# Patient Record
Sex: Male | Born: 1950 | Race: Black or African American | Hispanic: No | Smoking: Former smoker
Health system: Southern US, Community
[De-identification: ages and names within clinical notes are randomized; demographics above are authoritative.]

## PROBLEM LIST (undated history)

## (undated) DIAGNOSIS — D649 Anemia, unspecified: Secondary | ICD-10-CM

## (undated) DIAGNOSIS — C3490 Malignant neoplasm of unspecified part of unspecified bronchus or lung: Secondary | ICD-10-CM

## (undated) DIAGNOSIS — Z87891 Personal history of nicotine dependence: Secondary | ICD-10-CM

## (undated) DIAGNOSIS — R252 Cramp and spasm: Secondary | ICD-10-CM

## (undated) DIAGNOSIS — IMO0001 Reserved for inherently not codable concepts without codable children: Secondary | ICD-10-CM

## (undated) DIAGNOSIS — C801 Malignant (primary) neoplasm, unspecified: Secondary | ICD-10-CM

## (undated) DIAGNOSIS — C642 Malignant neoplasm of left kidney, except renal pelvis: Secondary | ICD-10-CM

## (undated) DIAGNOSIS — E1129 Type 2 diabetes mellitus with other diabetic kidney complication: Secondary | ICD-10-CM

## (undated) DIAGNOSIS — N183 Chronic kidney disease, stage 3 unspecified: Secondary | ICD-10-CM

## (undated) DIAGNOSIS — F1011 Alcohol abuse, in remission: Secondary | ICD-10-CM

## (undated) DIAGNOSIS — I10 Essential (primary) hypertension: Secondary | ICD-10-CM

## (undated) DIAGNOSIS — I499 Cardiac arrhythmia, unspecified: Secondary | ICD-10-CM

## (undated) DIAGNOSIS — E213 Hyperparathyroidism, unspecified: Secondary | ICD-10-CM

## (undated) DIAGNOSIS — R918 Other nonspecific abnormal finding of lung field: Secondary | ICD-10-CM

## (undated) DIAGNOSIS — E119 Type 2 diabetes mellitus without complications: Secondary | ICD-10-CM

## (undated) DIAGNOSIS — N529 Male erectile dysfunction, unspecified: Secondary | ICD-10-CM

## (undated) HISTORY — DX: Chronic kidney disease, stage 3 (moderate): N18.3

## (undated) HISTORY — DX: Other nonspecific abnormal finding of lung field: R91.8

## (undated) HISTORY — DX: Chronic kidney disease, stage 3 unspecified: N18.30

## (undated) HISTORY — DX: Type 2 diabetes mellitus with other diabetic kidney complication: E11.29

## (undated) HISTORY — PX: OTHER SURGICAL HISTORY: SHX169

## (undated) HISTORY — DX: Male erectile dysfunction, unspecified: N52.9

## (undated) HISTORY — DX: Type 2 diabetes mellitus without complications: E11.9

## (undated) HISTORY — DX: Alcohol abuse, in remission: F10.11

## (undated) HISTORY — DX: Essential (primary) hypertension: I10

## (undated) HISTORY — PX: ARM WOUND REPAIR / CLOSURE: SUR1141

## (undated) HISTORY — DX: Personal history of nicotine dependence: Z87.891

---

## 2003-01-07 ENCOUNTER — Emergency Department (HOSPITAL_COMMUNITY): Admission: EM | Admit: 2003-01-07 | Discharge: 2003-01-07 | Payer: Self-pay | Admitting: Emergency Medicine

## 2003-01-07 ENCOUNTER — Encounter: Payer: Self-pay | Admitting: Emergency Medicine

## 2003-04-16 ENCOUNTER — Encounter: Admission: RE | Admit: 2003-04-16 | Discharge: 2003-07-15 | Payer: Self-pay | Admitting: *Deleted

## 2003-05-08 ENCOUNTER — Ambulatory Visit (HOSPITAL_COMMUNITY): Admission: RE | Admit: 2003-05-08 | Discharge: 2003-05-08 | Payer: Self-pay | Admitting: *Deleted

## 2005-04-09 ENCOUNTER — Ambulatory Visit (HOSPITAL_COMMUNITY): Admission: RE | Admit: 2005-04-09 | Discharge: 2005-04-09 | Payer: Self-pay | Admitting: Gastroenterology

## 2005-04-09 LAB — HM COLONOSCOPY

## 2007-11-08 ENCOUNTER — Ambulatory Visit: Payer: Self-pay

## 2010-11-20 NOTE — Op Note (Signed)
NAME:  [name] [name], [name] [name]               ACCOUNT NO.:  [id number]   MEDICAL RECORD NO.:  [id number]          PATIENT TYPE:  AMB   LOCATION:  ENDO                         FACILITY:  [facility]   PHYSICIAN:  Nelwyn Salisbury, M.D.  DATE OF BIRTH:  [birth year]   DATE OF PROCEDURE:  04/09/2005  DATE OF DISCHARGE:                                 OPERATIVE REPORT   PROCEDURE PERFORMED:  Screening colonoscopy.   ENDOSCOPIST:  Nelwyn Salisbury, M.D.   INSTRUMENT USED:  Olympus video colonoscope.   INDICATIONS FOR PROCEDURE:  A 60 year old [place] American male with a  history of colon cancer in his father undergoing a screening colonoscopy,  rule out colonic polyps, masses, etc.   PREPROCEDURE PREPARATION:  Informed consent was procured from the patient.  The patient was fasted for eight hours prior to the procedure and prepped  with a bottle of magnesium citrate and a gallon of GoLYTELY the night prior  to the procedure.  Risks and benefits of the procedure including a 10% miss  rate of cancer and polyps was discussed with the patient as well.   PREPROCEDURE PHYSICAL:  VITAL SIGNS:  Stable vital signs.  NECK:  Supple.  CHEST:  Clear to auscultation.  CARDIOVASCULAR:  S1 and S2 regular.  ABDOMEN:  Soft with normal bowel sounds.   DESCRIPTION OF PROCEDURE:  The patient was placed in left lateral decubitus  position, sedated with 70 mg of Demerol and 10 mg of Versed in slow  incremental doses.  Once the patient was adequately sedated and maintained  on low flow oxygen and continuous cardiac monitoring, the Olympus video  colonoscope was advanced from the rectum to the cecum.  The appendiceal  orifice and ileocecal valve were visualized and photographed.  The patient  had a somewhat tortuous colon.  The patient's position was changed from the  left lateral to supine and the right lateral position.  With gentle  application of abdominal pressure, we reached the cecal base.  No other  masses, polyps,  erosions or ulcerations or diverticula  were seen.  Retroflexion in the rectum revealed no abnormalities.   IMPRESSION:  Normal colonoscopy up to the cecum.  No masses, polyps or  diverticula seen.   RECOMMENDATIONS:  1.  Continue high fiber diet with liberal fluid intake.  2.  Repeat colonoscopy in the next five years unless the patient develops      any abnormal symptoms in the interim.  3.  Outpatient follow-up as need arises in the future.      Nelwyn Salisbury, M.D.  Electronically Signed     JNM/MEDQ  D:  04/09/2005  T:  04/09/2005  Job:  [id number]   cc:   Early Chars. Waymon Amato, M.D.  Fax: [phone]

## 2011-04-13 LAB — LIPID PANEL
LDL Cholesterol: 78 mg/dL
Triglycerides: 134 mg/dL (ref 40–160)

## 2011-04-13 LAB — BASIC METABOLIC PANEL: Glucose: 148 mg/dL

## 2011-07-18 ENCOUNTER — Encounter: Payer: Self-pay | Admitting: Physician Assistant

## 2011-07-18 DIAGNOSIS — N529 Male erectile dysfunction, unspecified: Secondary | ICD-10-CM | POA: Insufficient documentation

## 2011-07-18 DIAGNOSIS — E1129 Type 2 diabetes mellitus with other diabetic kidney complication: Secondary | ICD-10-CM | POA: Insufficient documentation

## 2011-07-18 DIAGNOSIS — N183 Chronic kidney disease, stage 3 unspecified: Secondary | ICD-10-CM | POA: Insufficient documentation

## 2011-07-18 DIAGNOSIS — Z87891 Personal history of nicotine dependence: Secondary | ICD-10-CM | POA: Insufficient documentation

## 2011-08-03 ENCOUNTER — Other Ambulatory Visit: Payer: Self-pay | Admitting: Physician Assistant

## 2011-08-03 ENCOUNTER — Ambulatory Visit (INDEPENDENT_AMBULATORY_CARE_PROVIDER_SITE_OTHER): Payer: 59 | Admitting: Physician Assistant

## 2011-08-03 DIAGNOSIS — N183 Chronic kidney disease, stage 3 unspecified: Secondary | ICD-10-CM

## 2011-08-03 DIAGNOSIS — E1129 Type 2 diabetes mellitus with other diabetic kidney complication: Secondary | ICD-10-CM

## 2011-08-03 DIAGNOSIS — IMO0001 Reserved for inherently not codable concepts without codable children: Secondary | ICD-10-CM

## 2011-08-03 DIAGNOSIS — I10 Essential (primary) hypertension: Secondary | ICD-10-CM

## 2011-08-03 DIAGNOSIS — E782 Mixed hyperlipidemia: Secondary | ICD-10-CM

## 2011-08-03 LAB — COMPREHENSIVE METABOLIC PANEL
ALT: 20 U/L (ref 0–53)
AST: 20 U/L (ref 0–37)
Albumin: 4.4 g/dL (ref 3.5–5.2)
BUN: 27 mg/dL — ABNORMAL HIGH (ref 6–23)
CO2: 24 mEq/L (ref 19–32)
Calcium: 9.6 mg/dL (ref 8.4–10.5)
Chloride: 104 mEq/L (ref 96–112)
Potassium: 4.6 mEq/L (ref 3.5–5.3)

## 2011-08-03 LAB — LIPID PANEL
LDL Cholesterol: 91 mg/dL (ref 0–99)
VLDL: 26 mg/dL (ref 0–40)

## 2011-08-03 LAB — GLUCOSE, POCT (MANUAL RESULT ENTRY): POC Glucose: 209

## 2011-08-03 MED ORDER — SITAGLIPTIN PHOSPHATE 50 MG PO TABS: 50.0000 mg | ORAL_TABLET | Freq: Every day | ORAL | Status: DC

## 2011-08-03 MED ORDER — HYDROCHLOROTHIAZIDE 25 MG PO TABS: 25.0000 mg | ORAL_TABLET | Freq: Every day | ORAL | Status: DC

## 2011-08-03 MED ORDER — PRAVASTATIN SODIUM 40 MG PO TABS: 40.0000 mg | ORAL_TABLET | Freq: Every evening | ORAL | Status: DC

## 2011-08-03 MED ORDER — OLMESARTAN MEDOXOMIL 40 MG PO TABS: 40.0000 mg | ORAL_TABLET | Freq: Every day | ORAL | Status: DC

## 2011-08-03 MED ORDER — GLIPIZIDE ER 10 MG PO TB24: 10.0000 mg | ORAL_TABLET | Freq: Every day | ORAL | Status: DC

## 2011-08-03 MED ORDER — AMLODIPINE BESYLATE 10 MG PO TABS: 10.0000 mg | ORAL_TABLET | Freq: Every day | ORAL | Status: DC

## 2011-08-03 NOTE — Assessment & Plan Note (Signed)
A1C is above goal.  Once remaining lab results are available, will adjust regimen.  Given his current kidney function, he is on maximum oral agents.  We'll need to initiate insulin.  Plan: Lantus 10 u Cobb QPM, increasing by 2 units daily to achieve fasting glucose <140.

## 2011-08-03 NOTE — Assessment & Plan Note (Signed)
Needs improved control. Increase Benicar to 40 mg.

## 2011-08-03 NOTE — Assessment & Plan Note (Signed)
Continue efforts for optimal hypertension and diabetes control.

## 2011-08-03 NOTE — Progress Notes (Signed)
  Subjective:    [name], male    DOB: 1951/01/09, 61 y.o.   MRN: [id number]  Diabetes He presents for his follow-up diabetic visit. He has type 2 diabetes mellitus. No MedicAlert identification noted. His disease course has been improving. Hypoglycemia symptoms include sweats. Pertinent negatives for hypoglycemia include no confusion, dizziness, headaches, hunger, mood changes, nervousness/anxiousness, pallor, seizures, sleepiness, speech difficulty or tremors. There are no diabetic associated symptoms. There are no hypoglycemic complications. Symptoms are stable. Diabetic complications include impotence and nephropathy. Pertinent negatives for diabetic complications include no peripheral neuropathy or retinopathy. Risk factors for coronary artery disease include diabetes mellitus, dyslipidemia, hypertension, male sex, tobacco exposure and family history. Current diabetic treatment includes diet and oral agent (dual therapy). He is compliant with treatment most of the time. His weight is stable. When asked about meal planning, he reported none. He has not had a previous visit with a dietician. He never participates in exercise. There is no change in his home blood glucose trend. An ACE inhibitor/angiotensin II receptor blocker is being taken. He does not see a podiatrist.Eye exam is not current ("I'll have that done next time").  Does not check blood sugar at home. Home blood pressures are [id number] systolic at home. Has seen a dentist.  Planning multiple dental extractions.   Review of Systems  Constitutional: Negative.   HENT: Negative.   Eyes: Negative.   Respiratory: Negative.   Cardiovascular: Negative.   Gastrointestinal: Negative.   Genitourinary: Positive for impotence.  Musculoskeletal: Negative.   Skin: Negative for pallor.  Neurological: Negative for dizziness, tremors, seizures, speech difficulty and headaches.  Hematological: Negative.   Psychiatric/Behavioral:  Negative for confusion. The patient is not nervous/anxious.        Objective:   Physical Exam  Constitutional: He is oriented to person, place, and time. He appears well-developed and well-nourished. No distress.  HENT:  Head: Normocephalic and atraumatic.  Right Ear: External ear normal.  Left Ear: External ear normal.  Nose: Nose normal.  Mouth/Throat: Oropharynx is clear and moist. No oropharyngeal exudate.  Eyes: Conjunctivae and EOM are normal. Pupils are equal, round, and reactive to light. Right eye exhibits discharge. Left eye exhibits no discharge. No scleral icterus.  Neck: Normal range of motion. Neck supple. No thyromegaly present.  Cardiovascular: Normal rate, regular rhythm, normal heart sounds and intact distal pulses.  Exam reveals no gallop and no friction rub.   No murmur heard. Pulmonary/Chest: Effort normal and breath sounds normal. No respiratory distress. He has no wheezes. He has no rales. He exhibits no tenderness.  Musculoskeletal: He exhibits no edema.  Lymphadenopathy:    He has no cervical adenopathy.  Neurological: He is alert and oriented to person, place, and time. A sensory deficit (reduced sensation to monofilament RIGHT heel) is present.  Skin: Skin is warm and dry. He is not diaphoretic.  Psychiatric: He has a normal mood and affect. His behavior is normal.          Assessment & Plan:

## 2011-08-03 NOTE — Patient Instructions (Signed)
We need your blood pressure to be 120-140 or less (the top number) even at home, so we're increasing the dose of the Benicar from 20 mg to 40 mg.

## 2011-08-05 ENCOUNTER — Encounter: Payer: Self-pay | Admitting: Physician Assistant

## 2011-09-26 ENCOUNTER — Other Ambulatory Visit: Payer: Self-pay | Admitting: Physician Assistant

## 2011-10-26 ENCOUNTER — Ambulatory Visit (INDEPENDENT_AMBULATORY_CARE_PROVIDER_SITE_OTHER): Payer: 59 | Admitting: Physician Assistant

## 2011-10-26 ENCOUNTER — Encounter: Payer: Self-pay | Admitting: Physician Assistant

## 2011-10-26 VITALS — BP 168/78 | HR 72 | Temp 98.4°F | Resp 16 | Ht 70.0 in | Wt 168.8 lb

## 2011-10-26 DIAGNOSIS — E119 Type 2 diabetes mellitus without complications: Secondary | ICD-10-CM

## 2011-10-26 DIAGNOSIS — E1129 Type 2 diabetes mellitus with other diabetic kidney complication: Secondary | ICD-10-CM

## 2011-10-26 DIAGNOSIS — E785 Hyperlipidemia, unspecified: Secondary | ICD-10-CM

## 2011-10-26 DIAGNOSIS — I10 Essential (primary) hypertension: Secondary | ICD-10-CM

## 2011-10-26 DIAGNOSIS — N183 Chronic kidney disease, stage 3 unspecified: Secondary | ICD-10-CM

## 2011-10-26 LAB — COMPREHENSIVE METABOLIC PANEL WITH GFR
ALT: 18 U/L (ref 0–53)
AST: 24 U/L (ref 0–37)
Albumin: 4.2 g/dL (ref 3.5–5.2)
Alkaline Phosphatase: 55 U/L (ref 39–117)
BUN: 20 mg/dL (ref 6–23)
CO2: 24 meq/L (ref 19–32)
Calcium: 9.3 mg/dL (ref 8.4–10.5)
Chloride: 106 meq/L (ref 96–112)
Creat: 1.79 mg/dL — ABNORMAL HIGH (ref 0.50–1.35)
Glucose, Bld: 115 mg/dL — ABNORMAL HIGH (ref 70–99)
Potassium: 4.5 meq/L (ref 3.5–5.3)
Sodium: 138 meq/L (ref 135–145)
Total Bilirubin: 0.5 mg/dL (ref 0.3–1.2)
Total Protein: 7.2 g/dL (ref 6.0–8.3)

## 2011-10-26 LAB — LIPID PANEL
Cholesterol: 179 mg/dL (ref 0–200)
HDL: 35 mg/dL — ABNORMAL LOW (ref >39)
LDL Cholesterol: 125 mg/dL — ABNORMAL HIGH (ref 0–99)
Total CHOL/HDL Ratio: 5.1 ratio
Triglycerides: 97 mg/dL (ref <150)
VLDL: 19 mg/dL (ref 0–40)

## 2011-10-26 MED ORDER — PRAVASTATIN SODIUM 40 MG PO TABS: 40.0000 mg | ORAL_TABLET | Freq: Every evening | ORAL | Status: DC

## 2011-10-26 MED ORDER — OLMESARTAN MEDOXOMIL 40 MG PO TABS: 40.0000 mg | ORAL_TABLET | Freq: Every day | ORAL | Status: DC

## 2011-10-26 MED ORDER — HYDROCHLOROTHIAZIDE 25 MG PO TABS: 25.0000 mg | ORAL_TABLET | Freq: Every day | ORAL | Status: DC

## 2011-10-26 MED ORDER — GLUCOSE BLOOD VI STRP: ORAL_STRIP | Status: DC

## 2011-10-26 MED ORDER — AMLODIPINE BESYLATE 10 MG PO TABS: 10.0000 mg | ORAL_TABLET | Freq: Every day | ORAL | Status: DC

## 2011-10-26 NOTE — Assessment & Plan Note (Signed)
BP above goal today, likely due to being out of benicar x 3 months.  Stressed the importance of staying on his meds, even if he needs to call for a refill between visits.

## 2011-10-26 NOTE — Assessment & Plan Note (Signed)
Continue current treatment and plan, with stress again on good control of glucose and blood pressure. Continue evaluation with Dr. Moshe Cipro per her instructions.

## 2011-10-26 NOTE — Patient Instructions (Addendum)
Remember: You are what you eat, so don't be fast, cheap, easy or fake. Continue checking your blood sugar.  If it rises (above 140 fasting, or above 170 2-3 hours after your largest meal of the day, we'll restart a medication). You need an eye examination and a dental evaluation.  Please do those before you next visit with me.

## 2011-10-26 NOTE — Assessment & Plan Note (Addendum)
Well controlled.  He's been diligent about checking glucose at home.  Mostly fasting.  All readings less than 140, even during a two week period when he stopped both glipizide and Januvia!  He's been making extra efforts to eat healthier options and has lost weight.  Congratulated on the efforts and encouraged him to continue.  D/C glipizide and Januvia.  Continue to monitor glucose.  If it rises, we'll restart meds.  He does not want to restart Januvia, due to recent reports of increased cancer risk. He is reminded to see an eye specialist and a dentist before his next visit with me.

## 2011-10-26 NOTE — Progress Notes (Signed)
  Subjective:    [name], male    DOB: [birth year], 61 y.o.   MRN: [id number]  HPI  Presents for re-evaluation of DM type 2, HTN, elevated lipids, renal failure.  Has embarked on healthier eating and has controlled his blood sugar with it.  Even tried 2 weeks off the Januvia and Glipizide, with all fastings readings below 140!  He'd like to D/C the Januvia due to news reports of increased cancer risk.  BS QOAM. Less than 140. Last eye exam 4-5 years ago. Last dental exam about one year ago.   Review of Systems No chest pain, SOB, HA, dizziness, vision change, N/V, diarrhea, dysuria, myalgias, arthralgias or rash.     Objective:   Physical Exam        Assessment & Plan:

## 2011-10-27 LAB — MICROALBUMIN, URINE: Microalb, Ur: 11.09 mg/dL — ABNORMAL HIGH (ref 0.00–1.89)

## 2011-10-28 ENCOUNTER — Encounter: Payer: Self-pay | Admitting: Physician Assistant

## 2011-10-29 ENCOUNTER — Telehealth: Payer: Self-pay

## 2011-10-29 DIAGNOSIS — E119 Type 2 diabetes mellitus without complications: Secondary | ICD-10-CM

## 2011-10-29 NOTE — Telephone Encounter (Signed)
Please see the order sent yesterday.  Please resend or call in.

## 2011-10-29 NOTE — Telephone Encounter (Signed)
Pt said that [facility] did not get the RX for the glucose test strips. Please re-send.

## 2011-10-30 MED ORDER — GLUCOSE BLOOD VI STRP: ORAL_STRIP | Status: AC

## 2011-10-30 NOTE — Telephone Encounter (Signed)
Resent

## 2012-01-16 ENCOUNTER — Other Ambulatory Visit: Payer: Self-pay | Admitting: Physician Assistant

## 2012-02-01 ENCOUNTER — Encounter: Payer: Self-pay | Admitting: Physician Assistant

## 2012-02-01 ENCOUNTER — Ambulatory Visit (INDEPENDENT_AMBULATORY_CARE_PROVIDER_SITE_OTHER): Payer: 59 | Admitting: Physician Assistant

## 2012-02-01 VITALS — BP 140/70 | HR 79 | Temp 99.7°F | Resp 16 | Ht 70.0 in | Wt 167.2 lb

## 2012-02-01 DIAGNOSIS — N183 Chronic kidney disease, stage 3 unspecified: Secondary | ICD-10-CM

## 2012-02-01 DIAGNOSIS — E785 Hyperlipidemia, unspecified: Secondary | ICD-10-CM

## 2012-02-01 DIAGNOSIS — I10 Essential (primary) hypertension: Secondary | ICD-10-CM

## 2012-02-01 DIAGNOSIS — E782 Mixed hyperlipidemia: Secondary | ICD-10-CM

## 2012-02-01 DIAGNOSIS — L03811 Cellulitis of head [any part, except face]: Secondary | ICD-10-CM

## 2012-02-01 DIAGNOSIS — L03818 Cellulitis of other sites: Secondary | ICD-10-CM

## 2012-02-01 DIAGNOSIS — E1129 Type 2 diabetes mellitus with other diabetic kidney complication: Secondary | ICD-10-CM

## 2012-02-01 DIAGNOSIS — L02818 Cutaneous abscess of other sites: Secondary | ICD-10-CM

## 2012-02-01 LAB — POCT CBC
Granulocyte percent: 76.3 %G (ref 37–80)
Lymph, poc: 3.1 (ref 0.6–3.4)
MCH, POC: 27.3 pg (ref 27–31.2)
MCHC: 30.9 g/dL — AB (ref 31.8–35.4)
MCV: 88.4 fL (ref 80–97)
MID (cbc): 0.8 (ref 0–0.9)
POC MID %: 5 %M (ref 0–12)
Platelet Count, POC: 306 10*3/uL (ref 142–424)
RBC: 5.12 M/uL (ref 4.69–6.13)
RDW, POC: 14 %
WBC: 16.5 10*3/uL — AB (ref 4.6–10.2)

## 2012-02-01 LAB — COMPREHENSIVE METABOLIC PANEL
AST: 23 U/L (ref 0–37)
BUN: 36 mg/dL — ABNORMAL HIGH (ref 6–23)
CO2: 26 mEq/L (ref 19–32)
Calcium: 9.4 mg/dL (ref 8.4–10.5)
Chloride: 104 mEq/L (ref 96–112)
Creat: 2.03 mg/dL — ABNORMAL HIGH (ref 0.50–1.35)
Total Bilirubin: 0.6 mg/dL (ref 0.3–1.2)

## 2012-02-01 LAB — LIPID PANEL: LDL Cholesterol: 79 mg/dL (ref 0–99)

## 2012-02-01 LAB — POCT GLYCOSYLATED HEMOGLOBIN (HGB A1C): Hemoglobin A1C: 7.9

## 2012-02-01 MED ORDER — AMLODIPINE BESYLATE 10 MG PO TABS: 10.0000 mg | ORAL_TABLET | Freq: Every day | ORAL | Status: DC

## 2012-02-01 MED ORDER — HYDROCHLOROTHIAZIDE 25 MG PO TABS: 25.0000 mg | ORAL_TABLET | Freq: Every day | ORAL | Status: DC

## 2012-02-01 MED ORDER — PRAVASTATIN SODIUM 40 MG PO TABS: 40.0000 mg | ORAL_TABLET | Freq: Every day | ORAL | Status: DC

## 2012-02-01 MED ORDER — DOXYCYCLINE HYCLATE 100 MG PO CAPS: 100.0000 mg | ORAL_CAPSULE | Freq: Two times a day (BID) | ORAL | Status: AC

## 2012-02-01 MED ORDER — OLMESARTAN MEDOXOMIL 40 MG PO TABS: 40.0000 mg | ORAL_TABLET | Freq: Every day | ORAL | Status: DC

## 2012-02-01 NOTE — Progress Notes (Signed)
Subjective:    [name], male    DOB: 02-05-1951, 61 y.o.   MRN: [id number]  HPI  61 y.o. year old male presents for evaluation of diabetes type 2.  Prior to Admission medications   Medication Sig Start Date End Date Taking? Authorizing Provider  amLODipine (NORVASC) 10 MG tablet TAKE 1 TABLET DAILY [id number]  Yes Beatriz Chancellor, PA-C  aspirin 81 MG tablet Take 81 mg by mouth daily.   Yes Historical Provider, MD  BENICAR 40 MG tablet TAKE 1 TABLET DAILY [id number]  Yes Beatriz Chancellor, PA-C  fish oil-omega-3 fatty acids 1000 MG capsule Take 1 g by mouth daily.   Yes Historical Provider, MD  glucose blood test strip Use as instructed 10/30/11 10/29/12 Yes Willem Klingensmith S Griffith Santilli, PA-C  hydrochlorothiazide (HYDRODIURIL) 25 MG tablet Take 1 tablet (25 mg total) by mouth daily. 10/26/11  Yes Tammela Bales S Asaf Elmquist, PA-C  pravastatin (PRAVACHOL) 40 MG tablet TAKE 1 TABLET EVERY EVENING [id number]  Yes Beatriz Chancellor, PA-C    Allergies  Allergen Reactions  . Ace Inhibitors Hives    Checks home glucose QD-BID. Fasting glucose ranges from 150 to170. Post-prandial glucose: Doesn't check does experience symptoms with hypoglycemia. does perform daily foot exam. Last dental visit was last year. Last eye exam was "years ago."  It's on his list to schedule. has not received a pneumococcal vaccine. (repeatedly refuses despite urging) is current with influenza vaccination.   Review of Systems  Denies chest pain, shortness of breath, HA, dizziness, vision change, nausea, vomiting, diarrhea, constipation, melena, hematochezia, dysuria, increased urinary urgency or frequency, increased hunger or thirst, unintentional weight change, unexplained myalgias or arthralgias.  Past Medical History  Diagnosis Date  . Essential hypertension, benign   . Type II or unspecified type diabetes mellitus with renal manifestations, not stated as uncontrolled   . Chronic kidney disease, stage III (moderate)   .  History of tobacco abuse     quit 12/2008  . History of ETOH abuse     quit 1999  . Erectile dysfunction     Past Surgical History  Procedure Date  . Arm wound repair / closure     chainsaw; left forearm/wrist    Prior to Admission medications   Medication Sig Start Date End Date Taking? Authorizing Provider  amLODipine (NORVASC) 10 MG tablet TAKE 1 TABLET DAILY [id number]  Yes Beatriz Chancellor, PA-C  aspirin 81 MG tablet Take 81 mg by mouth daily.   Yes Historical Provider, MD  BENICAR 40 MG tablet TAKE 1 TABLET DAILY [id number]  Yes Beatriz Chancellor, PA-C  fish oil-omega-3 fatty acids 1000 MG capsule Take 1 g by mouth daily.   Yes Historical Provider, MD  glucose blood test strip Use as instructed 10/30/11 10/29/12 Yes [name], PA-C  hydrochlorothiazide (HYDRODIURIL) 25 MG tablet Take 1 tablet (25 mg total) by mouth daily. 10/26/11  Yes [name], PA-C  pravastatin (PRAVACHOL) 40 MG tablet TAKE 1 TABLET EVERY EVENING [id number]  Yes Beatriz Chancellor, PA-C    Allergies  Allergen Reactions  . Ace Inhibitors Hives    History   Social History  . Marital Status: Married    Spouse Name: [name]    Number of Children: 2  . Years of Education: 11   Occupational History  . retired     Location manager   Social History Main Topics  . Smoking status: Former Smoker -- 1.0 packs/day for 45 years  Types: Cigarettes    Quit date: 01/01/2009  . Smokeless tobacco: Not on file  . Alcohol Use: No  . Drug Use: No  . Sexually Active: Yes    Family History  Problem Relation Age of Onset  . Cancer Father   . Cancer Sister   . Stroke Brother   . Stroke Brother        Objective:   Physical Exam Blood pressure 140/70, pulse 79, temperature 99.7 F (37.6 C), temperature source Oral, resp. rate 16, height 5\' 10"  (1.778 m), weight 167 lb 3.2 oz (75.841 kg), SpO2 99.00%. Body mass index is 23.99 kg/(m^2). Well-developed, well nourished BM who is awake, alert and  oriented, in NAD. HEENT: [place]/AT, PERRL, EOMI.  Sclera and conjunctiva are clear.  EAC are patent, TMs are normal in appearance. Nasal mucosa is pink and moist. OP is clear. Neck: supple, non-tender, thyromegaly. Heart: RRR, no murmur Lungs: CTA Abdomen: normo-active bowel sounds, supple, non-tender, no mass or organomegaly. Extremities: no cyanosis, clubbing or edema. Skin: warm and dry.  Soft tissue swelling and mild erythema posterior left scalp.  Central excoriation, scant bloody drainage.  Very tender on palpation.  Left occipital lymph node palpable and tender.  Results for orders placed in visit on 02/01/12  GLUCOSE, POCT (MANUAL RESULT ENTRY)      Component Value Range   POC Glucose 154 (*) 70 - 99 mg/dl  POCT GLYCOSYLATED HEMOGLOBIN (HGB A1C)      Component Value Range   Hemoglobin A1C 7.9    POCT CBC      Component Value Range   WBC 16.5 (*) 4.6 - 10.2 K/uL   Lymph, poc 3.1  0.6 - 3.4   POC LYMPH PERCENT 18.7  10 - 50 %L   MID (cbc) 0.8  0 - 0.9   POC MID % 5.0  0 - 12 %M   POC Granulocyte 12.6 (*) 2 - 6.9   Granulocyte percent 76.3  37 - 80 %G   RBC 5.12  4.69 - 6.13 M/uL   Hemoglobin 14.0 (*) 14.1 - 18.1 g/dL   HCT, POC 45.3  43.5 - 53.7 %   MCV 88.4  80 - 97 fL   MCH, POC 27.3  27 - 31.2 pg   MCHC 30.9 (*) 31.8 - 35.4 g/dL   RDW, POC 14.0     Platelet Count, POC 306  142 - 424 K/uL   MPV 9.3  0 - 99.8 fL         Assessment & Plan:   1. Abscess or cellulitis of scalp  POCT CBC, doxycycline (VIBRAMYCIN) 100 MG capsule, Wound culture  2. DM (diabetes mellitus) type II controlled with renal manifestation  POCT glucose (manual entry), POCT glycosylated hemoglobin (Hb A1C)  3. HTN, goal below 130/80  olmesartan (BENICAR) 40 MG tablet, amLODipine (NORVASC) 10 MG tablet  5. Hyperlipidemia LDL goal < 70  pravastatin (PRAVACHOL) 40 MG tablet  6. Essential hypertension, benign  pravastatin (PRAVACHOL) 40 MG tablet, hydrochlorothiazide (HYDRODIURIL) 25 MG tablet,  Comprehensive metabolic panel  7. Mixed hyperlipidemia  Comprehensive metabolic panel, Lipid panel  8. CKD (chronic kidney disease) stage 3, GFR 30-59 ml/min  Comprehensive metabolic panel   Patient Instructions  Check your blood sugar once or twice daily.  Please collect some readings before you've had anything to eat or drink, and some readings 2-3 hours after your largest meal of the day.  Apply warm compresses to the wound on the back of your head.  If you are not having significant improvement in 48 hours, or if you are not completely well after completing the antibiotic, return for additional evaluation.   Re-evaluate chronic problems in 3 months.  If A1C remains above 7.5% will restart glipizide.

## 2012-02-01 NOTE — Patient Instructions (Addendum)
Check your blood sugar once or twice daily.  Please collect some readings before you've had anything to eat or drink, and some readings 2-3 hours after your largest meal of the day.  Apply warm compresses to the wound on the back of your head.  If you are not having significant improvement in 48 hours, or if you are not completely well after completing the antibiotic, return for additional evaluation.

## 2012-02-04 ENCOUNTER — Encounter: Payer: Self-pay | Admitting: *Deleted

## 2012-02-04 ENCOUNTER — Ambulatory Visit (INDEPENDENT_AMBULATORY_CARE_PROVIDER_SITE_OTHER): Payer: 59 | Admitting: Physician Assistant

## 2012-02-04 VITALS — BP 152/59 | HR 79 | Temp 98.2°F | Resp 16 | Ht 70.0 in | Wt 165.0 lb

## 2012-02-04 DIAGNOSIS — R51 Headache: Secondary | ICD-10-CM

## 2012-02-04 DIAGNOSIS — L723 Sebaceous cyst: Secondary | ICD-10-CM

## 2012-02-04 DIAGNOSIS — L02818 Cutaneous abscess of other sites: Secondary | ICD-10-CM

## 2012-02-04 DIAGNOSIS — L03811 Cellulitis of head [any part, except face]: Secondary | ICD-10-CM

## 2012-02-04 DIAGNOSIS — L03818 Cellulitis of other sites: Secondary | ICD-10-CM

## 2012-02-04 LAB — WOUND CULTURE
Gram Stain: NONE SEEN
Gram Stain: NONE SEEN

## 2012-02-04 MED ORDER — HYDROCODONE-ACETAMINOPHEN 5-325 MG PO TABS: 1.0000 | ORAL_TABLET | Freq: Four times a day (QID) | ORAL | Status: AC | PRN

## 2012-02-04 NOTE — Progress Notes (Signed)
  Subjective:    [name], male    DOB: 04/18/1951, 61 y.o.   MRN: [id number]  HPI  This 61 y.o. Male presents for evaluation of a cellulitis on the back of his scalp.  02/01/2012 at his appointment for routine follow-up he was also evaluated for this problem.  He had a small crusted pustule, but minimal fluid could be expressed.  He was started on Doxycycline and warm compresses.  He returns today because while his head and scalp pain is improved, he was developed another, larger pustule and the area of swelling on the back of his head is larger.  He's tolerating the doxycycline.  No fever, chills.   Review of Systems As above.    Objective:   Physical Exam Vitals noted.  He is A&Ox3.  Posterior scalp with swelling and significant tenderness surrounding a cluster of pustules, one of which was unroofed 4 days ago, and a larger one superior to it.  He has occipital and left cervical lymphadenopathy.  After cleansing with alcohol, the pustules were unroofed and purulence was expressed from the original site as well as the larger lesion above it.  Additionally, sebaceous material was expressed from the lower site.  Both were irrigated with 2% lidocaine plain, which revealed communication between the two lesions. A scant amount of 1/4 inch packing was placed in the lower wound and the entire area covered with mupirocin ointment and covered with a dressing.      Assessment & Plan:   1. Sebaceous cyst  Wound culture  2. Headache  Wound culture, HYDROcodone-acetaminophen (NORCO) 5-325 MG per tablet  3. Abscess or cellulitis of scalp     Patient Instructions  Continue with the warm compresses for 20 minutes 3-4 times each day. Continue the Doxycycline. Use the pain medication as needed.   RTC for wound care tomorrow with Windell Hummingbird, PA-C

## 2012-02-04 NOTE — Patient Instructions (Signed)
Continue with the warm compresses for 20 minutes 3-4 times each day. Continue the Doxycycline. Use the pain medication as needed.

## 2012-02-05 ENCOUNTER — Ambulatory Visit (INDEPENDENT_AMBULATORY_CARE_PROVIDER_SITE_OTHER): Payer: 59 | Admitting: Physician Assistant

## 2012-02-05 VITALS — BP 118/60 | HR 88 | Temp 98.7°F | Resp 16 | Ht 70.0 in | Wt 165.0 lb

## 2012-02-05 DIAGNOSIS — L03811 Cellulitis of head [any part, except face]: Secondary | ICD-10-CM

## 2012-02-05 DIAGNOSIS — IMO0001 Reserved for inherently not codable concepts without codable children: Secondary | ICD-10-CM

## 2012-02-05 DIAGNOSIS — L03818 Cellulitis of other sites: Secondary | ICD-10-CM

## 2012-02-05 DIAGNOSIS — L02818 Cutaneous abscess of other sites: Secondary | ICD-10-CM

## 2012-02-05 DIAGNOSIS — E119 Type 2 diabetes mellitus without complications: Secondary | ICD-10-CM

## 2012-02-05 NOTE — Progress Notes (Signed)
  Subjective:    [name], male    DOB: 01/20/1951, 61 y.o.   MRN: [id number]  HPI Pt presents to clinic for recheck.  His wound is sore and he had trouble sleeping because of the pain.  He is tolerating the abx ok.  He has not changed the drsg.  Pt is not checking his glucose at home.   Review of Systems     Objective:   Physical Exam  Constitutional: He is oriented to person, place, and time. He appears well-developed and well-nourished.  HENT:  Head: Normocephalic and atraumatic.  Right Ear: External ear normal.  Left Ear: External ear normal.  Eyes: Conjunctivae are normal.  Pulmonary/Chest: Effort normal.  Neurological: He is alert and oriented to person, place, and time.  Skin: Skin is warm and dry.     Psychiatric: He has a normal mood and affect. His behavior is normal. Judgment and thought content normal.    Results for orders placed in visit on 02/05/12  GLUCOSE, POCT (MANUAL RESULT ENTRY)      Component Value Range   POC Glucose 267 (*) 70 - 99 mg/dl         Assessment & Plan:   1. Cellulitis of scalp  POCT glucose (manual entry)  2. DM (diabetes mellitus)  POCT glucose (manual entry)   1- pt to continue abx.  He is to change the drsg tomorrow and wife is to look at marked area and if area of erythema or induration is larger he is to RTC tomorrow for recheck, if the area has improved he can recheck in 2 days.  I think we need to keep close watch on this due to the location of the infection.  Pt to use warm compresses.  Pt to monitor his glucose at home because today's reading seems higher than usual for him.  Pt understood and voiced agreement with the following.

## 2012-02-07 ENCOUNTER — Ambulatory Visit (INDEPENDENT_AMBULATORY_CARE_PROVIDER_SITE_OTHER): Payer: 59 | Admitting: Physician Assistant

## 2012-02-07 VITALS — BP 138/60 | HR 79 | Temp 97.7°F | Resp 16 | Ht 71.0 in | Wt 165.0 lb

## 2012-02-07 DIAGNOSIS — L089 Local infection of the skin and subcutaneous tissue, unspecified: Secondary | ICD-10-CM

## 2012-02-07 DIAGNOSIS — L02818 Cutaneous abscess of other sites: Secondary | ICD-10-CM

## 2012-02-07 DIAGNOSIS — L03811 Cellulitis of head [any part, except face]: Secondary | ICD-10-CM

## 2012-02-07 DIAGNOSIS — R51 Headache: Secondary | ICD-10-CM

## 2012-02-07 DIAGNOSIS — L723 Sebaceous cyst: Secondary | ICD-10-CM

## 2012-02-07 DIAGNOSIS — R519 Headache, unspecified: Secondary | ICD-10-CM

## 2012-02-07 NOTE — Patient Instructions (Addendum)
Take the doxycycline WITH FOOD so it doesn't make you so sick. Continue applying a warm compress 3 times daily. Change the dressing as needed, but do not pill out the string of packing that is tucked into the wound. Return to see me on Wednesday 02/09/2012 at 1 pm

## 2012-02-07 NOTE — Progress Notes (Signed)
Subjective:    [name], male    DOB: March 30, 1951, 61 y.o.   MRN: [id number]  HPI This 61 y.o. Male presents for wound care of infected sebaceous cyst on the posterior scalp.  It appears he initially had a sebaceous cyst, as sebaceous material has been expressed from one of the openings, but the multiple other site are draining purulent material.  He's having less pain since his visit on 8/03, but is now experiencing nausea with doxycycline.  He hasn't taking this morning's dose yet. No fever, chills, HA.    Review of Systems As above.  He notes his blood sugars have been elevated, in the 200's.   Past Medical History  Diagnosis Date  . Essential hypertension, benign   . Type II or unspecified type diabetes mellitus with renal manifestations, not stated as uncontrolled   . Chronic kidney disease, stage III (moderate)   . History of tobacco abuse     quit 12/2008  . History of ETOH abuse     quit 1999  . Erectile dysfunction     Past Surgical History  Procedure Date  . Arm wound repair / closure     chainsaw; left forearm/wrist    Prior to Admission medications   Medication Sig Start Date End Date Taking? Authorizing Provider  amLODipine (NORVASC) 10 MG tablet Take 1 tablet (10 mg total) by mouth daily. 02/01/12  Yes Zakry Caso S Lando Alcalde, PA-C  aspirin 81 MG tablet Take 81 mg by mouth daily.   Yes Historical Provider, MD  doxycycline (VIBRAMYCIN) 100 MG capsule Take 1 capsule (100 mg total) by mouth 2 (two) times daily. 02/01/12 02/11/12 Yes Clemma Johnsen S Halima Fogal, PA-C  fish oil-omega-3 fatty acids 1000 MG capsule Take 1 g by mouth daily.   Yes Historical Provider, MD  glucose blood test strip Use as instructed 10/30/11 10/29/12 Yes Eytan Carrigan S Cheick Suhr, PA-C  hydrochlorothiazide (HYDRODIURIL) 25 MG tablet Take 1 tablet (25 mg total) by mouth daily. 02/01/12  Yes Philo Kurtz S Destin Kittler, PA-C  HYDROcodone-acetaminophen (NORCO) 5-325 MG per tablet Take 1 tablet by mouth every 6 (six) hours  as needed for pain. 02/04/12 02/14/12 Yes Kanisha Duba S Keyante Durio, PA-C  olmesartan (BENICAR) 40 MG tablet Take 1 tablet (40 mg total) by mouth daily. 02/01/12  Yes Bobbijo Holst S Shaarav Ripple, PA-C  pravastatin (PRAVACHOL) 40 MG tablet Take 1 tablet (40 mg total) by mouth daily. 02/01/12  Yes Shaul Trautman Janalee Dane, PA-C    Allergies  Allergen Reactions  . Ace Inhibitors Hives    History   Social History  . Marital Status: Married    Spouse Name: [name]    Number of Children: 2  . Years of Education: 11   Occupational History  . retired     Location manager   Social History Main Topics  . Smoking status: Former Smoker -- 1.0 packs/day for 45 years    Types: Cigarettes    Quit date: 01/01/2009  . Smokeless tobacco: Not on file  . Alcohol Use: No  . Drug Use: No  . Sexually Active: Yes    Family History  Problem Relation Age of Onset  . Cancer Father   . Cancer Sister   . Stroke Brother   . Stroke Brother        Objective:   Physical Exam Blood pressure 138/60, pulse 79, temperature 97.7 F (36.5 C), temperature source Oral, resp. rate 16, height 5\' 11"  (1.803 m), weight 165 lb (74.844 kg), SpO2 98.00%. Body  mass index is 23.01 kg/(m^2). Well-developed, well nourished BM who is awake, alert and oriented, in NAD. HEENT: [place]/AT, PERRL, EOMI.  Sclera and conjunctiva are clear.  Posterior scalp is remarkable for a large lump.  Dressing removed to reveal thick yellow drainage.  Once cleansed with saline and gauze, the cluster of draining wounds noted as before.  The original wound has sebaceous material at the opening, and more is expressed with gentle pressure, along with purulence from the other openings.  The area of erythema marked on 8/03 is much smaller, but there is a firm area inferior to the draining sites.   Neck: supple, non-tender, shotty lymphadenopathy, no thyromegaly.  With permission, the area of the draining pustules was anesthetized with 2 cc of 2% lidocaine and prepped.  A  15 blade was used to incise the skin between the two largest draining sites (attempted packing of these 8/02 and know to communicate).  Deep in the skin was a cavity with large purulence and a large sebaceous cyst sac.  The cavity was then irrigated and packed with 1/4 inch plain packing.  A dressing was placed with some difficulty, given the hair growth in the area.      Assessment & Plan:   1. Cellulitis of scalp   2. Scalp pain   3. Infected sebaceous cyst of skin    Patient Instructions  Take the doxycycline WITH FOOD so it doesn't make you so sick. Continue applying a warm compress 3 times daily. Change the dressing as needed, but do not pill out the string of packing that is tucked into the wound. Return to see me on Wednesday 02/09/2012 at 1 pm

## 2012-02-09 ENCOUNTER — Ambulatory Visit (INDEPENDENT_AMBULATORY_CARE_PROVIDER_SITE_OTHER): Payer: 59 | Admitting: Physician Assistant

## 2012-02-09 ENCOUNTER — Encounter: Payer: Self-pay | Admitting: Physician Assistant

## 2012-02-09 VITALS — BP 116/75 | HR 69 | Temp 98.0°F | Resp 16 | Ht 70.0 in | Wt 165.0 lb

## 2012-02-09 DIAGNOSIS — L089 Local infection of the skin and subcutaneous tissue, unspecified: Secondary | ICD-10-CM

## 2012-02-09 DIAGNOSIS — L723 Sebaceous cyst: Secondary | ICD-10-CM

## 2012-02-09 DIAGNOSIS — L03811 Cellulitis of head [any part, except face]: Secondary | ICD-10-CM

## 2012-02-09 DIAGNOSIS — L02818 Cutaneous abscess of other sites: Secondary | ICD-10-CM

## 2012-02-09 NOTE — Patient Instructions (Signed)
Continue the Doxycycline and warm compresses.  I'll see you back on Friday, between 11 am and 6 pm.

## 2012-02-09 NOTE — Progress Notes (Signed)
Subjective:    [name], male    DOB: 31-Aug-1950, 61 y.o.   MRN: [id number]  HPI This 61 y.o. Male presents for wound care of infected sebaceous cyst on the posterior scalp. Initially he had drainage from multiple sites, and I&D was performed 02/07/2012 with removal of purulence, sebaceous material and a very large cyst sac.  He's had a big reduction in his discomfort, is tolerating the doxycycline, and has had no additional fever.  Review of Systems As above.  He notes his blood sugars have been elevated, in the 200's.   Past Medical History  Diagnosis Date  . Essential hypertension, benign   . Type II or unspecified type diabetes mellitus with renal manifestations, not stated as uncontrolled   . Chronic kidney disease, stage III (moderate)   . History of tobacco abuse     quit 12/2008  . History of ETOH abuse     quit 1999  . Erectile dysfunction     Past Surgical History  Procedure Date  . Arm wound repair / closure     chainsaw; left forearm/wrist    Prior to Admission medications   Medication Sig Start Date End Date Taking? Authorizing Provider  amLODipine (NORVASC) 10 MG tablet Take 1 tablet (10 mg total) by mouth daily. 02/01/12  Yes Rorik Vespa S Jaqueline Uber, PA-C  aspirin 81 MG tablet Take 81 mg by mouth daily.   Yes Historical Provider, MD  doxycycline (VIBRAMYCIN) 100 MG capsule Take 1 capsule (100 mg total) by mouth 2 (two) times daily. 02/01/12 02/11/12 Yes Griffith Santilli S Michal Callicott, PA-C  fish oil-omega-3 fatty acids 1000 MG capsule Take 1 g by mouth daily.   Yes Historical Provider, MD  glucose blood test strip Use as instructed 10/30/11 10/29/12 Yes Gaby Harney S Annastacia Duba, PA-C  hydrochlorothiazide (HYDRODIURIL) 25 MG tablet Take 1 tablet (25 mg total) by mouth daily. 02/01/12  Yes Cheney Ewart S Searcy Miyoshi, PA-C  HYDROcodone-acetaminophen (NORCO) 5-325 MG per tablet Take 1 tablet by mouth every 6 (six) hours as needed for pain. 02/04/12 02/14/12 Yes Alante Tolan S Braylee Bosher, PA-C  olmesartan  (BENICAR) 40 MG tablet Take 1 tablet (40 mg total) by mouth daily. 02/01/12  Yes Luby Seamans S Dayani Winbush, PA-C  pravastatin (PRAVACHOL) 40 MG tablet Take 1 tablet (40 mg total) by mouth daily. 02/01/12  Yes Cordarrell Sane Janalee Dane, PA-C    Allergies  Allergen Reactions  . Ace Inhibitors Hives    History   Social History  . Marital Status: Married    Spouse Name: [name]    Number of Children: 2  . Years of Education: 11   Occupational History  . retired     Location manager   Social History Main Topics  . Smoking status: Former Smoker -- 1.0 packs/day for 45 years    Types: Cigarettes    Quit date: 01/01/2009  . Smokeless tobacco: Not on file  . Alcohol Use: No  . Drug Use: No  . Sexually Active: Yes    Family History  Problem Relation Age of Onset  . Cancer Father   . Cancer Sister   . Stroke Brother   . Stroke Brother        Objective:      Blood pressure 116/75, pulse 69, temperature 98 F (36.7 C), temperature source Oral, resp. rate 16, height 5\' 10"  (1.778 m), weight 165 lb (74.844 kg), SpO2 99.00%. Body mass index is 23.68 kg/(m^2). Well-developed, well nourished BM who is awake, alert and oriented, in  NAD.  Dressing and packing removed.  Small pieces of cyst sac material removed with pickups.  Additional purulence visible within the wound cavity, but not expressed with pressure.  Irrigated with 3 cc of 2% lidocaine plain.  Packed with 1/4 inch plain packing.  Dressed.  Assessment & Plan:   1. Abscess or cellulitis of scalp   2. Infected sebaceous cyst    Patient Instructions  Continue the Doxycycline and warm compresses.  I'll see you back on Friday, between 11 am and 6 pm.

## 2012-02-11 ENCOUNTER — Ambulatory Visit (INDEPENDENT_AMBULATORY_CARE_PROVIDER_SITE_OTHER): Payer: 59 | Admitting: Physician Assistant

## 2012-02-11 VITALS — BP 107/57 | HR 68 | Temp 98.9°F | Resp 16

## 2012-02-11 DIAGNOSIS — L03818 Cellulitis of other sites: Secondary | ICD-10-CM

## 2012-02-11 DIAGNOSIS — L723 Sebaceous cyst: Secondary | ICD-10-CM

## 2012-02-11 DIAGNOSIS — L02818 Cutaneous abscess of other sites: Secondary | ICD-10-CM

## 2012-02-11 NOTE — Progress Notes (Signed)
  Subjective:    [name], male    DOB: Aug 02, 1950, 61 y.o.   MRN: [id number]  HPI  Presents for wound care after I&D of an infected sebaceous cyst on the back of the scalp.  He's had significant improvement since the procedure 02/07/2012.  No pain, tolerating Doxy with food.  No fever. Daily dressing change at home.  Blood sugars are returning to normal.  Review of Systems As above.    Objective:   Physical Exam  Blood pressure 107/57, pulse 68, temperature 98.9 F (37.2 C), resp. rate 16. There is no height or weight on file to calculate BMI. Well-developed, well nourished BM who is awake, alert and oriented, in NAD. Lungs: normal effort Skin: Dressing and packing removed.  Inferior induration significantly diminished from previous visit. Scant purulence expressed from below and to the right of the incision. Irrigated with 3 cc of 2% lidocaine plain.  Loosely repacked with 1/4 inch plain packing.  Dressed.     Assessment & Plan:   1. Cellulitis and abscess of other specified site, s/p I&D 02/07/2012  2. Sebaceous cyst    Continue Doxycycline and dressing changes.  RTC 48 hours for wound care, with Ms. Marte, then with Ms. Marte and Ms. Weber during my absence next week.

## 2012-02-13 ENCOUNTER — Ambulatory Visit (INDEPENDENT_AMBULATORY_CARE_PROVIDER_SITE_OTHER): Payer: 59 | Admitting: Physician Assistant

## 2012-02-13 VITALS — BP 124/71 | HR 70 | Temp 98.0°F | Resp 18 | Ht 71.0 in | Wt 166.0 lb

## 2012-02-13 DIAGNOSIS — L02818 Cutaneous abscess of other sites: Secondary | ICD-10-CM

## 2012-02-13 DIAGNOSIS — L723 Sebaceous cyst: Secondary | ICD-10-CM

## 2012-02-13 DIAGNOSIS — L03811 Cellulitis of head [any part, except face]: Secondary | ICD-10-CM

## 2012-02-13 NOTE — Progress Notes (Signed)
[name] MRN: [id number], DOB: 1950-08-29 61 y.o. Date of Encounter: 02/13/2012, 8:29 AM  Chief Complaint: Wound care   See previous note  HPI: 61 y.o. y/o male presents for wound care s/p I&D on 02/01/12. Doing well No issues or complaints Afebrile/ no chills No nausea or vomiting Completed doxycycline.  No pain. Daily dressing change Previous note reviewed  Past Medical History  Diagnosis Date  . Essential hypertension, benign   . Type II or unspecified type diabetes mellitus with renal manifestations, not stated as uncontrolled   . Chronic kidney disease, stage III (moderate)   . History of tobacco abuse     quit 12/2008  . History of ETOH abuse     quit 1999  . Erectile dysfunction      Home Meds: Prior to Admission medications   Medication Sig Start Date End Date Taking? Authorizing Provider  amLODipine (NORVASC) 10 MG tablet Take 1 tablet (10 mg total) by mouth daily. 02/01/12  Yes Chelle S Jeffery, PA-C  aspirin 81 MG tablet Take 81 mg by mouth daily.   Yes Historical Provider, MD  fish oil-omega-3 fatty acids 1000 MG capsule Take 1 g by mouth daily.   Yes Historical Provider, MD  glucose blood test strip Use as instructed 10/30/11 10/29/12 Yes Chelle S Jeffery, PA-C  hydrochlorothiazide (HYDRODIURIL) 25 MG tablet Take 1 tablet (25 mg total) by mouth daily. 02/01/12  Yes Chelle S Jeffery, PA-C  HYDROcodone-acetaminophen (NORCO) 5-325 MG per tablet Take 1 tablet by mouth every 6 (six) hours as needed for pain. 02/04/12 02/14/12 Yes Chelle S Jeffery, PA-C  olmesartan (BENICAR) 40 MG tablet Take 1 tablet (40 mg total) by mouth daily. 02/01/12  Yes Chelle S Jeffery, PA-C  pravastatin (PRAVACHOL) 40 MG tablet Take 1 tablet (40 mg total) by mouth daily. 02/01/12  Yes Chelle Janalee Dane, PA-C    Allergies:  Allergies  Allergen Reactions  . Ace Inhibitors Hives    ROS: Constitutional: Afebrile, no chills Cardiovascular: negative for chest pain or  palpitations Dermatological: Positive for wound. Negative for erythema, pain, or warmth.  GI: No nausea or vomiting   EXAM: Physical Exam: Blood pressure 124/71, pulse 70, temperature 98 F (36.7 C), temperature source Oral, resp. rate 18, height 5\' 11"  (1.803 m), weight 166 lb (75.297 kg), SpO2 99.00%., Body mass index is 23.15 kg/(m^2). General: Well developed, well nourished, in no acute distress. Nontoxic appearing. Head: Normocephalic, atraumatic, sclera non-icteric.  Neck: Supple. Lungs: Breathing is unlabored. Heart: Normal rate. Skin:  Warm and moist. Dressing and packing in place. No induration, erythema, or tenderness to palpation. Neuro: Alert and oriented X 3. Moves all extremities spontaneously. Normal gait.  Psych:  Responds to questions appropriately with a normal affect.       PROCEDURE: Dressing and packing removed. No purulence expressed. Minimal amount of purulence on bandage and packing.  Wound bed healthy Irrigated with 2% plain lidocaine 5 cc. Repacked with 1/4 packing plain. Dressing applied  LAB: Culture:   A/P: 61 y.o. y/o male with scalp cellulitis/abscess as above s/p I&D on Wound care per above Pain well controlled Daily dressing changes Recheck 48 hours  Signed, Georgiann Mccoy, PA-C 02/13/2012 8:29 AM

## 2012-02-15 ENCOUNTER — Ambulatory Visit (INDEPENDENT_AMBULATORY_CARE_PROVIDER_SITE_OTHER): Payer: 59 | Admitting: Physician Assistant

## 2012-02-15 VITALS — BP 120/64 | HR 66 | Temp 98.7°F | Resp 20 | Ht 71.0 in | Wt 164.4 lb

## 2012-02-15 DIAGNOSIS — R51 Headache: Secondary | ICD-10-CM

## 2012-02-15 DIAGNOSIS — L723 Sebaceous cyst: Secondary | ICD-10-CM

## 2012-02-15 DIAGNOSIS — R519 Headache, unspecified: Secondary | ICD-10-CM

## 2012-02-15 DIAGNOSIS — L089 Local infection of the skin and subcutaneous tissue, unspecified: Secondary | ICD-10-CM

## 2012-02-15 NOTE — Progress Notes (Signed)
  Subjective:    [name], male    DOB: [birth year], 61 y.o.   MRN: [id number]  HPI Pt presents to clinic for recheck.  The pain has significantly improved.  He is changing the drsg daily.   Review of Systems     Objective:   Physical Exam  Drsg and packing removed.  No purulence expressed.  No induration or erythema around wound site.  Irrigated with 2% lido and repacked with 1/4 in plain packing.  Pt tolerated well.  Drsg replaced.      Assessment & Plan:  PT to continue daily drsg changes at home.  Recheck here in 3 days.

## 2012-02-18 ENCOUNTER — Ambulatory Visit (INDEPENDENT_AMBULATORY_CARE_PROVIDER_SITE_OTHER): Payer: 59 | Admitting: Physician Assistant

## 2012-02-18 VITALS — BP 119/68 | HR 62 | Temp 98.0°F | Resp 16

## 2012-02-18 DIAGNOSIS — L723 Sebaceous cyst: Secondary | ICD-10-CM

## 2012-02-18 DIAGNOSIS — L089 Local infection of the skin and subcutaneous tissue, unspecified: Secondary | ICD-10-CM

## 2012-02-18 NOTE — Progress Notes (Signed)
  Subjective:    [name], male    DOB: 09/26/1950, 61 y.o.   MRN: [id number]  HPI Pt presents to clinic for recheck of wound.  He is having no problems with it and changing the drsg daily.   Review of Systems     Objective:   Physical Exam  Vitals reviewed. Constitutional: He is oriented to person, place, and time. He appears well-developed and well-nourished.  HENT:  Head: Normocephalic and atraumatic.  Right Ear: External ear normal.  Left Ear: External ear normal.  Pulmonary/Chest: Effort normal.  Neurological: He is alert and oriented to person, place, and time.  Skin: Skin is warm and dry.       Drsg and packing are removed.  No induration or erythema, no purulence expressed.  Repacked with 1/4in packing about 1 cm.  Drsg replaced.  Psychiatric: He has a normal mood and affect. His behavior is normal. Judgment and thought content normal.          Assessment & Plan:   1. Infected sebaceous cyst of skin    Continue daily drsg change.  Recheck in 5 days.  We are at the stage where we are just waiting for the area to heal.

## 2012-02-22 ENCOUNTER — Ambulatory Visit (INDEPENDENT_AMBULATORY_CARE_PROVIDER_SITE_OTHER): Payer: 59 | Admitting: Physician Assistant

## 2012-02-22 VITALS — BP 116/60 | HR 62 | Temp 98.0°F | Resp 16 | Ht 69.5 in | Wt 164.2 lb

## 2012-02-22 DIAGNOSIS — L723 Sebaceous cyst: Secondary | ICD-10-CM

## 2012-02-22 NOTE — Progress Notes (Signed)
  Subjective:    [name], male    DOB: [birth year], 61 y.o.   MRN: [id number]  HPI Presents for wound care.  He developed an infected sebaceous cyst on the back of the scalp that was I&D'd on 02/07/2012.  He has completed a course of doxycycline and tolerated dressing changes well.  He has no pain at the site.   Review of Systems     Objective:   Physical Exam Blood pressure 116/60, pulse 62, temperature 98 F (36.7 C), temperature source Oral, resp. rate 16, height 5' 9.5" (1.765 m), weight 164 lb 3.2 oz (74.481 kg), SpO2 100.00%. Body mass index is 23.90 kg/(m^2). Well-developed, well nourished BM who is awake, alert and oriented, in NAD. Skin: warm and dry.  Dressing and packing removed.  No additional purulence expressed.  Non-tender.  No erythema or edema or induration.  Wound bed is granulating well.  No additional packing required.     Assessment & Plan:   1. Sebaceous cyst    Follow-up regarding chronic medical problems as planned.

## 2012-05-09 ENCOUNTER — Encounter: Payer: Self-pay | Admitting: Physician Assistant

## 2012-05-09 ENCOUNTER — Ambulatory Visit (INDEPENDENT_AMBULATORY_CARE_PROVIDER_SITE_OTHER): Payer: 59 | Admitting: Physician Assistant

## 2012-05-09 VITALS — BP 130/62 | HR 64 | Temp 98.4°F | Resp 16 | Ht 70.0 in | Wt 166.0 lb

## 2012-05-09 DIAGNOSIS — N183 Chronic kidney disease, stage 3 unspecified: Secondary | ICD-10-CM

## 2012-05-09 DIAGNOSIS — E1129 Type 2 diabetes mellitus with other diabetic kidney complication: Secondary | ICD-10-CM

## 2012-05-09 DIAGNOSIS — Z23 Encounter for immunization: Secondary | ICD-10-CM

## 2012-05-09 DIAGNOSIS — I10 Essential (primary) hypertension: Secondary | ICD-10-CM

## 2012-05-09 DIAGNOSIS — IMO0001 Reserved for inherently not codable concepts without codable children: Secondary | ICD-10-CM

## 2012-05-09 LAB — COMPREHENSIVE METABOLIC PANEL
Albumin: 4.4 g/dL (ref 3.5–5.2)
BUN: 28 mg/dL — ABNORMAL HIGH (ref 6–23)
CO2: 28 mEq/L (ref 19–32)
Calcium: 9.6 mg/dL (ref 8.4–10.5)
Chloride: 103 mEq/L (ref 96–112)
Creat: 1.99 mg/dL — ABNORMAL HIGH (ref 0.50–1.35)
Glucose, Bld: 128 mg/dL — ABNORMAL HIGH (ref 70–99)
Potassium: 4.6 mEq/L (ref 3.5–5.3)

## 2012-05-09 LAB — LIPID PANEL
Cholesterol: 148 mg/dL (ref 0–200)
HDL: 31 mg/dL — ABNORMAL LOW (ref >39)
Triglycerides: 130 mg/dL (ref <150)

## 2012-05-09 LAB — POCT GLYCOSYLATED HEMOGLOBIN (HGB A1C): Hemoglobin A1C: 8.3

## 2012-05-09 NOTE — Patient Instructions (Signed)
Please schedule with your eye specialist.

## 2012-05-09 NOTE — Assessment & Plan Note (Signed)
A1C elevated today, not unexepectedly.  Anticipate improvement now that he is back on glipizide.  I've continued to encourage him to have a pneumococcal vaccine, but he consistently declines, stating he'll agree once he turns 63. Continue follow-up with Dr. Moshe Cipro at [facility] per her recommendations.

## 2012-05-09 NOTE — Progress Notes (Signed)
Subjective:    [name], male    DOB: 1950-08-28, 61 y.o.   MRN: [id number]  HPI This 61 y.o. male presents for evaluation of DM, CKD, hyperlipidemia.  He reports feeling great. [place] his visit this morning.  Frequency of home glucose monitoring: 2x/week since starting back on glipizide Sees a dentist Q12 months, doesn't see eye specialist regularly. Checks feet intermittently. Is current with influenza vaccine. Is not current with pneumococcal vaccine.  He agrees to take it upon his 63th birthday.  Review of Systems Denies chest pain, shortness of breath, HA, dizziness, vision change, nausea, vomiting, diarrhea, constipation, melena, hematochezia, dysuria, increased urinary urgency or frequency, increased hunger or thirst, unintentional weight change, unexplained myalgias or arthralgias, rash.  Past Medical History  Diagnosis Date  . Essential hypertension, benign   . Type II or unspecified type diabetes mellitus with renal manifestations, not stated as uncontrolled(250.40)   . Chronic kidney disease, stage III (moderate)   . History of tobacco abuse     quit 12/2008  . History of ETOH abuse     quit 1999  . Erectile dysfunction     Past Surgical History  Procedure Date  . Arm wound repair / closure     chainsaw; left forearm/wrist    Prior to Admission medications   Medication Sig Start Date End Date Taking? Authorizing Provider  amLODipine (NORVASC) 10 MG tablet Take 1 tablet (10 mg total) by mouth daily. 02/01/12  Yes Akif Weldy S Karoline Fleer, PA-C  aspirin 81 MG tablet Take 81 mg by mouth daily.   Yes Historical Provider, MD  fish oil-omega-3 fatty acids 1000 MG capsule Take 1 g by mouth daily.   Yes Historical Provider, MD  glipiZIDE (GLUCOTROL XL) 10 MG 24 hr tablet Take 10 mg by mouth daily.   Yes Historical Provider, MD  glucose blood test strip Use as instructed 10/30/11 10/29/12 Yes Bri Wakeman S Malashia Kamaka,  PA-C  hydrochlorothiazide (HYDRODIURIL) 25 MG tablet Take 1 tablet (25 mg total) by mouth daily. 02/01/12  Yes Cheyane Ayon S Claramae Rigdon, PA-C  olmesartan (BENICAR) 40 MG tablet Take 1 tablet (40 mg total) by mouth daily. 02/01/12  Yes Lenee Franze S Eulalio Reamy, PA-C  pravastatin (PRAVACHOL) 40 MG tablet Take 1 tablet (40 mg total) by mouth daily. 02/01/12  Yes Erby Sanderson S Jamara Vary, PA-C  sitaGLIPtin (JANUVIA) 50 MG tablet Take 50 mg by mouth daily.   Yes Historical Provider, MD    Allergies  Allergen Reactions  . Ace Inhibitors Hives    History   Social History  . Marital Status: Married    Spouse Name: [name]    Number of Children: 2  . Years of Education: 11   Occupational History  . retired     Location manager   Social History Main Topics  . Smoking status: Former Smoker -- 1.0 packs/day for 45 years    Types: Cigarettes    Quit date: 01/01/2009  . Smokeless tobacco: Not on file  . Alcohol Use: No  . Drug Use: No  . Sexually Active: Yes -- Male partner(s)   Other Topics Concern  . Not on file   Social History Narrative   Lives with wife.    Family History  Problem Relation Age of Onset  . Cancer Father   . Cancer Sister   . Stroke Brother   . Stroke Brother        Objective:  Physical Exam  Blood pressure 130/62, pulse 64, temperature 98.4 F (36.9 C), temperature source Oral, resp. rate 16, height 5\' 10"  (1.778 m), weight 166 lb (75.297 kg), SpO2 100.00%. Body mass index is 23.82 kg/(m^2). Well-developed, well nourished BM who is awake, alert and oriented, in NAD. HEENT: [place]/AT, sclera and conjunctiva are clear.   Neck: supple, non-tender, no lymphadenopathy, thyromegaly. Heart: RRR, no murmur Lungs: normal effort, CTA Extremities: no cyanosis, clubbing or edema. See DM foot exam. Skin: warm and dry without rash. Psychologic: good mood and appropriate affect, normal speech and behavior.  Results for orders placed in visit on 05/09/12  GLUCOSE, POCT (MANUAL  RESULT ENTRY)      Component Value Range   POC Glucose 132 (*) 70 - 99 mg/dl  POCT GLYCOSYLATED HEMOGLOBIN (HGB A1C)      Component Value Range   Hemoglobin A1C 8.3        Assessment & Plan:   1. Type II or unspecified type diabetes mellitus with renal manifestations, not stated as uncontrolled  POCT glucose (manual entry), POCT glycosylated hemoglobin (Hb A1C), Comprehensive metabolic panel, Lipid panel  2. Need for prophylactic vaccination and inoculation against influenza  Flu vaccine greater than or equal to 3yo preservative free IM  3. HTN, goal below 130/80  Comprehensive metabolic panel  4. Chronic kidney disease, stage III (moderate)  Comprehensive metabolic panel

## 2012-05-10 ENCOUNTER — Encounter: Payer: Self-pay | Admitting: Physician Assistant

## 2012-06-29 DIAGNOSIS — N183 Chronic kidney disease, stage 3 unspecified: Secondary | ICD-10-CM

## 2012-08-15 ENCOUNTER — Encounter: Payer: Self-pay | Admitting: Physician Assistant

## 2012-08-15 ENCOUNTER — Ambulatory Visit (INDEPENDENT_AMBULATORY_CARE_PROVIDER_SITE_OTHER): Payer: 59 | Admitting: Physician Assistant

## 2012-08-15 VITALS — BP 140/72 | HR 77 | Temp 97.5°F | Resp 16 | Ht 70.0 in | Wt 172.0 lb

## 2012-08-15 DIAGNOSIS — E785 Hyperlipidemia, unspecified: Secondary | ICD-10-CM

## 2012-08-15 DIAGNOSIS — I10 Essential (primary) hypertension: Secondary | ICD-10-CM

## 2012-08-15 DIAGNOSIS — N183 Chronic kidney disease, stage 3 unspecified: Secondary | ICD-10-CM

## 2012-08-15 DIAGNOSIS — E1129 Type 2 diabetes mellitus with other diabetic kidney complication: Secondary | ICD-10-CM

## 2012-08-15 LAB — LIPID PANEL
Cholesterol: 160 mg/dL (ref 0–200)
HDL: 31 mg/dL — ABNORMAL LOW (ref >39)

## 2012-08-15 LAB — COMPREHENSIVE METABOLIC PANEL
AST: 19 U/L (ref 0–37)
Albumin: 4.3 g/dL (ref 3.5–5.2)
BUN: 38 mg/dL — ABNORMAL HIGH (ref 6–23)
CO2: 24 mEq/L (ref 19–32)
Calcium: 9.4 mg/dL (ref 8.4–10.5)
Chloride: 104 mEq/L (ref 96–112)
Glucose, Bld: 171 mg/dL — ABNORMAL HIGH (ref 70–99)
Potassium: 4.5 mEq/L (ref 3.5–5.3)

## 2012-08-15 LAB — GLUCOSE, POCT (MANUAL RESULT ENTRY): POC Glucose: 163 mg/dl — AB (ref 70–99)

## 2012-08-15 MED ORDER — PRAVASTATIN SODIUM 40 MG PO TABS: 40.0000 mg | ORAL_TABLET | Freq: Every day | ORAL | Status: DC

## 2012-08-15 MED ORDER — OLMESARTAN MEDOXOMIL 40 MG PO TABS: 40.0000 mg | ORAL_TABLET | Freq: Every day | ORAL | Status: DC

## 2012-08-15 MED ORDER — AMLODIPINE BESYLATE 10 MG PO TABS: 10.0000 mg | ORAL_TABLET | Freq: Every day | ORAL | Status: DC

## 2012-08-15 MED ORDER — HYDROCHLOROTHIAZIDE 25 MG PO TABS: 25.0000 mg | ORAL_TABLET | Freq: Every day | ORAL | Status: DC

## 2012-08-15 NOTE — Progress Notes (Signed)
Subjective:    [name], male    DOB: 02-Sep-1950, 62 y.o.   MRN: [id number]  HPI This 61 y.o. male presents for evaluation of DM, CKD stage 3, HTN. Last visit 3 months ago. Denies new problems or concerns. Frequency of home glucose monitoring: 2-3 times each week; 90's-120's Sees eye specialist annually (06/2012). Has not seen a dentist in years, but agrees to go.  Still hasn't scheduled. Checks feet daily. Is current with influenza vaccine. Is not current with pneumococcal vaccine. Agrees to get it upon turning 63.   Past Medical History  Diagnosis Date  . Essential hypertension, benign   . Type II or unspecified type diabetes mellitus with renal manifestations, not stated as uncontrolled(250.40)   . Chronic kidney disease, stage III (moderate)   . History of tobacco abuse     quit 12/2008  . History of ETOH abuse     quit 1999  . Erectile dysfunction     Past Surgical History  Procedure Laterality Date  . Arm wound repair / closure      chainsaw; left forearm/wrist    Prior to Admission medications   Medication Sig Start Date End Date Taking? Authorizing Provider  amLODipine (NORVASC) 10 MG tablet Take 1 tablet (10 mg total) by mouth daily. 02/01/12  Yes Beautifull Cisar S Skylee Baird, PA-C  aspirin 81 MG tablet Take 81 mg by mouth daily.   Yes Historical Provider, MD  fish oil-omega-3 fatty acids 1000 MG capsule Take 1 g by mouth daily.   Yes Historical Provider, MD  glipiZIDE (GLUCOTROL XL) 10 MG 24 hr tablet Take 10 mg by mouth daily.   Yes Historical Provider, MD  glucose blood test strip Use as instructed 10/30/11 10/29/12 Yes Liba Hulsey S Klea Nall, PA-C  hydrochlorothiazide (HYDRODIURIL) 25 MG tablet Take 1 tablet (25 mg total) by mouth daily. 02/01/12  Yes Nell Schrack S Donat Humble, PA-C  olmesartan (BENICAR) 40 MG tablet Take 1 tablet (40 mg total) by mouth daily. 02/01/12  Yes Ysabel Cowgill S Lorma Heater, PA-C  pravastatin (PRAVACHOL) 40 MG tablet Take 1 tablet (40 mg total) by mouth  daily. 02/01/12  Yes Laiklynn Raczynski S Jamilee Lafosse, PA-C  sitaGLIPtin (JANUVIA) 50 MG tablet Take 50 mg by mouth daily.   Yes Historical Provider, MD    Allergies  Allergen Reactions  . Ace Inhibitors Hives    History   Social History  . Marital Status: Married    Spouse Name: [name]    Number of Children: 2  . Years of Education: 11   Occupational History  . retired     Location manager   Social History Main Topics  . Smoking status: Former Smoker -- 1.00 packs/day for 45 years    Types: Cigarettes    Quit date: 01/01/2009  . Smokeless tobacco: Never Used  . Alcohol Use: No  . Drug Use: No  . Sexually Active: Yes -- Male partner(s)   Other Topics Concern  . Not on file   Social History Narrative   Lives with wife.    Family History  Problem Relation Age of Onset  . Cancer Father     colon  . Cancer Sister     Breast  . Stroke Brother   . Stroke Brother      Review of Systems Denies chest pain, shortness of breath, HA, dizziness, vision change, nausea, vomiting, diarrhea, constipation, melena, hematochezia, dysuria, increased urinary urgency or frequency, increased hunger or thirst, unintentional weight change, unexplained myalgias or arthralgias, rash.  Objective:   Physical Exam  Blood pressure 140/72, pulse 77, temperature 97.5 F (36.4 C), temperature source Oral, resp. rate 16, height 5\' 10"  (1.778 m), weight 172 lb (78.019 kg). Body mass index is 24.68 kg/(m^2). Well-developed, well nourished BM who is awake, alert and oriented, in NAD. HEENT: [place]/AT,sclera and conjunctiva are clear.   Neck: supple, non-tender, no lymphadenopathy, thyromegaly. Heart: RRR, no murmur Lungs: normal effort, CTA Extremities: no cyanosis, clubbing or edema. Skin: warm and dry without rash. Psychologic: good mood and appropriate affect, normal speech and behavior. See DM foot exam     Assessment & Plan:   1. HTN, goal below 130/80  olmesartan (BENICAR) 40 MG tablet    amLODipine (NORVASC) 10 MG tablet  2. Type II or unspecified type diabetes mellitus with renal manifestations, not stated as uncontrolled(250.40)  POCT glucose (manual entry)   POCT glycosylated hemoglobin (Hb A1C)   Comprehensive metabolic panel  3. Chronic kidney disease, stage III (moderate)    4. Hyperlipidemia LDL goal < 70  Lipid panel   pravastatin (PRAVACHOL) 40 MG tablet  5. Essential hypertension, benign  hydrochlorothiazide (HYDRODIURIL) 25 MG tablet

## 2012-08-15 NOTE — Assessment & Plan Note (Addendum)
Apparent control.  Await A1C. Again encouraged him to schedule with a dentist.  Again he refuses pneumococcal vaccine, though he agrees to getting it upon his 63th birthday.

## 2012-08-15 NOTE — Assessment & Plan Note (Signed)
Continue control of HTN and DM.

## 2012-08-15 NOTE — Assessment & Plan Note (Signed)
Better control than recently, and likely higher at present given some stress this morning related to his vehicle.  Continue current treatment.

## 2012-08-15 NOTE — Patient Instructions (Signed)
Keep up the great work! I'll need a urine specimen at your next visit. Make an appointment with a dentist!

## 2012-08-16 ENCOUNTER — Encounter: Payer: Self-pay | Admitting: Physician Assistant

## 2012-08-26 ENCOUNTER — Other Ambulatory Visit: Payer: Self-pay | Admitting: Physician Assistant

## 2012-11-21 ENCOUNTER — Ambulatory Visit (INDEPENDENT_AMBULATORY_CARE_PROVIDER_SITE_OTHER): Payer: 59 | Admitting: Physician Assistant

## 2012-11-21 ENCOUNTER — Encounter: Payer: Self-pay | Admitting: Physician Assistant

## 2012-11-21 VITALS — BP 134/67 | HR 79 | Temp 98.0°F | Resp 16 | Ht 70.5 in | Wt 171.0 lb

## 2012-11-21 DIAGNOSIS — N183 Chronic kidney disease, stage 3 unspecified: Secondary | ICD-10-CM

## 2012-11-21 DIAGNOSIS — E785 Hyperlipidemia, unspecified: Secondary | ICD-10-CM

## 2012-11-21 DIAGNOSIS — I10 Essential (primary) hypertension: Secondary | ICD-10-CM

## 2012-11-21 DIAGNOSIS — Z1159 Encounter for screening for other viral diseases: Secondary | ICD-10-CM

## 2012-11-21 DIAGNOSIS — E1129 Type 2 diabetes mellitus with other diabetic kidney complication: Secondary | ICD-10-CM

## 2012-11-21 LAB — COMPREHENSIVE METABOLIC PANEL
Alkaline Phosphatase: 57 U/L (ref 39–117)
BUN: 36 mg/dL — ABNORMAL HIGH (ref 6–23)
CO2: 28 mEq/L (ref 19–32)
Creat: 2.27 mg/dL — ABNORMAL HIGH (ref 0.50–1.35)
Glucose, Bld: 138 mg/dL — ABNORMAL HIGH (ref 70–99)
Sodium: 139 mEq/L (ref 135–145)
Total Bilirubin: 0.6 mg/dL (ref 0.3–1.2)
Total Protein: 7.6 g/dL (ref 6.0–8.3)

## 2012-11-21 LAB — LIPID PANEL
Cholesterol: 146 mg/dL (ref 0–200)
HDL: 30 mg/dL — ABNORMAL LOW (ref >39)
Triglycerides: 149 mg/dL (ref <150)
VLDL: 30 mg/dL (ref 0–40)

## 2012-11-21 LAB — GLUCOSE, POCT (MANUAL RESULT ENTRY): POC Glucose: 149 mg/dL — AB (ref 70–99)

## 2012-11-21 MED ORDER — SITAGLIPTIN PHOSPHATE 50 MG PO TABS: 50.0000 mg | ORAL_TABLET | Freq: Every day | ORAL | Status: DC

## 2012-11-21 MED ORDER — GLIPIZIDE ER 10 MG PO TB24: 10.0000 mg | ORAL_TABLET | Freq: Every day | ORAL | Status: DC

## 2012-11-21 MED ORDER — PRAVASTATIN SODIUM 40 MG PO TABS: 40.0000 mg | ORAL_TABLET | Freq: Every day | ORAL | Status: DC

## 2012-11-21 MED ORDER — HYDROCHLOROTHIAZIDE 25 MG PO TABS: 25.0000 mg | ORAL_TABLET | Freq: Every day | ORAL | Status: DC

## 2012-11-21 MED ORDER — AMLODIPINE BESYLATE 10 MG PO TABS: 10.0000 mg | ORAL_TABLET | Freq: Every day | ORAL | Status: DC

## 2012-11-21 MED ORDER — OLMESARTAN MEDOXOMIL 40 MG PO TABS: 40.0000 mg | ORAL_TABLET | Freq: Every day | ORAL | Status: DC

## 2012-11-21 NOTE — Patient Instructions (Addendum)
Please schedule with a dentist!

## 2012-11-21 NOTE — Progress Notes (Signed)
  Subjective:    [name], male    DOB: [birth year], 62 y.o.   MRN: [id number]  HPI This 62 y.o. male presents for evaluation of DM type 2, HTN and elevated lipids. Ran out of meds about a week ago.  Frequency of home glucose monitoring: doesn't check regularly Does not see a dentist or eye specialist regularly. Checks feet daily. Is current with influenza vaccine. Is not current with pneumococcal vaccine.  Past medical history, surgical history, family history, social history and problem list reviewed.  Review of Systems Denies chest pain, shortness of breath, HA, dizziness, vision change, nausea, vomiting, diarrhea, constipation, melena, hematochezia, dysuria, increased urinary urgency or frequency, increased hunger or thirst, unintentional weight change, rash.  Some intermittent pain in the RIGHT heel when ambulates without shoes.  Resolves with putting his shoes back on.     Objective:   Physical Exam Blood pressure 134/67, pulse 79, temperature 98 F (36.7 C), resp. rate 16, height 5' 10.5" (1.791 m), weight 171 lb (77.565 kg). Body mass index is 24.18 kg/(m^2). Well-developed, well nourished BM who is awake, alert and oriented, in NAD. HEENT: [place]/AT, sclera and conjunctiva are clear.   Neck: supple, non-tender, no lymphadenopathy, thyromegaly. Heart: RRR, no murmur Lungs: normal effort, CTA Extremities: no cyanosis, clubbing or edema. Skin: warm and dry without rash. Psychologic: good mood and appropriate affect, normal speech and behavior.  See DM foot exam.    Assessment & Plan:  Type II or unspecified type diabetes mellitus with renal manifestations, not stated as uncontrolled(250.40) - Plan: glipiZIDE (GLUCOTROL XL) 10 MG 24 hr tablet, sitaGLIPtin (JANUVIA) 50 MG tablet, POCT glucose (manual entry), POCT glycosylated hemoglobin (Hb A1C), Microalbumin, urine  Chronic kidney disease, stage III (moderate) - Plan: Comprehensive metabolic panel  HTN, goal  below 130/80 - Plan: amLODipine (NORVASC) 10 MG tablet, hydrochlorothiazide (HYDRODIURIL) 25 MG tablet, olmesartan (BENICAR) 40 MG tablet, pravastatin (PRAVACHOL) 40 MG tablet  Hyperlipidemia LDL goal < 70 - Plan: pravastatin (PRAVACHOL) 40 MG tablet, Lipid panel  Need for hepatitis C screening test - Plan: Hepatitis C antibody  RTC 3 months, sooner if needed.  See DDS. Will get pneumococcal and shingles vaccines upon turning 63.  Fara Chute, PA-C Physician Assistant-Certified [facility]

## 2013-02-27 ENCOUNTER — Encounter: Payer: Self-pay | Admitting: Physician Assistant

## 2013-02-27 ENCOUNTER — Ambulatory Visit (INDEPENDENT_AMBULATORY_CARE_PROVIDER_SITE_OTHER): Payer: 59 | Admitting: Physician Assistant

## 2013-02-27 VITALS — BP 146/70 | HR 64 | Temp 97.4°F | Resp 16 | Ht 70.5 in | Wt 173.0 lb

## 2013-02-27 DIAGNOSIS — I10 Essential (primary) hypertension: Secondary | ICD-10-CM

## 2013-02-27 DIAGNOSIS — N183 Chronic kidney disease, stage 3 unspecified: Secondary | ICD-10-CM

## 2013-02-27 DIAGNOSIS — E785 Hyperlipidemia, unspecified: Secondary | ICD-10-CM

## 2013-02-27 DIAGNOSIS — E1129 Type 2 diabetes mellitus with other diabetic kidney complication: Secondary | ICD-10-CM

## 2013-02-27 LAB — GLUCOSE, POCT (MANUAL RESULT ENTRY): POC Glucose: 131 mg/dl — AB (ref 70–99)

## 2013-02-27 LAB — COMPREHENSIVE METABOLIC PANEL
ALT: 23 U/L (ref 0–53)
AST: 22 U/L (ref 0–37)
Albumin: 4.4 g/dL (ref 3.5–5.2)
BUN: 32 mg/dL — ABNORMAL HIGH (ref 6–23)
CO2: 26 mEq/L (ref 19–32)
Calcium: 9.2 mg/dL (ref 8.4–10.5)
Chloride: 106 mEq/L (ref 96–112)
Potassium: 4.5 mEq/L (ref 3.5–5.3)

## 2013-02-27 LAB — LIPID PANEL: Cholesterol: 110 mg/dL (ref 0–200)

## 2013-02-27 NOTE — Progress Notes (Signed)
  Subjective:    [name], male    DOB: April 07, 1951, 62 y.o.   MRN: [id number]  HPI This 62 y.o. male presents for evaluation of DM type 2 with microalbuminuria, CKD stage III, HTN.  Frequency of home glucose monitoring: doesn't check regularly  Does not see a dentist or eye specialist regularly, despite urging.  Checks feet daily.  Is current (2013)with influenza vaccine.  Is not current with pneumococcal vaccine. Will return in a few weeks for this season's flu vaccine and plans to get the pneumococcal vaccine at age 84 (08/2013). Will get pneumococcal and shingles vaccines upon turning 63 as well.     Review of Systems Denies chest pain, shortness of breath, HA, dizziness, vision change, nausea, vomiting, diarrhea, constipation, melena, hematochezia, dysuria, increased urinary urgency or frequency, increased hunger or thirst, unintentional weight change, unexplained myalgias or arthralgias, rash.     Objective:   Physical Exam Blood pressure 146/70, pulse 64, temperature 97.4 F (36.3 C), resp. rate 16, height 5' 10.5" (1.791 m), weight 173 lb (78.472 kg). Body mass index is 24.46 kg/(m^2). Well-developed, well nourished BM who is awake, alert and oriented, in NAD. HEENT: [place]/AT, sclera and conjunctiva are clear.   Neck: supple, non-tender, no lymphadenopathy, thyromegaly. Heart: RRR, no murmur Lungs: normal effort, CTA Extremities: no cyanosis, clubbing or edema. Skin: warm and dry without rash. Psychologic: good mood and appropriate affect, normal speech and behavior.  See DM foot exam.   Results for orders placed in visit on 02/27/13  GLUCOSE, POCT (MANUAL RESULT ENTRY)      Result Value Range   POC Glucose 131 (*) 70 - 99 mg/dl  POCT GLYCOSYLATED HEMOGLOBIN (HGB A1C)      Result Value Range   Hemoglobin A1C 7.0         Assessment & Plan:  Type II or unspecified type diabetes mellitus with renal manifestations, not stated as uncontrolled(250.40) -  Plan: POCT glucose (manual entry), POCT glycosylated hemoglobin (Hb A1C)  Other and unspecified hyperlipidemia - Plan: Lipid panel  HTN, goal below 130/80 - Plan: Comprehensive metabolic panel  Chronic kidney disease, stage III (moderate)  Congratulated on improved glucose control and encouraged him to keep working on healthy eating and regular exercise.  Await lab results.  RTC 3 months.  Fara Chute, PA-C Physician Assistant-Certified [facility]

## 2013-02-27 NOTE — Patient Instructions (Signed)
Keep up the great work!  I will contact you with your lab results as soon as they are available.   If you have not heard from me in 2 weeks, please contact me.  The fastest way to get your results is to register for My Chart (see the instructions on the last page of this printout).

## 2013-02-28 ENCOUNTER — Encounter: Payer: Self-pay | Admitting: Physician Assistant

## 2013-03-08 ENCOUNTER — Ambulatory Visit (INDEPENDENT_AMBULATORY_CARE_PROVIDER_SITE_OTHER): Payer: 59 | Admitting: *Deleted

## 2013-03-08 DIAGNOSIS — Z23 Encounter for immunization: Secondary | ICD-10-CM

## 2013-05-10 ENCOUNTER — Telehealth: Payer: Self-pay

## 2013-05-10 NOTE — Telephone Encounter (Signed)
Received PA request for pt's Januvia stating that ins prefers Onglyza or Tradjenta. I looked through his chart and it looks like Januvia was added to glipizide therapy on 06/18/10. I don't see that he ever tried either of the preferred. Do you want to change or is there a contraindication of trying one of them that I can use to do a PA?

## 2013-05-10 NOTE — Telephone Encounter (Signed)
Let's switch to onglyza 2.5 mg 1 PO QD. Please send in, and notify patient of the change.

## 2013-05-11 ENCOUNTER — Encounter: Payer: Self-pay | Admitting: Physician Assistant

## 2013-05-11 DIAGNOSIS — N183 Chronic kidney disease, stage 3 unspecified: Secondary | ICD-10-CM

## 2013-05-11 DIAGNOSIS — E785 Hyperlipidemia, unspecified: Secondary | ICD-10-CM | POA: Insufficient documentation

## 2013-05-11 MED ORDER — SAXAGLIPTIN HCL 2.5 MG PO TABS: 2.5000 mg | ORAL_TABLET | Freq: Every day | ORAL | Status: DC

## 2013-05-11 NOTE — Telephone Encounter (Signed)
Sent in new Rx and notified pharm replaces the [place]. LMOM for pt to CB to explain change.

## 2013-05-15 NOTE — Telephone Encounter (Signed)
Called pt back and explained med change and instr's for use. He agreed he will finish the Januvia he has left and then start on Onglyza, while continuing his glipizide. Advised pt NOT to take both the [place] together. Also advised pt to monitor BS carefully after the change and let us know if BS does not remain controlled or if he has any SEs from the [place]. Pt agreed.

## 2013-05-18 ENCOUNTER — Other Ambulatory Visit: Payer: Self-pay | Admitting: Physician Assistant

## 2013-06-05 ENCOUNTER — Ambulatory Visit (INDEPENDENT_AMBULATORY_CARE_PROVIDER_SITE_OTHER): Payer: 59 | Admitting: Physician Assistant

## 2013-06-05 ENCOUNTER — Encounter: Payer: Self-pay | Admitting: Physician Assistant

## 2013-06-05 VITALS — BP 138/64 | HR 71 | Temp 97.8°F | Resp 16 | Ht 70.5 in | Wt 172.0 lb

## 2013-06-05 DIAGNOSIS — E785 Hyperlipidemia, unspecified: Secondary | ICD-10-CM

## 2013-06-05 DIAGNOSIS — N189 Chronic kidney disease, unspecified: Secondary | ICD-10-CM

## 2013-06-05 DIAGNOSIS — N183 Chronic kidney disease, stage 3 unspecified: Secondary | ICD-10-CM

## 2013-06-05 DIAGNOSIS — I10 Essential (primary) hypertension: Secondary | ICD-10-CM

## 2013-06-05 DIAGNOSIS — I129 Hypertensive chronic kidney disease with stage 1 through stage 4 chronic kidney disease, or unspecified chronic kidney disease: Secondary | ICD-10-CM

## 2013-06-05 DIAGNOSIS — E119 Type 2 diabetes mellitus without complications: Secondary | ICD-10-CM

## 2013-06-05 DIAGNOSIS — E1129 Type 2 diabetes mellitus with other diabetic kidney complication: Secondary | ICD-10-CM

## 2013-06-05 LAB — COMPREHENSIVE METABOLIC PANEL
AST: 22 U/L (ref 0–37)
Albumin: 4.2 g/dL (ref 3.5–5.2)
BUN: 41 mg/dL — ABNORMAL HIGH (ref 6–23)
Calcium: 9.6 mg/dL (ref 8.4–10.5)
Chloride: 105 mEq/L (ref 96–112)
Potassium: 4.3 mEq/L (ref 3.5–5.3)
Sodium: 138 mEq/L (ref 135–145)
Total Bilirubin: 0.5 mg/dL (ref 0.3–1.2)

## 2013-06-05 LAB — GLUCOSE, POCT (MANUAL RESULT ENTRY): POC Glucose: 128 mg/dl — AB (ref 70–99)

## 2013-06-05 MED ORDER — PRAVASTATIN SODIUM 40 MG PO TABS: 40.0000 mg | ORAL_TABLET | Freq: Every day | ORAL | Status: DC

## 2013-06-05 MED ORDER — GLIPIZIDE ER 10 MG PO TB24: 10.0000 mg | ORAL_TABLET | Freq: Every day | ORAL | Status: DC

## 2013-06-05 MED ORDER — GLUCOSE BLOOD VI STRP: ORAL_STRIP | Status: DC

## 2013-06-05 NOTE — Patient Instructions (Signed)
Keep up the great work. At your next visit, we'll need a urine specimen, and will give you the pneumococcal vaccine and a prescription for the shingles vaccine. After your birthday, you also need an eye exam and a dental visit.

## 2013-06-05 NOTE — Progress Notes (Signed)
   Subjective:    Patient ID: BURLIE ROSCHER, male    DOB: 1950/12/12, 62 y.o.   MRN: [id number]  HPI This 62 y.o. male presents for evaluation of DM type 2, HTN, hyperlipidemia, CKD.  He's doing well, tolerating medications without difficulty.  Frequency of home glucose monitoring: QAM, <140 since change from Januvia to Onglyza. Does not see a dentist or eye specialist regularly, despite urging. Plans to schedule once he turns 63. Checks feet daily.  Is current with influenza vaccine.  Is not current with pneumococcal vaccine.  Plans to get the pneumococcal vaccine at age 33 (08/2013). Will get shingles vaccine upon turning 63 as well.  Medications, allergies, past medical history, surgical history, family history, social history and problem list reviewed and updated.  Review of Systems Denies chest pain, shortness of breath, HA, dizziness, vision change, nausea, vomiting, diarrhea, constipation, melena, hematochezia, dysuria, increased urinary urgency or frequency, increased hunger or thirst, unintentional weight change, unexplained myalgias or arthralgias, rash.     Objective:   Physical Exam  Blood pressure 138/64, pulse 71, temperature 97.8 F (36.6 C), temperature source Oral, resp. rate 16, height 5' 10.5" (1.791 m), weight 172 lb (78.019 kg), SpO2 100.00%. Body mass index is 24.32 kg/(m^2). Well-developed, well nourished BM who is awake, alert and oriented, in NAD. HEENT: [place]/AT, sclera and conjunctiva are clear.   Neck: supple, non-tender, no lymphadenopathy, thyromegaly. Heart: RRR, no murmur Lungs: normal effort, CTA Extremities: no cyanosis, clubbing or edema. Skin: warm and dry without rash. Psychologic: good mood and appropriate affect, normal speech and behavior.  See DM foot exam-normal.     Assessment & Plan:  Type II or unspecified type diabetes mellitus with renal manifestations, not stated as uncontrolled(250.40)  - Plan: HM Diabetes Foot Exam, glucose blood  test strip, POCT glucose (manual entry), POCT glycosylated hemoglobin (Hb A1C), Comprehensive metabolic panel; glipiZIDE (GLUCOTROL XL) 10 MG 24 hr tablet  Chronic kidney disease, stage III (moderate) -stable.  Await updated labs  Hyperlipidemia - has been well controlled, with LDL <70. Plan: pravastatin (PRAVACHOL) 40 MG tablet  Hypertensive renal disease - stable.  Await updated labs.   Fara Chute, PA-C Physician Assistant-Certified [facility]

## 2013-06-09 ENCOUNTER — Encounter: Payer: Self-pay | Admitting: Physician Assistant

## 2013-07-30 ENCOUNTER — Other Ambulatory Visit: Payer: Self-pay | Admitting: Physician Assistant

## 2013-08-16 ENCOUNTER — Other Ambulatory Visit: Payer: Self-pay | Admitting: Radiology

## 2013-08-16 DIAGNOSIS — E1129 Type 2 diabetes mellitus with other diabetic kidney complication: Secondary | ICD-10-CM

## 2013-08-16 DIAGNOSIS — I10 Essential (primary) hypertension: Secondary | ICD-10-CM

## 2013-08-16 DIAGNOSIS — E785 Hyperlipidemia, unspecified: Secondary | ICD-10-CM

## 2013-08-16 MED ORDER — GLIPIZIDE ER 10 MG PO TB24: 10.0000 mg | ORAL_TABLET | Freq: Every day | ORAL | Status: DC

## 2013-08-16 MED ORDER — SAXAGLIPTIN HCL 2.5 MG PO TABS: 2.5000 mg | ORAL_TABLET | Freq: Every day | ORAL | Status: DC

## 2013-08-16 MED ORDER — AMLODIPINE BESYLATE 10 MG PO TABS: ORAL_TABLET | ORAL | Status: DC

## 2013-08-16 MED ORDER — OLMESARTAN MEDOXOMIL 40 MG PO TABS: ORAL_TABLET | ORAL | Status: DC

## 2013-08-16 MED ORDER — HYDROCHLOROTHIAZIDE 25 MG PO TABS: ORAL_TABLET | ORAL | Status: DC

## 2013-08-16 MED ORDER — PRAVASTATIN SODIUM 40 MG PO TABS: 40.0000 mg | ORAL_TABLET | Freq: Every day | ORAL | Status: DC

## 2013-09-11 ENCOUNTER — Encounter: Payer: Self-pay | Admitting: Physician Assistant

## 2013-09-11 ENCOUNTER — Ambulatory Visit (INDEPENDENT_AMBULATORY_CARE_PROVIDER_SITE_OTHER): Payer: Medicare Other | Admitting: Physician Assistant

## 2013-09-11 VITALS — BP 130/62 | HR 71 | Temp 98.0°F | Resp 16 | Ht 70.0 in | Wt 176.2 lb

## 2013-09-11 DIAGNOSIS — N183 Chronic kidney disease, stage 3 unspecified: Secondary | ICD-10-CM

## 2013-09-11 DIAGNOSIS — E119 Type 2 diabetes mellitus without complications: Secondary | ICD-10-CM

## 2013-09-11 DIAGNOSIS — N189 Chronic kidney disease, unspecified: Secondary | ICD-10-CM

## 2013-09-11 DIAGNOSIS — I129 Hypertensive chronic kidney disease with stage 1 through stage 4 chronic kidney disease, or unspecified chronic kidney disease: Secondary | ICD-10-CM

## 2013-09-11 DIAGNOSIS — D190 Benign neoplasm of mesothelial tissue of pleura: Secondary | ICD-10-CM

## 2013-09-11 DIAGNOSIS — E785 Hyperlipidemia, unspecified: Secondary | ICD-10-CM

## 2013-09-11 DIAGNOSIS — I10 Essential (primary) hypertension: Secondary | ICD-10-CM

## 2013-09-11 DIAGNOSIS — Z23 Encounter for immunization: Secondary | ICD-10-CM

## 2013-09-11 DIAGNOSIS — N529 Male erectile dysfunction, unspecified: Secondary | ICD-10-CM

## 2013-09-11 LAB — COMPLETE METABOLIC PANEL WITH GFR
ALK PHOS: 58 U/L (ref 39–117)
ALT: 25 U/L (ref 0–53)
AST: 21 U/L (ref 0–37)
Albumin: 4 g/dL (ref 3.5–5.2)
BILIRUBIN TOTAL: 0.6 mg/dL (ref 0.2–1.2)
BUN: 27 mg/dL — AB (ref 6–23)
CO2: 26 mEq/L (ref 19–32)
CREATININE: 2.05 mg/dL — AB (ref 0.50–1.35)
Calcium: 8.8 mg/dL (ref 8.4–10.5)
Chloride: 102 mEq/L (ref 96–112)
GFR, EST NON AFRICAN AMERICAN: 33 mL/min — AB
GFR, Est African American: 38 mL/min — ABNORMAL LOW
GLUCOSE: 131 mg/dL — AB (ref 70–99)
Potassium: 4.3 mEq/L (ref 3.5–5.3)
Sodium: 136 mEq/L (ref 135–145)
Total Protein: 7.2 g/dL (ref 6.0–8.3)

## 2013-09-11 LAB — POCT GLYCOSYLATED HEMOGLOBIN (HGB A1C): Hemoglobin A1C: 6.7

## 2013-09-11 LAB — LIPID PANEL
CHOL/HDL RATIO: 3.9 ratio
Cholesterol: 126 mg/dL (ref 0–200)
HDL: 32 mg/dL — ABNORMAL LOW (ref >39)
LDL Cholesterol: 71 mg/dL (ref 0–99)
TRIGLYCERIDES: 117 mg/dL (ref <150)
VLDL: 23 mg/dL (ref 0–40)

## 2013-09-11 LAB — GLUCOSE, POCT (MANUAL RESULT ENTRY): POC Glucose: 152 mg/dl — AB (ref 70–99)

## 2013-09-11 MED ORDER — ZOSTER VACCINE LIVE 19400 UNT/0.65ML ~~LOC~~ SOLR: 0.6500 mL | Freq: Once | SUBCUTANEOUS | Status: DC

## 2013-09-11 MED ORDER — GLUCOSE BLOOD VI STRP: ORAL_STRIP | Status: DC

## 2013-09-11 NOTE — Progress Notes (Signed)
   Subjective:    [name], male    DOB: August 24, 1950, 63 y.o.   MRN: [id number]   PCP: No primary provider on file.  Chief Complaint  Patient presents with  . Diabetes  . Hyperlipidemia  . Medication Refill    Medications, allergies, past medical history, surgical history, family history, social history and problem list reviewed and updated.  HPI  Presents for routine follow-up of his chronic medical problems.  He feels well, is tolerating medications without adverse effects and has no new complaints.  He has turned 56, the age at which he agreed to proceed with several health maintenance items.  Frequency of home glucose monitoring: QAM, <140  Does not see a dentist or eye specialist regularly, despite urging. Plans to schedule now that he is 41.  Checks feet daily.  Is current with influenza vaccine.  Is not current with pneumococcal vaccine.  Plans to get the pneumococcal vaccine today. Will get shingles vaccine now that he is 63 as well.  Review of Systems Denies chest pain, shortness of breath, HA, dizziness, vision change, nausea, vomiting, diarrhea, constipation, melena, hematochezia, dysuria, increased urinary urgency or frequency, increased hunger or thirst, unintentional weight change, unexplained myalgias or arthralgias, rash.     Objective:   Physical Exam  Blood pressure 130/62, pulse 71, temperature 98 F (36.7 C), temperature source Oral, resp. rate 16, height 5\' 10"  (1.778 m), weight 176 lb 3.2 oz (79.924 kg), SpO2 98.00%. Body mass index is 25.28 kg/(m^2). Well-developed, well nourished BM who is awake, alert and oriented, in NAD. HEENT: [place]/AT, sclera and conjunctiva are clear.   Neck: supple, non-tender, no lymphadenopathy, thyromegaly. Heart: RRR, no murmur Lungs: normal effort, CTA Extremities: no cyanosis, clubbing or edema. Skin: warm and dry without rash. See diabetic foot exam. Psychologic: good mood and appropriate affect, normal  speech and behavior.   Results for orders placed in visit on 09/11/13  GLUCOSE, POCT (MANUAL RESULT ENTRY)      Result Value Ref Range   POC Glucose 152 (*) 70 - 99 mg/dl  POCT GLYCOSYLATED HEMOGLOBIN (HGB A1C)      Result Value Ref Range   Hemoglobin A1C 6.7         Assessment & Plan:  1. Type II or unspecified type diabetes mellitus without mention of complication, not stated as uncontrolled Controlled.  Continue current me - POCT glucose (manual entry) - POCT glycosylated hemoglobin (Hb A1C) - HM Diabetes Foot Exam - glucose blood (ACCU-CHEK AVIVA PLUS) test strip; Use as instructed  Dispense: 100 each; Refill: 4 - HM DIABETES EYE EXAM  2. Chronic kidney disease, stage III (moderate) 3. Hypertensive renal disease Stable.  Await labs.  Follow-up with Dr. Moshe Cipro per her recommendations. Continue to maintain control of HTN. - COMPLETE METABOLIC PANEL WITH GFR  4. Hyperlipidemia Await labs. - Lipid panel  5. Erectile dysfunction Stable.  6. Need for pneumococcal vaccination - Pneumococcal polysaccharide vaccine 23-valent greater than or equal to 2yo subcutaneous/IM  7. Need for shingles vaccine - zoster vaccine live, PF, (ZOSTAVAX) 74081 UNT/0.65ML injection; Inject 19,400 Units into the skin once.  Dispense: 0.65 mL; Refill: 0  Return in about 3 months (around 12/12/2013).  Fara Chute, PA-C Physician Assistant-Certified [facility]

## 2013-09-11 NOTE — Patient Instructions (Addendum)
Keep up the GREAT work!  I will contact you with your lab results as soon as they are available.   If you have not heard from me in 2 weeks, please contact me.  The fastest way to get your results is to register for My Chart (see the instructions on the last page of this printout).  Please call to schedule an eye exam.  I see Dr. [place] at [facility].  Please schedule with her or her partner, Dr. Wynetta Emery.  Please also schedule a dental visit.

## 2013-09-12 ENCOUNTER — Other Ambulatory Visit: Payer: Self-pay | Admitting: Physician Assistant

## 2013-09-16 ENCOUNTER — Encounter: Payer: Self-pay | Admitting: Physician Assistant

## 2013-12-18 ENCOUNTER — Ambulatory Visit (INDEPENDENT_AMBULATORY_CARE_PROVIDER_SITE_OTHER): Payer: Medicare Other | Admitting: Physician Assistant

## 2013-12-18 ENCOUNTER — Encounter: Payer: Self-pay | Admitting: Physician Assistant

## 2013-12-18 VITALS — BP 141/64 | HR 71 | Temp 98.0°F | Resp 16 | Ht 70.0 in | Wt 169.8 lb

## 2013-12-18 DIAGNOSIS — E1129 Type 2 diabetes mellitus with other diabetic kidney complication: Secondary | ICD-10-CM

## 2013-12-18 DIAGNOSIS — N183 Chronic kidney disease, stage 3 unspecified: Secondary | ICD-10-CM

## 2013-12-18 DIAGNOSIS — E785 Hyperlipidemia, unspecified: Secondary | ICD-10-CM

## 2013-12-18 LAB — COMPREHENSIVE METABOLIC PANEL
ALT: 28 U/L (ref 0–53)
AST: 25 U/L (ref 0–37)
Albumin: 4.4 g/dL (ref 3.5–5.2)
Alkaline Phosphatase: 52 U/L (ref 39–117)
BILIRUBIN TOTAL: 0.6 mg/dL (ref 0.2–1.2)
BUN: 41 mg/dL — ABNORMAL HIGH (ref 6–23)
CO2: 24 meq/L (ref 19–32)
Calcium: 9.6 mg/dL (ref 8.4–10.5)
Chloride: 103 mEq/L (ref 96–112)
Creat: 2.42 mg/dL — ABNORMAL HIGH (ref 0.50–1.35)
Glucose, Bld: 133 mg/dL — ABNORMAL HIGH (ref 70–99)
POTASSIUM: 4.7 meq/L (ref 3.5–5.3)
SODIUM: 136 meq/L (ref 135–145)
Total Protein: 7.4 g/dL (ref 6.0–8.3)

## 2013-12-18 LAB — LIPID PANEL
Cholesterol: 113 mg/dL (ref 0–200)
HDL: 31 mg/dL — ABNORMAL LOW (ref >39)
LDL Cholesterol: 60 mg/dL (ref 0–99)
Total CHOL/HDL Ratio: 3.6 Ratio
Triglycerides: 108 mg/dL (ref <150)
VLDL: 22 mg/dL (ref 0–40)

## 2013-12-18 LAB — GLUCOSE, POCT (MANUAL RESULT ENTRY): POC Glucose: 143 mg/dl — AB (ref 70–99)

## 2013-12-18 LAB — POCT GLYCOSYLATED HEMOGLOBIN (HGB A1C): HEMOGLOBIN A1C: 7

## 2013-12-18 NOTE — Progress Notes (Signed)
Subjective:    [name], male    DOB: [birth year], 63 y.o.   MRN: [id number]   PCP: JEFFERY,CHELLE, PA-C  Chief Complaint  Patient presents with  . Follow-up   Medications, allergies, past medical history, surgical history, family history, social history and problem list reviewed and updated.  Patient Active Problem List   Diagnosis Date Noted  . Hyperlipidemia 05/11/2013  . Hypertensive renal disease   . Type II or unspecified type diabetes mellitus with renal manifestations, not stated as uncontrolled(250.40)   . Chronic kidney disease, stage III (moderate)   . History of tobacco abuse   . Erectile dysfunction     Prior to Admission medications   Medication Sig Start Date End Date Taking? Authorizing Provider  amLODipine (NORVASC) 10 MG tablet TAKE ONE TABLET BY MOUTH ONCE DAILY 09/12/13  Yes Chelle S Jeffery, PA-C  aspirin 81 MG tablet Take 81 mg by mouth daily.   Yes Historical Provider, MD  BENICAR 40 MG tablet TAKE ONE TABLET BY MOUTH ONCE DAILY 09/12/13  Yes Chelle S Jeffery, PA-C  fish oil-omega-3 fatty acids 1000 MG capsule Take 1 g by mouth daily.   Yes Historical Provider, MD  glipiZIDE (GLUCOTROL XL) 10 MG 24 hr tablet TAKE ONE TABLET BY MOUTH ONCE DAILY 09/12/13  Yes Chelle S Jeffery, PA-C  glucose blood (ACCU-CHEK AVIVA PLUS) test strip Use as instructed 09/11/13  Yes Chelle S Jeffery, PA-C  hydrochlorothiazide (HYDRODIURIL) 25 MG tablet TAKE ONE TABLET BY MOUTH ONCE DAILY 09/12/13  Yes Chelle S Jeffery, PA-C  ONGLYZA 2.5 MG TABS tablet TAKE ONE TABLET BY MOUTH ONCE DAILY 09/12/13  Yes Chelle S Jeffery, PA-C  pravastatin (PRAVACHOL) 40 MG tablet TAKE ONE TABLET BY MOUTH ONCE DAILY 09/12/13  Yes Chelle S Jeffery, PA-C  zoster vaccine live, PF, (ZOSTAVAX) 72536 UNT/0.65ML injection Inject 19,400 Units into the skin once. 09/11/13   Chelle Janalee Dane, PA-C    HPI  Frequency of home glucose monitoring: QAM, <140. Only one hypoglycemic episode, brief. Does  not see a dentist or eye specialist regularly, despite urging. Plans to schedule now that he is 48.  Checks feet daily.  Is current with influenza vaccine.  Is partially current with pneumococcal vaccine, needs the Prevnar 13.  Plans to get the  shingles vaccine in the next month.   Review of Systems As above.    Objective:   Physical Exam  Vitals reviewed. Constitutional: He is oriented to person, place, and time. Vital signs are normal. He appears well-developed and well-nourished. He is active and cooperative. No distress.  BP 141/64  Pulse 71  Temp(Src) 98 F (36.7 C) (Oral)  Resp 16  Ht 5\' 10"  (1.778 m)  Wt 169 lb 12.8 oz (77.021 kg)  BMI 24.36 kg/m2  SpO2 99%  HENT:  Head: Normocephalic and atraumatic.  Right Ear: Hearing normal.  Left Ear: Hearing normal.  Eyes: Conjunctivae are normal. No scleral icterus.  Neck: Normal range of motion. Neck supple. No thyromegaly present.  Cardiovascular: Normal rate, regular rhythm and normal heart sounds.   Pulses:      Radial pulses are 2+ on the right side, and 2+ on the left side.  Pulmonary/Chest: Effort normal and breath sounds normal.  Lymphadenopathy:       Head (right side): No tonsillar, no preauricular, no posterior auricular and no occipital adenopathy present.       Head (left side): No tonsillar, no preauricular, no posterior auricular and no occipital  adenopathy present.    He has no cervical adenopathy.       Right: No supraclavicular adenopathy present.       Left: No supraclavicular adenopathy present.  Neurological: He is alert and oriented to person, place, and time. No sensory deficit.  Skin: Skin is warm, dry and intact. No rash noted. No cyanosis or erythema. Nails show no clubbing.  Psychiatric: He has a normal mood and affect.   See diabetic foot exam. Normal sensation. Nails are thick and long.  Results for orders placed in visit on 12/18/13  GLUCOSE, POCT (MANUAL RESULT ENTRY)      Result Value Ref  Range   POC Glucose 143 (*) 70 - 99 mg/dl  POCT GLYCOSYLATED HEMOGLOBIN (HGB A1C)      Result Value Ref Range   Hemoglobin A1C 7.0           Assessment & Plan:  1. Chronic kidney disease, stage III (moderate) - Comprehensive metabolic panel  2. Type II or unspecified type diabetes mellitus with renal manifestations, not stated as uncontrolled(250.40) - POCT glucose (manual entry) - POCT glycosylated hemoglobin (Hb A1C)  3. Hyperlipidemia - Lipid panel  Reminded again that he needs to get the shingles vaccine.  He'll get Prevnar 13 at his next visit.  He was also urged to schedule with a dentist and eye specialist, and he says he will.   Fara Chute, PA-C Physician Assistant-Certified [facility]

## 2013-12-18 NOTE — Patient Instructions (Signed)
I will contact you with your lab results as soon as they are available.   If you have not heard from me in 2 weeks, please contact me.  The fastest way to get your results is to register for My Chart (see the instructions on the last page of this printout).  Enjoy fishing! Be safe.

## 2013-12-20 ENCOUNTER — Encounter: Payer: Self-pay | Admitting: Physician Assistant

## 2014-02-12 ENCOUNTER — Encounter: Payer: Self-pay | Admitting: Physician Assistant

## 2014-03-18 ENCOUNTER — Ambulatory Visit (INDEPENDENT_AMBULATORY_CARE_PROVIDER_SITE_OTHER): Payer: Medicare Other | Admitting: Physician Assistant

## 2014-03-18 VITALS — BP 130/64 | HR 88 | Temp 97.8°F | Resp 16 | Ht 70.0 in | Wt 168.0 lb

## 2014-03-18 DIAGNOSIS — E1129 Type 2 diabetes mellitus with other diabetic kidney complication: Secondary | ICD-10-CM

## 2014-03-18 DIAGNOSIS — N183 Chronic kidney disease, stage 3 unspecified: Secondary | ICD-10-CM

## 2014-03-18 DIAGNOSIS — E785 Hyperlipidemia, unspecified: Secondary | ICD-10-CM

## 2014-03-18 DIAGNOSIS — Z23 Encounter for immunization: Secondary | ICD-10-CM

## 2014-03-18 LAB — POCT GLYCOSYLATED HEMOGLOBIN (HGB A1C): Hemoglobin A1C: 5.7

## 2014-03-18 LAB — COMPREHENSIVE METABOLIC PANEL
ALK PHOS: 63 U/L (ref 39–117)
ALT: 32 U/L (ref 0–53)
AST: 25 U/L (ref 0–37)
Albumin: 4.8 g/dL (ref 3.5–5.2)
BILIRUBIN TOTAL: 0.4 mg/dL (ref 0.2–1.2)
BUN: 32 mg/dL — ABNORMAL HIGH (ref 6–23)
CO2: 25 mEq/L (ref 19–32)
Calcium: 9.8 mg/dL (ref 8.4–10.5)
Chloride: 102 mEq/L (ref 96–112)
Creat: 2.07 mg/dL — ABNORMAL HIGH (ref 0.50–1.35)
Glucose, Bld: 70 mg/dL (ref 70–99)
Potassium: 4.4 mEq/L (ref 3.5–5.3)
Sodium: 138 mEq/L (ref 135–145)
TOTAL PROTEIN: 8 g/dL (ref 6.0–8.3)

## 2014-03-18 LAB — LIPID PANEL
CHOL/HDL RATIO: 4.1 ratio
Cholesterol: 130 mg/dL (ref 0–200)
HDL: 32 mg/dL — ABNORMAL LOW (ref >39)
LDL CALC: 73 mg/dL (ref 0–99)
Triglycerides: 127 mg/dL (ref <150)
VLDL: 25 mg/dL (ref 0–40)

## 2014-03-18 LAB — GLUCOSE, POCT (MANUAL RESULT ENTRY): POC Glucose: 82 mg/dl (ref 70–99)

## 2014-03-18 MED ORDER — PRAVASTATIN SODIUM 40 MG PO TABS: ORAL_TABLET | ORAL | Status: DC

## 2014-03-18 MED ORDER — GLIPIZIDE ER 10 MG PO TB24: ORAL_TABLET | ORAL | Status: DC

## 2014-03-18 MED ORDER — LOSARTAN POTASSIUM 100 MG PO TABS: 100.0000 mg | ORAL_TABLET | Freq: Every day | ORAL | Status: DC

## 2014-03-18 MED ORDER — HYDROCHLOROTHIAZIDE 25 MG PO TABS: ORAL_TABLET | ORAL | Status: DC

## 2014-03-18 MED ORDER — SITAGLIPTIN PHOSPHATE 50 MG PO TABS: 50.0000 mg | ORAL_TABLET | Freq: Every day | ORAL | Status: DC

## 2014-03-18 MED ORDER — AMLODIPINE BESYLATE 10 MG PO TABS: ORAL_TABLET | ORAL | Status: DC

## 2014-03-18 NOTE — Progress Notes (Signed)
Subjective:    [name], male    DOB: 06-Mar-1951, 63 y.o.   MRN: [id number]   PCP: Ikeya Brockel, PA-C  Chief Complaint  Patient presents with  . Medication Refill   Medications, allergies, past medical history, surgical history, family history, social history and problem list reviewed and updated.  Patient Active Problem List   Diagnosis Date Noted  . Hyperlipidemia 05/11/2013  . Hypertensive renal disease   . Type II or unspecified type diabetes mellitus with renal manifestations, not stated as uncontrolled(250.40)   . Chronic kidney disease, stage III (moderate)   . History of tobacco abuse   . Erectile dysfunction     HPI  This 63 y.o. male presents for evaluation of diabetes, HTN.    2 weeks ago, his wife was diagnosed with stage III breast cancer. They've had multiple provider visits and attended several classes on what to expect with chemo, followed by mastectomy and then radiation.  Her port-a-cath was placed this morning.  Frequency of home glucose monitoring: QAM, <130 until his wife recent diagnosis of stage III breast cancer. Since then only checked a couple of times. Only one hypoglycemic episode, brief, yesterday. Not eating well and intermittently not taking his medications.    Does not see a dentist or eye specialist regularly, despite urging. Plans to schedule now that he is 51.  Checks feet daily.  Is not current with influenza vaccine.  Is partially current with pneumococcal vaccine, needs the Prevnar 13.  Plans to get the shingles vaccine in the next month (he's been telling me this for several visits).  He notes that the Onglyza is now too expensive.  We switched him to it when his pharmacy benefit plan changed their preferred product. He tolerated it well at 50 mg.  He also notes that the Benicar is too expensive and requests a lower cost alternative.   Review of Systems Denies chest pain, shortness of breath, HA, dizziness, vision  change, nausea, vomiting, diarrhea, constipation, melena, hematochezia, dysuria, increased urinary urgency or frequency, increased hunger or thirst, unintentional weight change, unexplained myalgias or arthralgias, rash.     Objective:   Physical Exam  Vitals reviewed. Constitutional: He is oriented to person, place, and time. Vital signs are normal. He appears well-developed and well-nourished. He is active and cooperative. No distress.  BP 130/64  Pulse 88  Temp(Src) 97.8 F (36.6 C) (Oral)  Resp 16  Ht 5\' 10"  (1.778 m)  Wt 168 lb (76.204 kg)  BMI 24.11 kg/m2  SpO2 99%  HENT:  Head: Normocephalic and atraumatic.  Right Ear: Hearing normal.  Left Ear: Hearing normal.  Eyes: Conjunctivae are normal. No scleral icterus.  Neck: Normal range of motion. Neck supple. No thyromegaly present.  Cardiovascular: Normal rate, regular rhythm and normal heart sounds.   Pulses:      Radial pulses are 2+ on the right side, and 2+ on the left side.  Pulmonary/Chest: Effort normal and breath sounds normal.  Lymphadenopathy:       Head (right side): No tonsillar, no preauricular, no posterior auricular and no occipital adenopathy present.       Head (left side): No tonsillar, no preauricular, no posterior auricular and no occipital adenopathy present.    He has no cervical adenopathy.       Right: No supraclavicular adenopathy present.       Left: No supraclavicular adenopathy present.  Neurological: He is alert and oriented to person, place, and time. No  sensory deficit.  Skin: Skin is warm, dry and intact. No rash noted. No cyanosis or erythema. Nails show no clubbing.  Psychiatric: He has a normal mood and affect. His speech is normal and behavior is normal.   See DM foot exam.  Results for orders placed in visit on 03/18/14  GLUCOSE, POCT (MANUAL RESULT ENTRY)      Result Value Ref Range   POC Glucose 82  70 - 99 mg/dl  POCT GLYCOSYLATED HEMOGLOBIN (HGB A1C)      Result Value Ref Range     Hemoglobin A1C 5.7          Assessment & Plan:  1. Type II or unspecified type diabetes mellitus with renal manifestations, not stated as uncontrolled(250.40) Well controlled. Continue current treatment but stop Onglyza and switch back to Januvia.  If it's also too expensive, we could consider trying without or add a different agent (though they are likely at least as expensive) - POCT glucose (manual entry) - POCT glycosylated hemoglobin (Hb A1C) - Comprehensive metabolic panel - HM Diabetes Eye Exam - HM Diabetes Foot Exam - amLODipine (NORVASC) 10 MG tablet; TAKE ONE TABLET BY MOUTH ONCE DAILY  Dispense: 30 tablet; Refill: 5 - glipiZIDE (GLUCOTROL XL) 10 MG 24 hr tablet; TAKE ONE TABLET BY MOUTH ONCE DAILY  Dispense: 30 tablet; Refill: 5 - hydrochlorothiazide (HYDRODIURIL) 25 MG tablet; TAKE ONE TABLET BY MOUTH ONCE DAILY  Dispense: 30 tablet; Refill: 5 - losartan (COZAAR) 100 MG tablet; Take 1 tablet (100 mg total) by mouth daily.  Dispense: 30 tablet; Refill: 5 - sitaGLIPtin (JANUVIA) 50 MG tablet; Take 1 tablet (50 mg total) by mouth daily.  Dispense: 30 tablet; Refill: 5  2. Chronic kidney disease, stage III (moderate) Stable.  He sees Dr. Corliss Parish.  3. Hyperlipidemia Await lab results. - Lipid panel - pravastatin (PRAVACHOL) 40 MG tablet; TAKE ONE TABLET BY MOUTH ONCE DAILY  Dispense: 30 tablet; Refill: 5  He'll let me know if he needs anything to help him sleep. Flu vaccine and Prevnar 13 given today. Again urged to get eye and dental exams. RTC in 3 months.  Fara Chute, PA-C Physician Assistant-Certified [facility]

## 2014-03-18 NOTE — Patient Instructions (Addendum)
Please see your eye doctor and your dentist. Remember, that to take care of your wife, you have to be healthy yourself.  While you are helping her, don't neglect yourself. Give my best to your wife. You have lots of support at the [facility]. Let me know if you need something to help you sleep. Also, tell the pharmacy to call me if the Januvia is too expensive.

## 2014-03-20 ENCOUNTER — Telehealth: Payer: Self-pay | Admitting: Physician Assistant

## 2014-03-20 ENCOUNTER — Encounter: Payer: Self-pay | Admitting: Physician Assistant

## 2014-03-20 NOTE — Telephone Encounter (Signed)
Patient has questions about Onglyza RX. States that it was over $100.00 and he cannot afford that. Are there other alternatives for him?  [phone]

## 2014-03-20 NOTE — Telephone Encounter (Signed)
At his visit yesterday, we stopped the Onglyza and switched back to Januvia, due to cost.  If the Januvia is also too expensive, we can try Invokana (though I think it's expensive, too). Another option is to continue the glipizide as is, and watch to see what happens.  If he can maintain good control with just that along with healthy eating and regular exercise, we may not need to add anything else.

## 2014-03-22 NOTE — Telephone Encounter (Signed)
OK. Continue on glipizide as he is, without Januvia or Onglyza for now. Re-Check in 3 months, as planned, sooner if needed.

## 2014-03-22 NOTE — Telephone Encounter (Signed)
Pt wishes to remain on glipizide.

## 2014-03-23 NOTE — Telephone Encounter (Signed)
Pt.notified

## 2014-04-23 ENCOUNTER — Ambulatory Visit: Payer: Medicare Other | Admitting: Physician Assistant

## 2014-07-05 DIAGNOSIS — C801 Malignant (primary) neoplasm, unspecified: Secondary | ICD-10-CM

## 2014-07-05 HISTORY — DX: Malignant (primary) neoplasm, unspecified: C80.1

## 2014-07-17 LAB — BASIC METABOLIC PANEL
BUN: 26 mg/dL — AB (ref 4–21)
Creatinine: 2.1 mg/dL — AB (ref <=1.3)
Glucose: 140 mg/dL
Potassium: 4.5 mmol/L (ref 3.4–5.3)
SODIUM: 139 mmol/L (ref 137–147)

## 2014-07-17 LAB — HEMOGLOBIN A1C: Hgb A1c MFr Bld: 13.5 % — AB (ref 4.0–6.0)

## 2014-07-29 ENCOUNTER — Encounter: Payer: Self-pay | Admitting: Physician Assistant

## 2014-07-29 DIAGNOSIS — N183 Chronic kidney disease, stage 3 unspecified: Secondary | ICD-10-CM

## 2014-08-13 ENCOUNTER — Encounter: Payer: Self-pay | Admitting: *Deleted

## 2014-09-02 ENCOUNTER — Encounter: Payer: Self-pay | Admitting: *Deleted

## 2014-09-24 ENCOUNTER — Ambulatory Visit (INDEPENDENT_AMBULATORY_CARE_PROVIDER_SITE_OTHER): Payer: Medicare Other | Admitting: Physician Assistant

## 2014-09-24 VITALS — BP 138/66 | HR 81 | Temp 98.2°F | Resp 17 | Ht 71.25 in | Wt 169.0 lb

## 2014-09-24 DIAGNOSIS — N182 Chronic kidney disease, stage 2 (mild): Secondary | ICD-10-CM

## 2014-09-24 DIAGNOSIS — E785 Hyperlipidemia, unspecified: Secondary | ICD-10-CM | POA: Diagnosis not present

## 2014-09-24 DIAGNOSIS — N184 Chronic kidney disease, stage 4 (severe): Secondary | ICD-10-CM | POA: Diagnosis not present

## 2014-09-24 DIAGNOSIS — I129 Hypertensive chronic kidney disease with stage 1 through stage 4 chronic kidney disease, or unspecified chronic kidney disease: Secondary | ICD-10-CM

## 2014-09-24 DIAGNOSIS — N189 Chronic kidney disease, unspecified: Secondary | ICD-10-CM | POA: Diagnosis not present

## 2014-09-24 DIAGNOSIS — N183 Chronic kidney disease, stage 3 unspecified: Secondary | ICD-10-CM

## 2014-09-24 DIAGNOSIS — N181 Chronic kidney disease, stage 1: Secondary | ICD-10-CM | POA: Diagnosis not present

## 2014-09-24 DIAGNOSIS — E1122 Type 2 diabetes mellitus with diabetic chronic kidney disease: Secondary | ICD-10-CM

## 2014-09-24 DIAGNOSIS — N185 Chronic kidney disease, stage 5: Secondary | ICD-10-CM | POA: Diagnosis not present

## 2014-09-24 DIAGNOSIS — N529 Male erectile dysfunction, unspecified: Secondary | ICD-10-CM

## 2014-09-24 LAB — LIPID PANEL
CHOL/HDL RATIO: 5.3 ratio
CHOLESTEROL: 112 mg/dL (ref 0–200)
HDL: 21 mg/dL — ABNORMAL LOW (ref >=40)
LDL Cholesterol: 65 mg/dL (ref 0–99)
Triglycerides: 132 mg/dL (ref <150)
VLDL: 26 mg/dL (ref 0–40)

## 2014-09-24 LAB — COMPREHENSIVE METABOLIC PANEL
ALBUMIN: 4.5 g/dL (ref 3.5–5.2)
ALT: 30 U/L (ref 0–53)
AST: 25 U/L (ref 0–37)
Alkaline Phosphatase: 68 U/L (ref 39–117)
BUN: 33 mg/dL — ABNORMAL HIGH (ref 6–23)
CO2: 29 mEq/L (ref 19–32)
Calcium: 9.4 mg/dL (ref 8.4–10.5)
Chloride: 103 mEq/L (ref 96–112)
Creat: 2.07 mg/dL — ABNORMAL HIGH (ref 0.50–1.35)
Glucose, Bld: 74 mg/dL (ref 70–99)
POTASSIUM: 4.1 meq/L (ref 3.5–5.3)
SODIUM: 139 meq/L (ref 135–145)
TOTAL PROTEIN: 7.7 g/dL (ref 6.0–8.3)
Total Bilirubin: 0.7 mg/dL (ref 0.2–1.2)

## 2014-09-24 LAB — POCT GLYCOSYLATED HEMOGLOBIN (HGB A1C): Hemoglobin A1C: 6.5

## 2014-09-24 LAB — GLUCOSE, POCT (MANUAL RESULT ENTRY): POC Glucose: 84 mg/dl (ref 70–99)

## 2014-09-24 MED ORDER — SITAGLIPTIN PHOSPHATE 50 MG PO TABS: 50.0000 mg | ORAL_TABLET | Freq: Every day | ORAL | Status: DC

## 2014-09-24 MED ORDER — AMLODIPINE BESYLATE 10 MG PO TABS: ORAL_TABLET | ORAL | Status: DC

## 2014-09-24 MED ORDER — LOSARTAN POTASSIUM 100 MG PO TABS: 100.0000 mg | ORAL_TABLET | Freq: Every day | ORAL | Status: DC

## 2014-09-24 MED ORDER — HYDROCHLOROTHIAZIDE 25 MG PO TABS: ORAL_TABLET | ORAL | Status: DC

## 2014-09-24 MED ORDER — GLUCOSE BLOOD VI STRP: ORAL_STRIP | Status: DC

## 2014-09-24 MED ORDER — GLIPIZIDE ER 10 MG PO TB24: ORAL_TABLET | ORAL | Status: DC

## 2014-09-24 MED ORDER — PRAVASTATIN SODIUM 40 MG PO TABS
ORAL_TABLET | ORAL | Status: DC
Start: 2014-09-24 — End: 2015-04-03

## 2014-09-24 NOTE — Progress Notes (Signed)
[name], male    DOB: 1951/01/10, 63 y.o.   MRN: [id number]  PCP: Janey Petron, PA-C  Subjective:   Chief Complaint  Patient presents with  . Follow-up    HPI Presents for follow-up of Diabetes, CKD, HTN and hyperlipidemia. All these have been stable, but he's currently out of all meds except Januvia. Since his last visit, he's really stopped taking care of himself and put all his energy into caring for his wife, who was diagnosed with breast cancer.  She has completed chemotherapy, had a mastectomy and is scheduled to start radiation treatment next week.  He's starting to feel like things are going to be ok.  Review of Systems Review of Systems  Denies chest pain, shortness of breath, HA, dizziness, vision change, nausea, vomiting, diarrhea, constipation, melena, hematochezia, dysuria, increased urinary urgency or frequency, increased hunger or thirst, unintentional weight change, unexplained myalgias or arthralgias, rash.    Patient Active Problem List   Diagnosis Date Noted  . Hyperlipidemia 05/11/2013  . Hypertensive renal disease   . Type II or unspecified type diabetes mellitus with renal manifestations, not stated as uncontrolled(250.40)   . Chronic kidney disease, stage III (moderate)   . History of tobacco abuse   . Erectile dysfunction      Prior to Admission medications   Medication Sig Start Date End Date Taking? Authorizing Provider  sitaGLIPtin (JANUVIA) 50 MG tablet Take 1 tablet (50 mg total) by mouth daily. 03/18/14  Yes Barby Colvard S Stephinie Battisti, PA-C  amLODipine (NORVASC) 10 MG tablet TAKE ONE TABLET BY MOUTH ONCE DAILY Patient not taking: Reported on 09/24/2014 03/18/14   Ashritha Desrosiers S Rocsi Hazelbaker, PA-C  aspirin 81 MG tablet Take 81 mg by mouth daily.    Historical Provider, MD  fish oil-omega-3 fatty acids 1000 MG capsule Take 1 g by mouth daily.    Historical Provider, MD  glipiZIDE (GLUCOTROL XL) 10 MG 24 hr tablet TAKE ONE TABLET BY MOUTH ONCE  DAILY Patient not taking: Reported on 09/24/2014 03/18/14   Kyasia Steuck S Jaryd Drew, PA-C  glucose blood (ACCU-CHEK AVIVA PLUS) test strip Use as instructed Patient not taking: Reported on 09/24/2014 09/11/13   Yancey Pedley S Meghin Thivierge, PA-C  hydrochlorothiazide (HYDRODIURIL) 25 MG tablet TAKE ONE TABLET BY MOUTH ONCE DAILY Patient not taking: Reported on 09/24/2014 03/18/14   Skarlet Lyons S Monai Hindes, PA-C  losartan (COZAAR) 100 MG tablet Take 1 tablet (100 mg total) by mouth daily. Patient not taking: Reported on 09/24/2014 03/18/14   Mariea Mcmartin S Austen Wygant, PA-C  pravastatin (PRAVACHOL) 40 MG tablet TAKE ONE TABLET BY MOUTH ONCE DAILY Patient not taking: Reported on 09/24/2014 03/18/14   Fara Chute, PA-C     Allergies  Allergen Reactions  . Ace Inhibitors Hives       Objective:  Physical Exam  Physical Exam  Constitutional: He is oriented to person, place, and time. Vital signs are normal. He appears well-developed and well-nourished. He is active and cooperative. No distress.  BP 138/66 mmHg  Pulse 81  Temp(Src) 98.2 F (36.8 C) (Oral)  Resp 17  Ht 5' 11.25" (1.81 m)  Wt 169 lb (76.658 kg)  BMI 23.40 kg/m2  SpO2 98%  HENT:  Head: Normocephalic and atraumatic.  Right Ear: Hearing normal.  Left Ear: Hearing normal.  Eyes: Conjunctivae are normal. No scleral icterus.  Neck: Normal range of motion. Neck supple. No thyromegaly present.  Cardiovascular: Normal rate, regular rhythm and normal heart sounds.   Pulses:  Radial pulses are 2+ on the right side, and 2+ on the left side.  Pulmonary/Chest: Effort normal and breath sounds normal.  Lymphadenopathy:       Head (right side): No tonsillar, no preauricular, no posterior auricular and no occipital adenopathy present.       Head (left side): No tonsillar, no preauricular, no posterior auricular and no occipital adenopathy present.    He has no cervical adenopathy.       Right: No supraclavicular adenopathy present.       Left: No supraclavicular  adenopathy present.  Neurological: He is alert and oriented to person, place, and time. No sensory deficit.  Skin: Skin is warm, dry and intact. No rash noted. No cyanosis or erythema. Nails show no clubbing.  Psychiatric: He has a normal mood and affect. His speech is normal and behavior is normal.   Diabetic Foot Exam - Simple   Simple Foot Form  Diabetic Foot exam was performed with the following findings:  Yes 09/24/2014  4:05 PM  Visual Inspection  No deformities, no ulcerations, no other skin breakdown bilaterally:  Yes  Sensation Testing  Intact to touch and monofilament testing bilaterally:  Yes  Pulse Check  Posterior Tibialis and Dorsalis pulse intact bilaterally:  Yes  Comments     Results for orders placed or performed in visit on 09/24/14  POCT glucose (manual entry)  Result Value Ref Range   POC Glucose 84 70 - 99 mg/dl  POCT glycosylated hemoglobin (Hb A1C)  Result Value Ref Range   Hemoglobin A1C 6.5            Assessment & Plan:   1. Type 2 diabetes mellitus with diabetic chronic kidney disease Remarkably stable, given recent inattention to his own health. Continue Januvia. Restart Glipizide. - glucose blood (ACCU-CHEK AVIVA PLUS) test strip; Use as instructed  Dispense: 100 each; Refill: 4 - sitaGLIPtin (JANUVIA) 50 MG tablet; Take 1 tablet (50 mg total) by mouth daily.  Dispense: 30 tablet; Refill: 5- glipiZIDE (GLUCOTROL XL) 10 MG 24 hr tablet; TAKE ONE TABLET BY MOUTH ONCE DAILY  Dispense: 30 tablet; Refill: 5 - POCT glucose (manual entry) - POCT glycosylated hemoglobin (Hb A1C) - Microalbumin, urine  2. Hypertensive renal disease, stage 1-4 or unspecified chronic kidney disease Controlled. Resume regular medications. - losartan (COZAAR) 100 MG tablet; Take 1 tablet (100 mg total) by mouth daily.  Dispense: 30 tablet; Refill: 5 - hydrochlorothiazide (HYDRODIURIL) 25 MG tablet; TAKE ONE TABLET BY MOUTH ONCE DAILY  Dispense: 30 tablet; Refill: 5 -  amLODipine (NORVASC) 10 MG tablet; TAKE ONE TABLET BY MOUTH ONCE DAILY  Dispense: 30 tablet; Refill: 5 - Comprehensive metabolic panel  3. Chronic kidney disease, stage III (moderate) Mainatin control of DM and HTN.  4. Erectile dysfunction, unspecified erectile dysfunction type Stable.  5. Hyperlipidemia Await lab results. Adjust regimen if needed, though he has been off pravastatin for some time recently. - pravastatin (PRAVACHOL) 40 MG tablet; TAKE ONE TABLET BY MOUTH ONCE DAILY  Dispense: 30 tablet; Refill: 5 - Lipid panel   Fara Chute, PA-C Physician Assistant-Certified [facility]

## 2014-09-25 ENCOUNTER — Encounter: Payer: Self-pay | Admitting: Physician Assistant

## 2014-09-25 LAB — MICROALBUMIN, URINE: Microalb, Ur: 6.1 mg/dL — ABNORMAL HIGH (ref <2.0)

## 2014-10-02 NOTE — Progress Notes (Signed)
appt with chelle on June 3rd

## 2014-10-31 ENCOUNTER — Encounter (HOSPITAL_COMMUNITY): Payer: Self-pay

## 2014-10-31 ENCOUNTER — Ambulatory Visit (INDEPENDENT_AMBULATORY_CARE_PROVIDER_SITE_OTHER): Payer: Medicare Other | Admitting: Family Medicine

## 2014-10-31 ENCOUNTER — Ambulatory Visit (INDEPENDENT_AMBULATORY_CARE_PROVIDER_SITE_OTHER): Payer: Medicare Other

## 2014-10-31 ENCOUNTER — Encounter (HOSPITAL_COMMUNITY)
Admission: RE | Admit: 2014-10-31 | Discharge: 2014-10-31 | Disposition: A | Discharge status: Home / Self Care | Payer: Medicare Other | Source: Ambulatory Visit | Attending: Physician Assistant | Admitting: Physician Assistant

## 2014-10-31 ENCOUNTER — Other Ambulatory Visit: Payer: Self-pay | Admitting: Physician Assistant

## 2014-10-31 ENCOUNTER — Ambulatory Visit (HOSPITAL_COMMUNITY): Payer: Medicare Other

## 2014-10-31 ENCOUNTER — Ambulatory Visit (HOSPITAL_COMMUNITY)
Admission: RE | Admit: 2014-10-31 | Discharge: 2014-10-31 | Disposition: A | Discharge status: Home / Self Care | Payer: Medicare Other | Source: Ambulatory Visit | Attending: Physician Assistant | Admitting: Physician Assistant

## 2014-10-31 VITALS — BP 125/73 | HR 105 | Temp 97.5°F | Resp 17 | Ht 71.0 in | Wt 165.2 lb

## 2014-10-31 DIAGNOSIS — R0609 Other forms of dyspnea: Secondary | ICD-10-CM

## 2014-10-31 DIAGNOSIS — R591 Generalized enlarged lymph nodes: Secondary | ICD-10-CM

## 2014-10-31 DIAGNOSIS — I10 Essential (primary) hypertension: Secondary | ICD-10-CM

## 2014-10-31 DIAGNOSIS — R0789 Other chest pain: Secondary | ICD-10-CM

## 2014-10-31 DIAGNOSIS — R599 Enlarged lymph nodes, unspecified: Secondary | ICD-10-CM | POA: Diagnosis not present

## 2014-10-31 DIAGNOSIS — R161 Splenomegaly, not elsewhere classified: Secondary | ICD-10-CM | POA: Diagnosis not present

## 2014-10-31 DIAGNOSIS — C8590 Non-Hodgkin lymphoma, unspecified, unspecified site: Secondary | ICD-10-CM

## 2014-10-31 DIAGNOSIS — N189 Chronic kidney disease, unspecified: Secondary | ICD-10-CM

## 2014-10-31 DIAGNOSIS — E1122 Type 2 diabetes mellitus with diabetic chronic kidney disease: Secondary | ICD-10-CM

## 2014-10-31 LAB — POCT CBC
GRANULOCYTE PERCENT: 57.8 % (ref 37–80)
HEMATOCRIT: 30.7 % — AB (ref 43.5–53.7)
Hemoglobin: 9.8 g/dL — AB (ref 14.1–18.1)
LYMPH, POC: 4.3 — AB (ref 0.6–3.4)
MCH: 26.4 pg — AB (ref 27–31.2)
MCHC: 31.9 g/dL (ref 31.8–35.4)
MCV: 82.8 fL (ref 80–97)
MID (CBC): 0.7 (ref 0–0.9)
MPV: 6.2 fL (ref 0–99.8)
POC Granulocyte: 6.9 (ref 2–6.9)
POC LYMPH PERCENT: 36.1 %L (ref 10–50)
POC MID %: 6.1 %M (ref 0–12)
Platelet Count, POC: 201 10*3/uL (ref 142–424)
RBC: 3.71 M/uL — AB (ref 4.69–6.13)
RDW, POC: 16.3 %
WBC: 11.9 10*3/uL — AB (ref 4.6–10.2)

## 2014-10-31 LAB — BASIC METABOLIC PANEL
BUN: 43 mg/dL — ABNORMAL HIGH (ref 6–23)
CALCIUM: 8.8 mg/dL (ref 8.4–10.5)
CO2: 21 mEq/L (ref 19–32)
Chloride: 105 mEq/L (ref 96–112)
Creat: 2.3 mg/dL — ABNORMAL HIGH (ref 0.50–1.35)
Glucose, Bld: 77 mg/dL (ref 70–99)
Potassium: 4.5 mEq/L (ref 3.5–5.3)
SODIUM: 138 meq/L (ref 135–145)

## 2014-10-31 LAB — BRAIN NATRIURETIC PEPTIDE: Brain Natriuretic Peptide: 8.5 pg/mL (ref 0.0–100.0)

## 2014-10-31 LAB — D-DIMER, QUANTITATIVE: D-Dimer, Quant: 1.12 ug/mL-FEU — ABNORMAL HIGH (ref 0.00–0.48)

## 2014-10-31 LAB — SEDIMENTATION RATE: Sed Rate: 42 mm/hr — ABNORMAL HIGH (ref 0–20)

## 2014-10-31 LAB — TROPONIN I

## 2014-10-31 MED ORDER — IPRATROPIUM BROMIDE 0.02 % IN SOLN
0.5000 mg | Freq: Once | RESPIRATORY_TRACT | Status: AC
  Administered 2014-10-31: 0.5 mg via RESPIRATORY_TRACT

## 2014-10-31 MED ORDER — TECHNETIUM TC 99M DIETHYLENETRIAME-PENTAACETIC ACID: 40.2000 | Freq: Once | INTRAVENOUS | Status: AC | PRN

## 2014-10-31 MED ORDER — TECHNETIUM TO 99M ALBUMIN AGGREGATED
5.2000 | Freq: Once | INTRAVENOUS | Status: AC | PRN
  Administered 2014-10-31: 5 via INTRAVENOUS

## 2014-10-31 MED ORDER — ALBUTEROL SULFATE (2.5 MG/3ML) 0.083% IN NEBU
2.5000 mg | INHALATION_SOLUTION | Freq: Once | RESPIRATORY_TRACT | Status: AC
  Administered 2014-10-31: 2.5 mg via RESPIRATORY_TRACT

## 2014-10-31 NOTE — Progress Notes (Signed)
Subjective:    [name], male    DOB: 21-Nov-1950, 64 y.o.   MRN: [id number]  Chief Complaint  Patient presents with  . neck swelling  . Shortness of Breath  . Cough    productive  . Adenopathy    under both arms   Patient Active Problem List   Diagnosis Date Noted  . Hyperlipidemia 05/11/2013  . Hypertensive renal disease   . DM (diabetes mellitus), type 2 with renal complications   . Chronic kidney disease, stage III (moderate)   . History of tobacco abuse   . Erectile dysfunction    Prior to Admission medications   Medication Sig Start Date End Date Taking? Authorizing Provider  amLODipine (NORVASC) 10 MG tablet TAKE ONE TABLET BY MOUTH ONCE DAILY 09/24/14  Yes Chelle S Jeffery, PA-C  aspirin 81 MG tablet Take 81 mg by mouth daily.   Yes Historical Provider, MD  fish oil-omega-3 fatty acids 1000 MG capsule Take 1 g by mouth daily.   Yes Historical Provider, MD  glipiZIDE (GLUCOTROL XL) 10 MG 24 hr tablet TAKE ONE TABLET BY MOUTH ONCE DAILY 09/24/14  Yes Chelle S Jeffery, PA-C  glucose blood (ACCU-CHEK AVIVA PLUS) test strip Use as instructed 09/24/14  Yes Chelle S Jeffery, PA-C  hydrochlorothiazide (HYDRODIURIL) 25 MG tablet TAKE ONE TABLET BY MOUTH ONCE DAILY 09/24/14  Yes Chelle S Jeffery, PA-C  losartan (COZAAR) 100 MG tablet Take 1 tablet (100 mg total) by mouth daily. 09/24/14  Yes Chelle S Jeffery, PA-C  pravastatin (PRAVACHOL) 40 MG tablet TAKE ONE TABLET BY MOUTH ONCE DAILY 09/24/14  Yes Chelle S Jeffery, PA-C  sitaGLIPtin (JANUVIA) 50 MG tablet Take 1 tablet (50 mg total) by mouth daily. 09/24/14  Yes Chelle Janalee Dane, PA-C   Medications, allergies, past medical history, surgical history, family history, social history and problem list reviewed and updated.  HPI  64 yom with pmh dm2 with renal comps, htn, high cho presents with sob, swollen lympn nodes.  Last visit --> 09/24/14. DM fairly well controlled, A1C 6.5. HTN well controlled. SCr 2.07, BUN 33  which is relatively stable for pt. Continue januvia, glipizide, cozaar, hctz, norvasc as prescribed. No cp, sob at that time.   Sx started 3 wks with uri sx, head congestion, rhinorrhea, mild non prod cough. Sx persisted for 1-2 wks. Noticed swollen LNs bilateral neck for past couple weeks. Denies fevers, chills, trouble swallowing, breathing. Has had night sweats past couple wks. Down 4# past month. Pt feels like not much of an appetite. Cough has resolved.   Approx 2 wks ago started having increasing DOE. Would notice with mowing lawn, walking in backyard more sob than normal. Having assoc chest tightness across chest with the sob. This lasts during exertion and relieves after resting for several minutes. No radiation of tightness. Assoc heart racing. Happening each time he's active past 1-2 weeks. No sob or cp at rest.   Denies orthopnea, pnd, cough. Denies leg swelling. Denies leg pain. No hx blood clots. No recent immobilizations.   Review of Systems See HPI. No abd pain, n/v, diarrhea. No diaphoresis.     Objective:   Physical Exam  Constitutional: He is oriented to person, place, and time. He appears well-developed and well-nourished.  Non-toxic appearance. He does not have a sickly appearance. He does not appear ill. No distress.  BP 138/50 mmHg  Pulse 105  Temp(Src) 97.5 F (36.4 C) (Oral)  Resp 17  Ht '5\' 11"'$  (  1.803 m)  Wt 165 lb 3.2 oz (74.934 kg)  BMI 23.05 kg/m2  SpO2 92%   HENT:  Right Ear: Tympanic membrane normal.  Left Ear: Tympanic membrane normal.  Mouth/Throat: Uvula is midline, oropharynx is clear and moist and mucous membranes are normal.  Neck: No JVD present. Carotid bruit is not present.  Cardiovascular: Normal rate, regular rhythm and normal heart sounds.  PMI is not displaced.  Exam reveals no gallop.   No murmur heard. Pulses:      Dorsalis pedis pulses are 2+ on the right side, and 2+ on the left side.  Pulmonary/Chest: Effort normal. No tachypnea. He has  no decreased breath sounds. He has no wheezes. He has no rhonchi. He has no rales.  Lymphadenopathy:       Head (right side): Submental, submandibular and tonsillar adenopathy present.       Head (left side): Submental, submandibular and tonsillar adenopathy present.    He has cervical adenopathy.       Right cervical: Superficial cervical adenopathy present.       Left cervical: Superficial cervical and posterior cervical adenopathy present.  Diffuse head and cervical lymphadenopathy. LNs firm/hard. Most fairly mobile. Non tender.   Neurological: He is alert and oriented to person, place, and time.  Psychiatric: He has a normal mood and affect. His speech is normal and behavior is normal.   Duoneb performed secondary to cxr findings and 02 sat. 02 sat improved from 92 --> 98% post duoneb.   [facility] reading (PRIMARY) by  Dr. Brigitte Pulse. CXR Findings: Possible infiltrate RLL. Blunting of right costophrenic angle   EKG read by Dr Brigitte Pulse. Findings: No acute findings.   Results for orders placed or performed in visit on 10/31/14  POCT CBC  Result Value Ref Range   WBC 11.9 (A) 4.6 - 10.2 K/uL   Lymph, poc 4.3 (A) 0.6 - 3.4   POC LYMPH PERCENT 36.1 10 - 50 %L   MID (cbc) 0.7 0 - 0.9   POC MID % 6.1 0 - 12 %M   POC Granulocyte 6.9 2 - 6.9   Granulocyte percent 57.8 37 - 80 %G   RBC 3.71 (A) 4.69 - 6.13 M/uL   Hemoglobin 9.8 (A) 14.1 - 18.1 g/dL   HCT, POC 30.7 (A) 43.5 - 53.7 %   MCV 82.8 80 - 97 fL   MCH, POC 26.4 (A) 27 - 31.2 pg   MCHC 31.9 31.8 - 35.4 g/dL   RDW, POC 16.3 %   Platelet Count, POC 201 142 - 424 K/uL   MPV 6.2 0 - 99.8 fL      Assessment & Plan:   64 yom with pmh dm2 with renal comps, htn, high cho presents with sob, swollen lympn nodes.  DOE (dyspnea on exertion) - Plan: EKG 12-Lead, DG Chest 2 View, Basic metabolic panel Chest tightness - Plan: EKG 12-Lead, DG Chest 2 View --cxr today with atelectasis vs infiltrate, referral for non contrast chest ct in light of sx,  abn cxr, and lymphadenopathy below --02 sats improved with duoneb --concern for angina with doe and chest tightness with exertion past 2 weeks --> urgent cardiology referral placed --stat troponin, d dimer, bmp bnp today --> will follow on results and respond accordingly with possible change of ct to cta pending d dimer --no unilateral leg pain, swelling, recent immobilization  Lymphadenopathy of head and neck - Plan: POCT CBC --concerning for malignancy, inflammatory conditions --cbc with mild leukocytosis and lymphocytosis --new  onset anemia on cbc --night sweats, small amnt unintentional wt loss past few wks --scheduling non constrast chest ct for tomo am  Type 2 diabetes mellitus with diabetic chronic kidney disease --last a1c well controlled last month, continue with januvia, glipizide --bmp elevated but stable last month, recheck today in light of possible cta referral  HTN, goal below 130/80 --relatively well controlled today --continue cozaar, hctz, amlodipine --bmp pending  Julieta Gutting, PA-C Physician Assistant-Certified [facility]  10/31/2014 10:08 AM

## 2014-10-31 NOTE — Patient Instructions (Signed)
We are concerned about several things today including your shortness of breath, chest tightness, swollen lymph nodes, and possible findings on the chest xray.  We did a breathing treatment today and your oxygen level improved. We have referred you urgently to see [facility]. We'll let you know when that is scheduled. You have a lot of risk factors for heart disease and we need to make sure your heart is not causing this.  We have drawn several labs today and most will be back in the next several hours. We'll let you know the results and probably get you to have a chest scan depending on what they show.  If you start having worse shortness of breath or chest tightness please go to the ER asap!

## 2014-11-01 ENCOUNTER — Telehealth: Payer: Self-pay | Admitting: Hematology and Oncology

## 2014-11-01 ENCOUNTER — Ambulatory Visit (HOSPITAL_COMMUNITY): Payer: Medicare Other

## 2014-11-01 NOTE — Telephone Encounter (Signed)
new patient appt-s/w patient and gve np appt for 05/02 @ 1:45 w/Dr. Alvy Bimler

## 2014-11-03 NOTE — Progress Notes (Signed)
[name] [name] with severe cervical adenopathy - matted over entire anterior and posterior cervical region.   Stat d-dimer obtained - was sig elevated so need imaging to r/o PE.  Unable to do CTA since [name] [name] has CKD w/ GFR of approx 38 so will order non-contrast chest CT along with VQ scan.  VQ scan w/ low probability of PE. EKG abnml w/ signs of strain so referred to cardiology - Dr. 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 Chest CT shows diffuse severe lymphadenopathy and enlarged spleen consistent w/ lymphoma. 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  Both [name] [name] and wife understood and had no questions - took information very well - neither was upset.  [name] [name]'s wife is currently under treatment for breast cancer and so goes in almost daily for therapy so [name] [name] is hoping that they will be able to coordinate care.    [name] [name] assessed, reviewed documentation and agree w/ assessment and plan. Delman Cheadle, MD MPH   Results for orders placed or performed in visit on 10/31/14  Sedimentation Rate  Result Value Ref Range   Sed Rate 42 (H) 0 - 20 mm/hr  Troponin I  Result Value Ref Range   Troponin I <0.01 <0.06 ng/mL  D-dimer, quantitative  Result Value Ref Range   D-Dimer, Quant 1.12 (H) 0.00 - 0.48 ug/mL-FEU  Brain natriuretic peptide  Result Value Ref Range   Brain Natriuretic Peptide 8.5 0.0 - 100.0 pg/mL  Basic metabolic panel  Result Value Ref Range   Sodium 138 135 - 145 mEq/L   Potassium 4.5 3.5 - 5.3 mEq/L   Chloride 105 96 - 112 mEq/L   CO2 21 19 - 32 mEq/L   Glucose, Bld 77 70 - 99 mg/dL   BUN 43 (H) 6 - 23 mg/dL    Creat 2.30 (H) 0.50 - 1.35 mg/dL   Calcium 8.8 8.4 - 10.5 mg/dL  POCT CBC  Result Value Ref Range   WBC 11.9 (A) 4.6 - 10.2 K/uL   Lymph, poc 4.3 (A) 0.6 - 3.4   POC LYMPH PERCENT 36.1 10 - 50 %L   MID (cbc) 0.7 0 - 0.9   POC MID % 6.1 0 - 12 %M   POC Granulocyte 6.9 2 - 6.9   Granulocyte percent 57.8 37 - 80 %G   RBC 3.71 (A) 4.69 - 6.13 M/uL   Hemoglobin 9.8 (A) 14.1 - 18.1 g/dL   HCT, POC 30.7 (A) 43.5 - 53.7 %   MCV 82.8 80 - 97 fL   MCH, POC 26.4 (A) 27 - 31.2 pg   MCHC 31.9 31.8 - 35.4 g/dL   RDW, POC 16.3 %   Platelet Count, POC 201 142 - 424 K/uL   MPV 6.2 0 - 99.8 fL

## 2014-11-04 ENCOUNTER — Telehealth: Payer: Self-pay | Admitting: Physician Assistant

## 2014-11-04 ENCOUNTER — Encounter: Payer: Self-pay | Admitting: Hematology and Oncology

## 2014-11-04 ENCOUNTER — Telehealth: Payer: Self-pay | Admitting: Hematology and Oncology

## 2014-11-04 ENCOUNTER — Ambulatory Visit (HOSPITAL_BASED_OUTPATIENT_CLINIC_OR_DEPARTMENT_OTHER): Payer: Medicare Other | Admitting: Hematology and Oncology

## 2014-11-04 ENCOUNTER — Ambulatory Visit: Payer: Medicare Other

## 2014-11-04 VITALS — BP 150/50 | HR 109 | Temp 98.2°F | Resp 20 | Ht 70.0 in | Wt 165.7 lb

## 2014-11-04 DIAGNOSIS — D630 Anemia in neoplastic disease: Secondary | ICD-10-CM

## 2014-11-04 DIAGNOSIS — N183 Chronic kidney disease, stage 3 unspecified: Secondary | ICD-10-CM

## 2014-11-04 DIAGNOSIS — C8338 Diffuse large B-cell lymphoma, lymph nodes of multiple sites: Secondary | ICD-10-CM | POA: Insufficient documentation

## 2014-11-04 DIAGNOSIS — C8590 Non-Hodgkin lymphoma, unspecified, unspecified site: Secondary | ICD-10-CM | POA: Diagnosis not present

## 2014-11-04 NOTE — Telephone Encounter (Signed)
Pt confirmed labs/ov per 05/02 POF, gave pt AVS and Calendar..... KJ, called [facility] set pt up with Dr. Fanny Skates on 05/03 at 2pm pt is aware, they will put up images from [place] .... Kj

## 2014-11-04 NOTE — Assessment & Plan Note (Signed)
He has chronic kidney disease and this is unrelated to the lymphoma. I recommend PET CT scan for staging rather than CT scan due to chronic kidney disease.

## 2014-11-04 NOTE — Assessment & Plan Note (Signed)
His overall presentation with diffuse lymphadenopathy, regular night sweats and significant anemia highly suspicious for lymphoma. I will proceed to order a staging PET CT scan and excisional lymph node biopsy by general surgery. I plan to see him in about 2 weeks for further assessment and hopefully would have more information by then.

## 2014-11-04 NOTE — Progress Notes (Signed)
[place] NOTE  Patient Care Team: Fara Chute, PA-C as PCP - General (Physician Assistant) Corliss Parish, MD as Consulting Physician (Nephrology)  CHIEF COMPLAINTS/PURPOSE OF CONSULTATION:  Diffuse lymphadenopathy, night sweats and anemia, suspect lymphoma  HISTORY OF PRESENTING ILLNESS:  [name] [name] 64 y.o. male is here because of possible diagnosis of lymphoma. He is present today with his wife. He is a retired [employer] consultant. He started to complain of new onset of diffuse lymphadenopathy, starting around the right jaw, with new lymphadenopathy also noted in the contralateral side of neck, axilla and inguinal regions. He also complained of regular night sweats, occasionally drenching in nature. He also complained of some mild fatigue. He denies loss of appetite, skin itching or weight loss. He had multiple imaging studies done recently that came back highly suspicious for lymphoma and hence he is referred here.  MEDICAL HISTORY:  Past Medical History  Diagnosis Date  . Essential hypertension, benign   . Type II or unspecified type diabetes mellitus with renal manifestations, not stated as uncontrolled   . Chronic kidney disease, stage III (moderate)   . History of tobacco abuse     quit 12/2008  . History of ETOH abuse     quit 1999  . Erectile dysfunction     SURGICAL HISTORY: Past Surgical History  Procedure Laterality Date  . Arm wound repair / closure      chainsaw; left forearm/wrist    SOCIAL HISTORY: History   Social History  . Marital Status: Married    Spouse Name: [name]  . Number of Children: 2  . Years of Education: 11   Occupational History  . retired     Location manager   Social History Main Topics  . Smoking status: Former Smoker -- 1.00 packs/day for 45 years    Types: Cigarettes    Quit date: 01/01/2009  . Smokeless tobacco: Never Used  . Alcohol Use: No  . Drug Use: No  . Sexual Activity:     Partners: Female   Other Topics Concern  . Not on file   Social History Narrative   Lives with wife. She was diagnosed with stage III breast cancer 03/2014. She completed chemotherapy and had a mastectomy, and starts radiation therapy 09/2014.    FAMILY HISTORY: Family History  Problem Relation Age of Onset  . Cancer Father     colon  . Cancer Sister     Breast  . Stroke Brother   . Stroke Brother     ALLERGIES:  is allergic to ace inhibitors.  MEDICATIONS:  Current Outpatient Prescriptions  Medication Sig Dispense Refill  . amLODipine (NORVASC) 10 MG tablet TAKE ONE TABLET BY MOUTH ONCE DAILY 30 tablet 5  . aspirin 81 MG tablet Take 81 mg by mouth daily.    . fish oil-omega-3 fatty acids 1000 MG capsule Take 1 g by mouth daily.    Marland Kitchen glipiZIDE (GLUCOTROL XL) 10 MG 24 hr tablet TAKE ONE TABLET BY MOUTH ONCE DAILY 30 tablet 5  . glucose blood (ACCU-CHEK AVIVA PLUS) test strip Use as instructed 100 each 4  . hydrochlorothiazide (HYDRODIURIL) 25 MG tablet TAKE ONE TABLET BY MOUTH ONCE DAILY 30 tablet 5  . losartan (COZAAR) 100 MG tablet Take 1 tablet (100 mg total) by mouth daily. 30 tablet 5  . pravastatin (PRAVACHOL) 40 MG tablet TAKE ONE TABLET BY MOUTH ONCE DAILY 30 tablet 5  . sitaGLIPtin (JANUVIA) 50 MG tablet Take 1  tablet (50 mg total) by mouth daily. 30 tablet 5   No current facility-administered medications for this visit.    REVIEW OF SYSTEMS:   Constitutional: Denies fevers, chills Eyes: Denies blurriness of vision, double vision or watery eyes Ears, nose, mouth, throat, and face: Denies mucositis or sore throat Respiratory: Denies cough, dyspnea or wheezes Cardiovascular: Denies palpitation, chest discomfort or lower extremity swelling Gastrointestinal:  Denies nausea, heartburn or change in bowel habits Skin: Denies abnormal skin rashes Neurological:Denies numbness, tingling or new weaknesses Behavioral/Psych: Mood is stable, no new changes  All other systems  were reviewed with the patient and are negative.  PHYSICAL EXAMINATION: ECOG PERFORMANCE STATUS: 1 - Symptomatic but completely ambulatory  Filed Vitals:   11/04/14 1300  BP: 150/50  Pulse: 109  Temp: 98.2 F (36.8 C)  Resp: 20   Filed Weights   11/04/14 1300  Weight: 165 lb 11.2 oz (75.161 kg)    GENERAL:alert, no distress and comfortable SKIN: skin color, texture, turgor are normal, no rashes or significant lesions EYES: normal, conjunctiva are pink and non-injected, sclera clear OROPHARYNX:no exudate, no erythema and lips, buccal mucosa, and tongue normal  NECK: supple, thyroid normal size, non-tender, without nodularity LYMPH: Diffuse lymphadenopathy were palpable in both sides of the neck, axilla and inguinal region.  LUNGS: clear to auscultation and percussion with normal breathing effort HEART: regular rate & rhythm and no murmurs and no lower extremity edema ABDOMEN:abdomen soft, non-tender and normal bowel sounds Musculoskeletal:no cyanosis of digits and no clubbing  PSYCH: alert & oriented x 3 with fluent speech NEURO: no focal motor/sensory deficits  LABORATORY DATA:  I have reviewed the data as listed Lab Results  Component Value Date   WBC 11.9* 10/31/2014   HGB 9.8* 10/31/2014   HCT 30.7* 10/31/2014   MCV 82.8 10/31/2014    Recent Labs  12/18/13 0942 03/18/14 1523 07/17/14 09/24/14 1610 10/31/14 1114  NA 136 138 139 139 138  K 4.7 4.4 4.5 4.1 4.5  CL 103 102  --  103 105  CO2 24 25  --  29 21  GLUCOSE 133* 70  --  74 77  BUN 41* 32* 26* 33* 43*  CREATININE 2.42* 2.07* 2.1* 2.07* 2.30*  CALCIUM 9.6 9.8  --  9.4 8.8  PROT 7.4 8.0  --  7.7  --   ALBUMIN 4.4 4.8  --  4.5  --   AST 25 25  --  25  --   ALT 28 32  --  30  --   ALKPHOS 52 63  --  68  --   BILITOT 0.6 0.4  --  0.7  --     RADIOGRAPHIC STUDIES: I have personally reviewed the radiological images as listed and agreed with the findings in the report. Dg Chest 2 View  10/31/2014    CLINICAL DATA:  Shortness of breath today.  EXAM: CHEST  2 VIEW  COMPARISON:  PA and lateral chest 09/17/2010.  FINDINGS: Patchy airspace disease is seen in the right lung base. Mild streaky opacity is also seen in the left base. There is a small right pleural effusion. No pneumothorax identified. Heart size is normal.  IMPRESSION: Small right pleural effusion streaky airspace opacity could be due to atelectasis or pneumonia. Recommend followup to clearing.   Electronically Signed   By: Inge Rise M.D.   On: 10/31/2014 10:41   Ct Chest Wo Contrast  10/31/2014   CLINICAL DATA:  Palpable adenopathy. Dyspnea on exertion for  2 weeks. Chest tightness  EXAM: CT CHEST WITHOUT CONTRAST  TECHNIQUE: Multidetector CT imaging of the chest was performed following the standard protocol without IV contrast.  COMPARISON:  Chest radiograph October 31, 2014  FINDINGS: On axial slice 30 series 5, there is a 1.0 x 0.6 cm nodular opacity in the superior segment of the right lower lobe. There is patchy atelectasis in the lung bases, slightly more on the right than on the left. There is no lung edema or consolidation.  There is widespread adenopathy. Adenopathy is identified in the lower neck and supraclavicular regions bilaterally. Multiple enlarged lymph nodes are noted in each axillary region. The largest individual lymph node in the right axilla measures 2.6 x 1.7 cm. The largest individual lymph node in the left axilla measures 2.9 x 1.9 cm. There are lymph nodes throughout the anterior mediastinum with the largest individual lymph node in the anterior mediastinum measuring 2.0 x 1.7 cm. Lymph nodes are noted throughout the paratracheal regions with the largest individual lymph node anterior to the carina on the right measuring 2.7 x 2.0 cm. There are multiple lymph nodes adjacent to the aortic arch with the largest individual lymph node in this region measuring 2.6 x 2.1 cm. There are several prominent lymph nodes in the  hilar regions which are rather difficult to discern from the adjacent vessels to measure accurately. There is extensive sub- carinal adenopathy. The largest individual lymph node in the sub- carinal region measures 3.9 x 2.2 cm.  Thyroid appears unremarkable. Pericardium is not thickened. There is atherosclerotic change in the aortic arch. No thoracic aortic aneurysm.  Near the gastroesophageal junction, there are several retrocrural lymph nodes, largest on the left measuring 1.7 x 1.3 cm. In the visualized upper abdomen, there is extensive adenopathy in the periportal region and surrounding the pancreas in the visualized regions. The largest individual lymph node in this area measures 2.9 x 2.3 cm. The spleen is incompletely visualized but appears enlarged, measuring approximately 14 x 9 x 9 cm.  There is degenerative change in the thoracic spine. There are no blastic or lytic bone lesions.  IMPRESSION: Extensive adenopathy throughout the chest and upper abdomen. Spleen also appears prominent. These findings in combination raise concern for lymphoma.  There is the 1.0 x 0.6 cm nodular opacity in the superior segment right lower lobe. No other parenchymal nodular lesion is identified. PET-CT could be helpful to assess for increased metabolic activity in this nodular opacity as well as to determine the extent of adenopathy throughout the body.  No edema or consolidation.  There is mild bibasilar atelectasis.   Electronically Signed   By: Lowella Grip III M.D.   On: 10/31/2014 16:09   Nm Pulmonary Perf And Vent  10/31/2014   CLINICAL DATA:  Elevated D-dimer.  Short of breath.  EXAM: NUCLEAR MEDICINE VENTILATION - PERFUSION LUNG SCAN  TECHNIQUE: Ventilation images were obtained in multiple projections using inhaled aerosol technetium 99 M DTPA. Perfusion images were obtained in multiple projections after intravenous injection of Tc-66mMAA.  RADIOPHARMACEUTICALS:  40.2 mCi Technetium-933mTPA aerosol inhalation  and 5.2 mCi Technetium-9939mA IV  COMPARISON:  Chest x-ray today  FINDINGS: Ventilation: Right lower lobe defect compatible with atelectasis on chest x-ray.  Perfusion: Right lower lobe defect compatible with atelectasis on chest x-ray  IMPRESSION: Matched defect right lower lobe compatible with atelectasis. Low probability for pulmonary embolism.   Electronically Signed   By: ChaFranchot GalloD.   On: 10/31/2014 16:32  ASSESSMENT & PLAN:  Lymphoma His overall presentation with diffuse lymphadenopathy, regular night sweats and significant anemia highly suspicious for lymphoma. I will proceed to order a staging PET CT scan and excisional lymph node biopsy by general surgery. I plan to see him in about 2 weeks for further assessment and hopefully would have more information by then.   Chronic kidney disease, stage III (moderate) He has chronic kidney disease and this is unrelated to the lymphoma. I recommend PET CT scan for staging rather than CT scan due to chronic kidney disease.   Anemia in neoplastic disease I suspect the cause of anemia I will likely combination of anemia of chronic disease and possible bone marrow infiltration by lymphoma. I present time, he is not symptomatic. He can be observed until further workup.      All questions were answered. The patient knows to call the clinic with any problems, questions or concerns. I spent 55 minutes counseling the patient face to face. The total time spent in the appointment was 60 minutes and more than 50% was on counseling.     [facility], Lamarcus Spira, MD 11/04/2014 3:40 PM

## 2014-11-04 NOTE — Assessment & Plan Note (Signed)
I suspect the cause of anemia I will likely combination of anemia of chronic disease and possible bone marrow infiltration by lymphoma. I present time, he is not symptomatic. He can be observed until further workup.

## 2014-11-04 NOTE — Telephone Encounter (Signed)
Spoke with patient. Sees hem/onc this afternoon. He's feeling positive thus far. Encouraged him to let me know if there is anything he needs from me, and advised that I will be following along with Dr. Calton Dach notes.   His wife completes her breast cancer treatment next week.

## 2014-11-05 ENCOUNTER — Other Ambulatory Visit: Payer: Self-pay | Admitting: General Surgery

## 2014-11-13 ENCOUNTER — Other Ambulatory Visit: Payer: Self-pay | Admitting: General Surgery

## 2014-11-13 ENCOUNTER — Ambulatory Visit (HOSPITAL_COMMUNITY)
Admission: RE | Admit: 2014-11-13 | Discharge: 2014-11-13 | Disposition: A | Discharge status: Home / Self Care | Payer: Medicare Other | Source: Ambulatory Visit | Attending: Hematology and Oncology | Admitting: Hematology and Oncology

## 2014-11-13 ENCOUNTER — Telehealth: Payer: Self-pay | Admitting: Hematology and Oncology

## 2014-11-13 DIAGNOSIS — C8590 Non-Hodgkin lymphoma, unspecified, unspecified site: Secondary | ICD-10-CM

## 2014-11-13 LAB — GLUCOSE, CAPILLARY: Glucose-Capillary: 85 mg/dL (ref 70–99)

## 2014-11-13 MED ORDER — FLUDEOXYGLUCOSE F - 18 (FDG) INJECTION: 8.0000 | Freq: Once | INTRAVENOUS | Status: AC | PRN

## 2014-11-13 NOTE — Telephone Encounter (Signed)
I reviewed his PET CT scan. The patient has extensive disease and will likely need systemic chemotherapy. I have sent his surgeon a message to see if the patient can have port placement at the same time. He is aware of the plan of care and I will have his appointment moved since his surgery is not until 5/17.

## 2014-11-13 NOTE — Telephone Encounter (Signed)
s.w pt and advised on May appt.....pt ok and aware °

## 2014-11-14 ENCOUNTER — Encounter (HOSPITAL_COMMUNITY)
Admission: RE | Admit: 2014-11-14 | Discharge: 2014-11-14 | Disposition: A | Discharge status: Home / Self Care | Payer: Medicare Other | Source: Ambulatory Visit | Attending: General Surgery | Admitting: General Surgery

## 2014-11-14 ENCOUNTER — Encounter (HOSPITAL_COMMUNITY): Payer: Self-pay

## 2014-11-14 ENCOUNTER — Ambulatory Visit (HOSPITAL_COMMUNITY)
Admission: RE | Admit: 2014-11-14 | Discharge: 2014-11-14 | Disposition: A | Discharge status: Home / Self Care | Payer: Medicare Other | Source: Ambulatory Visit | Attending: Anesthesiology | Admitting: Anesthesiology

## 2014-11-14 DIAGNOSIS — C8590 Non-Hodgkin lymphoma, unspecified, unspecified site: Secondary | ICD-10-CM | POA: Diagnosis not present

## 2014-11-14 DIAGNOSIS — R9389 Abnormal findings on diagnostic imaging of other specified body structures: Secondary | ICD-10-CM

## 2014-11-14 HISTORY — DX: Cramp and spasm: R25.2

## 2014-11-14 HISTORY — DX: Reserved for inherently not codable concepts without codable children: IMO0001

## 2014-11-14 HISTORY — DX: Hyperparathyroidism, unspecified: E21.3

## 2014-11-14 HISTORY — DX: Anemia, unspecified: D64.9

## 2014-11-14 LAB — CBC
HCT: 28.1 % — ABNORMAL LOW (ref 39.0–52.0)
HEMOGLOBIN: 8.8 g/dL — AB (ref 13.0–17.0)
MCH: 25.4 pg — ABNORMAL LOW (ref 26.0–34.0)
MCHC: 31.3 g/dL (ref 30.0–36.0)
MCV: 81.2 fL (ref 78.0–100.0)
PLATELETS: 123 10*3/uL — AB (ref 150–400)
RBC: 3.46 MIL/uL — AB (ref 4.22–5.81)
RDW: 15.9 % — AB (ref 11.5–15.5)
WBC: 12.2 10*3/uL — ABNORMAL HIGH (ref 4.0–10.5)

## 2014-11-14 LAB — BASIC METABOLIC PANEL
Anion gap: 10 (ref 5–15)
BUN: 53 mg/dL — AB (ref 6–20)
CALCIUM: 8.7 mg/dL — AB (ref 8.9–10.3)
CO2: 22 mmol/L (ref 22–32)
CREATININE: 2.7 mg/dL — AB (ref 0.61–1.24)
Chloride: 102 mmol/L (ref 101–111)
GFR calc Af Amer: 27 mL/min — ABNORMAL LOW (ref >60)
GFR, EST NON AFRICAN AMERICAN: 23 mL/min — AB (ref >60)
GLUCOSE: 159 mg/dL — AB (ref 65–99)
Potassium: 4.8 mmol/L (ref 3.5–5.1)
Sodium: 134 mmol/L — ABNORMAL LOW (ref 135–145)

## 2014-11-14 NOTE — Progress Notes (Signed)
EKG per epic 10/31/2014 Stress Test / epic 05/08/2003 and 11/08/2007 OV note per epic per Dr Addison Bailey 11/04/2014  H&P per [facility] 07/17/2014 on chart

## 2014-11-14 NOTE — Patient Instructions (Signed)
18 [name] [name]  11/14/2014   Your procedure is scheduled on:   Tuesday Nov 19, 2014   Report to [facility] Main Entrance and follow signs to  [place] arrive at 10:30 AM.   Call this number if you have problems the morning of surgery [phone] or Presurgical Testing [phone].   Remember:  Do not eat food After Midnight but may take clear liquids till 7:00 am day of surgery then nothing by mouth. EAT A HEALTHY SNACK NIGHT PRIOR TO SURGERY.      Take these medicines the morning of surgery with A SIP OF WATER: Amlodipine (Norvasc)                               You may not have any metal on your body including hair pins and piercings  Do not wear jewelry, colognes, lotions, powders, or deodorant.  Men may shave face and neck.                          ONLY ONE PERSON WITH YOU IN SHORT STAY MORNING OF SURGERY. IF TIME PERMITS AFTER YOU ARE READY, OTHERS MAY VISIT. NO MORE THAN 2 PERSONS IN ROOM AT A TIME.    Do not bring valuables to the hospital. [place].  Contacts, dentures or bridgework may not be worn into surgery.    Patients discharged the day of surgery will not be allowed to drive home.  Name and phone number of your driver:Linda Shabazz (wife)   ________________________________________________________________________  [facility] - Preparing for Surgery Before surgery, you can play an important role.  Because skin is not sterile, your skin needs to be as free of germs as possible.  You can reduce the number of germs on your skin by washing with CHG (chlorahexidine gluconate) soap before surgery.  CHG is an antiseptic cleaner which kills germs and bonds with the skin to continue killing germs even after washing. Please DO NOT use if you have an allergy to CHG or antibacterial soaps.  If your skin becomes reddened/irritated stop using the CHG and inform your nurse when you arrive at Short Stay. Do not shave (including  legs and underarms) for at least 48 hours prior to the first CHG shower.  You may shave your face/neck. Please follow these instructions carefully:  1.  Shower with CHG Soap the night before surgery and the  morning of Surgery.  2.  If you choose to wash your hair, wash your hair first as usual with your  normal  shampoo.  3.  After you shampoo, rinse your hair and body thoroughly to remove the  shampoo.                           4.  Use CHG as you would any other liquid soap.  You can apply chg directly  to the skin and wash                       Gently with a scrungie or clean washcloth.  5.  Apply the CHG Soap to your body ONLY FROM THE NECK DOWN.   Do not use on face/ open  Wound or open sores. Avoid contact with eyes, ears mouth and genitals (private parts).                       Wash face,  Genitals (private parts) with your normal soap.             6.  Wash thoroughly, paying special attention to the area where your surgery  will be performed.  7.  Thoroughly rinse your body with warm water from the neck down.  8.  DO NOT shower/wash with your normal soap after using and rinsing off  the CHG Soap.                9.  Pat yourself dry with a clean towel.            10.  Wear clean pajamas.            11.  Place clean sheets on your bed the night of your first shower and do not  sleep with pets. Day of Surgery : Do not apply any lotions/deodorants the morning of surgery.  Please wear clean clothes to the hospital/surgery center.  FAILURE TO FOLLOW THESE INSTRUCTIONS MAY RESULT IN THE CANCELLATION OF YOUR SURGERY PATIENT SIGNATURE_________________________________  NURSE SIGNATURE__________________________________  ________________________________________________________________________    CLEAR LIQUID DIET   Foods Allowed                                                                     Foods Excluded  Coffee and tea, regular and decaf                              liquids that you cannot  Plain Jell-O in any flavor                                             see through such as: Fruit ices (not with fruit pulp)                                     milk, soups, orange juice  Iced Popsicles                                    All solid food Carbonated beverages, regular and diet                                    Cranberry, grape and apple juices Sports drinks like Gatorade Lightly seasoned clear broth or consume(fat free) Sugar, honey syrup  Sample Menu Breakfast                                Lunch  Supper Cranberry juice                    Beef broth                            Chicken broth Jell-O                                     Grape juice                           Apple juice Coffee or tea                        Jell-O                                      Popsicle                                                Coffee or tea                        Coffee or tea  _____________________________________________________________________

## 2014-11-14 NOTE — Progress Notes (Addendum)
CBC CMP and CXR results in epic per PAT visit 11/14/2014 sent to Dr Dalbert Batman

## 2014-11-15 NOTE — Progress Notes (Addendum)
Spoke with Dr Thomes Cake / Anesthesia in regards to pts H&P/surgical procedure/lab results / epic from PAT visit 11/14/2014/CXR/epic from PAT visit 11/14/2014 and 10/31/2014 /CT of chest/epic 10/31/2014 and PET results / epic 11/13/2014;also reviewed Dr Calton Dach visit note from 11/04/2014/epic. Anesthesia to see pt day of surgery.

## 2014-11-17 NOTE — H&P (Signed)
[name] [name]. [facility]  Location: [facility] Patient #: [id number] DOB: 28-Sep-1950 Married / Language: English / Race: Black or African American Male        History of Present Illness .  The patient is a 64 year old male who presents with a complaint of diffuse lymphadenopathy. This is a 64 year old African-American man who is here with his wife. He was referred by Dr. Alvy Bimler for excisional lymph node biopsy. Lymphoma is suspected. About 4 weeks ago he thought he had the flu. Cough and sweats. 5 pound weight loss. He noted swelling in his neck, under his arms, and his groins. Initially evaluated at urgent medical care. Sedimentation rate and d-dimer elevated. Troponin normal. Creatinine 2.3. WBC 11.9. Hemoglobin 9.8. Platelet count 201,000. Ventilation perfusion scan was negative. CT scan shows enlarged lymph nodes and chest and upper abdomen and the spleen is prominent. He has seen Dr. Alvy Bimler requested axillary lymph node biopsy. Lymphoma is suspected. Anemia is felt to be due to bone marrow involvement. A PET scan is pending. Comorbidities include former tobacco use, quit 2010. Former alcohol abuse, quit 1999. Hyperlipidemia. CKD stage III. 2 diabetes with renal complication. Hypertension. Anemia of chronic disease. Left forearm surgery for chainsaw accident. Married with 2 children. Used to work for the city now retired. Mother died of unknown cause, sudden death. Father died colon cancer. He is left-handed. We plan a right axillary lymph node biopsy  I discussed the indications, details, techniques, and numerous risk of right axillary lymph node excision. He is aware of the risk of bleeding, infection, skin necrosis, nerve damage with chronic pain or numbness, arm swelling, injury to vascular or neurologic structures, seroma formation, and other unforeseen problems. He understands all of these issues. At this time all of his questions are  answered. He agrees with this plan. It is his request to have this done at [place] [facility].       Other Problems  Diabetes Mellitus High blood pressure  Diagnostic Studies History  Colonoscopy 5-10 years ago  Allergies  ACE Inhibitors  Medication History  Accu-Chek Aviva Plus (In Vitro) Active. AmLODIPine Besylate ('10MG'$  Tablet, Oral daily) Active. GlipiZIDE ER ('10MG'$  Tablet ER 24HR, Oral daily) Active. Hydrochlorothiazide ('25MG'$  Tablet, Oral daily) Active. Pravastatin Sodium ('40MG'$  Tablet, Oral daily) Active. Losartan Potassium ('100MG'$  Tablet, Oral daily) Active. Januvia ('50MG'$  Tablet, Oral daily) Active. Aspirin Low Strength ('81MG'$  Tablet Chewable, Oral daily) Active. Fish Oil ('1000MG'$  Capsule, Oral daily) Active.  Social History  Alcohol use Remotely quit alcohol use. Tobacco use Former smoker.  Family History  Alcohol Abuse Brother. Breast Cancer Sister. Cerebrovascular Accident Brother. Colon Cancer Father. Seizure disorder Daughter.  Review of Systems  General Present- Fever and Night Sweats. Not Present- Appetite Loss, Chills, Fatigue, Weight Gain and Weight Loss. Skin Not Present- Change in Wart/Mole, Dryness, Hives, Jaundice, New Lesions, Non-Healing Wounds, Rash and Ulcer. HEENT Not Present- Earache, Hearing Loss, Hoarseness, Nose Bleed, Oral Ulcers, Ringing in the Ears, Seasonal Allergies, Sinus Pain, Sore Throat, Visual Disturbances, Wears glasses/contact lenses and Yellow Eyes. Respiratory Present- Wheezing. Not Present- Bloody sputum, Chronic Cough, Difficulty Breathing and Snoring. Breast Not Present- Breast Mass, Breast Pain, Nipple Discharge and Skin Changes. Cardiovascular Not Present- Chest Pain, Difficulty Breathing Lying Down, Leg Cramps, Palpitations, Rapid Heart Rate, Shortness of Breath and Swelling of Extremities. Gastrointestinal Not Present- Abdominal Pain, Bloating, Bloody Stool, Change in Bowel Habits, Chronic diarrhea,  Constipation, Difficulty Swallowing, Excessive gas, Gets full quickly at meals, Hemorrhoids, Indigestion, Nausea, Rectal Pain and Vomiting.  Male Genitourinary Not Present- Blood in Urine, Change in Urinary Stream, Frequency, Impotence, Nocturia, Painful Urination, Urgency and Urine Leakage.   Vitals   Weight: 163.38 lb Height: 71in Body Surface Area: 1.93 m Body Mass Index: 22.79 kg/m Temp.: 98.62F(Oral)  Pulse: 88 (Regular)  BP: 138/62 (Sitting, Right Arm, Standard)    Physical Exam  General Mental Status-Alert. General Appearance-Consistent with stated age. Hydration-Well hydrated. Voice-Normal.  Head and Neck Trachea-midline. Thyroid Gland Characteristics - normal size and consistency. Note: Palpable cervical adenopathy, behind the angle of mandible, anterior triangle, posterior triangle. Nontender. Nothing larger than 2 cm.   Eye Eyeball - Bilateral-Extraocular movements intact. Sclera/Conjunctiva - Bilateral-No scleral icterus.  Chest and Lung Exam Chest and lung exam reveals -quiet, even and easy respiratory effort with no use of accessory muscles and on auscultation, normal breath sounds, no adventitious sounds and normal vocal resonance. Inspection Chest Wall - Normal. Back - normal. Note: Significant bilateral axillary adenopathy, lymph nodes 3 cm or greater. Mobile and nontender. No obvious arm swelling.   Cardiovascular Cardiovascular examination reveals -normal heart sounds, regular rate and rhythm with no murmurs and normal pedal pulses bilaterally.  Abdomen Inspection Inspection of the abdomen reveals - No Hernias. Skin - Scar - no surgical scars. Palpation/Percussion Palpation and Percussion of the abdomen reveal - Soft, Non Tender, No Rebound tenderness, No Rigidity (guarding) and No hepatosplenomegaly. Auscultation Auscultation of the abdomen reveals - Bowel sounds normal.  Male Genitourinary Note: Bilateral  inguinal adenopathy.   Neurologic Neurologic evaluation reveals -alert and oriented x 3 with no impairment of recent or remote memory. Mental Status-Normal.  Musculoskeletal Normal Exam - Left-Upper Extremity Strength Normal and Lower Extremity Strength Normal. Normal Exam - Right-Upper Extremity Strength Normal and Lower Extremity Strength Normal.  Lymphatic Head & Neck  General Head & Neck Lymphatics: Bilateral - Description - Generalized lymphadenopathy. Axillary  General Axillary Region: Bilateral - Description - Generalized lymphadenopathy. Tenderness - Non Tender. Femoral & Inguinal  Generalized Femoral & Inguinal Lymphatics: Bilateral - Description - Generalized lymphadenopathy. Tenderness - Non Tender.    Assessment & Plan  LYMPHADENOPATHY, AXILLARY (785.6  R59.0)   Schedule for Surgery You have developed abnormally enlarged lymph node in your neck, under both arms, and in the groins. Your CT scan suggests enlarged lymph nodes in the abdomen and chest as well. Your hematologist is suspicious of lymphoma you will be scheduled for excision of a deep right axillary lymph node under general anesthesia in the near future. We have discussed the techniques and risks of this surgery in detail. Pt Education - Lymph Nodes, Enlarged: enlarged lymph node  CKD (CHRONIC KIDNEY DISEASE), STAGE III (585.3  N18.3)  DM (DIABETES MELLITUS), TYPE 2 WITH RENAL COMPLICATIONS (277.41  O87.86)  ANEMIA, CHRONIC DISEASE (285.29  D63.8)  HYPERTENSION, BENIGN (401.1  I10)  Addendum:  Dr Alvy Bimler called and requested port a cath due to  the presence of diffuse disease seen on PET/CT.  PATIENT IS AWARE OF THIS AND IS COUNSELED ABOUT PORT AND TECHNIQUES  OF INSERTION AND NUMEROUS RISKS. He agrees with plan.

## 2014-11-18 ENCOUNTER — Ambulatory Visit: Payer: Medicare Other | Admitting: Hematology and Oncology

## 2014-11-19 ENCOUNTER — Ambulatory Visit (HOSPITAL_COMMUNITY): Payer: Medicare Other

## 2014-11-19 ENCOUNTER — Ambulatory Visit (HOSPITAL_COMMUNITY): Payer: Medicare Other | Admitting: Anesthesiology

## 2014-11-19 ENCOUNTER — Encounter (HOSPITAL_COMMUNITY)
Admission: RE | Disposition: A | Discharge status: Home / Self Care | Payer: Self-pay | Source: Ambulatory Visit | Attending: General Surgery

## 2014-11-19 ENCOUNTER — Encounter (HOSPITAL_COMMUNITY): Payer: Self-pay | Admitting: *Deleted

## 2014-11-19 ENCOUNTER — Ambulatory Visit (HOSPITAL_COMMUNITY)
Admission: RE | Admit: 2014-11-19 | Discharge: 2014-11-19 | Disposition: A | Discharge status: Home / Self Care | Payer: Medicare Other | Source: Ambulatory Visit | Attending: General Surgery | Admitting: General Surgery

## 2014-11-19 DIAGNOSIS — Z7982 Long term (current) use of aspirin: Secondary | ICD-10-CM | POA: Insufficient documentation

## 2014-11-19 DIAGNOSIS — E1122 Type 2 diabetes mellitus with diabetic chronic kidney disease: Secondary | ICD-10-CM | POA: Insufficient documentation

## 2014-11-19 DIAGNOSIS — E785 Hyperlipidemia, unspecified: Secondary | ICD-10-CM | POA: Insufficient documentation

## 2014-11-19 DIAGNOSIS — Z87891 Personal history of nicotine dependence: Secondary | ICD-10-CM | POA: Insufficient documentation

## 2014-11-19 DIAGNOSIS — Z79899 Other long term (current) drug therapy: Secondary | ICD-10-CM | POA: Insufficient documentation

## 2014-11-19 DIAGNOSIS — N183 Chronic kidney disease, stage 3 (moderate): Secondary | ICD-10-CM | POA: Insufficient documentation

## 2014-11-19 DIAGNOSIS — Z888 Allergy status to other drugs, medicaments and biological substances status: Secondary | ICD-10-CM | POA: Insufficient documentation

## 2014-11-19 DIAGNOSIS — Z95828 Presence of other vascular implants and grafts: Secondary | ICD-10-CM

## 2014-11-19 DIAGNOSIS — I129 Hypertensive chronic kidney disease with stage 1 through stage 4 chronic kidney disease, or unspecified chronic kidney disease: Secondary | ICD-10-CM | POA: Insufficient documentation

## 2014-11-19 DIAGNOSIS — J189 Pneumonia, unspecified organism: Secondary | ICD-10-CM | POA: Insufficient documentation

## 2014-11-19 DIAGNOSIS — D638 Anemia in other chronic diseases classified elsewhere: Secondary | ICD-10-CM | POA: Diagnosis not present

## 2014-11-19 DIAGNOSIS — F1010 Alcohol abuse, uncomplicated: Secondary | ICD-10-CM | POA: Insufficient documentation

## 2014-11-19 DIAGNOSIS — C8334 Diffuse large B-cell lymphoma, lymph nodes of axilla and upper limb: Secondary | ICD-10-CM | POA: Insufficient documentation

## 2014-11-19 DIAGNOSIS — C8590 Non-Hodgkin lymphoma, unspecified, unspecified site: Secondary | ICD-10-CM

## 2014-11-19 DIAGNOSIS — C8338 Diffuse large B-cell lymphoma, lymph nodes of multiple sites: Secondary | ICD-10-CM | POA: Diagnosis present

## 2014-11-19 DIAGNOSIS — R590 Localized enlarged lymph nodes: Secondary | ICD-10-CM | POA: Diagnosis present

## 2014-11-19 HISTORY — PX: AXILLARY LYMPH NODE BIOPSY: SHX5737

## 2014-11-19 HISTORY — PX: PORTACATH PLACEMENT: SHX2246

## 2014-11-19 LAB — GLUCOSE, CAPILLARY: Glucose-Capillary: 151 mg/dL — ABNORMAL HIGH (ref 65–99)

## 2014-11-19 SURGERY — AXILLARY LYMPH NODE BIOPSY
Anesthesia: General | Site: Chest | Laterality: Right

## 2014-11-19 MED ORDER — MIDAZOLAM HCL 2 MG/2ML IJ SOLN
INTRAMUSCULAR | Status: AC
  Filled 2014-11-19: qty 2

## 2014-11-19 MED ORDER — HEPARIN SOD (PORK) LOCK FLUSH 100 UNIT/ML IV SOLN
INTRAVENOUS | Status: DC | PRN
  Administered 2014-11-19: 500 [IU] via INTRAVENOUS

## 2014-11-19 MED ORDER — PROPOFOL 10 MG/ML IV BOLUS
INTRAVENOUS | Status: AC
  Filled 2014-11-19: qty 20

## 2014-11-19 MED ORDER — FENTANYL CITRATE (PF) 100 MCG/2ML IJ SOLN
INTRAMUSCULAR | Status: DC | PRN
  Administered 2014-11-19: 50 ug via INTRAVENOUS
  Administered 2014-11-19: 25 ug via INTRAVENOUS
  Administered 2014-11-19: 100 ug via INTRAVENOUS
  Administered 2014-11-19: 25 ug via INTRAVENOUS

## 2014-11-19 MED ORDER — BUPIVACAINE-EPINEPHRINE 0.5% -1:200000 IJ SOLN
INTRAMUSCULAR | Status: DC | PRN
  Administered 2014-11-19: 8 mL

## 2014-11-19 MED ORDER — HYDROMORPHONE HCL 1 MG/ML IJ SOLN: 0.2500 mg | INTRAMUSCULAR | Status: DC | PRN

## 2014-11-19 MED ORDER — ONDANSETRON HCL 4 MG/2ML IJ SOLN
INTRAMUSCULAR | Status: DC | PRN
  Administered 2014-11-19: 4 mg via INTRAVENOUS

## 2014-11-19 MED ORDER — BUPIVACAINE-EPINEPHRINE (PF) 0.5% -1:200000 IJ SOLN
INTRAMUSCULAR | Status: AC
  Filled 2014-11-19: qty 30

## 2014-11-19 MED ORDER — PROPOFOL 10 MG/ML IV BOLUS
INTRAVENOUS | Status: DC | PRN
  Administered 2014-11-19: 150 mg via INTRAVENOUS

## 2014-11-19 MED ORDER — CHLORHEXIDINE GLUCONATE 4 % EX LIQD: 1.0000 "application " | Freq: Once | CUTANEOUS | Status: DC

## 2014-11-19 MED ORDER — CEFAZOLIN SODIUM-DEXTROSE 2-3 GM-% IV SOLR
2.0000 g | INTRAVENOUS | Status: AC
  Administered 2014-11-19: 2 g via INTRAVENOUS

## 2014-11-19 MED ORDER — ROCURONIUM BROMIDE 100 MG/10ML IV SOLN
INTRAVENOUS | Status: AC
  Filled 2014-11-19: qty 1

## 2014-11-19 MED ORDER — CEFAZOLIN SODIUM-DEXTROSE 2-3 GM-% IV SOLR: 2.0000 g | INTRAVENOUS | Status: DC

## 2014-11-19 MED ORDER — FENTANYL CITRATE (PF) 100 MCG/2ML IJ SOLN: 25.0000 ug | INTRAMUSCULAR | Status: DC | PRN

## 2014-11-19 MED ORDER — HYDROCODONE-ACETAMINOPHEN 5-325 MG PO TABS: 1.0000 | ORAL_TABLET | Freq: Four times a day (QID) | ORAL | Status: DC | PRN

## 2014-11-19 MED ORDER — LIDOCAINE HCL (CARDIAC) 20 MG/ML IV SOLN
INTRAVENOUS | Status: DC | PRN
  Administered 2014-11-19: 100 mg via INTRAVENOUS

## 2014-11-19 MED ORDER — BUPIVACAINE-EPINEPHRINE (PF) 0.5% -1:200000 IJ SOLN
INTRAMUSCULAR | Status: DC | PRN
  Administered 2014-11-19: 10 mL

## 2014-11-19 MED ORDER — LACTATED RINGERS IV SOLN
INTRAVENOUS | Status: DC
  Administered 2014-11-19: 1000 mL via INTRAVENOUS

## 2014-11-19 MED ORDER — ROCURONIUM BROMIDE 100 MG/10ML IV SOLN
INTRAVENOUS | Status: DC | PRN
Start: 2014-11-19 — End: 2014-11-19
  Administered 2014-11-19: 40 mg via INTRAVENOUS

## 2014-11-19 MED ORDER — LIDOCAINE HCL (CARDIAC) 20 MG/ML IV SOLN
INTRAVENOUS | Status: AC
  Filled 2014-11-19: qty 5

## 2014-11-19 MED ORDER — NEOSTIGMINE METHYLSULFATE 10 MG/10ML IV SOLN
INTRAVENOUS | Status: DC | PRN
  Administered 2014-11-19: 3 mg via INTRAVENOUS

## 2014-11-19 MED ORDER — CEFAZOLIN SODIUM-DEXTROSE 2-3 GM-% IV SOLR
INTRAVENOUS | Status: AC
  Filled 2014-11-19: qty 50

## 2014-11-19 MED ORDER — GLYCOPYRROLATE 0.2 MG/ML IJ SOLN
INTRAMUSCULAR | Status: DC | PRN
  Administered 2014-11-19: .4 mg via INTRAVENOUS

## 2014-11-19 MED ORDER — SODIUM CHLORIDE 0.9 % IR SOLN
Freq: Once | Status: AC
  Administered 2014-11-19: 500 mL
  Filled 2014-11-19: qty 1.2

## 2014-11-19 MED ORDER — MIDAZOLAM HCL 5 MG/5ML IJ SOLN
INTRAMUSCULAR | Status: DC | PRN
  Administered 2014-11-19: 2 mg via INTRAVENOUS

## 2014-11-19 MED ORDER — 0.9 % SODIUM CHLORIDE (POUR BTL) OPTIME
TOPICAL | Status: DC | PRN
  Administered 2014-11-19: 1000 mL

## 2014-11-19 MED ORDER — FENTANYL CITRATE (PF) 250 MCG/5ML IJ SOLN
INTRAMUSCULAR | Status: AC
Start: 2014-11-19 — End: 2014-11-19
  Filled 2014-11-19: qty 5

## 2014-11-19 MED ORDER — BUPIVACAINE-EPINEPHRINE (PF) 0.25% -1:200000 IJ SOLN
INTRAMUSCULAR | Status: AC
  Filled 2014-11-19: qty 30

## 2014-11-19 MED ORDER — HEPARIN SOD (PORK) LOCK FLUSH 100 UNIT/ML IV SOLN
INTRAVENOUS | Status: AC
  Filled 2014-11-19: qty 5

## 2014-11-19 SURGICAL SUPPLY — 48 items
APL SKNCLS STERI-STRIP NONHPOA (GAUZE/BANDAGES/DRESSINGS) ×2
APPLIER CLIP 11 MED OPEN (CLIP)
APPLIER CLIP 9.375 MED OPEN (MISCELLANEOUS) ×3
APR CLP MED 11 20 MLT OPN (CLIP)
APR CLP MED 9.3 20 MLT OPN (MISCELLANEOUS) ×2
BAG DECANTER FOR FLEXI CONT (MISCELLANEOUS) ×3 IMPLANT
BENZOIN TINCTURE PRP APPL 2/3 (GAUZE/BANDAGES/DRESSINGS) ×3 IMPLANT
BLADE HEX COATED 2.75 (ELECTRODE) ×3 IMPLANT
BLADE SURG 15 STRL LF DISP TIS (BLADE) ×2 IMPLANT
BLADE SURG 15 STRL SS (BLADE) ×3
BLADE SURG SZ10 CARB STEEL (BLADE) ×3 IMPLANT
BOOT SUTURE VASCULAR YLW (MISCELLANEOUS) ×3
CLAMP SUTURE YELLOW 5 PAIRS (MISCELLANEOUS) IMPLANT
CLIP APPLIE 11 MED OPEN (CLIP) IMPLANT
CLIP APPLIE 9.375 MED OPEN (MISCELLANEOUS) IMPLANT
DECANTER SPIKE VIAL GLASS SM (MISCELLANEOUS) ×3 IMPLANT
DRAPE C-ARM 42X120 X-RAY (DRAPES) ×3 IMPLANT
DRAPE LAPAROSCOPIC ABDOMINAL (DRAPES) ×3 IMPLANT
ELECT REM PT RETURN 9FT ADLT (ELECTROSURGICAL) ×3
ELECTRODE REM PT RTRN 9FT ADLT (ELECTROSURGICAL) ×2 IMPLANT
GAUZE SPONGE 4X4 12PLY STRL (GAUZE/BANDAGES/DRESSINGS) ×3 IMPLANT
GAUZE SPONGE 4X4 16PLY XRAY LF (GAUZE/BANDAGES/DRESSINGS) ×3 IMPLANT
GLOVE BIOGEL PI IND STRL 7.0 (GLOVE) ×2 IMPLANT
GLOVE BIOGEL PI INDICATOR 7.0 (GLOVE) ×1
GLOVE EUDERMIC 7 POWDERFREE (GLOVE) ×6 IMPLANT
GOWN STRL REUS W/TWL LRG LVL3 (GOWN DISPOSABLE) ×3 IMPLANT
GOWN STRL REUS W/TWL XL LVL3 (GOWN DISPOSABLE) ×6 IMPLANT
KIT BASIN OR (CUSTOM PROCEDURE TRAY) ×3 IMPLANT
KIT PORT POWER 8FR ISP CVUE (Catheter) ×1 IMPLANT
LIQUID BAND (GAUZE/BANDAGES/DRESSINGS) ×2 IMPLANT
MARKER SKIN DUAL TIP RULER LAB (MISCELLANEOUS) ×3 IMPLANT
NDL HYPO 25X1 1.5 SAFETY (NEEDLE) ×2 IMPLANT
NEEDLE HYPO 22GX1.5 SAFETY (NEEDLE) ×3 IMPLANT
NEEDLE HYPO 25X1 1.5 SAFETY (NEEDLE) ×3 IMPLANT
NS IRRIG 1000ML POUR BTL (IV SOLUTION) ×3 IMPLANT
PACK BASIC VI WITH GOWN DISP (CUSTOM PROCEDURE TRAY) ×3 IMPLANT
PENCIL BUTTON HOLSTER BLD 10FT (ELECTRODE) ×3 IMPLANT
SPONGE LAP 18X18 X RAY DECT (DISPOSABLE) ×3 IMPLANT
STRIP CLOSURE SKIN 1/2X4 (GAUZE/BANDAGES/DRESSINGS) ×3 IMPLANT
SUT MNCRL AB 4-0 PS2 18 (SUTURE) ×3 IMPLANT
SUT PROLENE 2 0 SH DA (SUTURE) ×3 IMPLANT
SUT VIC AB 3-0 SH 18 (SUTURE) ×3 IMPLANT
SYR BULB IRRIGATION 50ML (SYRINGE) ×3 IMPLANT
SYR CONTROL 10ML LL (SYRINGE) ×3 IMPLANT
SYRINGE 10CC LL (SYRINGE) ×3 IMPLANT
TAG SUTURE CLAMP YLW 5PR (MISCELLANEOUS) ×2
TOWEL OR 17X26 10 PK STRL BLUE (TOWEL DISPOSABLE) ×3 IMPLANT
YANKAUER SUCT BULB TIP 10FT TU (MISCELLANEOUS) ×3 IMPLANT

## 2014-11-19 NOTE — Discharge Instructions (Signed)
Keep right axillary wound clean and dry for 2 days, then you may take a brief shower.  Call if you develop any wound swelling, severe pain, redness or infection        PORT-A-CATH: POST OP INSTRUCTIONS  Always review your discharge instruction sheet given to you by the facility where your surgery was performed.   1. A prescription for pain medication may be given to you upon discharge. Take your pain medication as prescribed, if needed. If narcotic pain medicine is not needed, then you make take acetaminophen (Tylenol) or ibuprofen (Advil) as needed.  2. Take your usually prescribed medications unless otherwise directed. 3. If you need a refill on your pain medication, please contact our office. All narcotic pain medicine now requires a paper prescription.  Phoned in and fax refills are no longer allowed by law.  Prescriptions will not be filled after 5 pm or on weekends.  4. You should follow a light diet for the remainder of the day after your procedure. 5. Most patients will experience some mild swelling and/or bruising in the area of the incision. It may take several days to resolve. 6. It is common to experience some constipation if taking pain medication after surgery. Increasing fluid intake and taking a stool softener (such as Colace) will usually help or prevent this problem from occurring. A mild laxative (Milk of Magnesia or Miralax) should be taken according to package directions if there are no bowel movements after 48 hours.  7. Unless discharge instructions indicate otherwise, you may remove your bandages 48 hours after surgery, and you may shower at that time. You may have steri-strips (small white skin tapes) in place directly over the incision.  These strips should be left on the skin for 7-10 days.  If your surgeon used Dermabond (skin glue) on the incision, you may shower in 24 hours.  The glue will flake off over the next 2-3 weeks.  8. If your port is left accessed at the  end of surgery (needle left in port), the dressing cannot get wet and should only by changed by a healthcare professional. When the port is no longer accessed (when the needle has been removed), follow step 7.   9. ACTIVITIES:  Limit activity involving your arms for the next 72 hours. Do no strenuous exercise or activity for 1 week. You may drive when you are no longer taking prescription pain medication, you can comfortably wear a seatbelt, and you can maneuver your car. 10.You may need to see your doctor in the office for a follow-up appointment.  Please       check with your doctor.  11.When you receive a new Port-a-Cath, you will get a product guide and        ID card.  Please keep them in case you need them.  WHEN TO CALL YOUR DOCTOR [phone]): 1. Fever over 101.0 2. Chills 3. Continued bleeding from incision 4. Increased redness and tenderness at the site 5. Shortness of breath, difficulty breathing   The clinic staff is available to answer your questions during regular business hours. Please dont hesitate to call and ask to speak to one of the nurses or medical assistants for clinical concerns. If you have a medical emergency, go to the nearest emergency room or call 911.  A surgeon from [facility] is always on call at the hospital.     For further information, please visit [email or web address]

## 2014-11-19 NOTE — Anesthesia Preprocedure Evaluation (Signed)
Anesthesia Evaluation  Patient identified by MRN, date of birth, ID band Patient awake    Reviewed: Allergy & Precautions, NPO status , Patient's Chart, lab work & pertinent test results  Airway Mallampati: II  TM Distance: >3 FB Neck ROM: Full    Dental no notable dental hx.    Pulmonary shortness of breath, pneumonia -, former smoker,  breath sounds clear to auscultation  Pulmonary exam normal       Cardiovascular hypertension, Pt. on medications Normal cardiovascular examRhythm:Regular Rate:Normal     Neuro/Psych negative neurological ROS  negative psych ROS   GI/Hepatic negative GI ROS, Neg liver ROS,   Endo/Other  negative endocrine ROSdiabetes  Renal/GU Renal disease  negative genitourinary   Musculoskeletal negative musculoskeletal ROS (+)   Abdominal   Peds negative pediatric ROS (+)  Hematology  (+) anemia ,   Anesthesia Other Findings   Reproductive/Obstetrics negative OB ROS                             Anesthesia Physical Anesthesia Plan  ASA: III  Anesthesia Plan: General   Post-op Pain Management:    Induction: Intravenous  Airway Management Planned: LMA  Additional Equipment:   Intra-op Plan:   Post-operative Plan: Extubation in OR  Informed Consent: I have reviewed the patients History and Physical, chart, labs and discussed the procedure including the risks, benefits and alternatives for the proposed anesthesia with the patient or authorized representative who has indicated his/her understanding and acceptance.   Dental advisory given  Plan Discussed with: CRNA  Anesthesia Plan Comments:         Anesthesia Quick Evaluation

## 2014-11-19 NOTE — Op Note (Signed)
Patient Name:           [name] [name]   Date of Surgery:        11/19/2014  Pre op Diagnosis:      Lymphoma, diffuse disease  Post op Diagnosis:    Same  Procedure:                 Excision deep right axillary lymph node                                      Insertion of 8 French power port ClearVue tunneled venous vascular access device                                      Use of fluoroscopy for guidance and positioning  Surgeon:                     Edsel Petrin. Dalbert Batman, M.D., FACS  Assistant:                      Or staff  Operative Indications:   The patient is a 64 year old male who presents with a complaint of diffuse lymphadenopathy. This is a 64 year old African-American man who is here with his wife. He was referred by Dr. Alvy Bimler for excisional lymph node biopsy. Lymphoma is suspected. About 4 weeks ago he thought he had the flu. Cough and sweats. 5 pound weight loss. He noted swelling in his neck, under his arms, and his groins. Initially evaluated at urgent medical care. Sedimentation rate and d-dimer elevated. Troponin normal. Creatinine 2.3. WBC 11.9. Hemoglobin 9.8. Platelet count 201,000. Ventilation perfusion scan was negative. CT scan shows enlarged lymph nodes and chest and upper abdomen and the spleen is prominent.  PET CT shows diffuse hypermetabolic uptake in neck, axilla, chest, abdomen, inguinal areas. He has seen Dr. Alvy Bimler requested axillary lymph node biopsy and Port-A-Cath insertion. Comorbidities include former tobacco use, quit 2010. Former alcohol abuse, quit 1999. Hyperlipidemia. CKD stage III. 2 diabetes with renal complication. Hypertension. Anemia of chronic disease. Left forearm surgery for chainsaw accident.  He is left-handed. We plan a right axillary lymph node biopsy   Operative Findings:       The right axillary lymph nodes were pathologically enlarged, 2.5 cm or greater. I took two adjacent lymph nodes out and send them  fresh to the lab for lymphoma workup. I inserted the port through the right subclavian vein and it appeared well-positioned under fluoroscopy in the OR. A chest x-ray is pending.            Procedure in Detail:           Following the induction of general endotracheal anesthesia a surgical timeout was performed. Intravenous antibiotics were given. Right axilla was prepped and draped in a sterile fashion. 0.5% Marcaine with epinephrine was used as a local infiltration anesthetic. A transverse incision was made in the right axilla at the hairline. Dissection was carried down through the clavipectoral fascia and the axillary space was entered. I dissected out two large lymph nodes. Lymphatics and vascular tributaries were controlled with metal clips and  electrocautery. The specimen was sent fresh to the lab with the appropriate history attached. The axillary wound was irrigated with saline. Hemostasis was excellent. The  deeper tissues were closed with interrupted sutures of 3-0 Vicryl and the skin closed with a running subcuticular 4-0 Monocryl and Dermabond.     We then changed the gowns and gloves, and reprepped and redraped the patient The patient was repositioned with a small roll behind his shoulders and his arms tucked at his side.  Another surgical timeout was performed. A right subclavian venipuncture was performed and a guidewire inserted into the Vena cava under fluoroscopic guidance. A small incision was made at the wire insertion site. Transverse incision was made about 2-1/2 cm below the midpoint of the right clavicle and a subcutaneous pocket was created at the level of the pectoralis fascia. Using a tunneling device I passed the catheter from the wire insertion site to the port pocket site. Using fluoroscopy I drew a template on the chest wall to help position the catheter so the tip would be in the superior vena cava near the right atrial junction. I then measured the catheter according to the  template and cut it 35.7 cm in length. The catheter was secured to the port with the locking device and flushed with heparinized saline. The port was placed in the pocket. I passed the dilator and the peel-away sheath assembly over the guidewire. This was a little bit difficult and I had to use of fluoroscopy to guide this into the  Vena cava. The wire and dilator were removed and threaded the catheter into the Vena cava,   ultimately being well-positioned in the vena cava near the right atrium. The port and catheter flushed easily and had excellent blood return. I flushed the port and catheter with concentrated heparin. I sutured the port to the pectoralis fascia with 3 interrupted sutures of 2-0 Prolene. Hemostasis was excellent. The subcutaneous tissue was closed with 3-0 Vicryl sutures and the skin incisions were closed with subcuticular 4-0 Monocryl and Dermabond.     The patient tolerated both procedures well and was taken to PACU in stable condition. EBL 20 mL. Counts correct. Complications none. A portable chest x-ray is planned.          Edsel Petrin. Dalbert Batman, M.D., FACS General and Minimally Invasive Surgery Breast and Colorectal Surgery  11/19/2014 3:15 PM

## 2014-11-19 NOTE — Transfer of Care (Signed)
Immediate Anesthesia Transfer of Care Note  Patient: [name] [name]  Procedure(s) Performed: Procedure(s): EXCISIONAL BIOPSY DEEP RIGHT AXILLARY LYMPH NODE  (Right) INSERTION PORT-A-CATH (N/A)  Patient Location: PACU  Anesthesia Type:General  Level of Consciousness: awake, alert , oriented and patient cooperative  Airway & Oxygen Therapy: Patient Spontanous Breathing and Patient connected to face mask oxygen  Post-op Assessment: Report given to RN, Post -op Vital signs reviewed and stable and Patient moving all extremities  Post vital signs: Reviewed and stable  Last Vitals:  Filed Vitals:   11/19/14 1036  BP:   Pulse: 104  Temp:   Resp:     Complications: No apparent anesthesia complications

## 2014-11-19 NOTE — Anesthesia Procedure Notes (Signed)
Procedure Name: Intubation Performed by: Chyrel Masson Pre-anesthesia Checklist: Patient identified, Emergency Drugs available, Suction available, Patient being monitored and Timeout performed Preoxygenation: Pre-oxygenation with 100% oxygen Intubation Type: IV induction Ventilation: Mask ventilation without difficulty Laryngoscope Size: Mac and 3 Tube type: Oral Tube size: 7.5 mm Number of attempts: 1 Airway Equipment and Method: Stylet Placement Confirmation: ETT inserted through vocal cords under direct vision,  positive ETCO2 and breath sounds checked- equal and bilateral Secured at: 22 cm Tube secured with: Tape Dental Injury: Teeth and Oropharynx as per pre-operative assessment  Difficulty Due To: Difficulty was unanticipated

## 2014-11-19 NOTE — Progress Notes (Signed)
Axilla wound class II Re-prepped and draped port a cath

## 2014-11-19 NOTE — Anesthesia Postprocedure Evaluation (Signed)
  Anesthesia Post-op Note  Patient: [name] [name]  Procedure(s) Performed: Procedure(s) (LRB): EXCISIONAL BIOPSY DEEP RIGHT AXILLARY LYMPH NODE  (Right) INSERTION PORT-A-CATH (N/A)  Patient Location: PACU  Anesthesia Type: General  Level of Consciousness: awake and alert   Airway and Oxygen Therapy: Patient Spontanous Breathing  Post-op Pain: mild  Post-op Assessment: Post-op Vital signs reviewed, Patient's Cardiovascular Status Stable, Respiratory Function Stable, Patent Airway and No signs of Nausea or vomiting  Last Vitals:  Filed Vitals:   11/19/14 1725  BP: 123/50  Pulse: 100  Temp: 36.8 C  Resp: 16    Post-op Vital Signs: stable   Complications: No apparent anesthesia complications

## 2014-11-19 NOTE — Interval H&P Note (Signed)
History and Physical Interval Note:  11/19/2014 1:20 PM  [name] [name]  has presented today for surgery, with the diagnosis of lymphadenopothy  The various methods of treatment have been discussed with the patient and family. After consideration of risks, benefits and other options for treatment, the patient has consented to  Procedure(s): EXCISIONAL BIOPSY DEEP RIGHT AXILLARY LYMPH NODE  (Right) INSERTION PORT-A-CATH (N/A) as a surgical intervention .  The patient's history has been reviewed, patient examined today, no change in status, stable for surgery.  I have reviewed the patient's chart and labs.  Questions were answered to the patient's satisfaction.     [name]

## 2014-11-20 ENCOUNTER — Encounter (HOSPITAL_COMMUNITY): Payer: Self-pay | Admitting: General Surgery

## 2014-11-21 ENCOUNTER — Other Ambulatory Visit: Payer: Self-pay | Admitting: Hematology and Oncology

## 2014-11-21 ENCOUNTER — Telehealth: Payer: Self-pay | Admitting: Hematology and Oncology

## 2014-11-21 ENCOUNTER — Ambulatory Visit (HOSPITAL_BASED_OUTPATIENT_CLINIC_OR_DEPARTMENT_OTHER): Payer: Medicare Other | Admitting: Hematology and Oncology

## 2014-11-21 ENCOUNTER — Ambulatory Visit (HOSPITAL_BASED_OUTPATIENT_CLINIC_OR_DEPARTMENT_OTHER): Payer: Medicare Other

## 2014-11-21 VITALS — BP 118/40 | HR 124 | Temp 98.4°F | Resp 18 | Ht 70.5 in | Wt 159.3 lb

## 2014-11-21 DIAGNOSIS — D630 Anemia in neoplastic disease: Secondary | ICD-10-CM

## 2014-11-21 DIAGNOSIS — N183 Chronic kidney disease, stage 3 unspecified: Secondary | ICD-10-CM

## 2014-11-21 DIAGNOSIS — C8590 Non-Hodgkin lymphoma, unspecified, unspecified site: Secondary | ICD-10-CM | POA: Diagnosis not present

## 2014-11-21 LAB — COMPREHENSIVE METABOLIC PANEL (CC13)
ALT: 36 U/L (ref 0–55)
ANION GAP: 12 meq/L — AB (ref 3–11)
AST: 43 U/L — ABNORMAL HIGH (ref 5–34)
Albumin: 2.5 g/dL — ABNORMAL LOW (ref 3.5–5.0)
Alkaline Phosphatase: 68 U/L (ref 40–150)
BILIRUBIN TOTAL: 0.85 mg/dL (ref 0.20–1.20)
BUN: 68.5 mg/dL — ABNORMAL HIGH (ref 7.0–26.0)
CALCIUM: 7.9 mg/dL — AB (ref 8.4–10.4)
CO2: 19 meq/L — AB (ref 22–29)
Chloride: 100 mEq/L (ref 98–109)
Creatinine: 2.9 mg/dL — ABNORMAL HIGH (ref 0.7–1.3)
EGFR: 25 mL/min/{1.73_m2} — ABNORMAL LOW (ref >90)
GLUCOSE: 207 mg/dL — AB (ref 70–140)
Potassium: 4.9 mEq/L (ref 3.5–5.1)
Sodium: 132 mEq/L — ABNORMAL LOW (ref 136–145)
TOTAL PROTEIN: 5.9 g/dL — AB (ref 6.4–8.3)

## 2014-11-21 LAB — CBC WITH DIFFERENTIAL/PLATELET
BASO%: 0.2 % (ref 0.0–2.0)
Basophils Absolute: 0 10*3/uL (ref 0.0–0.1)
EOS ABS: 0 10*3/uL (ref 0.0–0.5)
EOS%: 0.3 % (ref 0.0–7.0)
HCT: 25.1 % — ABNORMAL LOW (ref 38.4–49.9)
HEMOGLOBIN: 8.2 g/dL — AB (ref 13.0–17.1)
LYMPH%: 24.5 % (ref 14.0–49.0)
MCH: 26.1 pg — ABNORMAL LOW (ref 27.2–33.4)
MCHC: 32.7 g/dL (ref 32.0–36.0)
MCV: 79.9 fL (ref 79.3–98.0)
MONO#: 2.4 10*3/uL — AB (ref 0.1–0.9)
MONO%: 18.3 % — AB (ref 0.0–14.0)
NEUT%: 56.7 % (ref 39.0–75.0)
NEUTROS ABS: 7.5 10*3/uL — AB (ref 1.5–6.5)
Platelets: 138 10*3/uL — ABNORMAL LOW (ref 140–400)
RBC: 3.14 10*6/uL — AB (ref 4.20–5.82)
RDW: 17.1 % — AB (ref 11.0–14.6)
WBC: 13.3 10*3/uL — AB (ref 4.0–10.3)
lymph#: 3.2 10*3/uL (ref 0.9–3.3)
nRBC: 0 % (ref 0–0)

## 2014-11-21 LAB — URIC ACID (CC13): Uric Acid, Serum: 12 mg/dl — ABNORMAL HIGH (ref 2.6–7.4)

## 2014-11-21 LAB — TECHNOLOGIST REVIEW

## 2014-11-21 LAB — HEPATITIS B SURFACE ANTIGEN: Hepatitis B Surface Ag: NEGATIVE

## 2014-11-21 LAB — HEPATITIS B CORE ANTIBODY, IGM: Hep B C IgM: NONREACTIVE

## 2014-11-21 LAB — HEPATITIS B SURFACE ANTIBODY,QUALITATIVE: HEP B S AB: POSITIVE — AB

## 2014-11-21 LAB — LACTATE DEHYDROGENASE (CC13): LDH: 948 U/L — AB (ref 125–245)

## 2014-11-21 MED ORDER — LIDOCAINE-PRILOCAINE 2.5-2.5 % EX CREA: TOPICAL_CREAM | CUTANEOUS | Status: DC

## 2014-11-21 MED ORDER — PREDNISONE 20 MG PO TABS: 60.0000 mg | ORAL_TABLET | Freq: Every day | ORAL | Status: DC

## 2014-11-21 MED ORDER — ALLOPURINOL 100 MG PO TABS
100.0000 mg | ORAL_TABLET | Freq: Every day | ORAL | Status: DC
Start: 2014-11-21 — End: 2015-01-02

## 2014-11-21 MED ORDER — PROCHLORPERAZINE MALEATE 10 MG PO TABS: 10.0000 mg | ORAL_TABLET | Freq: Four times a day (QID) | ORAL | Status: DC | PRN

## 2014-11-21 MED ORDER — ONDANSETRON HCL 8 MG PO TABS: 8.0000 mg | ORAL_TABLET | Freq: Two times a day (BID) | ORAL | Status: DC

## 2014-11-21 NOTE — Progress Notes (Signed)
Repeat blood work is reviewed. He has significant elevated uric acid and LDH. I recommend IV fluid hydration before and after the chemotherapy and to give him 1 dose of Rasburicase on the day before chemotherapy.

## 2014-11-21 NOTE — Telephone Encounter (Signed)
Gave adn pirnted appt sched and avs for pt for May

## 2014-11-21 NOTE — Assessment & Plan Note (Signed)
I suspect the cause of anemia I will likely combination of anemia of chronic disease and possible bone marrow infiltration by lymphoma. I present time, he is not symptomatic. He can be observed until further workup.

## 2014-11-21 NOTE — Assessment & Plan Note (Signed)
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 The patient would benefit from aggressive treatment such as RCHOP chemotherapy. I will order echocardiogram team and education class and plan to start him next week for first dose of treatment. I will enlist the help of pharmacist to calculate dose adjustment based on his blood work. Due to high-grade disease and significant disease burden, I suspect he will have significant risk of infusion reaction and tumor lysis syndrome. I will start him on 60 mg prednisone starting tomorrow and allopurinol. I recommend the patient to hold off his diuretic and blood pressure medication to reduce the risk of kidney injury. I plan to see him the following week after the start of chemotherapy for repeat blood work, toxicity review and Neulasta injection. 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 We discussed the role of chemotherapy. The intent is for cure.  We discussed some of the risks, benefits and side-effects of Rituximab,Cytoxan, Adriamycin,Vincristine and Solumedrol/Prednisone.   Some of the short term side-effects included, though not limited to, risk of fatigue, weight loss, tumor lysis syndrome, risk of allergic reactions, pancytopenia, life-threatening infections, need for transfusions of blood products, nausea, vomiting, change in bowel habits, hair loss, risk of congestive heart failure, admission to hospital for various reasons, and risks of death.   Long term side-effects are also discussed including permanent damage to nerve function, chronic fatigue, and rare secondary malignancy including bone marrow disorders.   The patient is aware that the response rates discussed earlier is not guaranteed.    After a long discussion, patient made an informed decision to proceed with the prescribed plan of care

## 2014-11-21 NOTE — Progress Notes (Signed)
[place] OFFICE PROGRESS NOTE  Patient Care Team: Harrison Mons, PA-C as PCP - General (Physician Assistant) Corliss Parish, MD as Consulting Physician (Nephrology)  SUMMARY OF ONCOLOGIC HISTORY: Oncology History   Lymphoma   Staging form: Lymphoid Neoplasms, AJCC 6th Edition     Clinical stage from 11/21/2014: Stage IV - Signed by Heath Lark, MD on 11/21/2014       Lymphoma   11/04/2014 Initial Diagnosis Lymphoma   11/13/2014 Imaging PEt scan showed widespread disease including possible splenic involvement   11/19/2014 Surgery He has excisional LN biopsy and port placement   11/19/2014 Pathology 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 He returns for further follow-up. He is recovering well from recent surgery.  REVIEW OF SYSTEMS:   Constitutional: Denies fevers, chills or abnormal weight loss Eyes: Denies blurriness of vision Ears, nose, mouth, throat, and face: Denies mucositis or sore throat Respiratory: Denies cough, dyspnea or wheezes Cardiovascular: Denies palpitation, chest discomfort or lower extremity swelling Gastrointestinal:  Denies nausea, heartburn or change in bowel habits Skin: Denies abnormal skin rashes Lymphatics: Denies new lymphadenopathy or easy bruising Neurological:Denies numbness, tingling or new weaknesses Behavioral/Psych: Mood is stable, no new changes  All other systems were reviewed with the patient and are negative.  I have reviewed the past medical history, past surgical history, social history and family history with the patient and they are unchanged from previous note.  ALLERGIES:  is allergic to ace inhibitors.  MEDICATIONS:  Current Outpatient Prescriptions  Medication Sig Dispense Refill  . amLODipine (NORVASC) 10 MG tablet TAKE ONE TABLET BY MOUTH ONCE DAILY 30 tablet 5  . aspirin 81 MG tablet Take 81 mg by mouth daily.    .  fish oil-omega-3 fatty acids 1000 MG capsule Take 1 g by mouth daily.    Marland Kitchen glipiZIDE (GLUCOTROL XL) 10 MG 24 hr tablet TAKE ONE TABLET BY MOUTH ONCE DAILY 30 tablet 5  . glucose blood (ACCU-CHEK AVIVA PLUS) test strip Use as instructed 100 each 4  . hydrochlorothiazide (HYDRODIURIL) 25 MG tablet TAKE ONE TABLET BY MOUTH ONCE DAILY 30 tablet 5  . HYDROcodone-acetaminophen (NORCO) 5-325 MG per tablet Take 1-2 tablets by mouth every 6 (six) hours as needed. 30 tablet 0  . losartan (COZAAR) 100 MG tablet Take 1 tablet (100 mg total) by mouth daily. 30 tablet 5  . pravastatin (PRAVACHOL) 40 MG tablet TAKE ONE TABLET BY MOUTH ONCE DAILY 30 tablet 5  . sitaGLIPtin (JANUVIA) 50 MG tablet Take 1 tablet (50 mg total) by mouth daily. 30 tablet 5  . allopurinol (ZYLOPRIM) 100 MG tablet Take 1 tablet (100 mg total) by mouth daily. 30 tablet 0  . lidocaine-prilocaine (EMLA) cream Apply to affected area once 30 g 3  . ondansetron (ZOFRAN) 8 MG tablet Take 1 tablet (8 mg total) by mouth 2 (two) times daily. Start the day after chemo for 3 days. Then as needed for nausea or vomiting. 30 tablet 1  . predniSONE (DELTASONE) 20 MG tablet Take 3 tablets (60 mg total) by mouth daily. Take on days 1-5 of chemotherapy. 60 tablet 0  . prochlorperazine (COMPAZINE) 10 MG tablet Take 1 tablet (10 mg total) by mouth every 6 (six) hours as needed (Nausea or vomiting). 30 tablet 6   No current facility-administered medications for this visit.    PHYSICAL EXAMINATION: ECOG PERFORMANCE STATUS: 0 - Asymptomatic  Filed Vitals:   11/21/14  1346  BP: 118/40  Pulse: 124  Temp: 98.4 F (36.9 C)  Resp: 18   Filed Weights   11/21/14 1346  Weight: 159 lb 4.8 oz (72.258 kg)    GENERAL:alert, no distress and comfortable SKIN: skin color, texture, turgor are normal, no rashes or significant lesions EYES: normal, Conjunctiva are pink and non-injected, sclera clear Musculoskeletal:no cyanosis of digits and no clubbing  NEURO:  alert & oriented x 3 with fluent speech, no focal motor/sensory deficits  LABORATORY DATA:  I have reviewed the data as listed    Component Value Date/Time   NA 134* 11/14/2014 0910   NA 139 07/17/2014   K 4.8 11/14/2014 0910   CL 102 11/14/2014 0910   CO2 22 11/14/2014 0910   GLUCOSE 159* 11/14/2014 0910   BUN 53* 11/14/2014 0910   BUN 26* 07/17/2014   CREATININE 2.70* 11/14/2014 0910   CREATININE 2.30* 10/31/2014 1114   CREATININE 2.1* 07/17/2014   CALCIUM 8.7* 11/14/2014 0910   PROT 7.7 09/24/2014 1610   ALBUMIN 4.5 09/24/2014 1610   AST 25 09/24/2014 1610   ALT 30 09/24/2014 1610   ALKPHOS 68 09/24/2014 1610   BILITOT 0.7 09/24/2014 1610   GFRNONAA 23* 11/14/2014 0910   GFRNONAA 33* 09/11/2013 0920   GFRAA 27* 11/14/2014 0910   GFRAA 38* 09/11/2013 0920    No results found for: SPEP, UPEP  Lab Results  Component Value Date   WBC 12.2* 11/14/2014   HGB 8.8* 11/14/2014   HCT 28.1* 11/14/2014   MCV 81.2 11/14/2014   PLT 123* 11/14/2014      Chemistry      Component Value Date/Time   NA 134* 11/14/2014 0910   NA 139 07/17/2014   K 4.8 11/14/2014 0910   CL 102 11/14/2014 0910   CO2 22 11/14/2014 0910   BUN 53* 11/14/2014 0910   BUN 26* 07/17/2014   CREATININE 2.70* 11/14/2014 0910   CREATININE 2.30* 10/31/2014 1114   CREATININE 2.1* 07/17/2014   GLU 140 07/17/2014      Component Value Date/Time   CALCIUM 8.7* 11/14/2014 0910   ALKPHOS 68 09/24/2014 1610   AST 25 09/24/2014 1610   ALT 30 09/24/2014 1610   BILITOT 0.7 09/24/2014 1610     I have reviewed the PET CT scan with the patient and his daughter  RADIOGRAPHIC STUDIES: I have personally reviewed the radiological images as listed and agreed with the findings in the report. Dg Chest Port 1 View  11/19/2014   CLINICAL DATA:  Right-sided Port-A-Cath placement  EXAM: PORTABLE CHEST - 1 VIEW  COMPARISON:  11/14/2014  FINDINGS: There is a right-sided Port-A-Cath with the tip projecting over the SVC.  There are low lung volumes with right basilar atelectasis. There is no other focal parenchymal opacity. There is no pleural effusion or pneumothorax. The heart and mediastinal contours are unremarkable.  The osseous structures are unremarkable.  IMPRESSION: Right-sided Port-A-Cath with the tip projecting over the SVC. No pneumothorax.   Electronically Signed   By: Kathreen Devoid   On: 11/19/2014 15:59   Dg C-arm 1-60 Min-no Report  11/19/2014   CLINICAL DATA: surgery   C-ARM 1-60 MINUTES  Fluoroscopy was utilized by the requesting physician.  No radiographic  interpretation.      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 The patient would benefit from aggressive treatment such as RCHOP chemotherapy. I will order echocardiogram team and education class  and plan to start him next week for first dose of treatment. I will enlist the help of pharmacist to calculate dose adjustment based on his blood work. Due to high-grade disease and significant disease burden, I suspect he will have significant risk of infusion reaction and tumor lysis syndrome. I will start him on 60 mg prednisone starting tomorrow and allopurinol. I recommend the patient to hold off his diuretic and blood pressure medication to reduce the risk of kidney injury. I plan to see him the following week after the start of chemotherapy for repeat blood work, toxicity review and Neulasta injection. 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 We discussed the role of chemotherapy. The intent is for cure.  We discussed some of the risks, benefits and side-effects of Rituximab,Cytoxan, Adriamycin,Vincristine and Solumedrol/Prednisone.   Some of the short term side-effects included, though not limited to, risk of fatigue, weight loss, tumor lysis syndrome, risk of allergic  reactions, pancytopenia, life-threatening infections, need for transfusions of blood products, nausea, vomiting, change in bowel habits, hair loss, risk of congestive heart failure, admission to hospital for various reasons, and risks of death.   Long term side-effects are also discussed including permanent damage to nerve function, chronic fatigue, and rare secondary malignancy including bone marrow disorders.   The patient is aware that the response rates discussed earlier is not guaranteed.    After a long discussion, patient made an informed decision to proceed with the prescribed plan of care      Anemia in neoplastic disease I suspect the cause of anemia I will likely combination of anemia of chronic disease and possible bone marrow infiltration by lymphoma. I present time, he is not symptomatic. He can be observed until further workup.     Chronic kidney disease, stage III (moderate) He has chronic kidney disease and this is unrelated to the lymphoma. He will need dose adjustment for his treatment.    Orders Placed This Encounter  Procedures  . Comprehensive metabolic panel    Standing Status: Future     Number of Occurrences: 1     Standing Expiration Date: 12/26/2015  . CBC with Differential/Platelet    Standing Status: Future     Number of Occurrences: 1     Standing Expiration Date: 12/26/2015  . Lactate dehydrogenase    Standing Status: Future     Number of Occurrences: 1     Standing Expiration Date: 12/26/2015  . Uric Acid    Standing Status: Future     Number of Occurrences: 1     Standing Expiration Date: 12/26/2015  . Hepatitis B core antibody, IgM    Standing Status: Future     Number of Occurrences: 1     Standing Expiration Date: 12/26/2015  . Hepatitis B surface antibody    Standing Status: Future     Number of Occurrences: 1     Standing Expiration Date: 12/26/2015  . Hepatitis B surface antigen    Standing Status: Future     Number of Occurrences: 1      Standing Expiration Date: 12/26/2015  . CBC with Differential    Standing Status: Standing     Number of Occurrences: 20     Standing Expiration Date: 11/22/2015  . Comprehensive metabolic panel    Standing Status: Standing     Number of Occurrences: 20     Standing Expiration Date: 11/22/2015  .  Uric Acid    Standing Status: Future     Number of Occurrences:      Standing Expiration Date: 12/26/2015  . Lactate dehydrogenase    Standing Status: Future     Number of Occurrences:      Standing Expiration Date: 12/26/2015  . PHYSICIAN COMMUNICATION ORDER    A baseline Echo/ Muga should be obtained prior to initiation of Anthracycline Chemotherapy  . PHYSICIAN COMMUNICATION ORDER    Hepatitis B Virus screening with HBsAg and anti-HBc recommended prior to treatment with rituximab (Rituxan), ofatumumab (Arzerra) or obinutuzumab Dyann Kief).  . ECHOCARDIOGRAM COMPLETE    Standing Status: Future     Number of Occurrences:      Standing Expiration Date: 02/20/2016    Order Specific Question:  Where should this test be performed    Answer:  [facility]    Order Specific Question:  Complete or Limited study?    Answer:  Limited    Order Specific Question:  With Image Enhancing Agent or without Image Enhancing Agent?    Answer:  Without Image Enhancing Agent    Order Specific Question:  Contraindication for Image Enhancing Agent?    Answer:  Per Appointment Conversion    Order Specific Question:  Reason for exam-Echo    Answer:  Chemo  V67.2 / Z09  . Hold Tube, Blood Bank    Standing Status: Future     Number of Occurrences:      Standing Expiration Date: 12/26/2015   All questions were answered. The patient knows to call the clinic with any problems, questions or concerns. No barriers to learning was detected. I spent 30 minutes counseling the patient face to face. The total time spent in the appointment was 40 minutes and more than 50% was on counseling and review of test results      [facility], [place], MD 11/21/2014 2:47 PM

## 2014-11-21 NOTE — Assessment & Plan Note (Signed)
He has chronic kidney disease and this is unrelated to the lymphoma. He will need dose adjustment for his treatment.

## 2014-11-22 ENCOUNTER — Other Ambulatory Visit: Payer: Self-pay | Admitting: *Deleted

## 2014-11-22 ENCOUNTER — Telehealth: Payer: Self-pay | Admitting: *Deleted

## 2014-11-22 ENCOUNTER — Encounter (HOSPITAL_COMMUNITY): Payer: Self-pay | Admitting: *Deleted

## 2014-11-22 ENCOUNTER — Telehealth: Payer: Self-pay | Admitting: Hematology and Oncology

## 2014-11-22 ENCOUNTER — Emergency Department (HOSPITAL_COMMUNITY)
Admission: EM | Admit: 2014-11-22 | Discharge: 2014-11-23 | Disposition: A | Discharge status: Home / Self Care | Payer: Medicare Other | Attending: Emergency Medicine | Admitting: Emergency Medicine

## 2014-11-22 DIAGNOSIS — Z7982 Long term (current) use of aspirin: Secondary | ICD-10-CM | POA: Insufficient documentation

## 2014-11-22 DIAGNOSIS — T380X5A Adverse effect of glucocorticoids and synthetic analogues, initial encounter: Secondary | ICD-10-CM | POA: Insufficient documentation

## 2014-11-22 DIAGNOSIS — E1165 Type 2 diabetes mellitus with hyperglycemia: Secondary | ICD-10-CM | POA: Diagnosis not present

## 2014-11-22 DIAGNOSIS — Z8701 Personal history of pneumonia (recurrent): Secondary | ICD-10-CM | POA: Diagnosis not present

## 2014-11-22 DIAGNOSIS — Z7952 Long term (current) use of systemic steroids: Secondary | ICD-10-CM | POA: Insufficient documentation

## 2014-11-22 DIAGNOSIS — Z79899 Other long term (current) drug therapy: Secondary | ICD-10-CM | POA: Insufficient documentation

## 2014-11-22 DIAGNOSIS — Z87438 Personal history of other diseases of male genital organs: Secondary | ICD-10-CM | POA: Insufficient documentation

## 2014-11-22 DIAGNOSIS — I129 Hypertensive chronic kidney disease with stage 1 through stage 4 chronic kidney disease, or unspecified chronic kidney disease: Secondary | ICD-10-CM | POA: Diagnosis not present

## 2014-11-22 DIAGNOSIS — N183 Chronic kidney disease, stage 3 (moderate): Secondary | ICD-10-CM | POA: Insufficient documentation

## 2014-11-22 DIAGNOSIS — Z862 Personal history of diseases of the blood and blood-forming organs and certain disorders involving the immune mechanism: Secondary | ICD-10-CM | POA: Diagnosis not present

## 2014-11-22 DIAGNOSIS — R531 Weakness: Secondary | ICD-10-CM | POA: Diagnosis present

## 2014-11-22 DIAGNOSIS — E1129 Type 2 diabetes mellitus with other diabetic kidney complication: Secondary | ICD-10-CM | POA: Insufficient documentation

## 2014-11-22 DIAGNOSIS — T50905A Adverse effect of unspecified drugs, medicaments and biological substances, initial encounter: Secondary | ICD-10-CM

## 2014-11-22 DIAGNOSIS — R0789 Other chest pain: Secondary | ICD-10-CM | POA: Insufficient documentation

## 2014-11-22 DIAGNOSIS — Z87891 Personal history of nicotine dependence: Secondary | ICD-10-CM | POA: Diagnosis not present

## 2014-11-22 DIAGNOSIS — R739 Hyperglycemia, unspecified: Secondary | ICD-10-CM

## 2014-11-22 NOTE — ED Notes (Signed)
Pt in via EMS c/o generalized weakness that started earlier today after first dose of prednisone, pt recently dx with cancer in his lymph nodes and had port placed a week ago. Pt denies any redness or swelling at insertion site. Denies pain. CBG 456 for EMS.

## 2014-11-22 NOTE — Telephone Encounter (Signed)
Spoke with patient and he is aware of his appointments,a calendar will be mailed as well  [name] [name]

## 2014-11-22 NOTE — ED Notes (Signed)
CBG=364

## 2014-11-22 NOTE — Telephone Encounter (Signed)
Informed wife of all new schedule changes including Chemo education class,  Echocardiogram,  IVFs, and Chemo.  She verbalized understanding of updated schedule.

## 2014-11-22 NOTE — ED Notes (Signed)
Bed: WA07 Expected date:  Expected time:  Means of arrival:  Comments: EMS 64 yo male generalized malaise/fatigue, generalized weakness, cancer patient, elevated blood sugar

## 2014-11-23 LAB — COMPREHENSIVE METABOLIC PANEL
ALT: 45 U/L (ref 17–63)
AST: 30 U/L (ref 15–41)
Albumin: 2.8 g/dL — ABNORMAL LOW (ref 3.5–5.0)
Alkaline Phosphatase: 85 U/L (ref 38–126)
Anion gap: 12 (ref 5–15)
BUN: 76 mg/dL — AB (ref 6–20)
CO2: 19 mmol/L — ABNORMAL LOW (ref 22–32)
CREATININE: 2.45 mg/dL — AB (ref 0.61–1.24)
Calcium: 8.4 mg/dL — ABNORMAL LOW (ref 8.9–10.3)
Chloride: 94 mmol/L — ABNORMAL LOW (ref 101–111)
GFR, EST AFRICAN AMERICAN: 30 mL/min — AB (ref >60)
GFR, EST NON AFRICAN AMERICAN: 26 mL/min — AB (ref >60)
GLUCOSE: 437 mg/dL — AB (ref 65–99)
Potassium: 4.6 mmol/L (ref 3.5–5.1)
Sodium: 125 mmol/L — ABNORMAL LOW (ref 135–145)
Total Bilirubin: 0.8 mg/dL (ref 0.3–1.2)
Total Protein: 6.4 g/dL — ABNORMAL LOW (ref 6.5–8.1)

## 2014-11-23 LAB — CBC WITH DIFFERENTIAL/PLATELET
Basophils Absolute: 0 10*3/uL (ref 0.0–0.1)
Basophils Relative: 0 % (ref 0–1)
EOS ABS: 0 10*3/uL (ref 0.0–0.7)
Eosinophils Relative: 0 % (ref 0–5)
HEMATOCRIT: 25.4 % — AB (ref 39.0–52.0)
Hemoglobin: 8.1 g/dL — ABNORMAL LOW (ref 13.0–17.0)
Lymphocytes Relative: 27 % (ref 12–46)
Lymphs Abs: 2.7 10*3/uL (ref 0.7–4.0)
MCH: 25.6 pg — AB (ref 26.0–34.0)
MCHC: 31.9 g/dL (ref 30.0–36.0)
MCV: 80.1 fL (ref 78.0–100.0)
MONOS PCT: 20 % — AB (ref 3–12)
Monocytes Absolute: 2 10*3/uL — ABNORMAL HIGH (ref 0.1–1.0)
NEUTROS PCT: 53 % (ref 43–77)
Neutro Abs: 5.2 10*3/uL (ref 1.7–7.7)
Platelets: 107 10*3/uL — ABNORMAL LOW (ref 150–400)
RBC: 3.17 MIL/uL — ABNORMAL LOW (ref 4.22–5.81)
RDW: 16.6 % — ABNORMAL HIGH (ref 11.5–15.5)
WBC: 9.9 10*3/uL (ref 4.0–10.5)

## 2014-11-23 LAB — I-STAT CHEM 8, ED
BUN: 77 mg/dL — AB (ref 6–20)
Calcium, Ion: 1.17 mmol/L (ref 1.13–1.30)
Chloride: 96 mmol/L — ABNORMAL LOW (ref 101–111)
Creatinine, Ser: 2.6 mg/dL — ABNORMAL HIGH (ref 0.61–1.24)
Glucose, Bld: 445 mg/dL — ABNORMAL HIGH (ref 65–99)
HCT: 26 % — ABNORMAL LOW (ref 39.0–52.0)
HEMOGLOBIN: 8.8 g/dL — AB (ref 13.0–17.0)
Potassium: 4.4 mmol/L (ref 3.5–5.1)
Sodium: 127 mmol/L — ABNORMAL LOW (ref 135–145)
TCO2: 16 mmol/L (ref 0–100)

## 2014-11-23 LAB — CBG MONITORING, ED: Glucose-Capillary: 266 mg/dL — ABNORMAL HIGH (ref 65–99)

## 2014-11-23 MED ORDER — HEPARIN SOD (PORK) LOCK FLUSH 100 UNIT/ML IV SOLN
500.0000 [IU] | Freq: Once | INTRAVENOUS | Status: AC
  Administered 2014-11-23: 500 [IU]
  Filled 2014-11-23: qty 5

## 2014-11-23 MED ORDER — INSULIN ASPART 100 UNIT/ML IV SOLN
8.0000 [IU] | Freq: Once | INTRAVENOUS | Status: DC
  Filled 2014-11-23: qty 0.08

## 2014-11-23 MED ORDER — SODIUM CHLORIDE 0.9 % IV BOLUS (SEPSIS)
1000.0000 mL | Freq: Once | INTRAVENOUS | Status: AC
  Administered 2014-11-23: 1000 mL via INTRAVENOUS

## 2014-11-23 NOTE — ED Provider Notes (Signed)
CSN: [id number]     Arrival date & time 11/22/14  2322 History   First MD Initiated Contact with Patient 11/23/14 0001     Chief Complaint  Patient presents with  . Weakness     (Consider location/radiation/quality/duration/timing/severity/associated sxs/prior Treatment) HPI MALEEK [name] is a 64 y.o. male with past medical history of lymphoma presenting today with hyperglycemia. Patient was diagnosed with lymphoma approximately one week ago. He was started on prednisone allopurinol for pretreatment for his upcoming chemotherapy session this Friday. He was advised by his oncologist that this may raise his blood sugars as he has diabetes. His blood sugar was 460 at home. Normally he stays under 140. Patient is not having any symptoms with this. They were told that he may require admission if his blood sugars go too high. This is why he presents emergency department. Patient has no further complaints.  10 Systems reviewed and are negative for acute change except as noted in the HPI.    Past Medical History  Diagnosis Date  . Essential hypertension, benign   . Type II or unspecified type diabetes mellitus with renal manifestations, not stated as uncontrolled   . Chronic kidney disease, stage III (moderate)   . History of tobacco abuse     quit 12/2008  . History of ETOH abuse     quit 1999  . Erectile dysfunction   . Shortness of breath dyspnea     exertion  . Pneumonia   . Anemia   . Cramps, extremity     hx of in legs bilat; currently having issues in toes bilat   . Hyperparathyroidism     secondary / renal per H&P with [facility] 07/17/2014    Past Surgical History  Procedure Laterality Date  . Arm wound repair / closure      chainsaw; left forearm/wrist  . Colonscopy     . Axillary lymph node biopsy Right 11/19/2014    Procedure: EXCISIONAL BIOPSY DEEP RIGHT AXILLARY LYMPH NODE ;  Surgeon: Fanny Skates, MD;  Location: [facility];  Service: General;  Laterality: Right;  .  Portacath placement N/A 11/19/2014    Procedure: INSERTION PORT-A-CATH;  Surgeon: Fanny Skates, MD;  Location: [facility];  Service: General;  Laterality: N/A;   Family History  Problem Relation Age of Onset  . Cancer Father     colon  . Cancer Sister     Breast  . Stroke Brother   . Stroke Brother    History  Substance Use Topics  . Smoking status: Former Smoker -- 1.00 packs/day for 50 years    Types: Cigarettes    Quit date: 07/05/2004  . Smokeless tobacco: Never Used  . Alcohol Use: Yes     Comment: hx of ETOH use; quit drinking approx 2008     Review of Systems    Allergies  Ace inhibitors  Home Medications   Prior to Admission medications   Medication Sig Start Date End Date Taking? Authorizing Provider  allopurinol (ZYLOPRIM) 100 MG tablet Take 1 tablet (100 mg total) by mouth daily. 11/21/14  Yes Heath Lark, MD  amLODipine (NORVASC) 10 MG tablet TAKE ONE TABLET BY MOUTH ONCE DAILY 09/24/14  Yes Chelle Jeffery, PA-C  aspirin 81 MG tablet Take 81 mg by mouth daily.   Yes Historical Provider, MD  fish oil-omega-3 fatty acids 1000 MG capsule Take 1 g by mouth daily.   Yes Historical Provider, MD  glipiZIDE (GLUCOTROL XL) 10 MG 24 hr tablet TAKE  ONE TABLET BY MOUTH ONCE DAILY 09/24/14  Yes Chelle Jeffery, PA-C  lidocaine-prilocaine (EMLA) cream Apply to affected area once Patient taking differently: Apply 1 application topically as needed. Apply to affected area once 11/21/14  Yes Heath Lark, MD  pravastatin (PRAVACHOL) 40 MG tablet TAKE ONE TABLET BY MOUTH ONCE DAILY 09/24/14  Yes Chelle Jeffery, PA-C  predniSONE (DELTASONE) 20 MG tablet Take 3 tablets (60 mg total) by mouth daily. Take on days 1-5 of chemotherapy. 11/21/14  Yes Heath Lark, MD  prochlorperazine (COMPAZINE) 10 MG tablet Take 1 tablet (10 mg total) by mouth every 6 (six) hours as needed (Nausea or vomiting). 11/21/14  Yes Heath Lark, MD  sitaGLIPtin (JANUVIA) 50 MG tablet Take 1 tablet (50 mg total) by mouth daily.  09/24/14  Yes Chelle Jeffery, PA-C  glucose blood (ACCU-CHEK AVIVA PLUS) test strip Use as instructed 09/24/14   Chelle Jeffery, PA-C  hydrochlorothiazide (HYDRODIURIL) 25 MG tablet TAKE ONE TABLET BY MOUTH ONCE DAILY Patient not taking: Reported on 11/22/2014 09/24/14   Harrison Mons, PA-C  HYDROcodone-acetaminophen (NORCO) 5-325 MG per tablet Take 1-2 tablets by mouth every 6 (six) hours as needed. Patient not taking: Reported on 11/22/2014 11/19/14   Fanny Skates, MD  losartan (COZAAR) 100 MG tablet Take 1 tablet (100 mg total) by mouth daily. Patient not taking: Reported on 11/22/2014 09/24/14   Chelle Jeffery, PA-C  ondansetron (ZOFRAN) 8 MG tablet Take 1 tablet (8 mg total) by mouth 2 (two) times daily. Start the day after chemo for 3 days. Then as needed for nausea or vomiting. 11/21/14   Ni Gorsuch, MD   BP 121/57 mmHg  Pulse 92  Temp(Src) 97.5 F (36.4 C) (Oral)  Resp 20  SpO2 97% Physical Exam  Constitutional: He is oriented to person, place, and time. Vital signs are normal. He appears well-developed and well-nourished.  Non-toxic appearance. He does not appear ill. No distress.  HENT:  Head: Normocephalic and atraumatic.  Nose: Nose normal.  Mouth/Throat: Oropharynx is clear and moist. No oropharyngeal exudate.  Eyes: Conjunctivae and EOM are normal. Pupils are equal, round, and reactive to light. No scleral icterus.  Neck: Normal range of motion. Neck supple. No tracheal deviation, no edema, no erythema and normal range of motion present. No thyroid mass and no thyromegaly present.  Cardiovascular: Normal rate, regular rhythm, S1 normal, S2 normal, normal heart sounds, intact distal pulses and normal pulses.  Exam reveals no gallop and no friction rub.   No murmur heard. Pulses:      Radial pulses are 2+ on the right side, and 2+ on the left side.       Dorsalis pedis pulses are 2+ on the right side, and 2+ on the left side.  Pulmonary/Chest: Effort normal and breath sounds  normal. No respiratory distress. He has no wheezes. He has no rhonchi. He has no rales. He exhibits tenderness.  R chest wall TTP at incision site.  Abdominal: Soft. Normal appearance and bowel sounds are normal. He exhibits no distension, no ascites and no mass. There is no hepatosplenomegaly. There is no tenderness. There is no rebound, no guarding and no CVA tenderness.  Musculoskeletal: Normal range of motion. He exhibits no edema or tenderness.  Lymphadenopathy:    He has no cervical adenopathy.  Neurological: He is alert and oriented to person, place, and time. He has normal strength. No cranial nerve deficit or sensory deficit.  Skin: Skin is warm, dry and intact. No petechiae and no rash noted.  He is not diaphoretic. No erythema. No pallor.  Nursing note and vitals reviewed.   ED Course  Procedures (including critical care time) Labs Review Labs Reviewed  CBC WITH DIFFERENTIAL/PLATELET - Abnormal; Notable for the following:    RBC 3.17 (*)    Hemoglobin 8.1 (*)    HCT 25.4 (*)    MCH 25.6 (*)    RDW 16.6 (*)    Platelets 107 (*)    Monocytes Relative 20 (*)    Monocytes Absolute 2.0 (*)    All other components within normal limits  COMPREHENSIVE METABOLIC PANEL - Abnormal; Notable for the following:    Sodium 125 (*)    Chloride 94 (*)    CO2 19 (*)    Glucose, Bld 437 (*)    BUN 76 (*)    Creatinine, Ser 2.45 (*)    Calcium 8.4 (*)    Total Protein 6.4 (*)    Albumin 2.8 (*)    GFR calc non Af Amer 26 (*)    GFR calc Af Amer 30 (*)    All other components within normal limits  CBG MONITORING, ED - Abnormal; Notable for the following:    Glucose-Capillary 266 (*)    All other components within normal limits  I-STAT CHEM 8, ED - Abnormal; Notable for the following:    Sodium 127 (*)    Chloride 96 (*)    BUN 77 (*)    Creatinine, Ser 2.60 (*)    Glucose, Bld 445 (*)    Hemoglobin 8.8 (*)    HCT 26.0 (*)    All other components within normal limits    Imaging  Review No results found.   EKG Interpretation None      MDM   Final diagnoses:  None   patient presents emergency primary for hyperglycemia in the setting of recent panel and prednisone therapy. Patient is not in DKA. Will give IV fluids to help with blood sugar. I have paged his oncologist to discuss disposition.  I spoke with Dr. Thora Lance who suggested the patient does not need to be admitted simply for hyperglycemia. I will lower his blood sugar in the emergency department and provide him with close follow-up.  He was given 2L IVF and repeat FS was 260.  Education was given and need for close follow up.  He was told to continue pretreatment for chemo and call his PCP for further guidance regarding elevated sugars.  His continues to not complain of any symptoms.  He appears well and in NAD, his VS remain within his normal limits and he is safe for DC.  Everlene Balls, MD 11/23/14 [phone]

## 2014-11-23 NOTE — Discharge Instructions (Signed)
High Blood Sugar Mr. Wing, your blood sugar is down to 260. Continue to take medication prescribed by your oncologist, prednisone and allopurinol. This is important for your chemotherapy. Call your doctor tomorrow for further control of her high blood sugars.  Your blood sugar will go back up while taking these medications. If you begin to develop any symptoms come to the emergency department immediately. Thank you. High blood sugar (hyperglycemia) means that the level of sugar in your blood is higher than it should be. Signs of high blood sugar include:  Feeling thirsty.  Frequent peeing (urinating).  Feeling tired or sleepy.  Dry mouth.  Vision changes.  Feeling weak.  Feeling hungry but losing weight.  Numbness and tingling in your hands or feet.  Headache. When you ignore these signs, your blood sugar may keep going up. These problems may get worse, and other problems may begin. HOME CARE  Check your blood sugars as told by your doctor. Write down the numbers with the date and time.  Take the right amount of insulin or diabetes pills at the right time. Write down the dose with date and time.  Refill your insulin or diabetes pills before running out.  Watch what you eat. Follow your meal plan.  Drink liquids without sugar, such as water. Check with your doctor if you have kidney or heart disease.  Follow your doctor's orders for exercise. Exercise at the same time of day.  Keep your doctor's appointments. GET HELP RIGHT AWAY IF:   You have trouble thinking or are confused.  You have fast breathing with fruity smelling breath.  You pass out (faint).  You have 2 to 3 days of high blood sugars and you do not know why.  You have chest pain.  You are feeling sick to your stomach (nauseous) or throwing up (vomiting).  You have sudden vision changes. MAKE SURE YOU:   Understand these instructions.  Will watch your condition.  Will get help right away if you are  not doing well or get worse. Document Released: 04/18/2009 Document Revised: 09/13/2011 Document Reviewed: 04/18/2009 [facility] Patient Information 2015 [place], [place]. This information is not intended to replace advice given to you by your health care provider. Make sure you discuss any questions you have with your health care provider.

## 2014-11-23 NOTE — ED Notes (Signed)
Pt ambulating independently w/ steady gait on d/c in no acute distress, A&Ox4.D/c instructions reviewed w/ pt and family - pt and family deny any further questions or concerns at present.  

## 2014-11-24 NOTE — Progress Notes (Signed)
Quick Note:  Inform patient of Pathology report,. Let him know that, as expected, his biopsy showed large B-cell lymphoma. I will discuss in detail at next OV but Dr. Alvy Bimler should discuss with him soon to plan treatment  hmi ______

## 2014-11-25 LAB — CBG MONITORING, ED: Glucose-Capillary: 364 mg/dL — ABNORMAL HIGH (ref 65–99)

## 2014-11-26 ENCOUNTER — Other Ambulatory Visit: Payer: Self-pay | Admitting: *Deleted

## 2014-11-27 ENCOUNTER — Ambulatory Visit (HOSPITAL_COMMUNITY)
Admission: RE | Admit: 2014-11-27 | Discharge: 2014-11-27 | Disposition: A | Discharge status: Home / Self Care | Payer: Medicare Other | Source: Ambulatory Visit | Attending: Hematology and Oncology | Admitting: Hematology and Oncology

## 2014-11-27 ENCOUNTER — Other Ambulatory Visit: Payer: Medicare Other

## 2014-11-27 ENCOUNTER — Telehealth: Payer: Self-pay | Admitting: *Deleted

## 2014-11-27 ENCOUNTER — Telehealth: Payer: Self-pay | Admitting: Hematology and Oncology

## 2014-11-27 ENCOUNTER — Other Ambulatory Visit: Payer: Self-pay | Admitting: *Deleted

## 2014-11-27 ENCOUNTER — Other Ambulatory Visit: Payer: Self-pay | Admitting: Hematology and Oncology

## 2014-11-27 DIAGNOSIS — E785 Hyperlipidemia, unspecified: Secondary | ICD-10-CM | POA: Insufficient documentation

## 2014-11-27 DIAGNOSIS — Z01818 Encounter for other preprocedural examination: Secondary | ICD-10-CM | POA: Insufficient documentation

## 2014-11-27 DIAGNOSIS — I129 Hypertensive chronic kidney disease with stage 1 through stage 4 chronic kidney disease, or unspecified chronic kidney disease: Secondary | ICD-10-CM

## 2014-11-27 DIAGNOSIS — Z87891 Personal history of nicotine dependence: Secondary | ICD-10-CM | POA: Diagnosis not present

## 2014-11-27 DIAGNOSIS — C8590 Non-Hodgkin lymphoma, unspecified, unspecified site: Secondary | ICD-10-CM | POA: Diagnosis not present

## 2014-11-27 NOTE — Progress Notes (Signed)
  Echocardiogram 2D Echocardiogram has been performed.  Bobbye Charleston 11/27/2014, 12:43 PM

## 2014-11-27 NOTE — Telephone Encounter (Signed)
Lft msg for pt confirming chemo edu class moved to 05/26 before chemo due to cancellation in error.... KJ

## 2014-11-28 ENCOUNTER — Ambulatory Visit (HOSPITAL_BASED_OUTPATIENT_CLINIC_OR_DEPARTMENT_OTHER): Payer: Medicare Other

## 2014-11-28 ENCOUNTER — Other Ambulatory Visit: Payer: Medicare Other

## 2014-11-28 ENCOUNTER — Telehealth: Payer: Self-pay | Admitting: *Deleted

## 2014-11-28 VITALS — BP 130/58 | HR 111 | Temp 99.2°F | Resp 18

## 2014-11-28 DIAGNOSIS — C8590 Non-Hodgkin lymphoma, unspecified, unspecified site: Secondary | ICD-10-CM

## 2014-11-28 DIAGNOSIS — N183 Chronic kidney disease, stage 3 unspecified: Secondary | ICD-10-CM

## 2014-11-28 DIAGNOSIS — E790 Hyperuricemia without signs of inflammatory arthritis and tophaceous disease: Secondary | ICD-10-CM

## 2014-11-28 DIAGNOSIS — R740 Nonspecific elevation of levels of transaminase and lactic acid dehydrogenase [LDH]: Secondary | ICD-10-CM | POA: Diagnosis not present

## 2014-11-28 MED ORDER — SODIUM CHLORIDE 0.9 % IJ SOLN
10.0000 mL | INTRAMUSCULAR | Status: DC | PRN
  Administered 2014-11-28: 10 mL
  Filled 2014-11-28: qty 10

## 2014-11-28 MED ORDER — HEPARIN SOD (PORK) LOCK FLUSH 100 UNIT/ML IV SOLN
500.0000 [IU] | Freq: Once | INTRAVENOUS | Status: AC | PRN
  Administered 2014-11-28: 500 [IU]
  Filled 2014-11-28: qty 5

## 2014-11-28 MED ORDER — SODIUM CHLORIDE 0.9 % IV SOLN
Freq: Once | INTRAVENOUS | Status: AC
  Administered 2014-11-28: 12:00:00 via INTRAVENOUS

## 2014-11-28 MED ORDER — RASBURICASE 1.5 MG IV SOLR
3.0000 mg | Freq: Once | INTRAVENOUS | Status: AC
  Administered 2014-11-28: 3 mg via INTRAVENOUS
  Filled 2014-11-28: qty 2

## 2014-11-28 NOTE — Patient Instructions (Signed)

## 2014-11-28 NOTE — Progress Notes (Signed)
Per Dr. Alvy Bimler pt is to take Prednisone again starting on Day #2 of chemotherapy x 4 days.   Instructed pt and wife to start Prednisone 60 mg daily Sat, Sun, Mon and Tues.   Chemo starts tomorrow on Friday.  They verbalized understanding.

## 2014-11-28 NOTE — Telephone Encounter (Signed)
Spoke with wife. Clarified no prednisone today or tomorrow. Will begin Saturday for 4 days per Dr Alvy Bimler

## 2014-11-28 NOTE — Telephone Encounter (Signed)
Note opened in error.

## 2014-11-28 NOTE — Telephone Encounter (Signed)
TC from pt's wife with question regarding prednisone-how long does he take it?  He is coming to infusion center today @ 12noon and again tomorrow for 1st time [facility]. Please call wife to let her know.

## 2014-11-29 ENCOUNTER — Ambulatory Visit (HOSPITAL_BASED_OUTPATIENT_CLINIC_OR_DEPARTMENT_OTHER): Payer: Medicare Other

## 2014-11-29 ENCOUNTER — Other Ambulatory Visit: Payer: Medicare Other

## 2014-11-29 ENCOUNTER — Ambulatory Visit (HOSPITAL_COMMUNITY)
Admission: RE | Admit: 2014-11-29 | Discharge: 2014-11-29 | Disposition: A | Discharge status: Home / Self Care | Payer: Medicare Other | Source: Ambulatory Visit | Attending: Hematology and Oncology | Admitting: Hematology and Oncology

## 2014-11-29 ENCOUNTER — Other Ambulatory Visit: Payer: Self-pay

## 2014-11-29 ENCOUNTER — Ambulatory Visit (HOSPITAL_BASED_OUTPATIENT_CLINIC_OR_DEPARTMENT_OTHER): Payer: Self-pay

## 2014-11-29 ENCOUNTER — Other Ambulatory Visit: Payer: Self-pay | Admitting: *Deleted

## 2014-11-29 ENCOUNTER — Ambulatory Visit: Payer: Medicare Other

## 2014-11-29 VITALS — BP 112/47 | HR 120 | Temp 98.9°F | Resp 20

## 2014-11-29 DIAGNOSIS — Z87891 Personal history of nicotine dependence: Secondary | ICD-10-CM | POA: Diagnosis not present

## 2014-11-29 DIAGNOSIS — Z5112 Encounter for antineoplastic immunotherapy: Secondary | ICD-10-CM

## 2014-11-29 DIAGNOSIS — R05 Cough: Secondary | ICD-10-CM | POA: Diagnosis not present

## 2014-11-29 DIAGNOSIS — I129 Hypertensive chronic kidney disease with stage 1 through stage 4 chronic kidney disease, or unspecified chronic kidney disease: Secondary | ICD-10-CM | POA: Diagnosis not present

## 2014-11-29 DIAGNOSIS — N183 Chronic kidney disease, stage 3 (moderate): Secondary | ICD-10-CM | POA: Diagnosis not present

## 2014-11-29 DIAGNOSIS — R509 Fever, unspecified: Secondary | ICD-10-CM | POA: Insufficient documentation

## 2014-11-29 DIAGNOSIS — C8590 Non-Hodgkin lymphoma, unspecified, unspecified site: Secondary | ICD-10-CM

## 2014-11-29 DIAGNOSIS — Z87448 Personal history of other diseases of urinary system: Secondary | ICD-10-CM | POA: Diagnosis not present

## 2014-11-29 DIAGNOSIS — E119 Type 2 diabetes mellitus without complications: Secondary | ICD-10-CM | POA: Insufficient documentation

## 2014-11-29 DIAGNOSIS — D630 Anemia in neoplastic disease: Secondary | ICD-10-CM

## 2014-11-29 DIAGNOSIS — Z7982 Long term (current) use of aspirin: Secondary | ICD-10-CM | POA: Insufficient documentation

## 2014-11-29 DIAGNOSIS — E1129 Type 2 diabetes mellitus with other diabetic kidney complication: Secondary | ICD-10-CM | POA: Insufficient documentation

## 2014-11-29 DIAGNOSIS — Z7952 Long term (current) use of systemic steroids: Secondary | ICD-10-CM | POA: Diagnosis not present

## 2014-11-29 DIAGNOSIS — Z5111 Encounter for antineoplastic chemotherapy: Secondary | ICD-10-CM

## 2014-11-29 DIAGNOSIS — Z8572 Personal history of non-Hodgkin lymphomas: Secondary | ICD-10-CM | POA: Insufficient documentation

## 2014-11-29 DIAGNOSIS — Z862 Personal history of diseases of the blood and blood-forming organs and certain disorders involving the immune mechanism: Secondary | ICD-10-CM | POA: Insufficient documentation

## 2014-11-29 DIAGNOSIS — Z5189 Encounter for other specified aftercare: Secondary | ICD-10-CM

## 2014-11-29 DIAGNOSIS — Z8701 Personal history of pneumonia (recurrent): Secondary | ICD-10-CM | POA: Insufficient documentation

## 2014-11-29 DIAGNOSIS — R590 Localized enlarged lymph nodes: Secondary | ICD-10-CM | POA: Insufficient documentation

## 2014-11-29 DIAGNOSIS — Z79899 Other long term (current) drug therapy: Secondary | ICD-10-CM | POA: Insufficient documentation

## 2014-11-29 DIAGNOSIS — Z760 Encounter for issue of repeat prescription: Secondary | ICD-10-CM | POA: Diagnosis present

## 2014-11-29 LAB — URINALYSIS, MICROSCOPIC - CHCC
Bilirubin (Urine): NEGATIVE
GLUCOSE UR CHCC: NEGATIVE mg/dL
Ketones: NEGATIVE mg/dL
Leukocyte Esterase: NEGATIVE
Nitrite: NEGATIVE
PH: 5 (ref 4.6–8.0)
Protein: <30 mg/dL
Specific Gravity, Urine: 1.015 (ref 1.003–1.035)
Urobilinogen, UR: 0.2 mg/dL (ref 0.2–1)

## 2014-11-29 MED ORDER — DIPHENHYDRAMINE HCL 50 MG/ML IJ SOLN
25.0000 mg | Freq: Once | INTRAMUSCULAR | Status: AC | PRN
  Administered 2014-11-29: 25 mg via INTRAVENOUS

## 2014-11-29 MED ORDER — MEPERIDINE HCL 25 MG/ML IJ SOLN
12.5000 mg | Freq: Once | INTRAMUSCULAR | Status: AC
  Administered 2014-11-29: 12.5 mg via INTRAVENOUS

## 2014-11-29 MED ORDER — FAMOTIDINE IN NACL 20-0.9 MG/50ML-% IV SOLN
20.0000 mg | Freq: Once | INTRAVENOUS | Status: AC | PRN
  Administered 2014-11-29: 20 mg via INTRAVENOUS

## 2014-11-29 MED ORDER — DIPHENHYDRAMINE HCL 25 MG PO CAPS
50.0000 mg | ORAL_CAPSULE | Freq: Once | ORAL | Status: AC
  Administered 2014-11-29: 50 mg via ORAL

## 2014-11-29 MED ORDER — SODIUM CHLORIDE 0.9 % IV SOLN
10.0000 mg | Freq: Once | INTRAVENOUS | Status: AC
  Filled 2014-11-29: qty 1

## 2014-11-29 MED ORDER — CLONIDINE HCL 0.1 MG PO TABS
0.1000 mg | ORAL_TABLET | Freq: Once | ORAL | Status: AC
  Administered 2014-11-29: 0.1 mg via ORAL

## 2014-11-29 MED ORDER — SODIUM CHLORIDE 0.9 % IV SOLN
Freq: Once | INTRAVENOUS | Status: AC
  Administered 2014-11-29: 10:00:00 via INTRAVENOUS

## 2014-11-29 MED ORDER — DIPHENHYDRAMINE HCL 25 MG PO CAPS
ORAL_CAPSULE | ORAL | Status: AC
  Filled 2014-11-29: qty 2

## 2014-11-29 MED ORDER — MEPERIDINE HCL 25 MG/ML IJ SOLN
INTRAMUSCULAR | Status: AC
  Filled 2014-11-29: qty 1

## 2014-11-29 MED ORDER — METHYLPREDNISOLONE SODIUM SUCC 125 MG IJ SOLR
INTRAMUSCULAR | Status: AC
  Filled 2014-11-29: qty 2

## 2014-11-29 MED ORDER — ACETAMINOPHEN 325 MG PO TABS
650.0000 mg | ORAL_TABLET | Freq: Once | ORAL | Status: AC
  Administered 2014-11-29: 650 mg via ORAL

## 2014-11-29 MED ORDER — METHYLPREDNISOLONE SODIUM SUCC 125 MG IJ SOLR
125.0000 mg | Freq: Once | INTRAMUSCULAR | Status: AC
  Administered 2014-11-29: 125 mg via INTRAVENOUS

## 2014-11-29 MED ORDER — DEXAMETHASONE SODIUM PHOSPHATE 10 MG/ML IJ SOLN
10.0000 mg | Freq: Once | INTRAMUSCULAR | Status: AC
  Filled 2014-11-29: qty 1

## 2014-11-29 MED ORDER — SODIUM CHLORIDE 0.9 % IV SOLN
Freq: Once | INTRAVENOUS | Status: AC
  Administered 2014-11-29: 11:00:00 via INTRAVENOUS
  Filled 2014-11-29: qty 8

## 2014-11-29 MED ORDER — CLONIDINE HCL 0.1 MG PO TABS
ORAL_TABLET | ORAL | Status: AC
  Filled 2014-11-29: qty 1

## 2014-11-29 MED ORDER — SODIUM CHLORIDE 0.9 % IJ SOLN
10.0000 mL | INTRAMUSCULAR | Status: DC | PRN
  Administered 2014-11-29: 10 mL
  Filled 2014-11-29: qty 10

## 2014-11-29 MED ORDER — ACETAMINOPHEN 325 MG PO TABS
ORAL_TABLET | ORAL | Status: AC
  Filled 2014-11-29: qty 2

## 2014-11-29 MED ORDER — SODIUM CHLORIDE 0.9 % IV SOLN
750.0000 mg/m2 | Freq: Once | INTRAVENOUS | Status: AC
  Administered 2014-11-29: 1420 mg via INTRAVENOUS
  Filled 2014-11-29: qty 71

## 2014-11-29 MED ORDER — SODIUM CHLORIDE 0.9 % IV SOLN
375.0000 mg/m2 | Freq: Once | INTRAVENOUS | Status: AC
  Administered 2014-11-29: 700 mg via INTRAVENOUS
  Filled 2014-11-29: qty 70

## 2014-11-29 MED ORDER — DOXORUBICIN HCL CHEMO IV INJECTION 2 MG/ML
50.0000 mg/m2 | Freq: Once | INTRAVENOUS | Status: AC
  Administered 2014-11-29: 96 mg via INTRAVENOUS
  Filled 2014-11-29: qty 48

## 2014-11-29 MED ORDER — PEGFILGRASTIM 6 MG/0.6ML ~~LOC~~ PSKT
6.0000 mg | PREFILLED_SYRINGE | Freq: Once | SUBCUTANEOUS | Status: AC
  Administered 2014-11-29: 6 mg via SUBCUTANEOUS
  Filled 2014-11-29: qty 0.6

## 2014-11-29 MED ORDER — SODIUM CHLORIDE 0.9 % IV SOLN
10.0000 mg | Freq: Once | INTRAVENOUS | Status: AC
  Administered 2014-11-29: 10 mg via INTRAVENOUS
  Filled 2014-11-29: qty 1

## 2014-11-29 MED ORDER — VINCRISTINE SULFATE CHEMO INJECTION 1 MG/ML
2.0000 mg | Freq: Once | INTRAVENOUS | Status: AC
  Administered 2014-11-29: 2 mg via INTRAVENOUS
  Filled 2014-11-29: qty 2

## 2014-11-29 MED ORDER — METHYLPREDNISOLONE SODIUM SUCC 125 MG IJ SOLR
125.0000 mg | Freq: Once | INTRAMUSCULAR | Status: AC | PRN
  Administered 2014-11-29: 125 mg via INTRAVENOUS

## 2014-11-29 MED ORDER — HEPARIN SOD (PORK) LOCK FLUSH 100 UNIT/ML IV SOLN
500.0000 [IU] | Freq: Once | INTRAVENOUS | Status: AC | PRN
  Administered 2014-11-29: 500 [IU]
  Filled 2014-11-29: qty 5

## 2014-11-29 NOTE — Progress Notes (Signed)
4650 Notified Dr.Gudena of pt's temp of 100 and elevated hr of 118. New orders received to have a stat chest x-ray and u/a and to proceed with treatment as ordered. 1015 Pt returned from x-ray, notified Dr.Gudena's nurse, Beverlee Nims, that doctor should be receiving results shortly and that we will proceed with treatment as ordered. 1250 Pt c/o sweating, visible sweat noted on forehead and head.Pt states he didn't take his blood sugar today and is on oral hypoglycemics. Contacted Dr.Gudena's nurse and new orders received for stat blood glucose. Order placed, lab called. 1350 This nurse noticed pt diaphoretic and saw pt go into rigors. Rituxan stopped, NS to gravity started, VS taken, Dr.Gudena notified and at bedside @ 1400. See Flowsheet and MAR for VS and medication details 3546 See MAR, Solumedrol given 1355 See VS, see EPIC 1401 See MAR, Pepcid given 1403 See MAR Solumedrol given 1405 See VS, see EPIC 1407 See MAR, Benadryl given 1410 See VS, see EPIC 1415 See MAR, Clonadine given 1421 See MAR,Demerol given 1427 See VS, see EPIC See MAR, Demerol given 1428 See MAR, Decadron given 1430 See MAR, Clonadine given 1434 See VS, see EPIC See MAR, Clonadine 1500 See VS, see EPIC 1550 Notified Dr.Gudena pt stable.  Per MD discharge home without rechallenging.

## 2014-11-29 NOTE — Patient Instructions (Signed)
[name] Discharge Instructions for Patients Receiving Chemotherapy  Today you received the following chemotherapy agents Adriamycin, Vincristine, Cytoxan and Rituxan.  To help prevent nausea and vomiting after your treatment, we encourage you to take your nausea medication as prescribed.   If you develop nausea and vomiting that is not controlled by your nausea medication, call the clinic.   BELOW ARE SYMPTOMS THAT SHOULD BE REPORTED IMMEDIATELY:  *FEVER GREATER THAN 100.5 F  *CHILLS WITH OR WITHOUT FEVER  NAUSEA AND VOMITING THAT IS NOT CONTROLLED WITH YOUR NAUSEA MEDICATION  *UNUSUAL SHORTNESS OF BREATH  *UNUSUAL BRUISING OR BLEEDING  TENDERNESS IN MOUTH AND THROAT WITH OR WITHOUT PRESENCE OF ULCERS  *URINARY PROBLEMS  *BOWEL PROBLEMS  UNUSUAL RASH Items with * indicate a potential emergency and should be followed up as soon as possible.  Feel free to call the clinic you have any questions or concerns. The clinic phone number is [phone].  Please show the [facility] at check-in to the Emergency Department and triage nurse.

## 2014-11-30 ENCOUNTER — Encounter (HOSPITAL_COMMUNITY): Payer: Self-pay | Admitting: Emergency Medicine

## 2014-11-30 ENCOUNTER — Emergency Department (HOSPITAL_COMMUNITY)
Admission: EM | Admit: 2014-11-30 | Discharge: 2014-11-30 | Disposition: A | Discharge status: Home / Self Care | Payer: Medicare Other | Attending: Emergency Medicine | Admitting: Emergency Medicine

## 2014-11-30 ENCOUNTER — Ambulatory Visit (HOSPITAL_BASED_OUTPATIENT_CLINIC_OR_DEPARTMENT_OTHER): Payer: Medicare Other

## 2014-11-30 VITALS — BP 117/54 | HR 90 | Temp 97.0°F | Resp 18

## 2014-11-30 DIAGNOSIS — C8590 Non-Hodgkin lymphoma, unspecified, unspecified site: Secondary | ICD-10-CM

## 2014-11-30 DIAGNOSIS — N183 Chronic kidney disease, stage 3 unspecified: Secondary | ICD-10-CM

## 2014-11-30 DIAGNOSIS — Z5189 Encounter for other specified aftercare: Secondary | ICD-10-CM | POA: Diagnosis not present

## 2014-11-30 DIAGNOSIS — Z79899 Other long term (current) drug therapy: Secondary | ICD-10-CM

## 2014-11-30 HISTORY — DX: Malignant (primary) neoplasm, unspecified: C80.1

## 2014-11-30 MED ORDER — HEPARIN SOD (PORK) LOCK FLUSH 100 UNIT/ML IV SOLN
500.0000 [IU] | Freq: Once | INTRAVENOUS | Status: AC | PRN
  Administered 2014-11-30: 500 [IU]
  Filled 2014-11-30: qty 5

## 2014-11-30 MED ORDER — SODIUM CHLORIDE 0.9 % IJ SOLN
10.0000 mL | INTRAMUSCULAR | Status: DC | PRN
  Administered 2014-11-30: 10 mL
  Filled 2014-11-30: qty 10

## 2014-11-30 MED ORDER — SODIUM CHLORIDE 0.9 % IV SOLN
Freq: Once | INTRAVENOUS | Status: AC
  Administered 2014-11-30: 10:00:00 via INTRAVENOUS

## 2014-11-30 MED ORDER — PEGFILGRASTIM INJECTION 6 MG/0.6ML ~~LOC~~
6.0000 mg | PREFILLED_SYRINGE | Freq: Once | SUBCUTANEOUS | Status: AC
  Administered 2014-11-30: 6 mg via SUBCUTANEOUS

## 2014-11-30 NOTE — ED Provider Notes (Signed)
CSN: [id number]     Arrival date & time 11/29/14  2321 History   First MD Initiated Contact with Patient 11/30/14 0100     Chief Complaint  Patient presents with  . Cancer     (Consider location/radiation/quality/duration/timing/severity/associated sxs/prior Treatment) HPI Comments: States a Neulasta off the patch was placed after his chemotherapy today he noticed when he woke up earlier this evening than it was off.  He came to the emergency department.  On the recommendation of the on-call oncologist.  The history is provided by the patient.    Past Medical History  Diagnosis Date  . Essential hypertension, benign   . Type II or unspecified type diabetes mellitus with renal manifestations, not stated as uncontrolled   . Chronic kidney disease, stage III (moderate)   . History of tobacco abuse     quit 12/2008  . History of ETOH abuse     quit 1999  . Erectile dysfunction   . Shortness of breath dyspnea     exertion  . Pneumonia   . Anemia   . Cramps, extremity     hx of in legs bilat; currently having issues in toes bilat   . Hyperparathyroidism     secondary / renal per H&P with [facility] 07/17/2014   . Cancer 2016    lymphoma   Past Surgical History  Procedure Laterality Date  . Arm wound repair / closure      chainsaw; left forearm/wrist  . Colonscopy     . Axillary lymph node biopsy Right 11/19/2014    Procedure: EXCISIONAL BIOPSY DEEP RIGHT AXILLARY LYMPH NODE ;  Surgeon: Fanny Skates, MD;  Location: [facility];  Service: General;  Laterality: Right;  . Portacath placement N/A 11/19/2014    Procedure: INSERTION PORT-A-CATH;  Surgeon: Fanny Skates, MD;  Location: [facility];  Service: General;  Laterality: N/A;   Family History  Problem Relation Age of Onset  . Cancer Father     colon  . Cancer Sister     Breast  . Stroke Brother   . Stroke Brother    History  Substance Use Topics  . Smoking status: Former Smoker -- 1.00 packs/day for 50 years    Types:  Cigarettes    Quit date: 07/05/2004  . Smokeless tobacco: Never Used  . Alcohol Use: Yes     Comment: hx of ETOH use; quit drinking approx 2008     Review of Systems  Constitutional: Negative for fever.  Respiratory: Negative for shortness of breath.   Cardiovascular: Negative for chest pain and leg swelling.  Skin: Negative for wound.  Neurological: Negative for dizziness.  All other systems reviewed and are negative.     Allergies  Ace inhibitors and Rituximab  Home Medications   Prior to Admission medications   Medication Sig Start Date End Date Taking? Authorizing Provider  allopurinol (ZYLOPRIM) 100 MG tablet Take 1 tablet (100 mg total) by mouth daily. 11/21/14  Yes Heath Lark, MD  amLODipine (NORVASC) 10 MG tablet TAKE ONE TABLET BY MOUTH ONCE DAILY 09/24/14  Yes Chelle Jeffery, PA-C  aspirin 81 MG tablet Take 81 mg by mouth daily.   Yes Historical Provider, MD  fish oil-omega-3 fatty acids 1000 MG capsule Take 1 g by mouth daily.   Yes Historical Provider, MD  glipiZIDE (GLUCOTROL XL) 10 MG 24 hr tablet TAKE ONE TABLET BY MOUTH ONCE DAILY 09/24/14  Yes Chelle Jeffery, PA-C  prochlorperazine (COMPAZINE) 10 MG tablet Take 1 tablet (10  mg total) by mouth every 6 (six) hours as needed (Nausea or vomiting). 11/21/14  Yes Heath Lark, MD  sitaGLIPtin (JANUVIA) 50 MG tablet Take 1 tablet (50 mg total) by mouth daily. 09/24/14  Yes Chelle Jeffery, PA-C  glucose blood (ACCU-CHEK AVIVA PLUS) test strip Use as instructed 09/24/14   Chelle Jeffery, PA-C  hydrochlorothiazide (HYDRODIURIL) 25 MG tablet TAKE ONE TABLET BY MOUTH ONCE DAILY Patient not taking: Reported on 11/22/2014 09/24/14   Harrison Mons, PA-C  HYDROcodone-acetaminophen (NORCO) 5-325 MG per tablet Take 1-2 tablets by mouth every 6 (six) hours as needed. Patient not taking: Reported on 11/22/2014 11/19/14   Fanny Skates, MD  lidocaine-prilocaine (EMLA) cream Apply to affected area once Patient taking differently: Apply 1  application topically as needed. Apply to affected area once 11/21/14   Heath Lark, MD  losartan (COZAAR) 100 MG tablet Take 1 tablet (100 mg total) by mouth daily. Patient not taking: Reported on 11/22/2014 09/24/14   Chelle Jeffery, PA-C  ondansetron (ZOFRAN) 8 MG tablet Take 1 tablet (8 mg total) by mouth 2 (two) times daily. Start the day after chemo for 3 days. Then as needed for nausea or vomiting. 11/21/14   Heath Lark, MD  pravastatin (PRAVACHOL) 40 MG tablet TAKE ONE TABLET BY MOUTH ONCE DAILY 09/24/14   Chelle Jeffery, PA-C  predniSONE (DELTASONE) 20 MG tablet Take 3 tablets (60 mg total) by mouth daily. Take on days 1-5 of chemotherapy. 11/21/14   Ni Gorsuch, MD   BP 124/60 mmHg  Pulse 95  Resp 20  SpO2 98% Physical Exam  Constitutional: He appears well-developed and well-nourished.  HENT:  Head: Normocephalic.  Eyes: Pupils are equal, round, and reactive to light.  Neck: Normal range of motion.  Cardiovascular: Normal rate.   Musculoskeletal: Normal range of motion.  Neurological: He is alert.  Skin: Skin is warm.  Nursing note and vitals reviewed.   ED Course  Procedures (including critical care time) Labs Review Labs Reviewed - No data to display  Imaging Review Dg Chest 2 View  11/29/2014   CLINICAL DATA:  History of lymphoma, now with coughing and fever ; long-term tobacco use, diabetes.  EXAM: CHEST  2 VIEW  COMPARISON:  Portable chest x-ray of Nov 19, 2014  FINDINGS: The lungs are borderline hypoinflated. The pulmonary interstitial markings have improved bilaterally. There is persistent subsegmental atelectasis at both lung bases. The hilar structures remain prominent. The heart is normal in size. The Port-A-Cath appliance tip projects over the midportion of the SVC. There is no pulmonary vascular congestion. The trachea is midline. There is no pleural effusion. The bony thorax is unremarkable.  IMPRESSION: Mild interval improvement in the appearance of the pulmonary  interstitium and of the basilar atelectasis or pneumonia. There is persistent bilateral hilar and mediastinal lymphadenopathy.   Electronically Signed   By: David  Martinique M.D.   On: 11/29/2014 10:14     EKG Interpretation None     I spoke with the oncologist said, on call, if available, a new patch should be placed in the emergency department if we cannot get one.  He is to go to the [facility] first thing in the morning for replacement patch I spoke to the ED pharmacist.  Unfortunately, the Neulasta opti patches are only available in the cancer center at this time MDM         Junius Creamer, NP 11/30/14 0149  Everlene Balls, MD 12/02/14 [id number]

## 2014-11-30 NOTE — Progress Notes (Signed)
Pt here for IVF and states he was seen in the ED last night due to Neulasta On-Pro coming off his abdomen last night while sleeping. States he woke up and there was a lot of bleeding coming from injection site and on-pro was off.  Patient brought in on-pro that had fallen off and has not received medication yet. Patient states he would rather receive neulasta injection today after IVF rather than another on-pro. Chemo end time at 1215 on 5/27; pt agreeable to stay until after 1215 for neulasta injection today.

## 2014-11-30 NOTE — Patient Instructions (Signed)
Dehydration, Adult Dehydration is when you lose more fluids from the body than you take in. Vital organs like the kidneys, brain, and heart cannot function without a proper amount of fluids and salt. Any loss of fluids from the body can cause dehydration.  CAUSES   Vomiting.  Diarrhea.  Excessive sweating.  Excessive urine output.  Fever. SYMPTOMS  Mild dehydration  Thirst.  Dry lips.  Slightly dry mouth. Moderate dehydration  Very dry mouth.  Sunken eyes.  Skin does not bounce back quickly when lightly pinched and released.  Dark urine and decreased urine production.  Decreased tear production.  Headache. Severe dehydration  Very dry mouth.  Extreme thirst.  Rapid, weak pulse (more than 100 beats per minute at rest).  Cold hands and feet.  Not able to sweat in spite of heat and temperature.  Rapid breathing.  Blue lips.  Confusion and lethargy.  Difficulty being awakened.  Minimal urine production.  No tears. DIAGNOSIS  Your caregiver will diagnose dehydration based on your symptoms and your exam. Blood and urine tests will help confirm the diagnosis. The diagnostic evaluation should also identify the cause of dehydration. TREATMENT  Treatment of mild or moderate dehydration can often be done at home by increasing the amount of fluids that you drink. It is best to drink small amounts of fluid more often. Drinking too much at one time can make vomiting worse. Refer to the home care instructions below. Severe dehydration needs to be treated at the hospital where you will probably be given intravenous (IV) fluids that contain water and electrolytes. HOME CARE INSTRUCTIONS   Ask your caregiver about specific rehydration instructions.  Drink enough fluids to keep your urine clear or pale yellow.  Drink small amounts frequently if you have nausea and vomiting.  Eat as you normally do.  Avoid:  Foods or drinks high in sugar.  Carbonated  drinks.  Juice.  Extremely hot or cold fluids.  Drinks with caffeine.  Fatty, greasy foods.  Alcohol.  Tobacco.  Overeating.  Gelatin desserts.  Wash your hands well to avoid spreading bacteria and viruses.  Only take over-the-counter or prescription medicines for pain, discomfort, or fever as directed by your caregiver.  Ask your caregiver if you should continue all prescribed and over-the-counter medicines.  Keep all follow-up appointments with your caregiver. SEEK MEDICAL CARE IF:  You have abdominal pain and it increases or stays in one area (localizes).  You have a rash, stiff neck, or severe headache.  You are irritable, sleepy, or difficult to awaken.  You are weak, dizzy, or extremely thirsty. SEEK IMMEDIATE MEDICAL CARE IF:   You are unable to keep fluids down or you get worse despite treatment.  You have frequent episodes of vomiting or diarrhea.  You have blood or green matter (bile) in your vomit.  You have blood in your stool or your stool looks black and tarry.  You have not urinated in 6 to 8 hours, or you have only urinated a small amount of very dark urine.  You have a fever.  You faint. MAKE SURE YOU:   Understand these instructions.  Will watch your condition.  Will get help right away if you are not doing well or get worse. Document Released: 06/21/2005 Document Revised: 09/13/2011 Document Reviewed: 02/08/2011 ExitCare Patient Information 2015 [employer]. This information is not intended to replace advice given to you by your health care provider. Make sure you discuss any questions you have with your health care   provider.  

## 2014-11-30 NOTE — ED Notes (Signed)
Pt arrived to the ED with a complaint of a medical device failure.  Pt was seen today for his first chemo treatment.  Pt was fitted with a Neulaesta pump.  At 1100 the patient woke up with the pump detached.  Pt has bleed some but no bleeding is seen at the present time

## 2014-11-30 NOTE — Discharge Instructions (Signed)
Unfortunately, we do not have the Neulasta opti patch in the emergency department.  You will have to go to the cancer center in the morning to have a replacement placed.

## 2014-12-01 LAB — URINE CULTURE

## 2014-12-03 ENCOUNTER — Other Ambulatory Visit: Payer: Self-pay | Admitting: *Deleted

## 2014-12-03 ENCOUNTER — Telehealth: Payer: Self-pay | Admitting: *Deleted

## 2014-12-03 ENCOUNTER — Ambulatory Visit: Payer: Medicare Other

## 2014-12-03 ENCOUNTER — Ambulatory Visit (HOSPITAL_BASED_OUTPATIENT_CLINIC_OR_DEPARTMENT_OTHER): Payer: Medicare Other | Admitting: Hematology and Oncology

## 2014-12-03 ENCOUNTER — Telehealth: Payer: Self-pay | Admitting: Hematology and Oncology

## 2014-12-03 ENCOUNTER — Encounter: Payer: Self-pay | Admitting: Hematology and Oncology

## 2014-12-03 ENCOUNTER — Other Ambulatory Visit (HOSPITAL_BASED_OUTPATIENT_CLINIC_OR_DEPARTMENT_OTHER): Payer: Medicare Other

## 2014-12-03 VITALS — BP 171/67 | HR 99 | Temp 98.1°F | Resp 18 | Ht 70.5 in | Wt 155.8 lb

## 2014-12-03 DIAGNOSIS — N183 Chronic kidney disease, stage 3 unspecified: Secondary | ICD-10-CM

## 2014-12-03 DIAGNOSIS — C8330 Diffuse large B-cell lymphoma, unspecified site: Secondary | ICD-10-CM

## 2014-12-03 DIAGNOSIS — E1121 Type 2 diabetes mellitus with diabetic nephropathy: Secondary | ICD-10-CM

## 2014-12-03 DIAGNOSIS — D630 Anemia in neoplastic disease: Secondary | ICD-10-CM | POA: Diagnosis not present

## 2014-12-03 DIAGNOSIS — C8590 Non-Hodgkin lymphoma, unspecified, unspecified site: Secondary | ICD-10-CM

## 2014-12-03 DIAGNOSIS — E1129 Type 2 diabetes mellitus with other diabetic kidney complication: Secondary | ICD-10-CM

## 2014-12-03 DIAGNOSIS — I129 Hypertensive chronic kidney disease with stage 1 through stage 4 chronic kidney disease, or unspecified chronic kidney disease: Secondary | ICD-10-CM

## 2014-12-03 LAB — COMPREHENSIVE METABOLIC PANEL (CC13)
ALBUMIN: 2.7 g/dL — AB (ref 3.5–5.0)
ALT: 42 U/L (ref 0–55)
ANION GAP: 7 meq/L (ref 3–11)
AST: 29 U/L (ref 5–34)
Alkaline Phosphatase: 108 U/L (ref 40–150)
BILIRUBIN TOTAL: 0.71 mg/dL (ref 0.20–1.20)
BUN: 49.4 mg/dL — AB (ref 7.0–26.0)
CO2: 21 meq/L — AB (ref 22–29)
CREATININE: 1.7 mg/dL — AB (ref 0.7–1.3)
Calcium: 8 mg/dL — ABNORMAL LOW (ref 8.4–10.4)
Chloride: 108 mEq/L (ref 98–109)
EGFR: 47 mL/min/{1.73_m2} — AB (ref >90)
Glucose: 365 mg/dl — ABNORMAL HIGH (ref 70–140)
Potassium: 5.3 mEq/L — ABNORMAL HIGH (ref 3.5–5.1)
SODIUM: 137 meq/L (ref 136–145)
Total Protein: 5.6 g/dL — ABNORMAL LOW (ref 6.4–8.3)

## 2014-12-03 LAB — CBC WITH DIFFERENTIAL/PLATELET
BASO%: 0.2 % (ref 0.0–2.0)
BASOS ABS: 0.1 10*3/uL (ref 0.0–0.1)
EOS%: 0 % (ref 0.0–7.0)
Eosinophils Absolute: 0 10*3/uL (ref 0.0–0.5)
HEMATOCRIT: 25.9 % — AB (ref 38.4–49.9)
HEMOGLOBIN: 8 g/dL — AB (ref 13.0–17.1)
LYMPH#: 0.6 10*3/uL — AB (ref 0.9–3.3)
LYMPH%: 1.4 % — AB (ref 14.0–49.0)
MCH: 26.3 pg — AB (ref 27.2–33.4)
MCHC: 30.9 g/dL — AB (ref 32.0–36.0)
MCV: 84.9 fL (ref 79.3–98.0)
MONO#: 0.2 10*3/uL (ref 0.1–0.9)
MONO%: 0.4 % (ref 0.0–14.0)
NEUT#: 43.6 10*3/uL — ABNORMAL HIGH (ref 1.5–6.5)
NEUT%: 98 % — AB (ref 39.0–75.0)
Platelets: 145 10*3/uL (ref 140–400)
RBC: 3.04 10*6/uL — ABNORMAL LOW (ref 4.20–5.82)
RDW: 19.5 % — ABNORMAL HIGH (ref 11.0–14.6)
WBC: 44.5 10*3/uL — ABNORMAL HIGH (ref 4.0–10.3)

## 2014-12-03 NOTE — Assessment & Plan Note (Signed)
He has chronic kidney disease and this is unrelated to the lymphoma. He did need not dose adjustment for his treatment.

## 2014-12-03 NOTE — Assessment & Plan Note (Signed)
I suspect the cause of anemia I will likely combination of anemia of chronic disease and possible bone marrow infiltration by lymphoma. I present time, he is not symptomatic. He can be observed and does not require blood transfusion.

## 2014-12-03 NOTE — Assessment & Plan Note (Signed)
He tolerated cycle one poorly, not unexpected due to large disease burden. Since then, he has significant regression of his lymphoma. I told the patient that the result indicated diffuse large B-cell lymphoma. Due to high risk of CNS disease, I recommend intrathecal chemotherapy given prophylactically. He agreed to proceed. I plan to do the procedure on 12/19/2014.

## 2014-12-03 NOTE — Assessment & Plan Note (Signed)
He has significant hyperglycemia recently due to prednisone therapy. His last dose of prednisone is today. He will continue insulin.

## 2014-12-03 NOTE — Assessment & Plan Note (Signed)
His blood pressure is high because I held his blood pressure medications lately. I recommend he restarts HCTZ but to continue to hold losartan due to hyperkalemia.

## 2014-12-03 NOTE — Telephone Encounter (Signed)
-----   Message from Rosalie Gums, RN sent at 12/03/2014  8:12 AM EDT ----- Regarding: new pt followup-Gorsuch First time patient R-[facility] on 11-29-14, reacted to Rituxan.

## 2014-12-03 NOTE — Telephone Encounter (Signed)
Per staff message I have scheduled appts. Advised scheduler of appts. JMW

## 2014-12-03 NOTE — Telephone Encounter (Signed)
Per staff message and POF I have scheduled appts. Advised scheduler of appts. Advised schedule that MD appt on 6/16 is longer than 3hrs, waiting on MD   JMW

## 2014-12-03 NOTE — Telephone Encounter (Signed)
Pt confirmed labs/ov per 05/31 POF, gave pt AVS and Calendar..... KJ, sent msg to add chemo and blood

## 2014-12-03 NOTE — Telephone Encounter (Signed)
Pt seen by Dr. Alvy Bimler today in office.  Pt denied any n/v/d or any fevers.  He denies any new symptoms or concerns except his only concern was the reaction he had to the Rituxan.  He will discuss w/ Dr. Alvy Bimler on visit.

## 2014-12-03 NOTE — Progress Notes (Signed)
[place] OFFICE PROGRESS NOTE  Patient Care Team: Harrison Mons, PA-C as PCP - General (Physician Assistant) Corliss Parish, MD as Consulting Physician (Nephrology)  SUMMARY OF ONCOLOGIC HISTORY: Oncology History   Lymphoma   Staging form: Lymphoid Neoplasms, AJCC 6th Edition     Clinical stage from 11/21/2014: Stage IV - Signed by Heath Lark, MD on 11/21/2014       Diffuse large B cell lymphoma   11/13/2014 Imaging PEt scan showed widespread disease including possible splenic involvement   11/19/2014 Surgery He has excisional LN biopsy and port placement   11/19/2014 Pathology Results Preliminary biopsy result showed DLBCL   11/29/2014 -  Chemotherapy He received cycle one of RCHOP chemotherapy complicated by severe infusion reaction.    INTERVAL HISTORY: Please see below for problem oriented charting. He returns for further follow-up. He had significant infusion reaction with cycle 1 of treatment. Most of his lymph nodes have disappeared. Overall, he tolerated treatment well. He complained of bilateral leg edema.  REVIEW OF SYSTEMS:   Constitutional: Denies fevers, chills or abnormal weight loss Eyes: Denies blurriness of vision Ears, nose, mouth, throat, and face: Denies mucositis or sore throat Respiratory: Denies cough, dyspnea or wheezes Cardiovascular: Denies palpitation, chest discomfort  Gastrointestinal:  Denies nausea, heartburn or change in bowel habits Skin: Denies abnormal skin rashes Lymphatics: Denies new lymphadenopathy or easy bruising Neurological:Denies numbness, tingling or new weaknesses Behavioral/Psych: Mood is stable, no new changes  All other systems were reviewed with the patient and are negative.  I have reviewed the past medical history, past surgical history, social history and family history with the patient and they are unchanged from previous note.  ALLERGIES:  is allergic to ace inhibitors and rituximab.  MEDICATIONS:   Current Outpatient Prescriptions  Medication Sig Dispense Refill  . acetaminophen (TYLENOL) 500 MG tablet Take 500 mg by mouth daily. X 5 days after chemo    . allopurinol (ZYLOPRIM) 100 MG tablet Take 1 tablet (100 mg total) by mouth daily. 30 tablet 0  . amLODipine (NORVASC) 10 MG tablet TAKE ONE TABLET BY MOUTH ONCE DAILY 30 tablet 5  . aspirin 81 MG tablet Take 81 mg by mouth daily.    . fish oil-omega-3 fatty acids 1000 MG capsule Take 1 g by mouth daily.    Marland Kitchen glipiZIDE (GLUCOTROL XL) 10 MG 24 hr tablet TAKE ONE TABLET BY MOUTH ONCE DAILY 30 tablet 5  . glucose blood (ACCU-CHEK AVIVA PLUS) test strip Use as instructed 100 each 4  . lidocaine-prilocaine (EMLA) cream Apply to affected area once (Patient taking differently: Apply 1 application topically as needed. Apply to affected area once) 30 g 3  . loratadine (CLARITIN) 10 MG tablet Take 10 mg by mouth daily. 5 days after chemo    . ondansetron (ZOFRAN) 8 MG tablet Take 1 tablet (8 mg total) by mouth 2 (two) times daily. Start the day after chemo for 3 days. Then as needed for nausea or vomiting. 30 tablet 1  . pravastatin (PRAVACHOL) 40 MG tablet TAKE ONE TABLET BY MOUTH ONCE DAILY 30 tablet 5  . predniSONE (DELTASONE) 20 MG tablet Take 3 tablets (60 mg total) by mouth daily. Take on days 1-5 of chemotherapy. 60 tablet 0  . prochlorperazine (COMPAZINE) 10 MG tablet Take 1 tablet (10 mg total) by mouth every 6 (six) hours as needed (Nausea or vomiting). 30 tablet 6  . sitaGLIPtin (JANUVIA) 50 MG tablet Take 1 tablet (50 mg total) by mouth daily.  30 tablet 5  . hydrochlorothiazide (HYDRODIURIL) 25 MG tablet TAKE ONE TABLET BY MOUTH ONCE DAILY (Patient not taking: Reported on 11/22/2014) 30 tablet 5  . HYDROcodone-acetaminophen (NORCO) 5-325 MG per tablet Take 1-2 tablets by mouth every 6 (six) hours as needed. (Patient not taking: Reported on 12/03/2014) 30 tablet 0  . losartan (COZAAR) 100 MG tablet Take 1 tablet (100 mg total) by mouth  daily. (Patient not taking: Reported on 11/22/2014) 30 tablet 5   No current facility-administered medications for this visit.    PHYSICAL EXAMINATION: ECOG PERFORMANCE STATUS: 0 - Asymptomatic  Filed Vitals:   12/03/14 1103  BP: 171/67  Pulse: 99  Temp: 98.1 F (36.7 C)  Resp: 18   Filed Weights   12/03/14 1103  Weight: 155 lb 12.8 oz (70.67 kg)    GENERAL:alert, no distress and comfortable SKIN: skin color, texture, turgor are normal, no rashes or significant lesions EYES: normal, Conjunctiva are pink and non-injected, sclera clear OROPHARYNX:no exudate, no erythema and lips, buccal mucosa, and tongue normal  NECK: supple, thyroid normal size, non-tender, without nodularity LYMPH:  no palpable lymphadenopathy in the cervical, axillary or inguinal. Most of his lymph nodes have disappeared LUNGS: clear to auscultation and percussion with normal breathing effort HEART: regular rate & rhythm and no murmurs with moderate bilateral lower extremity edema ABDOMEN:abdomen soft, non-tender and normal bowel sounds Musculoskeletal:no cyanosis of digits and no clubbing  NEURO: alert & oriented x 3 with fluent speech, no focal motor/sensory deficits  LABORATORY DATA:  I have reviewed the data as listed    Component Value Date/Time   NA 137 12/03/2014 1051   NA 127* 11/23/2014 0314   NA 139 07/17/2014   K 5.3* 12/03/2014 1051   K 4.4 11/23/2014 0314   CL 96* 11/23/2014 0314   CO2 21* 12/03/2014 1051   CO2 19* 11/22/2014 2357   GLUCOSE 365* 12/03/2014 1051   GLUCOSE 445* 11/23/2014 0314   BUN 49.4* 12/03/2014 1051   BUN 77* 11/23/2014 0314   BUN 26* 07/17/2014   CREATININE 1.7* 12/03/2014 1051   CREATININE 2.60* 11/23/2014 0314   CREATININE 2.30* 10/31/2014 1114   CREATININE 2.1* 07/17/2014   CALCIUM 8.0* 12/03/2014 1051   CALCIUM 8.4* 11/22/2014 2357   PROT 5.6* 12/03/2014 1051   PROT 6.4* 11/22/2014 2357   ALBUMIN 2.7* 12/03/2014 1051   ALBUMIN 2.8* 11/22/2014 2357    AST 29 12/03/2014 1051   AST 30 11/22/2014 2357   ALT 42 12/03/2014 1051   ALT 45 11/22/2014 2357   ALKPHOS 108 12/03/2014 1051   ALKPHOS 85 11/22/2014 2357   BILITOT 0.71 12/03/2014 1051   BILITOT 0.8 11/22/2014 2357   GFRNONAA 26* 11/22/2014 2357   GFRNONAA 33* 09/11/2013 0920   GFRAA 30* 11/22/2014 2357   GFRAA 38* 09/11/2013 0920    No results found for: SPEP, UPEP  Lab Results  Component Value Date   WBC 44.5* 12/03/2014   NEUTROABS 43.6* 12/03/2014   HGB 8.0* 12/03/2014   HCT 25.9* 12/03/2014   MCV 84.9 12/03/2014   PLT 145 12/03/2014      Chemistry      Component Value Date/Time   NA 137 12/03/2014 1051   NA 127* 11/23/2014 0314   NA 139 07/17/2014   K 5.3* 12/03/2014 1051   K 4.4 11/23/2014 0314   CL 96* 11/23/2014 0314   CO2 21* 12/03/2014 1051   CO2 19* 11/22/2014 2357   BUN 49.4* 12/03/2014 1051   BUN 77* 11/23/2014  0314   BUN 26* 07/17/2014   CREATININE 1.7* 12/03/2014 1051   CREATININE 2.60* 11/23/2014 0314   CREATININE 2.30* 10/31/2014 1114   CREATININE 2.1* 07/17/2014   GLU 140 07/17/2014      Component Value Date/Time   CALCIUM 8.0* 12/03/2014 1051   CALCIUM 8.4* 11/22/2014 2357   ALKPHOS 108 12/03/2014 1051   ALKPHOS 85 11/22/2014 2357   AST 29 12/03/2014 1051   AST 30 11/22/2014 2357   ALT 42 12/03/2014 1051   ALT 45 11/22/2014 2357   BILITOT 0.71 12/03/2014 1051   BILITOT 0.8 11/22/2014 2357      ASSESSMENT & PLAN:  Diffuse large B cell lymphoma He tolerated cycle one poorly, not unexpected due to large disease burden. Since then, he has significant regression of his lymphoma. I told the patient that the result indicated diffuse large B-cell lymphoma. Due to high risk of CNS disease, I recommend intrathecal chemotherapy given prophylactically. He agreed to proceed. I plan to do the procedure on 12/19/2014.   Anemia in neoplastic disease I suspect the cause of anemia I will likely combination of anemia of chronic disease and  possible bone marrow infiltration by lymphoma. I present time, he is not symptomatic. He can be observed and does not require blood transfusion.    Chronic kidney disease, stage III (moderate) He has chronic kidney disease and this is unrelated to the lymphoma. He did need not dose adjustment for his treatment.     Hypertensive renal disease His blood pressure is high because I held his blood pressure medications lately. I recommend he restarts HCTZ but to continue to hold losartan due to hyperkalemia.   DM (diabetes mellitus), type 2 with renal complications He has significant hyperglycemia recently due to prednisone therapy. His last dose of prednisone is today. He will continue insulin.    Orders Placed This Encounter  Procedures  . Uric Acid    Standing Status: Future     Number of Occurrences:      Standing Expiration Date: 01/07/2016  . Lactate dehydrogenase    Standing Status: Future     Number of Occurrences:      Standing Expiration Date: 01/07/2016  . Hold Tube, Blood Bank    Standing Status: Future     Number of Occurrences:      Standing Expiration Date: 01/07/2016   All questions were answered. The patient knows to call the clinic with any problems, questions or concerns. No barriers to learning was detected. I spent 25 minutes counseling the patient face to face. The total time spent in the appointment was 30 minutes and more than 50% was on counseling and review of test results     [facility], [place], MD 12/03/2014 5:02 PM

## 2014-12-04 ENCOUNTER — Ambulatory Visit (HOSPITAL_COMMUNITY)
Admission: RE | Admit: 2014-12-04 | Discharge: 2014-12-04 | Disposition: A | Discharge status: Home / Self Care | Payer: Medicare Other | Source: Ambulatory Visit | Attending: Hematology and Oncology | Admitting: Hematology and Oncology

## 2014-12-04 DIAGNOSIS — D630 Anemia in neoplastic disease: Secondary | ICD-10-CM

## 2014-12-04 DIAGNOSIS — C8330 Diffuse large B-cell lymphoma, unspecified site: Secondary | ICD-10-CM

## 2014-12-10 ENCOUNTER — Ambulatory Visit: Payer: Medicare Other

## 2014-12-10 ENCOUNTER — Telehealth: Payer: Self-pay | Admitting: Hematology and Oncology

## 2014-12-10 ENCOUNTER — Ambulatory Visit (HOSPITAL_BASED_OUTPATIENT_CLINIC_OR_DEPARTMENT_OTHER): Payer: Medicare Other | Admitting: Hematology and Oncology

## 2014-12-10 ENCOUNTER — Encounter: Payer: Self-pay | Admitting: Hematology and Oncology

## 2014-12-10 ENCOUNTER — Other Ambulatory Visit (HOSPITAL_BASED_OUTPATIENT_CLINIC_OR_DEPARTMENT_OTHER): Payer: Medicare Other

## 2014-12-10 ENCOUNTER — Ambulatory Visit (HOSPITAL_BASED_OUTPATIENT_CLINIC_OR_DEPARTMENT_OTHER): Payer: Medicare Other

## 2014-12-10 VITALS — BP 120/53 | HR 92 | Temp 98.0°F | Resp 18

## 2014-12-10 VITALS — BP 132/48 | HR 107 | Temp 98.0°F | Resp 18 | Ht 70.0 in | Wt 148.7 lb

## 2014-12-10 DIAGNOSIS — D630 Anemia in neoplastic disease: Secondary | ICD-10-CM

## 2014-12-10 DIAGNOSIS — C8330 Diffuse large B-cell lymphoma, unspecified site: Secondary | ICD-10-CM

## 2014-12-10 DIAGNOSIS — Z452 Encounter for adjustment and management of vascular access device: Secondary | ICD-10-CM | POA: Diagnosis not present

## 2014-12-10 DIAGNOSIS — N183 Chronic kidney disease, stage 3 unspecified: Secondary | ICD-10-CM

## 2014-12-10 DIAGNOSIS — Z95828 Presence of other vascular implants and grafts: Secondary | ICD-10-CM

## 2014-12-10 DIAGNOSIS — C8590 Non-Hodgkin lymphoma, unspecified, unspecified site: Secondary | ICD-10-CM

## 2014-12-10 DIAGNOSIS — T50905A Adverse effect of unspecified drugs, medicaments and biological substances, initial encounter: Secondary | ICD-10-CM

## 2014-12-10 DIAGNOSIS — D6959 Other secondary thrombocytopenia: Secondary | ICD-10-CM

## 2014-12-10 LAB — CBC WITH DIFFERENTIAL/PLATELET
BASO%: 0.3 % (ref 0.0–2.0)
Basophils Absolute: 0 10*3/uL (ref 0.0–0.1)
EOS%: 0.3 % (ref 0.0–7.0)
Eosinophils Absolute: 0 10*3/uL (ref 0.0–0.5)
HEMATOCRIT: 19.3 % — AB (ref 38.4–49.9)
HGB: 6.3 g/dL — CL (ref 13.0–17.1)
LYMPH%: 8.9 % — AB (ref 14.0–49.0)
MCH: 26.3 pg — AB (ref 27.2–33.4)
MCHC: 32.6 g/dL (ref 32.0–36.0)
MCV: 80.4 fL (ref 79.3–98.0)
MONO#: 0.6 10*3/uL (ref 0.1–0.9)
MONO%: 7.9 % (ref 0.0–14.0)
NEUT#: 6 10*3/uL (ref 1.5–6.5)
NEUT%: 82.6 % — ABNORMAL HIGH (ref 39.0–75.0)
NRBC: 0 % (ref 0–0)
Platelets: 97 10*3/uL — ABNORMAL LOW (ref 140–400)
RBC: 2.4 10*6/uL — ABNORMAL LOW (ref 4.20–5.82)
RDW: 17.3 % — ABNORMAL HIGH (ref 11.0–14.6)
WBC: 7.2 10*3/uL (ref 4.0–10.3)
lymph#: 0.6 10*3/uL — ABNORMAL LOW (ref 0.9–3.3)

## 2014-12-10 LAB — COMPREHENSIVE METABOLIC PANEL (CC13)
ALBUMIN: 2.2 g/dL — AB (ref 3.5–5.0)
ALT: 42 U/L (ref 0–55)
ANION GAP: 8 meq/L (ref 3–11)
AST: 17 U/L (ref 5–34)
Alkaline Phosphatase: 83 U/L (ref 40–150)
BILIRUBIN TOTAL: 0.42 mg/dL (ref 0.20–1.20)
BUN: 14 mg/dL (ref 7.0–26.0)
CO2: 25 meq/L (ref 22–29)
Calcium: 8.4 mg/dL (ref 8.4–10.4)
Chloride: 100 mEq/L (ref 98–109)
Creatinine: 1.4 mg/dL — ABNORMAL HIGH (ref 0.7–1.3)
EGFR: 62 mL/min/{1.73_m2} — ABNORMAL LOW (ref >90)
GLUCOSE: 247 mg/dL — AB (ref 70–140)
POTASSIUM: 3.8 meq/L (ref 3.5–5.1)
Sodium: 133 mEq/L — ABNORMAL LOW (ref 136–145)
Total Protein: 5.1 g/dL — ABNORMAL LOW (ref 6.4–8.3)

## 2014-12-10 LAB — ABO/RH: ABO/RH(D): O POS

## 2014-12-10 LAB — URIC ACID (CC13): Uric Acid, Serum: 4.2 mg/dl (ref 2.6–7.4)

## 2014-12-10 LAB — LACTATE DEHYDROGENASE (CC13): LDH: 275 U/L — AB (ref 125–245)

## 2014-12-10 LAB — HOLD TUBE, BLOOD BANK

## 2014-12-10 MED ORDER — SODIUM CHLORIDE 0.9 % IJ SOLN
10.0000 mL | INTRAMUSCULAR | Status: DC | PRN
  Administered 2014-12-10: 10 mL via INTRAVENOUS
  Filled 2014-12-10: qty 10

## 2014-12-10 MED ORDER — HEPARIN SOD (PORK) LOCK FLUSH 100 UNIT/ML IV SOLN
500.0000 [IU] | Freq: Once | INTRAVENOUS | Status: DC
  Filled 2014-12-10: qty 5

## 2014-12-10 MED ORDER — SODIUM CHLORIDE 0.9 % IV SOLN
250.0000 mL | Freq: Once | INTRAVENOUS | Status: AC
  Administered 2014-12-10: 250 mL via INTRAVENOUS

## 2014-12-10 MED ORDER — ACETAMINOPHEN 325 MG PO TABS
650.0000 mg | ORAL_TABLET | Freq: Once | ORAL | Status: AC
Start: 2014-12-10 — End: 2014-12-10
  Administered 2014-12-10: 650 mg via ORAL

## 2014-12-10 MED ORDER — SODIUM CHLORIDE 0.9 % IJ SOLN
10.0000 mL | INTRAMUSCULAR | Status: AC | PRN
  Administered 2014-12-10: 10 mL
  Filled 2014-12-10: qty 10

## 2014-12-10 MED ORDER — HEPARIN SOD (PORK) LOCK FLUSH 100 UNIT/ML IV SOLN
500.0000 [IU] | Freq: Every day | INTRAVENOUS | Status: AC | PRN
  Administered 2014-12-10: 500 [IU]
  Filled 2014-12-10: qty 5

## 2014-12-10 MED ORDER — ACETAMINOPHEN 325 MG PO TABS
ORAL_TABLET | ORAL | Status: AC
  Filled 2014-12-10: qty 2

## 2014-12-10 NOTE — Patient Instructions (Signed)

## 2014-12-10 NOTE — Patient Instructions (Signed)

## 2014-12-10 NOTE — Telephone Encounter (Signed)
Gave and printed appt sched and avs for pt for June °

## 2014-12-11 ENCOUNTER — Encounter: Payer: Self-pay | Admitting: Hematology and Oncology

## 2014-12-11 DIAGNOSIS — T50905A Adverse effect of unspecified drugs, medicaments and biological substances, initial encounter: Secondary | ICD-10-CM

## 2014-12-11 DIAGNOSIS — D6959 Other secondary thrombocytopenia: Secondary | ICD-10-CM | POA: Insufficient documentation

## 2014-12-11 LAB — TYPE AND SCREEN
ABO/RH(D): O POS
ANTIBODY SCREEN: NEGATIVE
UNIT DIVISION: 0

## 2014-12-11 NOTE — Assessment & Plan Note (Signed)
He has chronic kidney disease and this is unrelated to the lymphoma. He did need not dose adjustment for his treatment.

## 2014-12-11 NOTE — Assessment & Plan Note (Signed)
This is likely due to recent treatment. The patient denies recent history of bleeding such as epistaxis, hematuria or hematochezia. He is asymptomatic from the low platelet count. I will observe for now.  

## 2014-12-11 NOTE — Progress Notes (Signed)
[place] OFFICE PROGRESS NOTE  Patient Care Team: Harrison Mons, PA-C as PCP - General (Physician Assistant) Corliss Parish, MD as Consulting Physician (Nephrology) Fanny Skates, MD as Consulting Physician (General Surgery) Heath Lark, MD as Consulting Physician (Hematology and Oncology)  SUMMARY OF ONCOLOGIC HISTORY: Oncology History   Lymphoma   Staging form: Lymphoid Neoplasms, AJCC 6th Edition     Clinical stage from 11/21/2014: Stage IV - Signed by Heath Lark, MD on 11/21/2014       Diffuse large B cell lymphoma   11/13/2014 Imaging PEt scan showed widespread disease including possible splenic involvement   11/19/2014 Surgery He has excisional LN biopsy and port placement   11/19/2014 Pathology Results Preliminary biopsy result showed DLBCL   11/29/2014 -  Chemotherapy He received cycle one of RCHOP chemotherapy complicated by severe infusion reaction.    INTERVAL HISTORY: Please see below for problem oriented charting. He feels tired.  The patient denies any recent signs or symptoms of bleeding such as spontaneous epistaxis, hematuria or hematochezia. Denies new lymphadenopathy REVIEW OF SYSTEMS:   Constitutional: Denies fevers, chills or abnormal weight loss Eyes: Denies blurriness of vision Ears, nose, mouth, throat, and face: Denies mucositis or sore throat Respiratory: Denies cough, dyspnea or wheezes Cardiovascular: Denies palpitation, chest discomfort or lower extremity swelling Gastrointestinal:  Denies nausea, heartburn or change in bowel habits Skin: Denies abnormal skin rashes Lymphatics: Denies new lymphadenopathy or easy bruising Neurological:Denies numbness, tingling or new weaknesses Behavioral/Psych: Mood is stable, no new changes  All other systems were reviewed with the patient and are negative.  I have reviewed the past medical history, past surgical history, social history and family history with the patient and they are unchanged  from previous note.  ALLERGIES:  is allergic to ace inhibitors and rituximab.  MEDICATIONS:  Current Outpatient Prescriptions  Medication Sig Dispense Refill  . acetaminophen (TYLENOL) 500 MG tablet Take 500 mg by mouth daily. X 5 days after chemo    . allopurinol (ZYLOPRIM) 100 MG tablet Take 1 tablet (100 mg total) by mouth daily. 30 tablet 0  . amLODipine (NORVASC) 10 MG tablet TAKE ONE TABLET BY MOUTH ONCE DAILY 30 tablet 5  . aspirin 81 MG tablet Take 81 mg by mouth daily.    . fish oil-omega-3 fatty acids 1000 MG capsule Take 1 g by mouth daily.    Marland Kitchen glipiZIDE (GLUCOTROL XL) 10 MG 24 hr tablet TAKE ONE TABLET BY MOUTH ONCE DAILY 30 tablet 5  . glucose blood (ACCU-CHEK AVIVA PLUS) test strip Use as instructed 100 each 4  . hydrochlorothiazide (HYDRODIURIL) 25 MG tablet TAKE ONE TABLET BY MOUTH ONCE DAILY 30 tablet 5  . HYDROcodone-acetaminophen (NORCO) 5-325 MG per tablet Take 1-2 tablets by mouth every 6 (six) hours as needed. 30 tablet 0  . lidocaine-prilocaine (EMLA) cream Apply to affected area once (Patient taking differently: Apply 1 application topically as needed. Apply to affected area once) 30 g 3  . loratadine (CLARITIN) 10 MG tablet Take 10 mg by mouth daily. 5 days after chemo    . losartan (COZAAR) 100 MG tablet Take 1 tablet (100 mg total) by mouth daily. 30 tablet 5  . ondansetron (ZOFRAN) 8 MG tablet Take 1 tablet (8 mg total) by mouth 2 (two) times daily. Start the day after chemo for 3 days. Then as needed for nausea or vomiting. 30 tablet 1  . pravastatin (PRAVACHOL) 40 MG tablet TAKE ONE TABLET BY MOUTH ONCE DAILY 30 tablet 5  .  predniSONE (DELTASONE) 20 MG tablet Take 3 tablets (60 mg total) by mouth daily. Take on days 1-5 of chemotherapy. 60 tablet 0  . prochlorperazine (COMPAZINE) 10 MG tablet Take 1 tablet (10 mg total) by mouth every 6 (six) hours as needed (Nausea or vomiting). 30 tablet 6  . sitaGLIPtin (JANUVIA) 50 MG tablet Take 1 tablet (50 mg total) by  mouth daily. 30 tablet 5   No current facility-administered medications for this visit.    PHYSICAL EXAMINATION: ECOG PERFORMANCE STATUS: 1 - Symptomatic but completely ambulatory  Filed Vitals:   12/10/14 0858  BP: 132/48  Pulse: 107  Temp: 98 F (36.7 C)  Resp: 18   Filed Weights   12/10/14 0858  Weight: 148 lb 11.2 oz (67.45 kg)    GENERAL:alert, no distress and comfortable SKIN: skin color, texture, turgor are normal, no rashes or significant lesions EYES: normal, Conjunctiva are pink and non-injected, sclera clear OROPHARYNX:no exudate, no erythema and lips, buccal mucosa, and tongue normal  NECK: supple, thyroid normal size, non-tender, without nodularity LYMPH:  no palpable lymphadenopathy in the cervical, axillary or inguinal LUNGS: clear to auscultation and percussion with normal breathing effort HEART: regular rate & rhythm and no murmurs and no lower extremity edema ABDOMEN:abdomen soft, non-tender and normal bowel sounds Musculoskeletal:no cyanosis of digits and no clubbing  NEURO: alert & oriented x 3 with fluent speech, no focal motor/sensory deficits  LABORATORY DATA:  I have reviewed the data as listed    Component Value Date/Time   NA 133* 12/10/2014 0822   NA 127* 11/23/2014 0314   NA 139 07/17/2014   K 3.8 12/10/2014 0822   K 4.4 11/23/2014 0314   CL 96* 11/23/2014 0314   CO2 25 12/10/2014 0822   CO2 19* 11/22/2014 2357   GLUCOSE 247* 12/10/2014 0822   GLUCOSE 445* 11/23/2014 0314   BUN 14.0 12/10/2014 0822   BUN 77* 11/23/2014 0314   BUN 26* 07/17/2014   CREATININE 1.4* 12/10/2014 0822   CREATININE 2.60* 11/23/2014 0314   CREATININE 2.30* 10/31/2014 1114   CREATININE 2.1* 07/17/2014   CALCIUM 8.4 12/10/2014 0822   CALCIUM 8.4* 11/22/2014 2357   PROT 5.1* 12/10/2014 0822   PROT 6.4* 11/22/2014 2357   ALBUMIN 2.2* 12/10/2014 0822   ALBUMIN 2.8* 11/22/2014 2357   AST 17 12/10/2014 0822   AST 30 11/22/2014 2357   ALT 42 12/10/2014 0822    ALT 45 11/22/2014 2357   ALKPHOS 83 12/10/2014 0822   ALKPHOS 85 11/22/2014 2357   BILITOT 0.42 12/10/2014 0822   BILITOT 0.8 11/22/2014 2357   GFRNONAA 26* 11/22/2014 2357   GFRNONAA 33* 09/11/2013 0920   GFRAA 30* 11/22/2014 2357   GFRAA 38* 09/11/2013 0920    No results found for: SPEP, UPEP  Lab Results  Component Value Date   WBC 7.2 12/10/2014   NEUTROABS 6.0 12/10/2014   HGB 6.3* 12/10/2014   HCT 19.3* 12/10/2014   MCV 80.4 12/10/2014   PLT 97* 12/10/2014      Chemistry      Component Value Date/Time   NA 133* 12/10/2014 0822   NA 127* 11/23/2014 0314   NA 139 07/17/2014   K 3.8 12/10/2014 0822   K 4.4 11/23/2014 0314   CL 96* 11/23/2014 0314   CO2 25 12/10/2014 0822   CO2 19* 11/22/2014 2357   BUN 14.0 12/10/2014 0822   BUN 77* 11/23/2014 0314   BUN 26* 07/17/2014   CREATININE 1.4* 12/10/2014 0822   CREATININE  2.60* 11/23/2014 0314   CREATININE 2.30* 10/31/2014 1114   CREATININE 2.1* 07/17/2014   GLU 140 07/17/2014      Component Value Date/Time   CALCIUM 8.4 12/10/2014 0822   CALCIUM 8.4* 11/22/2014 2357   ALKPHOS 83 12/10/2014 0822   ALKPHOS 85 11/22/2014 2357   AST 17 12/10/2014 0822   AST 30 11/22/2014 2357   ALT 42 12/10/2014 0822   ALT 45 11/22/2014 2357   BILITOT 0.42 12/10/2014 0822   BILITOT 0.8 11/22/2014 2357     ASSESSMENT & PLAN:  Diffuse large B cell lymphoma He tolerated cycle one poorly, not unexpected due to large disease burden. Since then, he has significant regression of his lymphoma. Due to high risk of CNS disease, I recommend intrathecal chemotherapy given prophylactically. He agreed to proceed. I plan to do the procedure on 12/19/2014.   Anemia in neoplastic disease We discussed some of the risks, benefits, and alternatives of blood transfusions. The patient is symptomatic from anemia and the hemoglobin level is critically low.  Some of the side-effects to be expected including risks of transfusion reactions, chills,  infection, syndrome of volume overload and risk of hospitalization from various reasons and the patient is willing to proceed and went ahead to sign consent today.   Chronic kidney disease, stage III (moderate) He has chronic kidney disease and this is unrelated to the lymphoma. He did need not dose adjustment for his treatment.   Thrombocytopenia due to drugs This is likely due to recent treatment. The patient denies recent history of bleeding such as epistaxis, hematuria or hematochezia. He is asymptomatic from the low platelet count. I will observe for now.    Orders Placed This Encounter  Procedures  . Lactate dehydrogenase    Standing Status: Future     Number of Occurrences:      Standing Expiration Date: 01/14/2016  . Uric Acid    Standing Status: Future     Number of Occurrences:      Standing Expiration Date: 01/14/2016  . Hold Tube, Blood Bank    Standing Status: Future     Number of Occurrences:      Standing Expiration Date: 01/14/2016   All questions were answered. The patient knows to call the clinic with any problems, questions or concerns. No barriers to learning was detected. I spent 25 minutes counseling the patient face to face. The total time spent in the appointment was 30 minutes and more than 50% was on counseling and review of test results     [facility], [place], MD 12/11/2014 9:26 PM

## 2014-12-11 NOTE — Assessment & Plan Note (Signed)
He tolerated cycle one poorly, not unexpected due to large disease burden. Since then, he has significant regression of his lymphoma. Due to high risk of CNS disease, I recommend intrathecal chemotherapy given prophylactically. He agreed to proceed. I plan to do the procedure on 12/19/2014.

## 2014-12-11 NOTE — Assessment & Plan Note (Signed)
We discussed some of the risks, benefits, and alternatives of blood transfusions. The patient is symptomatic from anemia and the hemoglobin level is critically low.  Some of the side-effects to be expected including risks of transfusion reactions, chills, infection, syndrome of volume overload and risk of hospitalization from various reasons and the patient is willing to proceed and went ahead to sign consent today.

## 2014-12-19 ENCOUNTER — Encounter: Payer: Self-pay | Admitting: Hematology and Oncology

## 2014-12-19 ENCOUNTER — Ambulatory Visit (HOSPITAL_BASED_OUTPATIENT_CLINIC_OR_DEPARTMENT_OTHER): Payer: Medicare Other | Admitting: Hematology and Oncology

## 2014-12-19 ENCOUNTER — Other Ambulatory Visit: Payer: Self-pay | Admitting: Hematology and Oncology

## 2014-12-19 ENCOUNTER — Ambulatory Visit: Payer: Medicare Other

## 2014-12-19 ENCOUNTER — Other Ambulatory Visit (HOSPITAL_BASED_OUTPATIENT_CLINIC_OR_DEPARTMENT_OTHER): Payer: Medicare Other

## 2014-12-19 ENCOUNTER — Ambulatory Visit (HOSPITAL_BASED_OUTPATIENT_CLINIC_OR_DEPARTMENT_OTHER): Payer: Medicare Other

## 2014-12-19 VITALS — BP 140/61 | HR 90 | Temp 98.4°F | Resp 18

## 2014-12-19 VITALS — BP 131/67 | HR 82 | Temp 98.0°F | Resp 18

## 2014-12-19 DIAGNOSIS — N183 Chronic kidney disease, stage 3 unspecified: Secondary | ICD-10-CM

## 2014-12-19 DIAGNOSIS — Z95828 Presence of other vascular implants and grafts: Secondary | ICD-10-CM

## 2014-12-19 DIAGNOSIS — E1169 Type 2 diabetes mellitus with other specified complication: Secondary | ICD-10-CM

## 2014-12-19 DIAGNOSIS — E1122 Type 2 diabetes mellitus with diabetic chronic kidney disease: Secondary | ICD-10-CM

## 2014-12-19 DIAGNOSIS — D630 Anemia in neoplastic disease: Secondary | ICD-10-CM

## 2014-12-19 DIAGNOSIS — Z5111 Encounter for antineoplastic chemotherapy: Secondary | ICD-10-CM | POA: Diagnosis not present

## 2014-12-19 DIAGNOSIS — C8330 Diffuse large B-cell lymphoma, unspecified site: Secondary | ICD-10-CM

## 2014-12-19 DIAGNOSIS — D72829 Elevated white blood cell count, unspecified: Secondary | ICD-10-CM | POA: Diagnosis not present

## 2014-12-19 DIAGNOSIS — E1129 Type 2 diabetes mellitus with other diabetic kidney complication: Secondary | ICD-10-CM

## 2014-12-19 DIAGNOSIS — C8590 Non-Hodgkin lymphoma, unspecified, unspecified site: Secondary | ICD-10-CM

## 2014-12-19 LAB — CBC WITH DIFFERENTIAL/PLATELET
BASO%: 0.5 % (ref 0.0–2.0)
Basophils Absolute: 0.2 10*3/uL — ABNORMAL HIGH (ref 0.0–0.1)
EOS ABS: 0 10*3/uL (ref 0.0–0.5)
EOS%: 0 % (ref 0.0–7.0)
HCT: 27.6 % — ABNORMAL LOW (ref 38.4–49.9)
HGB: 9.5 g/dL — ABNORMAL LOW (ref 13.0–17.1)
LYMPH%: 2.6 % — ABNORMAL LOW (ref 14.0–49.0)
MCH: 28.2 pg (ref 27.2–33.4)
MCHC: 34.4 g/dL (ref 32.0–36.0)
MCV: 81.9 fL (ref 79.3–98.0)
MONO#: 1.3 10*3/uL — AB (ref 0.1–0.9)
MONO%: 3 % (ref 0.0–14.0)
NEUT%: 93.9 % — ABNORMAL HIGH (ref 39.0–75.0)
NEUTROS ABS: 40.7 10*3/uL — AB (ref 1.5–6.5)
Platelets: 500 10*3/uL — ABNORMAL HIGH (ref 140–400)
RBC: 3.37 10*6/uL — AB (ref 4.20–5.82)
RDW: 17.3 % — AB (ref 11.0–14.6)
WBC: 43.4 10*3/uL — AB (ref 4.0–10.3)
lymph#: 1.1 10*3/uL (ref 0.9–3.3)
nRBC: 1 % — ABNORMAL HIGH (ref 0–0)

## 2014-12-19 LAB — COMPREHENSIVE METABOLIC PANEL (CC13)
ALK PHOS: 171 U/L — AB (ref 40–150)
ALT: 31 U/L (ref 0–55)
AST: 14 U/L (ref 5–34)
Albumin: 2.7 g/dL — ABNORMAL LOW (ref 3.5–5.0)
Anion Gap: 7 mEq/L (ref 3–11)
BILIRUBIN TOTAL: 0.38 mg/dL (ref 0.20–1.20)
BUN: 36.3 mg/dL — ABNORMAL HIGH (ref 7.0–26.0)
CO2: 28 mEq/L (ref 22–29)
Calcium: 9 mg/dL (ref 8.4–10.4)
Chloride: 95 mEq/L — ABNORMAL LOW (ref 98–109)
Creatinine: 1.8 mg/dL — ABNORMAL HIGH (ref 0.7–1.3)
EGFR: 46 mL/min/{1.73_m2} — ABNORMAL LOW (ref >90)
GLUCOSE: 553 mg/dL — AB (ref 70–140)
Potassium: 4.8 mEq/L (ref 3.5–5.1)
SODIUM: 130 meq/L — AB (ref 136–145)
TOTAL PROTEIN: 6.3 g/dL — AB (ref 6.4–8.3)

## 2014-12-19 LAB — URIC ACID (CC13): URIC ACID, SERUM: 5.2 mg/dL (ref 2.6–7.4)

## 2014-12-19 LAB — WHOLE BLOOD GLUCOSE
Glucose: 529 mg/dL — ABNORMAL HIGH (ref 70–100)
HRS PC: 1 h

## 2014-12-19 LAB — HOLD TUBE, BLOOD BANK

## 2014-12-19 LAB — LACTATE DEHYDROGENASE (CC13): LDH: 432 U/L — ABNORMAL HIGH (ref 125–245)

## 2014-12-19 MED ORDER — SODIUM CHLORIDE 0.9 % IJ SOLN
10.0000 mL | INTRAMUSCULAR | Status: DC | PRN
  Administered 2014-12-19: 10 mL via INTRAVENOUS
  Filled 2014-12-19: qty 10

## 2014-12-19 MED ORDER — PROMETHAZINE HCL 25 MG/ML IJ SOLN
25.0000 mg | Freq: Once | INTRAMUSCULAR | Status: DC
  Filled 2014-12-19: qty 1

## 2014-12-19 MED ORDER — INSULIN REGULAR HUMAN 100 UNIT/ML IJ SOLN
10.0000 [IU] | Freq: Once | INTRAMUSCULAR | Status: AC
  Administered 2014-12-19: 10 [IU] via SUBCUTANEOUS
  Filled 2014-12-19: qty 0.1

## 2014-12-19 MED ORDER — HEPARIN SOD (PORK) LOCK FLUSH 100 UNIT/ML IV SOLN
500.0000 [IU] | Freq: Once | INTRAVENOUS | Status: AC | PRN
  Administered 2014-12-19: 500 [IU]
  Filled 2014-12-19: qty 5

## 2014-12-19 MED ORDER — SODIUM CHLORIDE 0.9 % IV SOLN
Freq: Once | INTRAVENOUS | Status: AC
  Administered 2014-12-19: 14:00:00 via INTRAVENOUS

## 2014-12-19 MED ORDER — SODIUM CHLORIDE 0.9 % IJ SOLN
10.0000 mL | INTRAMUSCULAR | Status: DC | PRN
Start: 2014-12-19 — End: 2014-12-19
  Administered 2014-12-19: 10 mL
  Filled 2014-12-19: qty 10

## 2014-12-19 MED ORDER — SODIUM CHLORIDE 0.9 % IJ SOLN
Freq: Once | INTRAMUSCULAR | Status: AC
  Administered 2014-12-19: 15:00:00 via INTRATHECAL
  Filled 2014-12-19: qty 0.48

## 2014-12-19 MED ORDER — INSULIN REGULAR HUMAN 100 UNIT/ML IJ SOLN
15.0000 [IU] | Freq: Once | INTRAMUSCULAR | Status: AC
  Administered 2014-12-19: 15 [IU] via SUBCUTANEOUS
  Filled 2014-12-19: qty 0.15

## 2014-12-19 NOTE — Assessment & Plan Note (Signed)
I suspect the cause of anemia I will likely combination of anemia of chronic disease and possible bone marrow infiltration by lymphoma. I present time, he is not symptomatic. He can be observed and does not require blood transfusion.

## 2014-12-19 NOTE — Assessment & Plan Note (Signed)
Clinically, he had excellent response to treatment. Per prior discussion, I will proceed with prophylactic IT administration to reduce risk of CNS relapse. He is started on  high dose prednisone today as part of his chemotherapy and also with the hope to prevent allergic reaction with R-CHOP chemotherapy tomorrow. I will see him back in 3 weeks with blood work, history, physical examination, chemotherapy and IT administration.

## 2014-12-19 NOTE — Assessment & Plan Note (Signed)
This is reactive in nature related to recent steroid treatment. He has no signs of infection. I will observe only.

## 2014-12-19 NOTE — Progress Notes (Signed)
[facility] OFFICE PROGRESS NOTE  Patient Care Team: Harrison Mons, PA-C as PCP - General (Physician Assistant) Corliss Parish, MD as Consulting Physician (Nephrology) Fanny Skates, MD as Consulting Physician (General Surgery) Heath Lark, MD as Consulting Physician (Hematology and Oncology)  SUMMARY OF ONCOLOGIC HISTORY: Oncology History   Lymphoma   Staging form: Lymphoid Neoplasms, AJCC 6th Edition     Clinical stage from 11/21/2014: Stage IV - Signed by Heath Lark, MD on 11/21/2014       Diffuse large B cell lymphoma   11/13/2014 Imaging PEt scan showed widespread disease including possible splenic involvement   11/19/2014 Surgery He has excisional LN biopsy and port placement   11/19/2014 Pathology Results Biopsy of lymph nodes showed DLBCL   11/29/2014 -  Chemotherapy He received cycle one of RCHOP chemotherapy complicated by severe infusion reaction.   12/19/2014 -  Chemotherapy He received IT chemotherapy.    INTERVAL HISTORY: Please see below for problem oriented charting. He is seen prior to cycle 2 of treatment. He feels well. Denies new lymphadenopathy. No recent fevers or chills.  REVIEW OF SYSTEMS:   Constitutional: Denies fevers, chills or abnormal weight loss Eyes: Denies blurriness of vision Ears, nose, mouth, throat, and face: Denies mucositis or sore throat Respiratory: Denies cough, dyspnea or wheezes Cardiovascular: Denies palpitation, chest discomfort or lower extremity swelling Gastrointestinal:  Denies nausea, heartburn or change in bowel habits Skin: Denies abnormal skin rashes Lymphatics: Denies new lymphadenopathy or easy bruising Neurological:Denies numbness, tingling or new weaknesses Behavioral/Psych: Mood is stable, no new changes  All other systems were reviewed with the patient and are negative.  I have reviewed the past medical history, past surgical history, social history and family history with the patient and they are  unchanged from previous note.  ALLERGIES:  is allergic to ace inhibitors and rituximab.  MEDICATIONS:  Current Outpatient Prescriptions  Medication Sig Dispense Refill  . acetaminophen (TYLENOL) 500 MG tablet Take 500 mg by mouth daily. X 5 days after chemo    . allopurinol (ZYLOPRIM) 100 MG tablet Take 1 tablet (100 mg total) by mouth daily. 30 tablet 0  . amLODipine (NORVASC) 10 MG tablet TAKE ONE TABLET BY MOUTH ONCE DAILY 30 tablet 5  . aspirin 81 MG tablet Take 81 mg by mouth daily.    . fish oil-omega-3 fatty acids 1000 MG capsule Take 1 g by mouth daily.    Marland Kitchen glipiZIDE (GLUCOTROL XL) 10 MG 24 hr tablet TAKE ONE TABLET BY MOUTH ONCE DAILY 30 tablet 5  . glucose blood (ACCU-CHEK AVIVA PLUS) test strip Use as instructed 100 each 4  . hydrochlorothiazide (HYDRODIURIL) 25 MG tablet TAKE ONE TABLET BY MOUTH ONCE DAILY 30 tablet 5  . HYDROcodone-acetaminophen (NORCO) 5-325 MG per tablet Take 1-2 tablets by mouth every 6 (six) hours as needed. 30 tablet 0  . lidocaine-prilocaine (EMLA) cream Apply to affected area once (Patient taking differently: Apply 1 application topically as needed. Apply to affected area once) 30 g 3  . loratadine (CLARITIN) 10 MG tablet Take 10 mg by mouth daily. 5 days after chemo    . losartan (COZAAR) 100 MG tablet Take 1 tablet (100 mg total) by mouth daily. 30 tablet 5  . ondansetron (ZOFRAN) 8 MG tablet Take 1 tablet (8 mg total) by mouth 2 (two) times daily. Start the day after chemo for 3 days. Then as needed for nausea or vomiting. 30 tablet 1  . pravastatin (PRAVACHOL) 40 MG tablet TAKE ONE  TABLET BY MOUTH ONCE DAILY 30 tablet 5  . predniSONE (DELTASONE) 20 MG tablet Take 3 tablets (60 mg total) by mouth daily. Take on days 1-5 of chemotherapy. 60 tablet 0  . prochlorperazine (COMPAZINE) 10 MG tablet Take 1 tablet (10 mg total) by mouth every 6 (six) hours as needed (Nausea or vomiting). 30 tablet 6  . sitaGLIPtin (JANUVIA) 50 MG tablet Take 1 tablet (50 mg  total) by mouth daily. 30 tablet 5   No current facility-administered medications for this visit.   Facility-Administered Medications Ordered in Other Visits  Medication Dose Route Frequency Provider Last Rate Last Dose  . promethazine (PHENERGAN) injection 25 mg  25 mg Intravenous Once Heath Lark, MD   25 mg at 12/19/14 1549  . sodium chloride 0.9 % injection 10 mL  10 mL Intracatheter PRN Heath Lark, MD   10 mL at 12/19/14 1520    PHYSICAL EXAMINATION: ECOG PERFORMANCE STATUS: 0 - Asymptomatic  Filed Vitals:   12/19/14 1422  BP: 140/61  Pulse: 90  Temp: 98.4 F (36.9 C)  Resp: 18   There were no vitals filed for this visit.  GENERAL:alert, no distress and comfortable SKIN: skin color, texture, turgor are normal, no rashes or significant lesions EYES: normal, Conjunctiva are pink and non-injected, sclera clear OROPHARYNX:no exudate, no erythema and lips, buccal mucosa, and tongue normal  NECK: supple, thyroid normal size, non-tender, without nodularity LYMPH:  no palpable lymphadenopathy in the cervical, axillary or inguinal LUNGS: clear to auscultation and percussion with normal breathing effort HEART: regular rate & rhythm and no murmurs and no lower extremity edema ABDOMEN:abdomen soft, non-tender and normal bowel sounds Musculoskeletal:no cyanosis of digits and no clubbing  NEURO: alert & oriented x 3 with fluent speech, no focal motor/sensory deficits  LABORATORY DATA:  I have reviewed the data as listed    Component Value Date/Time   NA 130* 12/19/2014 1221   NA 127* 11/23/2014 0314   NA 139 07/17/2014   K 4.8 12/19/2014 1221   K 4.4 11/23/2014 0314   CL 96* 11/23/2014 0314   CO2 28 12/19/2014 1221   CO2 19* 11/22/2014 2357   GLUCOSE 553* 12/19/2014 1221   GLUCOSE 445* 11/23/2014 0314   BUN 36.3* 12/19/2014 1221   BUN 77* 11/23/2014 0314   BUN 26* 07/17/2014   CREATININE 1.8* 12/19/2014 1221   CREATININE 2.60* 11/23/2014 0314   CREATININE 2.30* 10/31/2014  1114   CREATININE 2.1* 07/17/2014   CALCIUM 9.0 12/19/2014 1221   CALCIUM 8.4* 11/22/2014 2357   PROT 6.3* 12/19/2014 1221   PROT 6.4* 11/22/2014 2357   ALBUMIN 2.7* 12/19/2014 1221   ALBUMIN 2.8* 11/22/2014 2357   AST 14 12/19/2014 1221   AST 30 11/22/2014 2357   ALT 31 12/19/2014 1221   ALT 45 11/22/2014 2357   ALKPHOS 171* 12/19/2014 1221   ALKPHOS 85 11/22/2014 2357   BILITOT 0.38 12/19/2014 1221   BILITOT 0.8 11/22/2014 2357   GFRNONAA 26* 11/22/2014 2357   GFRNONAA 33* 09/11/2013 0920   GFRAA 30* 11/22/2014 2357   GFRAA 38* 09/11/2013 0920    No results found for: SPEP, UPEP  Lab Results  Component Value Date   WBC 43.4* 12/19/2014   NEUTROABS 40.7* 12/19/2014   HGB 9.5* 12/19/2014   HCT 27.6* 12/19/2014   MCV 81.9 12/19/2014   PLT 500* 12/19/2014      Chemistry      Component Value Date/Time   NA 130* 12/19/2014 1221   NA 127*  11/23/2014 0314   NA 139 07/17/2014   K 4.8 12/19/2014 1221   K 4.4 11/23/2014 0314   CL 96* 11/23/2014 0314   CO2 28 12/19/2014 1221   CO2 19* 11/22/2014 2357   BUN 36.3* 12/19/2014 1221   BUN 77* 11/23/2014 0314   BUN 26* 07/17/2014   CREATININE 1.8* 12/19/2014 1221   CREATININE 2.60* 11/23/2014 0314   CREATININE 2.30* 10/31/2014 1114   CREATININE 2.1* 07/17/2014   GLU 529* 12/19/2014 1514      Component Value Date/Time   CALCIUM 9.0 12/19/2014 1221   CALCIUM 8.4* 11/22/2014 2357   ALKPHOS 171* 12/19/2014 1221   ALKPHOS 85 11/22/2014 2357   AST 14 12/19/2014 1221   AST 30 11/22/2014 2357   ALT 31 12/19/2014 1221   ALT 45 11/22/2014 2357   BILITOT 0.38 12/19/2014 1221   BILITOT 0.8 11/22/2014 2357      ASSESSMENT & PLAN:  Diffuse large B cell lymphoma Clinically, he had excellent response to treatment. Per prior discussion, I will proceed with prophylactic IT administration to reduce risk of CNS relapse. He is started on  high dose prednisone today as part of his chemotherapy and also with the hope to prevent  allergic reaction with R-CHOP chemotherapy tomorrow. I will see him back in 3 weeks with blood work, history, physical examination, chemotherapy and IT administration.   Anemia in neoplastic disease I suspect the cause of anemia I will likely combination of anemia of chronic disease and possible bone marrow infiltration by lymphoma. I present time, he is not symptomatic. He can be observed and does not require blood transfusion.   Chronic kidney disease, stage III (moderate) He has chronic kidney disease and this is unrelated to the lymphoma. He did need not dose adjustment for his treatment.     DM (diabetes mellitus), type 2 with renal complications He has poorly controlled diabetes due to need recent steroidal treatment. He will be given insulin today.  Leukocytosis This is reactive in nature related to recent steroid treatment. He has no signs of infection. I will observe only.  Diagnostic Lumbar Puncture and Intrathecal administration of chemotherapy Procedure Note   Informed consent was obtained and potential risks including bleeding, infection and pain were reviewed with the patient.  The patient's name, date of birth, identification, consent and allergies were verified prior to the start of procedure and time out was performed.  The skin was prepped with Betadine solution.   5 cc of 1% lidocaine was used to provide local anaesthesia.   The L3/L4 intrathecal space was chosen as the site of procedure.  1 cc of clear CSF was obtained.  Next, 5 ml/12 mg methotrexate was administered into the intrathecal space.  The procedure was tolerated well and there were no complications.  The patient was stable at the end of the procedure.  Orders Placed This Encounter  Procedures  . Glucose, whole blood (CBG)    Standing Status: Future     Number of Occurrences: 1     Standing Expiration Date: 12/19/2015   All questions were answered. The patient knows to call the clinic with  any problems, questions or concerns. No barriers to learning was detected. I spent 40 minutes counseling the patient face to face. The total time spent in the appointment was 60 minutes and more than 50% was on counseling and review of test results     [facility], [place], MD 12/19/2014 4:35 PM

## 2014-12-19 NOTE — Assessment & Plan Note (Signed)
He has chronic kidney disease and this is unrelated to the lymphoma. He did need not dose adjustment for his treatment.

## 2014-12-19 NOTE — Progress Notes (Signed)
IT methotrexate given per Dr. Alvy Bimler via LP  Pt. Tolerated well. '@1500'$  repeat CBG=529. Dr. Alvy Bimler made aware. @ 1541 Regular Insulin 15 units given SQ per Dr. Alvy Bimler order. Instructed to drink increased of no sugar added fluids tonight.  Discharge instructions reviewed s/p LP and IT  Methotrexate.  Pt and wife verbalized understanding.

## 2014-12-19 NOTE — Patient Instructions (Signed)
[facility] Discharge Instructions for Patients Receiving Chemotherapy  Today you received the following chemotherapy agents: Methotrexate.  To help prevent nausea and vomiting after your treatment, we encourage you to take your nausea medication: Compazine 10 mg every 6 hours as needed. Zofran 8 mg every 12 hours as needed.   If you develop nausea and vomiting that is not controlled by your nausea medication, call the clinic.   BELOW ARE SYMPTOMS THAT SHOULD BE REPORTED IMMEDIATELY:  *FEVER GREATER THAN 100.5 F  *CHILLS WITH OR WITHOUT FEVER  NAUSEA AND VOMITING THAT IS NOT CONTROLLED WITH YOUR NAUSEA MEDICATION  *UNUSUAL SHORTNESS OF BREATH  *UNUSUAL BRUISING OR BLEEDING  TENDERNESS IN MOUTH AND THROAT WITH OR WITHOUT PRESENCE OF ULCERS  *URINARY PROBLEMS  *BOWEL PROBLEMS  UNUSUAL RASH Items with * indicate a potential emergency and should be followed up as soon as possible.  Feel free to call the clinic you have any questions or concerns. The clinic phone number is [phone].  Please show the [facility] at check-in to the Emergency Department and triage nurse.     Methotrexate injection What is this medicine? METHOTREXATE (METH oh TREX ate) is a chemotherapy drug. This medicine affects cells that are rapidly growing, such as cancer cells and cells in your mouth and stomach. It is used to treat many cancers and other medical conditions. It is used for leukemias, lymphomas, breast cancer, lung cancer, head and neck cancers, and other cancers. This medicine also works on the immune system and is commonly used to treat psoriasis and rheumatoid arthritis. This medicine may be used for other purposes; ask your health care provider or pharmacist if you have questions. What should I tell my health care provider before I take this medicine? They need to know if you have any of these conditions: -if you frequently drink alcohol containing  drinks -infection (especially a virus infection such as chickenpox, cold sores, or herpes) -immune system problems -kidney disease -liver disease -low blood counts, like platelets, red bloods, or white blood cells -lung disease -recent or ongoing radiation therapy -an unusual or allergic reaction to methotrexate, benzyl alcohol, other medicines, foods, dyes, or preservatives -pregnant or trying to get pregnant -breast-feeding How should I use this medicine? This drug is given as an injection into a muscle or into a vein. It may also be given into the spinal fluid. It is administered in a hospital or clinic by a specially trained health care professional. Talk to your pediatrician regarding the use of this medicine in children. While this drug may be prescribed for selected conditions, precautions do apply. Overdosage: If you think you have taken too much of this medicine contact a poison control center or emergency room at once. NOTE: This medicine is only for you. Do not share this medicine with others. What if I miss a dose? It is important not to miss your dose. Call your doctor or health care professional if you are unable to keep an appointment. What may interact with this medicine? -antibiotics and other medicines for infections -aspirin and aspirin-like medicines including bismuth subsalicylate (Pepto-Bismol) -cisplatin -dapsone -folic acid in supplements or vitamins -mercaptopurine -NSAIDs, medicines for pain and inflammation, like ibuprofen or naproxen -pemetrexed -phenylbutazone -phenytoin -probenecid -pyrimethamine -theophylline -trimetrexate -vaccines This list may not describe all possible interactions. Give your health care provider a list of all the medicines, herbs, non-prescription drugs, or dietary supplements you use. Also tell them if you smoke, drink alcohol, or  use illegal drugs. Some items may interact with your medicine. What should I watch for while using  this medicine? Visit your doctor for checks on your progress. You will need to have regular blood checks during your treatment to monitor your blood, liver function, and kidney function. This drug may make you feel generally unwell. This is not uncommon, as chemotherapy can affect healthy cells as well as cancer cells. Report any side effects. Continue your course of treatment even though you feel ill unless your doctor tells you to stop. In some cases, you may be given additional medicines to help with side effects. Follow all directions for their use. Call your doctor or health care professional for advice if you get a fever, chills or sore throat, or other symptoms of a cold or flu. Do not treat yourself. This drug decreases your body's ability to fight infections. Try to avoid being around people who are sick. This medicine may increase your risk to bruise or bleed. Call your doctor or health care professional if you notice any unusual bleeding. Be careful brushing and flossing your teeth or using a toothpick because you may get an infection or bleed more easily. If you have any dental work done, tell your dentist you are receiving this medicine. Avoid taking products that contain aspirin, acetaminophen, ibuprofen, naproxen, or ketoprofen unless instructed by your doctor. These medicines may hide a fever. This medicine can make you more sensitive to the sun. Keep out of the sun. If you cannot avoid being in the sun, wear protective clothing and use sunscreen. Do not use sun lamps or tanning beds/booths. Do not treat diarrhea with over the counter products. Contact your doctor if you have diarrhea. To protect your kidneys, drink water or other fluids as directed while you are taking this medicine. Do not drink alcohol-containing drinks while taking this medicine. Both alcohol and the medicine may cause damage to your liver. Men and women must use effective birth control while they are taking this  medicine. Do not become pregnant while taking this medicine. Women must continue using effective birth control for 1 full menstrual cycle after stopping this medicine. Tell your doctor right away if you think that you or your partner might be pregnant. There is a potential for serious side effects to an unborn child. Talk to your health care professional or pharmacist for more information. Do not breast-feed an infant while taking this medicine. Men must continue effective birth control for 3 months after stopping this medicine. What side effects may I notice from receiving this medicine? Side effects that you should report to your doctor or health care professional as soon as possible: -allergic reactions like skin rash, itching or hives, swelling of the face, lips, or tongue -low blood counts - this medicine may decrease the number of white blood cells, red blood cells and platelets. You may be at increased risk for infections and bleeding. -signs of infection - fever or chills, cough, sore throat, pain or difficulty passing urine -signs of decreased platelets or bleeding - bruising, pinpoint red spots on the skin, black, tarry stools, blood in the urine -signs of decreased red blood cells - unusually weak or tired, fainting spells, lightheadedness -breathing problems, like a dry cough -changes in vision -confusion, not alert -diarrhea -mouth or throat sores or ulcers -problems with balance, talking, walking -redness, blistering, peeling or loosening of the skin, including inside the mouth -seizures -trouble passing urine or change in the amount of urine -vomiting -  yellowing of the eyes or skin Side effects that usually do not require medical attention (report to your doctor or health care professional if they continue or are bothersome): -change in skin color -eye irritation -hair loss -headache -loss of appetite -nausea -stomach upset This list may not describe all possible side  effects. Call your doctor for medical advice about side effects. You may report side effects to FDA at 1-800-FDA-1088. Where should I keep my medicine? This drug is given in a hospital or clinic and will not be stored at home. NOTE: This sheet is a summary. It may not cover all possible information. If you have questions about this medicine, talk to your doctor, pharmacist, or health care provider.  2015, [employer] Standard. (2007-12-28 11:13:24)

## 2014-12-19 NOTE — Assessment & Plan Note (Signed)
He has poorly controlled diabetes due to need recent steroidal treatment. He will be given insulin today.

## 2014-12-19 NOTE — Patient Instructions (Signed)

## 2014-12-20 ENCOUNTER — Other Ambulatory Visit: Payer: Self-pay | Admitting: Hematology and Oncology

## 2014-12-20 ENCOUNTER — Ambulatory Visit (HOSPITAL_BASED_OUTPATIENT_CLINIC_OR_DEPARTMENT_OTHER): Payer: Medicare Other

## 2014-12-20 VITALS — BP 120/60 | HR 87 | Temp 97.9°F | Resp 18

## 2014-12-20 DIAGNOSIS — Z5111 Encounter for antineoplastic chemotherapy: Secondary | ICD-10-CM

## 2014-12-20 DIAGNOSIS — E1122 Type 2 diabetes mellitus with diabetic chronic kidney disease: Secondary | ICD-10-CM

## 2014-12-20 DIAGNOSIS — C8330 Diffuse large B-cell lymphoma, unspecified site: Secondary | ICD-10-CM

## 2014-12-20 DIAGNOSIS — E1129 Type 2 diabetes mellitus with other diabetic kidney complication: Secondary | ICD-10-CM

## 2014-12-20 DIAGNOSIS — C8590 Non-Hodgkin lymphoma, unspecified, unspecified site: Secondary | ICD-10-CM

## 2014-12-20 DIAGNOSIS — Z5112 Encounter for antineoplastic immunotherapy: Secondary | ICD-10-CM | POA: Diagnosis not present

## 2014-12-20 LAB — WHOLE BLOOD GLUCOSE
GLUCOSE: 383 mg/dL — AB (ref 70–100)
GLUCOSE: 385 mg/dL — AB (ref 70–100)
Glucose: 394 mg/dL — ABNORMAL HIGH (ref 70–100)
Glucose: 465 mg/dL — ABNORMAL HIGH (ref 70–100)
Glucose: 400 mg/dL — ABNORMAL HIGH (ref 70–100)
HRS PC: 3 h
HRS PC: 1 Hours
HRS PC: 0.25 h
HRS PC: 2 Hours

## 2014-12-20 MED ORDER — VINCRISTINE SULFATE CHEMO INJECTION 1 MG/ML
2.0000 mg | Freq: Once | INTRAVENOUS | Status: AC
  Administered 2014-12-20: 2 mg via INTRAVENOUS
  Filled 2014-12-20: qty 2

## 2014-12-20 MED ORDER — DOXORUBICIN HCL CHEMO IV INJECTION 2 MG/ML
50.0000 mg/m2 | Freq: Once | INTRAVENOUS | Status: AC
  Administered 2014-12-20: 96 mg via INTRAVENOUS
  Filled 2014-12-20: qty 48

## 2014-12-20 MED ORDER — RITUXIMAB CHEMO INJECTION 500 MG/50ML
375.0000 mg/m2 | Freq: Once | INTRAVENOUS | Status: AC
  Administered 2014-12-20: 700 mg via INTRAVENOUS
  Filled 2014-12-20: qty 70

## 2014-12-20 MED ORDER — DIPHENHYDRAMINE HCL 25 MG PO CAPS
ORAL_CAPSULE | ORAL | Status: AC
  Filled 2014-12-20: qty 2

## 2014-12-20 MED ORDER — INSULIN REGULAR HUMAN 100 UNIT/ML IJ SOLN
18.0000 [IU] | Freq: Once | INTRAMUSCULAR | Status: AC
  Administered 2014-12-20: 18 [IU] via SUBCUTANEOUS
  Filled 2014-12-20: qty 0.18

## 2014-12-20 MED ORDER — INSULIN REGULAR HUMAN 100 UNIT/ML IJ SOLN
15.0000 [IU] | Freq: Once | INTRAMUSCULAR | Status: AC
  Administered 2014-12-20: 15 [IU] via SUBCUTANEOUS
  Filled 2014-12-20: qty 0.15

## 2014-12-20 MED ORDER — SODIUM CHLORIDE 0.9 % IV SOLN
Freq: Once | INTRAVENOUS | Status: AC
  Administered 2014-12-20: 10:00:00 via INTRAVENOUS
  Filled 2014-12-20: qty 8

## 2014-12-20 MED ORDER — INSULIN REGULAR HUMAN 100 UNIT/ML IJ SOLN
20.0000 [IU] | Freq: Once | INTRAMUSCULAR | Status: AC
  Administered 2014-12-20: 20 [IU] via SUBCUTANEOUS
  Filled 2014-12-20: qty 0.2

## 2014-12-20 MED ORDER — ACETAMINOPHEN 325 MG PO TABS
ORAL_TABLET | ORAL | Status: AC
  Filled 2014-12-20: qty 2

## 2014-12-20 MED ORDER — SODIUM CHLORIDE 0.9 % IJ SOLN
10.0000 mL | INTRAMUSCULAR | Status: DC | PRN
  Administered 2014-12-20: 10 mL
  Filled 2014-12-20: qty 10

## 2014-12-20 MED ORDER — SODIUM CHLORIDE 0.9 % IV SOLN
750.0000 mg/m2 | Freq: Once | INTRAVENOUS | Status: AC
  Administered 2014-12-20: 1420 mg via INTRAVENOUS
  Filled 2014-12-20: qty 71

## 2014-12-20 MED ORDER — DIPHENHYDRAMINE HCL 25 MG PO CAPS
50.0000 mg | ORAL_CAPSULE | Freq: Once | ORAL | Status: AC
  Administered 2014-12-20: 50 mg via ORAL

## 2014-12-20 MED ORDER — INSULIN REGULAR HUMAN 100 UNIT/ML IJ SOLN
12.0000 [IU] | Freq: Once | INTRAMUSCULAR | Status: AC
  Administered 2014-12-20: 12 [IU] via SUBCUTANEOUS
  Filled 2014-12-20: qty 0.12

## 2014-12-20 MED ORDER — HEPARIN SOD (PORK) LOCK FLUSH 100 UNIT/ML IV SOLN
500.0000 [IU] | Freq: Once | INTRAVENOUS | Status: AC | PRN
  Administered 2014-12-20: 500 [IU]
  Filled 2014-12-20: qty 5

## 2014-12-20 MED ORDER — INSULIN REGULAR HUMAN 100 UNIT/ML IJ SOLN
10.0000 [IU] | Freq: Once | INTRAMUSCULAR | Status: AC
  Administered 2014-12-20: 10 [IU] via SUBCUTANEOUS
  Filled 2014-12-20: qty 0.1

## 2014-12-20 MED ORDER — ACETAMINOPHEN 325 MG PO TABS
650.0000 mg | ORAL_TABLET | Freq: Once | ORAL | Status: AC
  Administered 2014-12-20: 650 mg via ORAL

## 2014-12-20 MED ORDER — SODIUM CHLORIDE 0.9 % IV SOLN
Freq: Once | INTRAVENOUS | Status: AC
  Administered 2014-12-20: 10:00:00 via INTRAVENOUS

## 2014-12-20 NOTE — Patient Instructions (Addendum)
[facility] Discharge Instructions for Patients Receiving Chemotherapy    ###STOP TAKING PREDNISONE UNTIL NEXT VISIT W/ DR. Alvy Bimler ON 7/7###   Today you received the following chemotherapy agents Adriamycin/Vincristine/Cytoxan/Rituxan.  To help prevent nausea and vomiting after your treatment, we encourage you to take your nausea medication as prescribed.   If you develop nausea and vomiting that is not controlled by your nausea medication, call the clinic.   BELOW ARE SYMPTOMS THAT SHOULD BE REPORTED IMMEDIATELY:  *FEVER GREATER THAN 100.5 F  *CHILLS WITH OR WITHOUT FEVER  NAUSEA AND VOMITING THAT IS NOT CONTROLLED WITH YOUR NAUSEA MEDICATION  *UNUSUAL SHORTNESS OF BREATH  *UNUSUAL BRUISING OR BLEEDING  TENDERNESS IN MOUTH AND THROAT WITH OR WITHOUT PRESENCE OF ULCERS  *URINARY PROBLEMS  *BOWEL PROBLEMS  UNUSUAL RASH Items with * indicate a potential emergency and should be followed up as soon as possible.  Feel free to call the clinic you have any questions or concerns. The clinic phone number is [phone].  Please show the [place] at check-in to the Emergency Department and triage nurse.  Blood sugars today.  [id number]   10 units Regular [id number] 385    12 units Regular 1215 394    15 units Regular 1315 465    18 units Regular 1415 400    20 units Regular  [phone]

## 2014-12-20 NOTE — Progress Notes (Signed)
1572 Blood glucose 383, covered with 10 units Reg insulin 1059 blood glucose 385, covered with 12 units Reg insulin. 1215 Blood glucose 394, covered with 15 units Reg insulin. 1315 Blood glucose 465, covered with 18 units Reg insulin. 1415 Blood glucose 400, covered with 20 units Reg insulin.

## 2014-12-23 ENCOUNTER — Telehealth: Payer: Self-pay | Admitting: Hematology and Oncology

## 2014-12-23 ENCOUNTER — Ambulatory Visit (HOSPITAL_BASED_OUTPATIENT_CLINIC_OR_DEPARTMENT_OTHER): Payer: Medicare Other

## 2014-12-23 VITALS — BP 127/63 | HR 99 | Temp 97.7°F

## 2014-12-23 DIAGNOSIS — C8330 Diffuse large B-cell lymphoma, unspecified site: Secondary | ICD-10-CM

## 2014-12-23 DIAGNOSIS — Z5189 Encounter for other specified aftercare: Secondary | ICD-10-CM | POA: Diagnosis not present

## 2014-12-23 MED ORDER — PEGFILGRASTIM INJECTION 6 MG/0.6ML ~~LOC~~
6.0000 mg | PREFILLED_SYRINGE | Freq: Once | SUBCUTANEOUS | Status: AC
  Administered 2014-12-23: 6 mg via SUBCUTANEOUS
  Filled 2014-12-23: qty 0.6

## 2014-12-23 NOTE — Telephone Encounter (Signed)
Pt got new sched on 6.17

## 2014-12-31 ENCOUNTER — Ambulatory Visit (HOSPITAL_BASED_OUTPATIENT_CLINIC_OR_DEPARTMENT_OTHER): Payer: Medicare Other

## 2014-12-31 ENCOUNTER — Ambulatory Visit: Payer: Medicare Other

## 2014-12-31 ENCOUNTER — Other Ambulatory Visit: Payer: Self-pay | Admitting: *Deleted

## 2014-12-31 ENCOUNTER — Other Ambulatory Visit (HOSPITAL_BASED_OUTPATIENT_CLINIC_OR_DEPARTMENT_OTHER): Payer: Medicare Other

## 2014-12-31 VITALS — BP 107/53 | HR 91 | Temp 98.2°F | Resp 18

## 2014-12-31 DIAGNOSIS — N183 Chronic kidney disease, stage 3 (moderate): Secondary | ICD-10-CM | POA: Diagnosis not present

## 2014-12-31 DIAGNOSIS — C8330 Diffuse large B-cell lymphoma, unspecified site: Secondary | ICD-10-CM

## 2014-12-31 DIAGNOSIS — Z95828 Presence of other vascular implants and grafts: Secondary | ICD-10-CM

## 2014-12-31 DIAGNOSIS — D630 Anemia in neoplastic disease: Secondary | ICD-10-CM | POA: Diagnosis not present

## 2014-12-31 LAB — HOLD TUBE, BLOOD BANK

## 2014-12-31 LAB — CBC WITH DIFFERENTIAL/PLATELET
BASO%: 0.1 % (ref 0.0–2.0)
Basophils Absolute: 0.1 10*3/uL (ref 0.0–0.1)
EOS%: 0.8 % (ref 0.0–7.0)
Eosinophils Absolute: 0.5 10*3/uL (ref 0.0–0.5)
HEMATOCRIT: 22.7 % — AB (ref 38.4–49.9)
HGB: 7.4 g/dL — ABNORMAL LOW (ref 13.0–17.1)
LYMPH%: 1.4 % — ABNORMAL LOW (ref 14.0–49.0)
MCH: 27.2 pg (ref 27.2–33.4)
MCHC: 32.8 g/dL (ref 32.0–36.0)
MCV: 82.7 fL (ref 79.3–98.0)
MONO#: 2.7 10*3/uL — ABNORMAL HIGH (ref 0.1–0.9)
MONO%: 4.6 % (ref 0.0–14.0)
NEUT#: 54.6 10*3/uL — ABNORMAL HIGH (ref 1.5–6.5)
NEUT%: 93.1 % — ABNORMAL HIGH (ref 39.0–75.0)
Platelets: 140 10*3/uL (ref 140–400)
RBC: 2.74 10*6/uL — AB (ref 4.20–5.82)
RDW: 18.5 % — AB (ref 11.0–14.6)
WBC: 58.6 10*3/uL (ref 4.0–10.3)
lymph#: 0.8 10*3/uL — ABNORMAL LOW (ref 0.9–3.3)

## 2014-12-31 LAB — PREPARE RBC (CROSSMATCH)

## 2014-12-31 MED ORDER — HEPARIN SOD (PORK) LOCK FLUSH 100 UNIT/ML IV SOLN
500.0000 [IU] | Freq: Every day | INTRAVENOUS | Status: AC | PRN
  Administered 2014-12-31: 500 [IU]
  Filled 2014-12-31: qty 5

## 2014-12-31 MED ORDER — DIPHENHYDRAMINE HCL 25 MG PO CAPS
25.0000 mg | ORAL_CAPSULE | Freq: Once | ORAL | Status: AC
  Administered 2014-12-31: 25 mg via ORAL

## 2014-12-31 MED ORDER — ACETAMINOPHEN 325 MG PO TABS
ORAL_TABLET | ORAL | Status: AC
  Filled 2014-12-31: qty 2

## 2014-12-31 MED ORDER — SODIUM CHLORIDE 0.9 % IJ SOLN
10.0000 mL | INTRAMUSCULAR | Status: DC | PRN
  Administered 2014-12-31: 10 mL via INTRAVENOUS
  Filled 2014-12-31: qty 10

## 2014-12-31 MED ORDER — SODIUM CHLORIDE 0.9 % IV SOLN
250.0000 mL | Freq: Once | INTRAVENOUS | Status: AC
  Administered 2014-12-31: 250 mL via INTRAVENOUS

## 2014-12-31 MED ORDER — SODIUM CHLORIDE 0.9 % IJ SOLN
10.0000 mL | INTRAMUSCULAR | Status: AC | PRN
  Administered 2014-12-31: 10 mL
  Filled 2014-12-31: qty 10

## 2014-12-31 MED ORDER — DIPHENHYDRAMINE HCL 25 MG PO CAPS
ORAL_CAPSULE | ORAL | Status: AC
  Filled 2014-12-31: qty 1

## 2014-12-31 MED ORDER — ACETAMINOPHEN 325 MG PO TABS
650.0000 mg | ORAL_TABLET | Freq: Once | ORAL | Status: AC
  Administered 2014-12-31: 650 mg via ORAL

## 2014-12-31 NOTE — Patient Instructions (Signed)

## 2014-12-31 NOTE — Patient Instructions (Signed)

## 2015-01-01 LAB — TYPE AND SCREEN
ABO/RH(D): O POS
ANTIBODY SCREEN: NEGATIVE
UNIT DIVISION: 0
UNIT DIVISION: 0

## 2015-01-02 ENCOUNTER — Encounter: Payer: Self-pay | Admitting: Physician Assistant

## 2015-01-02 ENCOUNTER — Ambulatory Visit (INDEPENDENT_AMBULATORY_CARE_PROVIDER_SITE_OTHER): Payer: Medicare Other | Admitting: Emergency Medicine

## 2015-01-02 VITALS — BP 126/60 | HR 107 | Temp 97.6°F | Resp 16 | Wt 144.2 lb

## 2015-01-02 DIAGNOSIS — I129 Hypertensive chronic kidney disease with stage 1 through stage 4 chronic kidney disease, or unspecified chronic kidney disease: Secondary | ICD-10-CM

## 2015-01-02 DIAGNOSIS — N181 Chronic kidney disease, stage 1: Secondary | ICD-10-CM

## 2015-01-02 DIAGNOSIS — N182 Chronic kidney disease, stage 2 (mild): Secondary | ICD-10-CM

## 2015-01-02 DIAGNOSIS — N185 Chronic kidney disease, stage 5: Secondary | ICD-10-CM | POA: Diagnosis not present

## 2015-01-02 DIAGNOSIS — E785 Hyperlipidemia, unspecified: Secondary | ICD-10-CM

## 2015-01-02 DIAGNOSIS — N184 Chronic kidney disease, stage 4 (severe): Secondary | ICD-10-CM | POA: Diagnosis not present

## 2015-01-02 DIAGNOSIS — N183 Chronic kidney disease, stage 3 unspecified: Secondary | ICD-10-CM

## 2015-01-02 DIAGNOSIS — E1122 Type 2 diabetes mellitus with diabetic chronic kidney disease: Secondary | ICD-10-CM | POA: Diagnosis not present

## 2015-01-02 DIAGNOSIS — N189 Chronic kidney disease, unspecified: Secondary | ICD-10-CM

## 2015-01-02 LAB — POCT GLYCOSYLATED HEMOGLOBIN (HGB A1C): HEMOGLOBIN A1C: 8

## 2015-01-02 LAB — GLUCOSE, POCT (MANUAL RESULT ENTRY): POC Glucose: 202 mg/dl — AB (ref 70–99)

## 2015-01-02 NOTE — Patient Instructions (Signed)
I will send my note to your oncologist. We will need to coordinate the insulin to control your glucose when you receive the steroids with your chemo treatments. Please check your glucose twice a day for the week following your treatments. If your glucose remains elevated beyond the day of your treatments, we'll need to add insulin for those days, too.

## 2015-01-02 NOTE — Progress Notes (Signed)
[name], male    DOB: Apr 19, 1951, 64 y.o.   MRN: [id number]  PCP: Kevis Qu, PA-C  Subjective:   Chief Complaint  Patient presents with  . Diabetes    HPI Presents for evaluation of diabetes.   Since I saw him last on 09/24/2014, he has been diagnoses with Diffuse Large B Cell Lymphoma. His wife has recently completed treatment for breast cancer.  During last chemo treatment his glucose went up (300's-553) and he required several doses of insulin. Has had to have whole blood transfusions as well.  He's adjusting to his treatments and being "sick." He hasn't had nausea, and other than his glucose being elevated, he's tolerated treatments well so far.    Review of Systems  Constitutional: Negative for activity change, appetite change, fatigue and unexpected weight change.  HENT: Negative for congestion, dental problem, ear pain, hearing loss, mouth sores, postnasal drip, rhinorrhea, sneezing, sore throat, tinnitus and trouble swallowing.   Eyes: Negative for photophobia, pain, redness and visual disturbance.  Respiratory: Negative for cough, chest tightness and shortness of breath.   Cardiovascular: Negative for chest pain, palpitations and leg swelling.  Gastrointestinal: Negative for nausea, vomiting, abdominal pain, diarrhea, constipation and blood in stool.  Genitourinary: Negative for dysuria, urgency, frequency and hematuria.  Musculoskeletal: Negative for myalgias, arthralgias, gait problem and neck stiffness.  Skin: Negative for rash.  Neurological: Negative for dizziness, speech difficulty, weakness, light-headedness, numbness and headaches.  Hematological: Negative for adenopathy.  Psychiatric/Behavioral: Negative for confusion and sleep disturbance. The patient is not nervous/anxious.        Patient Active Problem List   Diagnosis Date Noted  . Leukocytosis 12/19/2014  . Thrombocytopenia due to drugs 12/11/2014  . Diffuse large B cell  lymphoma 11/04/2014  . Anemia in neoplastic disease 11/04/2014  . Hyperlipidemia 05/11/2013  . Hypertensive renal disease   . DM (diabetes mellitus), type 2 with renal complications   . Chronic kidney disease, stage III (moderate)   . History of tobacco abuse   . Erectile dysfunction      Prior to Admission medications   Medication Sig Start Date End Date Taking? Authorizing Provider  acetaminophen (TYLENOL) 500 MG tablet Take 500 mg by mouth daily. X 5 days after chemo   Yes Historical Provider, MD  amLODipine (NORVASC) 10 MG tablet TAKE ONE TABLET BY MOUTH ONCE DAILY 09/24/14  Yes Stefhanie Kachmar, PA-C  aspirin 81 MG tablet Take 81 mg by mouth daily.   Yes Historical Provider, MD  fish oil-omega-3 fatty acids 1000 MG capsule Take 1 g by mouth daily.   Yes Historical Provider, MD  glipiZIDE (GLUCOTROL XL) 10 MG 24 hr tablet TAKE ONE TABLET BY MOUTH ONCE DAILY 09/24/14  Yes Luisfelipe Engelstad, PA-C  glucose blood (ACCU-CHEK AVIVA PLUS) test strip Use as instructed 09/24/14  Yes Carden Teel, PA-C  hydrochlorothiazide (HYDRODIURIL) 25 MG tablet TAKE ONE TABLET BY MOUTH ONCE DAILY 09/24/14  Yes Gerrod Maule, PA-C  loratadine (CLARITIN) 10 MG tablet Take 10 mg by mouth daily. 5 days after chemo   Yes Historical Provider, MD  losartan (COZAAR) 100 MG tablet Take 1 tablet (100 mg total) by mouth daily. 09/24/14  Yes Benigna Delisi, PA-C  pravastatin (PRAVACHOL) 40 MG tablet TAKE ONE TABLET BY MOUTH ONCE DAILY 09/24/14  Yes Evin Loiseau, PA-C  sitaGLIPtin (JANUVIA) 50 MG tablet Take 1 tablet (50 mg total) by mouth daily. 09/24/14  Yes Zuriyah Shatz, PA-C  HYDROcodone-acetaminophen (NORCO) 5-325 MG per tablet Take  1-2 tablets by mouth every 6 (six) hours as needed. Patient not taking: Reported on 01/02/2015 11/19/14   Fanny Skates, MD  lidocaine-prilocaine (EMLA) cream Apply to affected area once Patient not taking: Reported on 01/02/2015 11/21/14   Heath Lark, MD  ondansetron (ZOFRAN) 8 MG tablet Take  1 tablet (8 mg total) by mouth 2 (two) times daily. Start the day after chemo for 3 days. Then as needed for nausea or vomiting. Patient not taking: Reported on 01/02/2015 11/21/14   Heath Lark, MD  prochlorperazine (COMPAZINE) 10 MG tablet Take 1 tablet (10 mg total) by mouth every 6 (six) hours as needed (Nausea or vomiting). Patient not taking: Reported on 01/02/2015 11/21/14   Heath Lark, MD     Allergies  Allergen Reactions  . Ace Inhibitors Hives  . Rituximab Other (See Comments)    This medication caused him to sweat & shake       Objective:  Physical Exam  Constitutional: He is oriented to person, place, and time. Vital signs are normal. He appears well-developed and well-nourished. He is active and cooperative. No distress.  BP 142/60 mmHg  Pulse 107  Temp(Src) 97.6 F (36.4 C) (Oral)  Resp 16  Wt 144 lb 3.2 oz (65.409 kg)  SpO2 94%  HENT:  Head: Normocephalic and atraumatic.  Right Ear: Hearing normal.  Left Ear: Hearing normal.  Eyes: Conjunctivae are normal. No scleral icterus.  Neck: Normal range of motion. Neck supple. No thyromegaly present.  Cardiovascular: Normal rate, regular rhythm and normal heart sounds.   Pulses:      Radial pulses are 2+ on the right side, and 2+ on the left side.  Pulmonary/Chest: Effort normal and breath sounds normal.  Lymphadenopathy:       Head (right side): No tonsillar, no preauricular, no posterior auricular and no occipital adenopathy present.       Head (left side): No tonsillar, no preauricular, no posterior auricular and no occipital adenopathy present.    He has no cervical adenopathy.       Right: No supraclavicular adenopathy present.       Left: No supraclavicular adenopathy present.  Neurological: He is alert and oriented to person, place, and time. No sensory deficit.  Skin: Skin is warm, dry and intact. No rash noted. No cyanosis or erythema. Nails show no clubbing.  Psychiatric: He has a normal mood and affect.    Results for orders placed or performed in visit on 01/02/15  POCT glucose (manual entry)  Result Value Ref Range   POC Glucose 202 (A) 70 - 99 mg/dl  POCT glycosylated hemoglobin (Hb A1C)  Result Value Ref Range   Hemoglobin A1C 8.0            Assessment & Plan:   1. Type 2 diabetes mellitus with diabetic chronic kidney disease Hyperglycemia due to steroids given as part of his chemotherapy. As long as he's in the 200's-low 300's, I would hold off adding insulin to his daily regimen. He should continue to receive insulin by sliding scale with his treatments, and he'll check his glucose at home BID for a week following each treatment. If he's running above 300, we'll add insulin, perhaps even only for the first few days after treatments, if that when he's elevated.. - POCT glucose (manual entry) - POCT glycosylated hemoglobin (Hb A1C)  2. Hypertensive renal disease, stage 1-4 or unspecified chronic kidney disease We'll certainly work hard to manage his BP and glucose during his treatments. Otherwise  continue current treatment for now.  3. Hyperlipidemia Has been stable.    Fara Chute, PA-C Physician Assistant-Certified [facility] .

## 2015-01-09 ENCOUNTER — Ambulatory Visit (HOSPITAL_BASED_OUTPATIENT_CLINIC_OR_DEPARTMENT_OTHER): Payer: Medicare Other | Admitting: Hematology and Oncology

## 2015-01-09 ENCOUNTER — Other Ambulatory Visit: Payer: Self-pay | Admitting: Hematology and Oncology

## 2015-01-09 ENCOUNTER — Other Ambulatory Visit (HOSPITAL_BASED_OUTPATIENT_CLINIC_OR_DEPARTMENT_OTHER): Payer: Medicare Other

## 2015-01-09 ENCOUNTER — Telehealth: Payer: Self-pay | Admitting: Hematology and Oncology

## 2015-01-09 ENCOUNTER — Ambulatory Visit (HOSPITAL_BASED_OUTPATIENT_CLINIC_OR_DEPARTMENT_OTHER): Payer: Medicare Other

## 2015-01-09 ENCOUNTER — Ambulatory Visit: Payer: Medicare Other

## 2015-01-09 ENCOUNTER — Encounter: Payer: Self-pay | Admitting: Hematology and Oncology

## 2015-01-09 VITALS — BP 127/66 | HR 91 | Temp 97.0°F | Resp 16

## 2015-01-09 DIAGNOSIS — Z95828 Presence of other vascular implants and grafts: Secondary | ICD-10-CM

## 2015-01-09 DIAGNOSIS — N183 Chronic kidney disease, stage 3 unspecified: Secondary | ICD-10-CM

## 2015-01-09 DIAGNOSIS — D630 Anemia in neoplastic disease: Secondary | ICD-10-CM

## 2015-01-09 DIAGNOSIS — C8330 Diffuse large B-cell lymphoma, unspecified site: Secondary | ICD-10-CM | POA: Diagnosis not present

## 2015-01-09 DIAGNOSIS — Z5111 Encounter for antineoplastic chemotherapy: Secondary | ICD-10-CM | POA: Diagnosis not present

## 2015-01-09 DIAGNOSIS — D75838 Other thrombocytosis: Secondary | ICD-10-CM

## 2015-01-09 DIAGNOSIS — R748 Abnormal levels of other serum enzymes: Secondary | ICD-10-CM | POA: Insufficient documentation

## 2015-01-09 DIAGNOSIS — C8590 Non-Hodgkin lymphoma, unspecified, unspecified site: Secondary | ICD-10-CM

## 2015-01-09 DIAGNOSIS — D72829 Elevated white blood cell count, unspecified: Secondary | ICD-10-CM

## 2015-01-09 DIAGNOSIS — R7989 Other specified abnormal findings of blood chemistry: Secondary | ICD-10-CM

## 2015-01-09 LAB — COMPREHENSIVE METABOLIC PANEL (CC13)
ALBUMIN: 2.5 g/dL — AB (ref 3.5–5.0)
ALT: 66 U/L — ABNORMAL HIGH (ref 0–55)
AST: 27 U/L (ref 5–34)
Alkaline Phosphatase: 115 U/L (ref 40–150)
Anion Gap: 11 mEq/L (ref 3–11)
BUN: 31.8 mg/dL — AB (ref 7.0–26.0)
CALCIUM: 9.1 mg/dL (ref 8.4–10.4)
CHLORIDE: 101 meq/L (ref 98–109)
CO2: 22 mEq/L (ref 22–29)
Creatinine: 1.5 mg/dL — ABNORMAL HIGH (ref 0.7–1.3)
EGFR: 54 mL/min/{1.73_m2} — AB (ref >90)
GLUCOSE: 263 mg/dL — AB (ref 70–140)
POTASSIUM: 4.2 meq/L (ref 3.5–5.1)
Sodium: 135 mEq/L — ABNORMAL LOW (ref 136–145)
TOTAL PROTEIN: 6 g/dL — AB (ref 6.4–8.3)
Total Bilirubin: 0.28 mg/dL (ref 0.20–1.20)

## 2015-01-09 LAB — CBC WITH DIFFERENTIAL/PLATELET
BASO%: 0.6 % (ref 0.0–2.0)
Basophils Absolute: 0.2 10*3/uL — ABNORMAL HIGH (ref 0.0–0.1)
EOS ABS: 0 10*3/uL (ref 0.0–0.5)
EOS%: 0.2 % (ref 0.0–7.0)
HCT: 32.5 % — ABNORMAL LOW (ref 38.4–49.9)
HGB: 10.9 g/dL — ABNORMAL LOW (ref 13.0–17.1)
LYMPH%: 1.6 % — ABNORMAL LOW (ref 14.0–49.0)
MCH: 27.8 pg (ref 27.2–33.4)
MCHC: 33.5 g/dL (ref 32.0–36.0)
MCV: 82.9 fL (ref 79.3–98.0)
MONO#: 0.7 10*3/uL (ref 0.1–0.9)
MONO%: 2.3 % (ref 0.0–14.0)
NEUT%: 95.3 % — ABNORMAL HIGH (ref 39.0–75.0)
NEUTROS ABS: 29.3 10*3/uL — AB (ref 1.5–6.5)
PLATELETS: 630 10*3/uL — AB (ref 140–400)
RBC: 3.92 10*6/uL — AB (ref 4.20–5.82)
RDW: 16.9 % — AB (ref 11.0–14.6)
WBC: 30.7 10*3/uL — ABNORMAL HIGH (ref 4.0–10.3)
lymph#: 0.5 10*3/uL — ABNORMAL LOW (ref 0.9–3.3)

## 2015-01-09 MED ORDER — HEPARIN SOD (PORK) LOCK FLUSH 100 UNIT/ML IV SOLN
500.0000 [IU] | Freq: Once | INTRAVENOUS | Status: AC | PRN
  Administered 2015-01-09: 500 [IU]
  Filled 2015-01-09: qty 5

## 2015-01-09 MED ORDER — SODIUM CHLORIDE 0.9 % IJ SOLN
10.0000 mL | INTRAMUSCULAR | Status: DC | PRN
  Administered 2015-01-09: 10 mL
  Filled 2015-01-09: qty 10

## 2015-01-09 MED ORDER — PREDNISONE 20 MG PO TABS: 60.0000 mg | ORAL_TABLET | Freq: Every day | ORAL | Status: DC

## 2015-01-09 MED ORDER — SODIUM CHLORIDE 0.9 % IV SOLN
Freq: Once | INTRAVENOUS | Status: AC
  Administered 2015-01-09: 13:00:00 via INTRAVENOUS

## 2015-01-09 MED ORDER — SODIUM CHLORIDE 0.9 % IJ SOLN
10.0000 mL | INTRAMUSCULAR | Status: DC | PRN
  Administered 2015-01-09: 10 mL via INTRAVENOUS
  Filled 2015-01-09: qty 10

## 2015-01-09 MED ORDER — SODIUM CHLORIDE 0.9 % IJ SOLN
Freq: Once | INTRAMUSCULAR | Status: AC
  Administered 2015-01-09: 15:00:00 via INTRATHECAL
  Filled 2015-01-09: qty 0.48

## 2015-01-09 NOTE — Telephone Encounter (Signed)
labs/ov per 07/07 POF, .... KJ, sent msg to add chemo and to give pt updated calendar before he leaves chemo today per MD..Marland Kitchen

## 2015-01-09 NOTE — Assessment & Plan Note (Signed)
This is related to recent treatment. He is not symptomatic. Will observe.

## 2015-01-09 NOTE — Patient Instructions (Signed)
Methotrexate injection What is this medicine? METHOTREXATE (METH oh TREX ate) is a chemotherapy drug. This medicine affects cells that are rapidly growing, such as cancer cells and cells in your mouth and stomach. It is used to treat many cancers and other medical conditions. It is used for leukemias, lymphomas, breast cancer, lung cancer, head and neck cancers, and other cancers. This medicine also works on the immune system and is commonly used to treat psoriasis and rheumatoid arthritis. This medicine may be used for other purposes; ask your health care provider or pharmacist if you have questions. What should I tell my health care provider before I take this medicine? They need to know if you have any of these conditions: -if you frequently drink alcohol containing drinks -infection (especially a virus infection such as chickenpox, cold sores, or herpes) -immune system problems -kidney disease -liver disease -low blood counts, like platelets, red bloods, or white blood cells -lung disease -recent or ongoing radiation therapy -an unusual or allergic reaction to methotrexate, benzyl alcohol, other medicines, foods, dyes, or preservatives -pregnant or trying to get pregnant -breast-feeding How should I use this medicine? This drug is given as an injection into a muscle or into a vein. It may also be given into the spinal fluid. It is administered in a hospital or clinic by a specially trained health care professional. Talk to your pediatrician regarding the use of this medicine in children. While this drug may be prescribed for selected conditions, precautions do apply. Overdosage: If you think you have taken too much of this medicine contact a poison control center or emergency room at once. NOTE: This medicine is only for you. Do not share this medicine with others. What if I miss a dose? It is important not to miss your dose. Call your doctor or health care professional if you are unable  to keep an appointment. What may interact with this medicine? -antibiotics and other medicines for infections -aspirin and aspirin-like medicines including bismuth subsalicylate (Pepto-Bismol) -cisplatin -dapsone -folic acid in supplements or vitamins -mercaptopurine -NSAIDs, medicines for pain and inflammation, like ibuprofen or naproxen -pemetrexed -phenylbutazone -phenytoin -probenecid -pyrimethamine -theophylline -trimetrexate -vaccines This list may not describe all possible interactions. Give your health care provider a list of all the medicines, herbs, non-prescription drugs, or dietary supplements you use. Also tell them if you smoke, drink alcohol, or use illegal drugs. Some items may interact with your medicine. What should I watch for while using this medicine? Visit your doctor for checks on your progress. You will need to have regular blood checks during your treatment to monitor your blood, liver function, and kidney function. This drug may make you feel generally unwell. This is not uncommon, as chemotherapy can affect healthy cells as well as cancer cells. Report any side effects. Continue your course of treatment even though you feel ill unless your doctor tells you to stop. In some cases, you may be given additional medicines to help with side effects. Follow all directions for their use. Call your doctor or health care professional for advice if you get a fever, chills or sore throat, or other symptoms of a cold or flu. Do not treat yourself. This drug decreases your body's ability to fight infections. Try to avoid being around people who are sick. This medicine may increase your risk to bruise or bleed. Call your doctor or health care professional if you notice any unusual bleeding. Be careful brushing and flossing your teeth or using a toothpick because  you may get an infection or bleed more easily. If you have any dental work done, tell your dentist you are receiving  this medicine. Avoid taking products that contain aspirin, acetaminophen, ibuprofen, naproxen, or ketoprofen unless instructed by your doctor. These medicines may hide a fever. This medicine can make you more sensitive to the sun. Keep out of the sun. If you cannot avoid being in the sun, wear protective clothing and use sunscreen. Do not use sun lamps or tanning beds/booths. Do not treat diarrhea with over the counter products. Contact your doctor if you have diarrhea. To protect your kidneys, drink water or other fluids as directed while you are taking this medicine. Do not drink alcohol-containing drinks while taking this medicine. Both alcohol and the medicine may cause damage to your liver. Men and women must use effective birth control while they are taking this medicine. Do not become pregnant while taking this medicine. Women must continue using effective birth control for 1 full menstrual cycle after stopping this medicine. Tell your doctor right away if you think that you or your partner might be pregnant. There is a potential for serious side effects to an unborn child. Talk to your health care professional or pharmacist for more information. Do not breast-feed an infant while taking this medicine. Men must continue effective birth control for 3 months after stopping this medicine. What side effects may I notice from receiving this medicine? Side effects that you should report to your doctor or health care professional as soon as possible: -allergic reactions like skin rash, itching or hives, swelling of the face, lips, or tongue -low blood counts - this medicine may decrease the number of white blood cells, red blood cells and platelets. You may be at increased risk for infections and bleeding. -signs of infection - fever or chills, cough, sore throat, pain or difficulty passing urine -signs of decreased platelets or bleeding - bruising, pinpoint red spots on the skin, black, tarry stools,  blood in the urine -signs of decreased red blood cells - unusually weak or tired, fainting spells, lightheadedness -breathing problems, like a dry cough -changes in vision -confusion, not alert -diarrhea -mouth or throat sores or ulcers -problems with balance, talking, walking -redness, blistering, peeling or loosening of the skin, including inside the mouth -seizures -trouble passing urine or change in the amount of urine -vomiting -yellowing of the eyes or skin Side effects that usually do not require medical attention (report to your doctor or health care professional if they continue or are bothersome): -change in skin color -eye irritation -hair loss -headache -loss of appetite -nausea -stomach upset This list may not describe all possible side effects. Call your doctor for medical advice about side effects. You may report side effects to FDA at 1-800-FDA-1088. Where should I keep my medicine? This drug is given in a hospital or clinic and will not be stored at home. NOTE: This sheet is a summary. It may not cover all possible information. If you have questions about this medicine, talk to your doctor, pharmacist, or health care provider.  2015, [employer]. (2007-12-28 11:13:24)

## 2015-01-09 NOTE — Assessment & Plan Note (Signed)
He has chronic kidney disease and this is unrelated to the lymphoma. He did need not dose adjustment for his treatment.

## 2015-01-09 NOTE — Assessment & Plan Note (Signed)
This is likely related to recent treatment. He is not symptomatic. Will proceed with treatment without dose adjustment.

## 2015-01-09 NOTE — Progress Notes (Signed)
1520 observed puncture site on back, bandaid clean dry intact. Small possible hematoma to approx 10 o'clock position. Pt states back is not painful.

## 2015-01-09 NOTE — Assessment & Plan Note (Signed)
This is reactive in nature related to recent steroid treatment. He has no signs of infection. I will observe only.

## 2015-01-09 NOTE — Progress Notes (Signed)
[place] OFFICE PROGRESS NOTE  Patient Care Team: Harrison Mons, PA-C as PCP - General (Physician Assistant) Corliss Parish, MD as Consulting Physician (Nephrology) Fanny Skates, MD as Consulting Physician (General Surgery) Heath Lark, MD as Consulting Physician (Hematology and Oncology)  SUMMARY OF ONCOLOGIC HISTORY: Oncology History   Lymphoma   Staging form: Lymphoid Neoplasms, AJCC 6th Edition     Clinical stage from 11/21/2014: Stage IV - Signed by Heath Lark, MD on 11/21/2014       Diffuse large B cell lymphoma   11/13/2014 Imaging PEt scan showed widespread disease including possible splenic involvement   11/19/2014 Surgery He has excisional LN biopsy and port placement   11/19/2014 Pathology Results Biopsy of lymph nodes showed DLBCL   11/29/2014 -  Chemotherapy He received cycle one of RCHOP chemotherapy complicated by severe infusion reaction.   12/19/2014 -  Chemotherapy He received IT chemotherapy.    INTERVAL HISTORY: Please see below for problem oriented charting. He returns for further follow-up. He denies recent side effects of treatment. No new lymphadenopathy. Denies recent infection.  REVIEW OF SYSTEMS:   Constitutional: Denies fevers, chills or abnormal weight loss Eyes: Denies blurriness of vision Ears, nose, mouth, throat, and face: Denies mucositis or sore throat Respiratory: Denies cough, dyspnea or wheezes Cardiovascular: Denies palpitation, chest discomfort or lower extremity swelling Gastrointestinal:  Denies nausea, heartburn or change in bowel habits Skin: Denies abnormal skin rashes Lymphatics: Denies new lymphadenopathy or easy bruising Neurological:Denies numbness, tingling or new weaknesses Behavioral/Psych: Mood is stable, no new changes  All other systems were reviewed with the patient and are negative.  I have reviewed the past medical history, past surgical history, social history and family history with the patient  and they are unchanged from previous note.  ALLERGIES:  is allergic to ace inhibitors and rituximab.  MEDICATIONS:  Current Outpatient Prescriptions  Medication Sig Dispense Refill  . acetaminophen (TYLENOL) 500 MG tablet Take 500 mg by mouth daily. X 5 days after chemo    . amLODipine (NORVASC) 10 MG tablet TAKE ONE TABLET BY MOUTH ONCE DAILY 30 tablet 5  . aspirin 81 MG tablet Take 81 mg by mouth daily.    . fish oil-omega-3 fatty acids 1000 MG capsule Take 1 g by mouth daily.    Marland Kitchen glipiZIDE (GLUCOTROL XL) 10 MG 24 hr tablet TAKE ONE TABLET BY MOUTH ONCE DAILY 30 tablet 5  . glucose blood (ACCU-CHEK AVIVA PLUS) test strip Use as instructed 100 each 4  . hydrochlorothiazide (HYDRODIURIL) 25 MG tablet TAKE ONE TABLET BY MOUTH ONCE DAILY 30 tablet 5  . HYDROcodone-acetaminophen (NORCO) 5-325 MG per tablet Take 1-2 tablets by mouth every 6 (six) hours as needed. 30 tablet 0  . lidocaine-prilocaine (EMLA) cream Apply to affected area once 30 g 3  . loratadine (CLARITIN) 10 MG tablet Take 10 mg by mouth daily. 5 days after chemo    . losartan (COZAAR) 100 MG tablet Take 1 tablet (100 mg total) by mouth daily. 30 tablet 5  . ondansetron (ZOFRAN) 8 MG tablet Take 1 tablet (8 mg total) by mouth 2 (two) times daily. Start the day after chemo for 3 days. Then as needed for nausea or vomiting. 30 tablet 1  . pravastatin (PRAVACHOL) 40 MG tablet TAKE ONE TABLET BY MOUTH ONCE DAILY 30 tablet 5  . predniSONE (DELTASONE) 20 MG tablet Take 3 tablets (60 mg total) by mouth daily with breakfast. Take 3 tabs for 4 days start the day  after chemotherapy, every 21 days 12 tablet 6  . prochlorperazine (COMPAZINE) 10 MG tablet Take 1 tablet (10 mg total) by mouth every 6 (six) hours as needed (Nausea or vomiting). 30 tablet 6  . sitaGLIPtin (JANUVIA) 50 MG tablet Take 1 tablet (50 mg total) by mouth daily. 30 tablet 5   No current facility-administered medications for this visit.   Facility-Administered  Medications Ordered in Other Visits  Medication Dose Route Frequency Provider Last Rate Last Dose  . heparin lock flush 100 unit/mL  500 Units Intracatheter Once PRN Heath Lark, MD      . sodium chloride 0.9 % injection 10 mL  10 mL Intracatheter PRN Heath Lark, MD        PHYSICAL EXAMINATION: ECOG PERFORMANCE STATUS: 0 - Asymptomatic  There were no vitals filed for this visit. There were no vitals filed for this visit.  GENERAL:alert, no distress and comfortable SKIN: skin color, texture, turgor are normal, no rashes or significant lesions EYES: normal, Conjunctiva are pink and non-injected, sclera clear OROPHARYNX:no exudate, no erythema and lips, buccal mucosa, and tongue normal  NECK: supple, thyroid normal size, non-tender, without nodularity LYMPH:  no palpable lymphadenopathy in the cervical, axillary or inguinal LUNGS: clear to auscultation and percussion with normal breathing effort HEART: regular rate & rhythm and no murmurs and no lower extremity edema ABDOMEN:abdomen soft, non-tender and normal bowel sounds Musculoskeletal:no cyanosis of digits and no clubbing  NEURO: alert & oriented x 3 with fluent speech, no focal motor/sensory deficits  LABORATORY DATA:  I have reviewed the data as listed    Component Value Date/Time   NA 135* 01/09/2015 1114   NA 127* 11/23/2014 0314   NA 139 07/17/2014   K 4.2 01/09/2015 1114   K 4.4 11/23/2014 0314   CL 96* 11/23/2014 0314   CO2 22 01/09/2015 1114   CO2 19* 11/22/2014 2357   GLUCOSE 263* 01/09/2015 1114   GLUCOSE 445* 11/23/2014 0314   BUN 31.8* 01/09/2015 1114   BUN 77* 11/23/2014 0314   BUN 26* 07/17/2014   CREATININE 1.5* 01/09/2015 1114   CREATININE 2.60* 11/23/2014 0314   CREATININE 2.30* 10/31/2014 1114   CREATININE 2.1* 07/17/2014   CALCIUM 9.1 01/09/2015 1114   CALCIUM 8.4* 11/22/2014 2357   PROT 6.0* 01/09/2015 1114   PROT 6.4* 11/22/2014 2357   ALBUMIN 2.5* 01/09/2015 1114   ALBUMIN 2.8* 11/22/2014 2357    AST 27 01/09/2015 1114   AST 30 11/22/2014 2357   ALT 66* 01/09/2015 1114   ALT 45 11/22/2014 2357   ALKPHOS 115 01/09/2015 1114   ALKPHOS 85 11/22/2014 2357   BILITOT 0.28 01/09/2015 1114   BILITOT 0.8 11/22/2014 2357   GFRNONAA 26* 11/22/2014 2357   GFRNONAA 33* 09/11/2013 0920   GFRAA 30* 11/22/2014 2357   GFRAA 38* 09/11/2013 0920    No results found for: SPEP, UPEP  Lab Results  Component Value Date   WBC 30.7* 01/09/2015   NEUTROABS 29.3* 01/09/2015   HGB 10.9* 01/09/2015   HCT 32.5* 01/09/2015   MCV 82.9 01/09/2015   PLT 630* 01/09/2015      Chemistry      Component Value Date/Time   NA 135* 01/09/2015 1114   NA 127* 11/23/2014 0314   NA 139 07/17/2014   K 4.2 01/09/2015 1114   K 4.4 11/23/2014 0314   CL 96* 11/23/2014 0314   CO2 22 01/09/2015 1114   CO2 19* 11/22/2014 2357   BUN 31.8* 01/09/2015 1114  BUN 77* 11/23/2014 0314   BUN 26* 07/17/2014   CREATININE 1.5* 01/09/2015 1114   CREATININE 2.60* 11/23/2014 0314   CREATININE 2.30* 10/31/2014 1114   CREATININE 2.1* 07/17/2014   GLU 400* 12/20/2014 1438      Component Value Date/Time   CALCIUM 9.1 01/09/2015 1114   CALCIUM 8.4* 11/22/2014 2357   ALKPHOS 115 01/09/2015 1114   ALKPHOS 85 11/22/2014 2357   AST 27 01/09/2015 1114   AST 30 11/22/2014 2357   ALT 66* 01/09/2015 1114   ALT 45 11/22/2014 2357   BILITOT 0.28 01/09/2015 1114   BILITOT 0.8 11/22/2014 2357     ASSESSMENT & PLAN:  Diffuse large B cell lymphoma Clinically, he had excellent response to treatment. Per prior discussion, I will proceed with prophylactic IT administration to reduce risk of CNS relapse. He is started on  high dose prednisone today as part of his chemotherapy and also with the hope to prevent allergic reaction with R-CHOP chemotherapy tomorrow. I will see him back in 3 weeks with blood work, history, physical examination, chemotherapy and review of PET/CT scan for objective response to treatment.   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 I present time, he is not symptomatic. He can be observed and does not require blood transfusion.   Chronic kidney disease, stage III (moderate) He has chronic kidney disease and this is unrelated to the lymphoma. He did need not dose adjustment for his treatment.     Leukocytosis This is reactive in nature related to recent steroid treatment. He has no signs of infection. I will observe only.    Reactive thrombocytosis This is related to recent treatment. He is not symptomatic. Will observe.  Elevated liver enzymes This is likely related to recent treatment. He is not symptomatic. Will proceed with treatment without dose adjustment.  Intrathecal administration of chemotherapy Procedure Note   Informed consent was obtained and potential risks including bleeding, infection and pain were reviewed with the patient.  The patient's name, date of birth, identification, consent and allergies were verified prior to the start of procedure and time out was performed.  The skin was prepped with Betadine solution.   5 cc of 1% lidocaine was used to provide local anaesthesia.   The L3/L4 intrathecal space was chosen as the site of procedure.  1 cc of clear CSF was obtained.  Next, 5 ml/12 mg methotrexate was administered into the intrathecal space.  The procedure was tolerated well and there were no complications.  The patient was stable at the end of the procedure.   Orders Placed This Encounter  Procedures  . NM PET Image Restag (PS) Skull Base To Thigh    Standing Status: Future     Number of Occurrences:      Standing Expiration Date: 03/10/2016    Order Specific Question:  Reason for Exam (SYMPTOM  OR DIAGNOSIS REQUIRED)    Answer:  staging lymphoma assess response to Rx    Order Specific Question:  Preferred imaging  location?    Answer:  [facility]   All questions were answered. The patient knows to call the clinic with any problems, questions or concerns. No barriers to learning was detected. I spent 30 minutes counseling the patient face to face. The total time spent in the appointment was 40 minutes and more than 50% was on counseling and review of  test results     [facility], Joyce Heitman, MD 01/09/2015 3:01 PM

## 2015-01-09 NOTE — Assessment & Plan Note (Signed)
Clinically, he had excellent response to treatment. Per prior discussion, I will proceed with prophylactic IT administration to reduce risk of CNS relapse. He is started on  high dose prednisone today as part of his chemotherapy and also with the hope to prevent allergic reaction with R-CHOP chemotherapy tomorrow. I will see him back in 3 weeks with blood work, history, physical examination, chemotherapy and review of PET/CT scan for objective response to treatment.

## 2015-01-09 NOTE — Assessment & Plan Note (Signed)
I suspect the cause of anemia I will likely combination of anemia of chronic disease and possible bone marrow infiltration by lymphoma. I present time, he is not symptomatic. He can be observed and does not require blood transfusion.

## 2015-01-09 NOTE — Patient Instructions (Signed)

## 2015-01-10 ENCOUNTER — Ambulatory Visit (HOSPITAL_BASED_OUTPATIENT_CLINIC_OR_DEPARTMENT_OTHER): Payer: Medicare Other

## 2015-01-10 ENCOUNTER — Telehealth: Payer: Self-pay | Admitting: *Deleted

## 2015-01-10 VITALS — BP 116/58 | HR 84 | Temp 97.6°F | Resp 18

## 2015-01-10 DIAGNOSIS — C8330 Diffuse large B-cell lymphoma, unspecified site: Secondary | ICD-10-CM | POA: Diagnosis not present

## 2015-01-10 DIAGNOSIS — Z5111 Encounter for antineoplastic chemotherapy: Secondary | ICD-10-CM

## 2015-01-10 DIAGNOSIS — Z5112 Encounter for antineoplastic immunotherapy: Secondary | ICD-10-CM

## 2015-01-10 MED ORDER — DIPHENHYDRAMINE HCL 25 MG PO CAPS
50.0000 mg | ORAL_CAPSULE | Freq: Once | ORAL | Status: AC
  Administered 2015-01-10: 50 mg via ORAL

## 2015-01-10 MED ORDER — SODIUM CHLORIDE 0.9 % IV SOLN
750.0000 mg/m2 | Freq: Once | INTRAVENOUS | Status: AC
  Administered 2015-01-10: 1420 mg via INTRAVENOUS
  Filled 2015-01-10: qty 71

## 2015-01-10 MED ORDER — ACETAMINOPHEN 325 MG PO TABS
650.0000 mg | ORAL_TABLET | Freq: Once | ORAL | Status: AC
Start: 2015-01-10 — End: 2015-01-10
  Administered 2015-01-10: 650 mg via ORAL

## 2015-01-10 MED ORDER — SODIUM CHLORIDE 0.9 % IV SOLN
Freq: Once | INTRAVENOUS | Status: AC
  Administered 2015-01-10: 13:00:00 via INTRAVENOUS
  Filled 2015-01-10: qty 8

## 2015-01-10 MED ORDER — VINCRISTINE SULFATE CHEMO INJECTION 1 MG/ML
2.0000 mg | Freq: Once | INTRAVENOUS | Status: AC
  Administered 2015-01-10: 2 mg via INTRAVENOUS
  Filled 2015-01-10: qty 2

## 2015-01-10 MED ORDER — ACETAMINOPHEN 325 MG PO TABS
ORAL_TABLET | ORAL | Status: AC
  Filled 2015-01-10: qty 2

## 2015-01-10 MED ORDER — HEPARIN SOD (PORK) LOCK FLUSH 100 UNIT/ML IV SOLN
500.0000 [IU] | Freq: Once | INTRAVENOUS | Status: AC | PRN
  Administered 2015-01-10: 500 [IU]
  Filled 2015-01-10: qty 5

## 2015-01-10 MED ORDER — DIPHENHYDRAMINE HCL 25 MG PO CAPS
ORAL_CAPSULE | ORAL | Status: AC
  Filled 2015-01-10: qty 2

## 2015-01-10 MED ORDER — SODIUM CHLORIDE 0.9 % IJ SOLN
10.0000 mL | INTRAMUSCULAR | Status: DC | PRN
  Administered 2015-01-10: 10 mL
  Filled 2015-01-10: qty 10

## 2015-01-10 MED ORDER — DOXORUBICIN HCL CHEMO IV INJECTION 2 MG/ML
50.0000 mg/m2 | Freq: Once | INTRAVENOUS | Status: AC
  Administered 2015-01-10: 96 mg via INTRAVENOUS
  Filled 2015-01-10: qty 48

## 2015-01-10 MED ORDER — SODIUM CHLORIDE 0.9 % IV SOLN
375.0000 mg/m2 | Freq: Once | INTRAVENOUS | Status: AC
  Administered 2015-01-10: 700 mg via INTRAVENOUS
  Filled 2015-01-10: qty 70

## 2015-01-10 MED ORDER — SODIUM CHLORIDE 0.9 % IV SOLN
Freq: Once | INTRAVENOUS | Status: AC
  Administered 2015-01-10: 12:00:00 via INTRAVENOUS

## 2015-01-10 NOTE — Progress Notes (Signed)
Blood return noted before, during and after Adriamycin.

## 2015-01-10 NOTE — Patient Instructions (Addendum)
[place] Discharge Instructions for Patients Receiving Chemotherapy  Today you received the following chemotherapy agents Adriamycin, Vincristine, Cytoxan and Rituxan  To help prevent nausea and vomiting after your treatment, we encourage you to take your nausea medication: Zofran. Take one every 8 hours as needed. Take Compazine every 6 hours if needed. (This will make you drowsy.)   If you develop nausea and vomiting that is not controlled by your nausea medication, call the clinic.   BELOW ARE SYMPTOMS THAT SHOULD BE REPORTED IMMEDIATELY:  *FEVER GREATER THAN 100.5 F  *CHILLS WITH OR WITHOUT FEVER  NAUSEA AND VOMITING THAT IS NOT CONTROLLED WITH YOUR NAUSEA MEDICATION  *UNUSUAL SHORTNESS OF BREATH  *UNUSUAL BRUISING OR BLEEDING  TENDERNESS IN MOUTH AND THROAT WITH OR WITHOUT PRESENCE OF ULCERS  *URINARY PROBLEMS  *BOWEL PROBLEMS  UNUSUAL RASH Items with * indicate a potential emergency and should be followed up as soon as possible.  Feel free to call the clinic should you have any questions or concerns. The clinic phone number is [phone].  Please show the [facility] at check-in to the Emergency Department and triage nurse.

## 2015-01-10 NOTE — Telephone Encounter (Signed)
Per staff message and POF I have scheduled appts. Advised scheduler of appts. JMW  

## 2015-01-13 ENCOUNTER — Ambulatory Visit (HOSPITAL_BASED_OUTPATIENT_CLINIC_OR_DEPARTMENT_OTHER): Payer: Medicare Other

## 2015-01-13 VITALS — BP 137/64 | HR 97 | Temp 97.9°F

## 2015-01-13 DIAGNOSIS — Z5189 Encounter for other specified aftercare: Secondary | ICD-10-CM | POA: Diagnosis not present

## 2015-01-13 DIAGNOSIS — C8330 Diffuse large B-cell lymphoma, unspecified site: Secondary | ICD-10-CM

## 2015-01-13 MED ORDER — PEGFILGRASTIM INJECTION 6 MG/0.6ML ~~LOC~~
6.0000 mg | PREFILLED_SYRINGE | Freq: Once | SUBCUTANEOUS | Status: AC
  Administered 2015-01-13: 6 mg via SUBCUTANEOUS
  Filled 2015-01-13: qty 0.6

## 2015-01-28 ENCOUNTER — Ambulatory Visit (HOSPITAL_COMMUNITY): Payer: Medicare Other

## 2015-01-28 ENCOUNTER — Encounter (HOSPITAL_COMMUNITY)
Admission: RE | Admit: 2015-01-28 | Discharge: 2015-01-28 | Disposition: A | Discharge status: Home / Self Care | Payer: Medicare Other | Source: Ambulatory Visit | Attending: Hematology and Oncology | Admitting: Hematology and Oncology

## 2015-01-28 DIAGNOSIS — C8330 Diffuse large B-cell lymphoma, unspecified site: Secondary | ICD-10-CM | POA: Diagnosis present

## 2015-01-28 LAB — GLUCOSE, CAPILLARY
GLUCOSE-CAPILLARY: 303 mg/dL — AB (ref 65–99)
Glucose-Capillary: 293 mg/dL — ABNORMAL HIGH (ref 65–99)

## 2015-01-31 ENCOUNTER — Other Ambulatory Visit: Payer: Self-pay | Admitting: Medical Oncology

## 2015-01-31 ENCOUNTER — Ambulatory Visit (HOSPITAL_BASED_OUTPATIENT_CLINIC_OR_DEPARTMENT_OTHER): Payer: Medicare Other | Admitting: Hematology and Oncology

## 2015-01-31 ENCOUNTER — Other Ambulatory Visit: Payer: Self-pay | Admitting: Hematology and Oncology

## 2015-01-31 ENCOUNTER — Telehealth: Payer: Self-pay | Admitting: Hematology and Oncology

## 2015-01-31 ENCOUNTER — Ambulatory Visit (HOSPITAL_BASED_OUTPATIENT_CLINIC_OR_DEPARTMENT_OTHER): Payer: Medicare Other

## 2015-01-31 ENCOUNTER — Ambulatory Visit: Payer: Medicare Other

## 2015-01-31 ENCOUNTER — Other Ambulatory Visit (HOSPITAL_BASED_OUTPATIENT_CLINIC_OR_DEPARTMENT_OTHER): Payer: Medicare Other

## 2015-01-31 VITALS — BP 110/56 | HR 86 | Temp 96.7°F | Resp 18

## 2015-01-31 VITALS — BP 129/65 | HR 18 | Temp 97.6°F | Resp 18 | Ht 70.0 in | Wt 139.5 lb

## 2015-01-31 DIAGNOSIS — E1129 Type 2 diabetes mellitus with other diabetic kidney complication: Secondary | ICD-10-CM

## 2015-01-31 DIAGNOSIS — E1165 Type 2 diabetes mellitus with hyperglycemia: Secondary | ICD-10-CM

## 2015-01-31 DIAGNOSIS — E1122 Type 2 diabetes mellitus with diabetic chronic kidney disease: Secondary | ICD-10-CM

## 2015-01-31 DIAGNOSIS — D630 Anemia in neoplastic disease: Secondary | ICD-10-CM

## 2015-01-31 DIAGNOSIS — C8330 Diffuse large B-cell lymphoma, unspecified site: Secondary | ICD-10-CM | POA: Diagnosis not present

## 2015-01-31 DIAGNOSIS — R739 Hyperglycemia, unspecified: Secondary | ICD-10-CM

## 2015-01-31 DIAGNOSIS — Z5112 Encounter for antineoplastic immunotherapy: Secondary | ICD-10-CM

## 2015-01-31 DIAGNOSIS — D72829 Elevated white blood cell count, unspecified: Secondary | ICD-10-CM

## 2015-01-31 DIAGNOSIS — R7989 Other specified abnormal findings of blood chemistry: Secondary | ICD-10-CM | POA: Diagnosis not present

## 2015-01-31 DIAGNOSIS — D75838 Other thrombocytosis: Secondary | ICD-10-CM

## 2015-01-31 DIAGNOSIS — N183 Chronic kidney disease, stage 3 unspecified: Secondary | ICD-10-CM

## 2015-01-31 DIAGNOSIS — Z95828 Presence of other vascular implants and grafts: Secondary | ICD-10-CM

## 2015-01-31 DIAGNOSIS — Z5111 Encounter for antineoplastic chemotherapy: Secondary | ICD-10-CM | POA: Diagnosis not present

## 2015-01-31 DIAGNOSIS — C8590 Non-Hodgkin lymphoma, unspecified, unspecified site: Secondary | ICD-10-CM

## 2015-01-31 LAB — COMPREHENSIVE METABOLIC PANEL (CC13)
ALK PHOS: 140 U/L (ref 40–150)
ALT: 58 U/L — ABNORMAL HIGH (ref 0–55)
ANION GAP: 9 meq/L (ref 3–11)
AST: 16 U/L (ref 5–34)
Albumin: 2.5 g/dL — ABNORMAL LOW (ref 3.5–5.0)
BILIRUBIN TOTAL: 0.32 mg/dL (ref 0.20–1.20)
BUN: 46.9 mg/dL — AB (ref 7.0–26.0)
CALCIUM: 8.8 mg/dL (ref 8.4–10.4)
CO2: 27 mEq/L (ref 22–29)
CREATININE: 1.9 mg/dL — AB (ref 0.7–1.3)
Chloride: 94 mEq/L — ABNORMAL LOW (ref 98–109)
EGFR: 42 mL/min/{1.73_m2} — ABNORMAL LOW (ref >90)
GLUCOSE: 413 mg/dL — AB (ref 70–140)
Potassium: 4.2 mEq/L (ref 3.5–5.1)
Sodium: 130 mEq/L — ABNORMAL LOW (ref 136–145)
Total Protein: 5.7 g/dL — ABNORMAL LOW (ref 6.4–8.3)

## 2015-01-31 LAB — WHOLE BLOOD GLUCOSE
GLUCOSE: 452 mg/dL — AB (ref 70–100)
Glucose: 408 mg/dL — ABNORMAL HIGH (ref 70–100)
Glucose: 382 mg/dL — ABNORMAL HIGH (ref 70–100)
HRS PC: 1 h
HRS PC: 2 Hours
HRS PC: 3 Hours

## 2015-01-31 LAB — CBC WITH DIFFERENTIAL/PLATELET
BASO%: 0.1 % (ref 0.0–2.0)
Basophils Absolute: 0.1 10*3/uL (ref 0.0–0.1)
EOS ABS: 0 10*3/uL (ref 0.0–0.5)
EOS%: 0 % (ref 0.0–7.0)
HCT: 28 % — ABNORMAL LOW (ref 38.4–49.9)
HGB: 9.5 g/dL — ABNORMAL LOW (ref 13.0–17.1)
LYMPH%: 2.9 % — AB (ref 14.0–49.0)
MCH: 27.7 pg (ref 27.2–33.4)
MCHC: 33.9 g/dL (ref 32.0–36.0)
MCV: 81.6 fL (ref 79.3–98.0)
MONO#: 1.7 10*3/uL — AB (ref 0.1–0.9)
MONO%: 3.7 % (ref 0.0–14.0)
NEUT#: 42.4 10*3/uL — ABNORMAL HIGH (ref 1.5–6.5)
NEUT%: 93.3 % — ABNORMAL HIGH (ref 39.0–75.0)
Platelets: 645 10*3/uL — ABNORMAL HIGH (ref 140–400)
RBC: 3.43 10*6/uL — AB (ref 4.20–5.82)
RDW: 16 % — ABNORMAL HIGH (ref 11.0–14.6)
WBC: 45.5 10*3/uL — ABNORMAL HIGH (ref 4.0–10.3)
lymph#: 1.3 10*3/uL (ref 0.9–3.3)

## 2015-01-31 MED ORDER — SODIUM CHLORIDE 0.9 % IV SOLN
Freq: Once | INTRAVENOUS | Status: AC
  Administered 2015-01-31: 12:00:00 via INTRAVENOUS
  Filled 2015-01-31: qty 8

## 2015-01-31 MED ORDER — SODIUM CHLORIDE 0.9 % IV SOLN
375.0000 mg/m2 | Freq: Once | INTRAVENOUS | Status: AC
  Administered 2015-01-31: 700 mg via INTRAVENOUS
  Filled 2015-01-31: qty 70

## 2015-01-31 MED ORDER — INSULIN REGULAR HUMAN 100 UNIT/ML IJ SOLN
12.0000 [IU] | Freq: Once | INTRAMUSCULAR | Status: AC
  Administered 2015-01-31: 12 [IU] via SUBCUTANEOUS
  Filled 2015-01-31: qty 0.12

## 2015-01-31 MED ORDER — SODIUM CHLORIDE 0.9 % IV SOLN
Freq: Once | INTRAVENOUS | Status: AC
  Administered 2015-01-31: 12:00:00 via INTRAVENOUS

## 2015-01-31 MED ORDER — DIPHENHYDRAMINE HCL 25 MG PO CAPS
ORAL_CAPSULE | ORAL | Status: AC
  Filled 2015-01-31: qty 2

## 2015-01-31 MED ORDER — ACETAMINOPHEN 325 MG PO TABS
ORAL_TABLET | ORAL | Status: AC
Start: 2015-01-31 — End: 2015-01-31
  Filled 2015-01-31: qty 2

## 2015-01-31 MED ORDER — HEPARIN SOD (PORK) LOCK FLUSH 100 UNIT/ML IV SOLN
500.0000 [IU] | Freq: Once | INTRAVENOUS | Status: AC | PRN
  Administered 2015-01-31: 500 [IU]
  Filled 2015-01-31: qty 5

## 2015-01-31 MED ORDER — CYCLOPHOSPHAMIDE CHEMO INJECTION 1 GM
675.0000 mg/m2 | Freq: Once | INTRAMUSCULAR | Status: AC
  Administered 2015-01-31: 1280 mg via INTRAVENOUS
  Filled 2015-01-31: qty 64

## 2015-01-31 MED ORDER — INSULIN REGULAR HUMAN 100 UNIT/ML IJ SOLN
18.0000 [IU] | Freq: Once | INTRAMUSCULAR | Status: AC
  Administered 2015-01-31: 18 [IU] via SUBCUTANEOUS
  Filled 2015-01-31: qty 0.18

## 2015-01-31 MED ORDER — SODIUM CHLORIDE 0.9 % IJ SOLN
10.0000 mL | INTRAMUSCULAR | Status: DC | PRN
  Administered 2015-01-31: 10 mL
  Filled 2015-01-31: qty 10

## 2015-01-31 MED ORDER — VINCRISTINE SULFATE CHEMO INJECTION 1 MG/ML
1.0000 mg | Freq: Once | INTRAVENOUS | Status: AC
  Administered 2015-01-31: 1 mg via INTRAVENOUS
  Filled 2015-01-31: qty 1

## 2015-01-31 MED ORDER — DIPHENHYDRAMINE HCL 25 MG PO CAPS
50.0000 mg | ORAL_CAPSULE | Freq: Once | ORAL | Status: AC
  Administered 2015-01-31: 50 mg via ORAL

## 2015-01-31 MED ORDER — ACETAMINOPHEN 325 MG PO TABS
650.0000 mg | ORAL_TABLET | Freq: Once | ORAL | Status: AC
  Administered 2015-01-31: 650 mg via ORAL

## 2015-01-31 MED ORDER — INSULIN REGULAR HUMAN 100 UNIT/ML IJ SOLN
12.0000 [IU] | Freq: Once | INTRAMUSCULAR | Status: DC
  Filled 2015-01-31: qty 0.12

## 2015-01-31 MED ORDER — INSULIN REGULAR HUMAN 100 UNIT/ML IJ SOLN
15.0000 [IU] | Freq: Once | INTRAMUSCULAR | Status: AC
  Administered 2015-01-31: 13:00:00 via SUBCUTANEOUS
  Filled 2015-01-31: qty 0.15

## 2015-01-31 MED ORDER — SODIUM CHLORIDE 0.9 % IJ SOLN
10.0000 mL | INTRAMUSCULAR | Status: DC | PRN
Start: 2015-01-31 — End: 2015-01-31
  Administered 2015-01-31: 10 mL via INTRAVENOUS
  Filled 2015-01-31: qty 10

## 2015-01-31 MED ORDER — DOXORUBICIN HCL CHEMO IV INJECTION 2 MG/ML
45.0000 mg/m2 | Freq: Once | INTRAVENOUS | Status: AC
  Administered 2015-01-31: 86 mg via INTRAVENOUS
  Filled 2015-01-31: qty 43

## 2015-01-31 NOTE — Patient Instructions (Signed)

## 2015-01-31 NOTE — Assessment & Plan Note (Signed)
He has poorly controlled diabetes. I will reduce the dose of his prednisone, dexamethasone and chemotherapy due to recent weight loss. His PET/CT scan was canceled and rescheduled due to hyperglycemia. Clinically, he appears to be responding to treatment. I will see him back in 3 weeks with results of PET scan

## 2015-01-31 NOTE — Assessment & Plan Note (Signed)
His blood sugar is poorly controlled. I will reduce the dose of dexamethasone and prednisone and will cover him with insulin today.

## 2015-01-31 NOTE — Assessment & Plan Note (Signed)
I suspect the cause of anemia I will likely combination of anemia of chronic disease and possible bone marrow infiltration by lymphoma. I present time, he is not symptomatic. He can be observed and does not require blood transfusion.

## 2015-01-31 NOTE — Progress Notes (Signed)
OK to treat today with Creatinine 1.9 per Dr. Alvy Bimler.   Give Regular insulin 12 units now and check CBG in one hour.

## 2015-01-31 NOTE — Telephone Encounter (Signed)
Gave adn printed appt sched and avs for pt for Aug

## 2015-01-31 NOTE — Assessment & Plan Note (Addendum)
This is reactive in nature related to recent steroid & GCSF treatment. He has no signs of infection. I will observe only.

## 2015-01-31 NOTE — Assessment & Plan Note (Signed)
He has chronic kidney disease and this is unrelated to the lymphoma. He did need not dose adjustment for his treatment.

## 2015-01-31 NOTE — Progress Notes (Signed)
[place] OFFICE PROGRESS NOTE  Patient Care Team: Harrison Mons, PA-C as PCP - General (Physician Assistant) Corliss Parish, MD as Consulting Physician (Nephrology) Fanny Skates, MD as Consulting Physician (General Surgery) Heath Lark, MD as Consulting Physician (Hematology and Oncology)  SUMMARY OF ONCOLOGIC HISTORY: Oncology History   Lymphoma   Staging form: Lymphoid Neoplasms, AJCC 6th Edition     Clinical stage from 11/21/2014: Stage IV - Signed by Heath Lark, MD on 11/21/2014       Diffuse large B cell lymphoma   11/13/2014 Imaging PEt scan showed widespread disease including possible splenic involvement   11/19/2014 Surgery He has excisional LN biopsy and port placement   11/19/2014 Pathology Results Biopsy of lymph nodes showed DLBCL   11/29/2014 -  Chemotherapy He received cycle one of RCHOP chemotherapy complicated by severe infusion reaction.   12/19/2014 -  Chemotherapy He received IT chemotherapy.   01/31/2015 Adverse Reaction I reduce the dose of his chemotherapy, prednisone and dexamethasone due to poorly controlled diabetes and hyperglycemia    INTERVAL HISTORY: Please see below for problem oriented charting. He is seen prior to cycle 4 of treatment. His PET/CT scan was counseled and reschedule due to uncontrolled hyperglycemia. He tolerated treatment well. He has very mild peripheral neuropathy. Denies nausea, vomiting or mucositis. He has poorly controlled diabetes and had lots some weight.  REVIEW OF SYSTEMS:   Constitutional: Denies fevers, chills  Eyes: Denies blurriness of vision Ears, nose, mouth, throat, and face: Denies mucositis or sore throat Respiratory: Denies cough, dyspnea or wheezes Cardiovascular: Denies palpitation, chest discomfort or lower extremity swelling Gastrointestinal:  Denies nausea, heartburn or change in bowel habits Skin: Denies abnormal skin rashes Lymphatics: Denies new lymphadenopathy or easy  bruising Neurological:Denies numbness, tingling or new weaknesses Behavioral/Psych: Mood is stable, no new changes  All other systems were reviewed with the patient and are negative.  I have reviewed the past medical history, past surgical history, social history and family history with the patient and they are unchanged from previous note.  ALLERGIES:  is allergic to ace inhibitors and rituximab.  MEDICATIONS:  Current Outpatient Prescriptions  Medication Sig Dispense Refill  . acetaminophen (TYLENOL) 500 MG tablet Take 500 mg by mouth daily. X 5 days after chemo    . amLODipine (NORVASC) 10 MG tablet TAKE ONE TABLET BY MOUTH ONCE DAILY 30 tablet 5  . aspirin 81 MG tablet Take 81 mg by mouth daily.    . fish oil-omega-3 fatty acids 1000 MG capsule Take 1 g by mouth daily.    Marland Kitchen glipiZIDE (GLUCOTROL XL) 10 MG 24 hr tablet TAKE ONE TABLET BY MOUTH ONCE DAILY 30 tablet 5  . glucose blood (ACCU-CHEK AVIVA PLUS) test strip Use as instructed 100 each 4  . hydrochlorothiazide (HYDRODIURIL) 25 MG tablet TAKE ONE TABLET BY MOUTH ONCE DAILY 30 tablet 5  . HYDROcodone-acetaminophen (NORCO) 5-325 MG per tablet Take 1-2 tablets by mouth every 6 (six) hours as needed. 30 tablet 0  . lidocaine-prilocaine (EMLA) cream Apply to affected area once 30 g 3  . loratadine (CLARITIN) 10 MG tablet Take 10 mg by mouth daily. 5 days after chemo    . losartan (COZAAR) 100 MG tablet Take 1 tablet (100 mg total) by mouth daily. 30 tablet 5  . ondansetron (ZOFRAN) 8 MG tablet Take 1 tablet (8 mg total) by mouth 2 (two) times daily. Start the day after chemo for 3 days. Then as needed for nausea or vomiting. [place]  tablet 1  . pravastatin (PRAVACHOL) 40 MG tablet TAKE ONE TABLET BY MOUTH ONCE DAILY 30 tablet 5  . predniSONE (DELTASONE) 20 MG tablet Take 3 tablets (60 mg total) by mouth daily with breakfast. Take 3 tabs for 4 days start the day after chemotherapy, every 21 days 12 tablet 6  . prochlorperazine (COMPAZINE) 10  MG tablet Take 1 tablet (10 mg total) by mouth every 6 (six) hours as needed (Nausea or vomiting). 30 tablet 6  . sitaGLIPtin (JANUVIA) 50 MG tablet Take 1 tablet (50 mg total) by mouth daily. 30 tablet 5   No current facility-administered medications for this visit.   Facility-Administered Medications Ordered in Other Visits  Medication Dose Route Frequency Provider Last Rate Last Dose  . acetaminophen (TYLENOL) tablet 650 mg  650 mg Oral Once Heath Lark, MD      . cyclophosphamide (CYTOXAN) 1,280 mg in sodium chloride 0.9 % 250 mL chemo infusion  675 mg/m2 (Treatment Plan Actual) Intravenous Once Heath Lark, MD      . diphenhydrAMINE (BENADRYL) capsule 50 mg  50 mg Oral Once Heath Lark, MD      . DOXOrubicin (ADRIAMYCIN) chemo injection 86 mg  45 mg/m2 (Treatment Plan Actual) Intravenous Once Heath Lark, MD      . heparin lock flush 100 unit/mL  500 Units Intracatheter Once PRN Heath Lark, MD      . insulin regular (NOVOLIN R,HUMULIN R) 100 units/mL injection 12 Units  12 Units Subcutaneous Once Heath Lark, MD   12 Units at 01/31/15 1158  . ondansetron (ZOFRAN) 16 mg, dexamethasone (DECADRON) 10 mg in sodium chloride 0.9 % 50 mL IVPB   Intravenous Once Heath Lark, MD      . riTUXimab (RITUXAN) 700 mg in sodium chloride 0.9 % 250 mL (2.1875 mg/mL) chemo infusion  375 mg/m2 (Treatment Plan Actual) Intravenous Once Heath Lark, MD      . sodium chloride 0.9 % injection 10 mL  10 mL Intracatheter PRN Heath Lark, MD      . vinCRIStine (ONCOVIN) 1 mg in sodium chloride 0.9 % 50 mL chemo infusion  1 mg Intravenous Once Heath Lark, MD        PHYSICAL EXAMINATION: ECOG PERFORMANCE STATUS: 1 - Symptomatic but completely ambulatory  Filed Vitals:   01/31/15 1056  BP: 129/65  Pulse: 18  Temp: 97.6 F (36.4 C)  Resp: 18   Filed Weights   01/31/15 1056  Weight: 139 lb 8 oz (63.277 kg)    GENERAL:alert, no distress and comfortable SKIN: skin color, texture, turgor are normal, no rashes or  significant lesions EYES: normal, Conjunctiva are pink and non-injected, sclera clear OROPHARYNX:no exudate, no erythema and lips, buccal mucosa, and tongue normal  NECK: supple, thyroid normal size, non-tender, without nodularity LYMPH:  no palpable lymphadenopathy in the cervical, axillary or inguinal LUNGS: clear to auscultation and percussion with normal breathing effort HEART: regular rate & rhythm and no murmurs and no lower extremity edema ABDOMEN:abdomen soft, non-tender and normal bowel sounds Musculoskeletal:no cyanosis of digits and no clubbing  NEURO: alert & oriented x 3 with fluent speech, no focal motor/sensory deficits  LABORATORY DATA:  I have reviewed the data as listed    Component Value Date/Time   NA 130* 01/31/2015 1027   NA 127* 11/23/2014 0314   NA 139 07/17/2014   K 4.2 01/31/2015 1027   K 4.4 11/23/2014 0314   CL 96* 11/23/2014 0314   CO2 27 01/31/2015 1027   CO2  19* 11/22/2014 2357   GLUCOSE 413* 01/31/2015 1027   GLUCOSE 445* 11/23/2014 0314   BUN 46.9* 01/31/2015 1027   BUN 77* 11/23/2014 0314   BUN 26* 07/17/2014   CREATININE 1.9* 01/31/2015 1027   CREATININE 2.60* 11/23/2014 0314   CREATININE 2.30* 10/31/2014 1114   CREATININE 2.1* 07/17/2014   CALCIUM 8.8 01/31/2015 1027   CALCIUM 8.4* 11/22/2014 2357   PROT 5.7* 01/31/2015 1027   PROT 6.4* 11/22/2014 2357   ALBUMIN 2.5* 01/31/2015 1027   ALBUMIN 2.8* 11/22/2014 2357   AST 16 01/31/2015 1027   AST 30 11/22/2014 2357   ALT 58* 01/31/2015 1027   ALT 45 11/22/2014 2357   ALKPHOS 140 01/31/2015 1027   ALKPHOS 85 11/22/2014 2357   BILITOT 0.32 01/31/2015 1027   BILITOT 0.8 11/22/2014 2357   GFRNONAA 26* 11/22/2014 2357   GFRNONAA 33* 09/11/2013 0920   GFRAA 30* 11/22/2014 2357   GFRAA 38* 09/11/2013 0920    No results found for: SPEP, UPEP  Lab Results  Component Value Date   WBC 45.5* 01/31/2015   NEUTROABS 42.4* 01/31/2015   HGB 9.5* 01/31/2015   HCT 28.0* 01/31/2015   MCV 81.6  01/31/2015   PLT 645* 01/31/2015      Chemistry      Component Value Date/Time   NA 130* 01/31/2015 1027   NA 127* 11/23/2014 0314   NA 139 07/17/2014   K 4.2 01/31/2015 1027   K 4.4 11/23/2014 0314   CL 96* 11/23/2014 0314   CO2 27 01/31/2015 1027   CO2 19* 11/22/2014 2357   BUN 46.9* 01/31/2015 1027   BUN 77* 11/23/2014 0314   BUN 26* 07/17/2014   CREATININE 1.9* 01/31/2015 1027   CREATININE 2.60* 11/23/2014 0314   CREATININE 2.30* 10/31/2014 1114   CREATININE 2.1* 07/17/2014   GLU 400* 12/20/2014 1438      Component Value Date/Time   CALCIUM 8.8 01/31/2015 1027   CALCIUM 8.4* 11/22/2014 2357   ALKPHOS 140 01/31/2015 1027   ALKPHOS 85 11/22/2014 2357   AST 16 01/31/2015 1027   AST 30 11/22/2014 2357   ALT 58* 01/31/2015 1027   ALT 45 11/22/2014 2357   BILITOT 0.32 01/31/2015 1027   BILITOT 0.8 11/22/2014 2357     ASSESSMENT & PLAN:  Diffuse large B cell lymphoma He has poorly controlled diabetes. I will reduce the dose of his prednisone, dexamethasone and chemotherapy due to recent weight loss. His PET/CT scan was canceled and rescheduled due to hyperglycemia. Clinically, he appears to be responding to treatment. I will see him back in 3 weeks with results of PET scan  Anemia in neoplastic disease I suspect the cause of anemia I will likely combination of anemia of chronic disease and possible bone marrow infiltration by lymphoma. I present time, he is not symptomatic. He can be observed and does not require blood transfusion.     Leukocytosis This is reactive in nature related to recent steroid & GCSF treatment. He has no signs of infection. I will observe only.   Reactive thrombocytosis This is related to recent treatment. He is not symptomatic. Will observe.    Chronic kidney disease, stage III (moderate) He has chronic kidney disease and this is unrelated to the lymphoma. He did need not dose adjustment for his treatment.     DM (diabetes  mellitus), type 2 with renal complications His blood sugar is poorly controlled. I will reduce the dose of dexamethasone and prednisone and will cover him with  insulin today.   Orders Placed This Encounter  Procedures  . Glucose, whole blood (CBG)    Standing Status: Future     Number of Occurrences:      Standing Expiration Date: 01/31/2016   All questions were answered. The patient knows to call the clinic with any problems, questions or concerns. No barriers to learning was detected. I spent 30 minutes counseling the patient face to face. The total time spent in the appointment was 40 minutes and more than 50% was on counseling and review of test results     [facility], [place], MD 01/31/2015 12:03 PM

## 2015-01-31 NOTE — Patient Instructions (Signed)
[facility] Discharge Instructions for Patients Receiving Chemotherapy  Today you received the following chemotherapy agents Adriamycin/Vincristine/Cytoxan/Rituxan To help prevent nausea and vomiting after your treatment, we encourage you to take your nausea medication as prescribed.   If you develop nausea and vomiting that is not controlled by your nausea medication, call the clinic.   BELOW ARE SYMPTOMS THAT SHOULD BE REPORTED IMMEDIATELY:  *FEVER GREATER THAN 100.5 F  *CHILLS WITH OR WITHOUT FEVER  NAUSEA AND VOMITING THAT IS NOT CONTROLLED WITH YOUR NAUSEA MEDICATION  *UNUSUAL SHORTNESS OF BREATH  *UNUSUAL BRUISING OR BLEEDING  TENDERNESS IN MOUTH AND THROAT WITH OR WITHOUT PRESENCE OF ULCERS  *URINARY PROBLEMS  *BOWEL PROBLEMS  UNUSUAL RASH Items with * indicate a potential emergency and should be followed up as soon as possible.  Feel free to call the clinic you have any questions or concerns. The clinic phone number is [phone].  Please show the CHEMO ALERT CARD at check-in to the Emergency Department and triage nurse.    

## 2015-01-31 NOTE — Assessment & Plan Note (Signed)
This is related to recent treatment. He is not symptomatic. Will observe.

## 2015-02-03 ENCOUNTER — Ambulatory Visit (HOSPITAL_BASED_OUTPATIENT_CLINIC_OR_DEPARTMENT_OTHER): Payer: Medicare Other

## 2015-02-03 VITALS — BP 126/57 | HR 99 | Temp 98.5°F

## 2015-02-03 DIAGNOSIS — Z5189 Encounter for other specified aftercare: Secondary | ICD-10-CM | POA: Diagnosis not present

## 2015-02-03 DIAGNOSIS — C8330 Diffuse large B-cell lymphoma, unspecified site: Secondary | ICD-10-CM

## 2015-02-03 MED ORDER — PEGFILGRASTIM INJECTION 6 MG/0.6ML ~~LOC~~
6.0000 mg | PREFILLED_SYRINGE | Freq: Once | SUBCUTANEOUS | Status: AC
  Administered 2015-02-03: 6 mg via SUBCUTANEOUS
  Filled 2015-02-03: qty 0.6

## 2015-02-12 ENCOUNTER — Ambulatory Visit (HOSPITAL_COMMUNITY)
Admission: RE | Admit: 2015-02-12 | Discharge: 2015-02-12 | Disposition: A | Discharge status: Home / Self Care | Payer: Medicare Other | Source: Ambulatory Visit | Attending: Hematology and Oncology | Admitting: Hematology and Oncology

## 2015-02-12 DIAGNOSIS — I700 Atherosclerosis of aorta: Secondary | ICD-10-CM | POA: Insufficient documentation

## 2015-02-12 DIAGNOSIS — J432 Centrilobular emphysema: Secondary | ICD-10-CM | POA: Insufficient documentation

## 2015-02-12 DIAGNOSIS — I313 Pericardial effusion (noninflammatory): Secondary | ICD-10-CM | POA: Insufficient documentation

## 2015-02-12 DIAGNOSIS — C8330 Diffuse large B-cell lymphoma, unspecified site: Secondary | ICD-10-CM | POA: Insufficient documentation

## 2015-02-12 LAB — GLUCOSE, CAPILLARY: Glucose-Capillary: 57 mg/dL — ABNORMAL LOW (ref 65–99)

## 2015-02-12 MED ORDER — FLUDEOXYGLUCOSE F - 18 (FDG) INJECTION
6.7000 | Freq: Once | INTRAVENOUS | Status: DC | PRN
  Administered 2015-02-12: 6.7 via INTRAVENOUS
  Filled 2015-02-12: qty 6.7

## 2015-02-20 ENCOUNTER — Telehealth: Payer: Self-pay | Admitting: Hematology and Oncology

## 2015-02-20 ENCOUNTER — Ambulatory Visit: Payer: Medicare Other | Admitting: Hematology and Oncology

## 2015-02-20 ENCOUNTER — Other Ambulatory Visit (HOSPITAL_BASED_OUTPATIENT_CLINIC_OR_DEPARTMENT_OTHER): Payer: Medicare Other

## 2015-02-20 ENCOUNTER — Other Ambulatory Visit: Payer: Medicare Other

## 2015-02-20 ENCOUNTER — Ambulatory Visit (HOSPITAL_BASED_OUTPATIENT_CLINIC_OR_DEPARTMENT_OTHER): Payer: Medicare Other | Admitting: Hematology and Oncology

## 2015-02-20 ENCOUNTER — Encounter: Payer: Self-pay | Admitting: Hematology and Oncology

## 2015-02-20 ENCOUNTER — Ambulatory Visit (HOSPITAL_BASED_OUTPATIENT_CLINIC_OR_DEPARTMENT_OTHER): Payer: Medicare Other

## 2015-02-20 VITALS — BP 124/58 | HR 95 | Temp 97.5°F | Resp 18 | Ht 70.0 in | Wt 140.5 lb

## 2015-02-20 DIAGNOSIS — N183 Chronic kidney disease, stage 3 unspecified: Secondary | ICD-10-CM

## 2015-02-20 DIAGNOSIS — C8590 Non-Hodgkin lymphoma, unspecified, unspecified site: Secondary | ICD-10-CM

## 2015-02-20 DIAGNOSIS — C8334 Diffuse large B-cell lymphoma, lymph nodes of axilla and upper limb: Secondary | ICD-10-CM | POA: Diagnosis not present

## 2015-02-20 DIAGNOSIS — Z452 Encounter for adjustment and management of vascular access device: Secondary | ICD-10-CM

## 2015-02-20 DIAGNOSIS — E1122 Type 2 diabetes mellitus with diabetic chronic kidney disease: Secondary | ICD-10-CM

## 2015-02-20 DIAGNOSIS — D630 Anemia in neoplastic disease: Secondary | ICD-10-CM | POA: Diagnosis not present

## 2015-02-20 DIAGNOSIS — D72829 Elevated white blood cell count, unspecified: Secondary | ICD-10-CM

## 2015-02-20 DIAGNOSIS — C8338 Diffuse large B-cell lymphoma, lymph nodes of multiple sites: Secondary | ICD-10-CM | POA: Diagnosis not present

## 2015-02-20 DIAGNOSIS — Z95828 Presence of other vascular implants and grafts: Secondary | ICD-10-CM

## 2015-02-20 DIAGNOSIS — C8330 Diffuse large B-cell lymphoma, unspecified site: Secondary | ICD-10-CM

## 2015-02-20 DIAGNOSIS — R918 Other nonspecific abnormal finding of lung field: Secondary | ICD-10-CM

## 2015-02-20 HISTORY — DX: Other nonspecific abnormal finding of lung field: R91.8

## 2015-02-20 LAB — COMPREHENSIVE METABOLIC PANEL (CC13)
ALBUMIN: 2.7 g/dL — AB (ref 3.5–5.0)
ALK PHOS: 123 U/L (ref 40–150)
ALT: 24 U/L (ref 0–55)
ANION GAP: 10 meq/L (ref 3–11)
AST: 16 U/L (ref 5–34)
BILIRUBIN TOTAL: 0.2 mg/dL (ref 0.20–1.20)
BUN: 27.2 mg/dL — ABNORMAL HIGH (ref 7.0–26.0)
CO2: 24 mEq/L (ref 22–29)
Calcium: 8.8 mg/dL (ref 8.4–10.4)
Chloride: 104 mEq/L (ref 98–109)
Creatinine: 1.4 mg/dL — ABNORMAL HIGH (ref 0.7–1.3)
EGFR: 61 mL/min/{1.73_m2} — ABNORMAL LOW (ref >90)
GLUCOSE: 193 mg/dL — AB (ref 70–140)
POTASSIUM: 3.9 meq/L (ref 3.5–5.1)
SODIUM: 139 meq/L (ref 136–145)
TOTAL PROTEIN: 5.5 g/dL — AB (ref 6.4–8.3)

## 2015-02-20 LAB — CBC WITH DIFFERENTIAL/PLATELET
BASO%: 0.7 % (ref 0.0–2.0)
BASOS ABS: 0.2 10*3/uL — AB (ref 0.0–0.1)
EOS%: 0.3 % (ref 0.0–7.0)
Eosinophils Absolute: 0.1 10*3/uL (ref 0.0–0.5)
HCT: 26.7 % — ABNORMAL LOW (ref 38.4–49.9)
HEMOGLOBIN: 8.6 g/dL — AB (ref 13.0–17.1)
LYMPH%: 4.2 % — AB (ref 14.0–49.0)
MCH: 28.1 pg (ref 27.2–33.4)
MCHC: 32.4 g/dL (ref 32.0–36.0)
MCV: 86.9 fL (ref 79.3–98.0)
MONO#: 1.8 10*3/uL — AB (ref 0.1–0.9)
MONO%: 7.5 % (ref 0.0–14.0)
NEUT%: 87.3 % — ABNORMAL HIGH (ref 39.0–75.0)
NEUTROS ABS: 21.4 10*3/uL — AB (ref 1.5–6.5)
PLATELETS: 461 10*3/uL — AB (ref 140–400)
RBC: 3.07 10*6/uL — AB (ref 4.20–5.82)
RDW: 19.1 % — ABNORMAL HIGH (ref 11.0–14.6)
WBC: 24.5 10*3/uL — ABNORMAL HIGH (ref 4.0–10.3)
lymph#: 1 10*3/uL (ref 0.9–3.3)

## 2015-02-20 MED ORDER — SODIUM CHLORIDE 0.9 % IJ SOLN
10.0000 mL | INTRAMUSCULAR | Status: DC | PRN
  Administered 2015-02-20: 10 mL via INTRAVENOUS
  Filled 2015-02-20: qty 10

## 2015-02-20 MED ORDER — HEPARIN SOD (PORK) LOCK FLUSH 100 UNIT/ML IV SOLN
500.0000 [IU] | Freq: Once | INTRAVENOUS | Status: AC
  Administered 2015-02-20: 500 [IU] via INTRAVENOUS
  Filled 2015-02-20: qty 5

## 2015-02-20 NOTE — Telephone Encounter (Signed)
s.w. pt wife and advised on Aug appt.Marland KitchenMarland KitchenMarland KitchenMarland Kitchenpt ok and aware

## 2015-02-20 NOTE — Assessment & Plan Note (Signed)
This is reactive in nature related to recent steroid & GCSF treatment. He has no signs of infection. I will observe only.

## 2015-02-20 NOTE — Progress Notes (Signed)
[facility] OFFICE PROGRESS NOTE  Patient Care Team: Harrison Mons, PA-C as PCP - General (Physician Assistant) Corliss Parish, MD as Consulting Physician (Nephrology) Fanny Skates, MD as Consulting Physician (General Surgery) Heath Lark, MD as Consulting Physician (Hematology and Oncology)  SUMMARY OF ONCOLOGIC HISTORY: Oncology History   Lymphoma   Staging form: Lymphoid Neoplasms, AJCC 6th Edition     Clinical stage from 11/21/2014: Stage IV - Signed by Heath Lark, MD on 11/21/2014       Diffuse large B cell lymphoma   11/13/2014 Imaging PEt scan showed widespread disease including possible splenic involvement   11/19/2014 Surgery He has excisional LN biopsy and port placement   11/19/2014 Pathology Results Biopsy of lymph nodes showed DLBCL   11/29/2014 - 01/31/2015 Chemotherapy He received 4 cycles of RCHOP chemotherapy complicated by severe infusion reaction.   12/19/2014 - 01/09/2015 Chemotherapy He received 2 cycles of prophylactic IT chemotherapy.   01/31/2015 Adverse Reaction I reduce the dose of his chemotherapy, prednisone and dexamethasone due to poorly controlled diabetes and hyperglycemia   02/12/2015 Imaging  repeat PET CT scan show complete response to chemotherapy but new lung infiltrates    INTERVAL HISTORY: Please see below for problem oriented charting. He returns for further follow-up. He tolerated last treatment well. Denies new lymphadenopathy. His diabetes remained poorly controlled. He denies any cough or shortness of breath.  REVIEW OF SYSTEMS:   Constitutional: Denies fevers, chills or abnormal weight loss Eyes: Denies blurriness of vision Ears, nose, mouth, throat, and face: Denies mucositis or sore throat Respiratory: Denies cough, dyspnea or wheezes Cardiovascular: Denies palpitation, chest discomfort or lower extremity swelling Gastrointestinal:  Denies nausea, heartburn or change in bowel habits Skin: Denies abnormal skin  rashes Lymphatics: Denies new lymphadenopathy or easy bruising Neurological:Denies numbness, tingling or new weaknesses Behavioral/Psych: Mood is stable, no new changes  All other systems were reviewed with the patient and are negative.  I have reviewed the past medical history, past surgical history, social history and family history with the patient and they are unchanged from previous note.  ALLERGIES:  is allergic to ace inhibitors and rituximab.  MEDICATIONS:  Current Outpatient Prescriptions  Medication Sig Dispense Refill  . acetaminophen (TYLENOL) 500 MG tablet Take 500 mg by mouth daily. X 5 days after chemo    . amLODipine (NORVASC) 10 MG tablet TAKE ONE TABLET BY MOUTH ONCE DAILY 30 tablet 5  . aspirin 81 MG tablet Take 81 mg by mouth daily.    . fish oil-omega-3 fatty acids 1000 MG capsule Take 1 g by mouth daily.    Marland Kitchen glipiZIDE (GLUCOTROL XL) 10 MG 24 hr tablet TAKE ONE TABLET BY MOUTH ONCE DAILY 30 tablet 5  . glucose blood (ACCU-CHEK AVIVA PLUS) test strip Use as instructed 100 each 4  . hydrochlorothiazide (HYDRODIURIL) 25 MG tablet TAKE ONE TABLET BY MOUTH ONCE DAILY 30 tablet 5  . HYDROcodone-acetaminophen (NORCO) 5-325 MG per tablet Take 1-2 tablets by mouth every 6 (six) hours as needed. 30 tablet 0  . lidocaine-prilocaine (EMLA) cream Apply to affected area once 30 g 3  . loratadine (CLARITIN) 10 MG tablet Take 10 mg by mouth daily. 5 days after chemo    . losartan (COZAAR) 100 MG tablet Take 1 tablet (100 mg total) by mouth daily. 30 tablet 5  . ondansetron (ZOFRAN) 8 MG tablet Take 1 tablet (8 mg total) by mouth 2 (two) times daily. Start the day after chemo for 3 days. Then as  needed for nausea or vomiting. 30 tablet 1  . pravastatin (PRAVACHOL) 40 MG tablet TAKE ONE TABLET BY MOUTH ONCE DAILY 30 tablet 5  . predniSONE (DELTASONE) 20 MG tablet Take 3 tablets (60 mg total) by mouth daily with breakfast. Take 3 tabs for 4 days start the day after chemotherapy, every 21  days 12 tablet 6  . prochlorperazine (COMPAZINE) 10 MG tablet Take 1 tablet (10 mg total) by mouth every 6 (six) hours as needed (Nausea or vomiting). 30 tablet 6  . sitaGLIPtin (JANUVIA) 50 MG tablet Take 1 tablet (50 mg total) by mouth daily. 30 tablet 5   No current facility-administered medications for this visit.    PHYSICAL EXAMINATION: ECOG PERFORMANCE STATUS: 1 - Symptomatic but completely ambulatory  Filed Vitals:   02/20/15 1512  BP: 124/58  Pulse: 95  Temp: 97.5 F (36.4 C)  Resp: 18   Filed Weights   02/20/15 1512  Weight: 140 lb 8 oz (63.73 kg)    GENERAL:alert, no distress and comfortable SKIN: skin color, texture, turgor are normal, no rashes or significant lesions EYES: normal, Conjunctiva are pink and non-injected, sclera clear OROPHARYNX:no exudate, no erythema and lips, buccal mucosa, and tongue normal  NECK: supple, thyroid normal size, non-tender, without nodularity LYMPH:  no palpable lymphadenopathy in the cervical, axillary or inguinal LUNGS: clear to auscultation and percussion with normal breathing effort HEART: regular rate & rhythm and no murmurs and no lower extremity edema ABDOMEN:abdomen soft, non-tender and normal bowel sounds Musculoskeletal:no cyanosis of digits and no clubbing  NEURO: alert & oriented x 3 with fluent speech, no focal motor/sensory deficits  LABORATORY DATA:  I have reviewed the data as listed    Component Value Date/Time   NA 139 02/20/2015 1450   NA 127* 11/23/2014 0314   NA 139 07/17/2014   K 3.9 02/20/2015 1450   K 4.4 11/23/2014 0314   CL 96* 11/23/2014 0314   CO2 24 02/20/2015 1450   CO2 19* 11/22/2014 2357   GLUCOSE 193* 02/20/2015 1450   GLUCOSE 445* 11/23/2014 0314   BUN 27.2* 02/20/2015 1450   BUN 77* 11/23/2014 0314   BUN 26* 07/17/2014   CREATININE 1.4* 02/20/2015 1450   CREATININE 2.60* 11/23/2014 0314   CREATININE 2.30* 10/31/2014 1114   CREATININE 2.1* 07/17/2014   CALCIUM 8.8 02/20/2015 1450    CALCIUM 8.4* 11/22/2014 2357   PROT 5.5* 02/20/2015 1450   PROT 6.4* 11/22/2014 2357   ALBUMIN 2.7* 02/20/2015 1450   ALBUMIN 2.8* 11/22/2014 2357   AST 16 02/20/2015 1450   AST 30 11/22/2014 2357   ALT 24 02/20/2015 1450   ALT 45 11/22/2014 2357   ALKPHOS 123 02/20/2015 1450   ALKPHOS 85 11/22/2014 2357   BILITOT 0.20 02/20/2015 1450   BILITOT 0.8 11/22/2014 2357   GFRNONAA 26* 11/22/2014 2357   GFRNONAA 33* 09/11/2013 0920   GFRAA 30* 11/22/2014 2357   GFRAA 38* 09/11/2013 0920    No results found for: SPEP, UPEP  Lab Results  Component Value Date   WBC 24.5* 02/20/2015   NEUTROABS 21.4* 02/20/2015   HGB 8.6* 02/20/2015   HCT 26.7* 02/20/2015   MCV 86.9 02/20/2015   PLT 461* 02/20/2015      Chemistry      Component Value Date/Time   NA 139 02/20/2015 1450   NA 127* 11/23/2014 0314   NA 139 07/17/2014   K 3.9 02/20/2015 1450   K 4.4 11/23/2014 0314   CL 96* 11/23/2014 0314  CO2 24 02/20/2015 1450   CO2 19* 11/22/2014 2357   BUN 27.2* 02/20/2015 1450   BUN 77* 11/23/2014 0314   BUN 26* 07/17/2014   CREATININE 1.4* 02/20/2015 1450   CREATININE 2.60* 11/23/2014 0314   CREATININE 2.30* 10/31/2014 1114   CREATININE 2.1* 07/17/2014   GLU 382* 01/31/2015 1551      Component Value Date/Time   CALCIUM 8.8 02/20/2015 1450   CALCIUM 8.4* 11/22/2014 2357   ALKPHOS 123 02/20/2015 1450   ALKPHOS 85 11/22/2014 2357   AST 16 02/20/2015 1450   AST 30 11/22/2014 2357   ALT 24 02/20/2015 1450   ALT 45 11/22/2014 2357   BILITOT 0.20 02/20/2015 1450   BILITOT 0.8 11/22/2014 2357       RADIOGRAPHIC STUDIES: I reviewed the PET CT scan with him and his wife I have personally reviewed the radiological images as listed and agreed with the findings in the report.   ASSESSMENT & PLAN:  Diffuse large B cell lymphoma  I reviewed his case at the hematology tumor board 2 days ago. I reviewed her imaging study with the patient and his wife. The patient had achieved complete  response to treatment after 4 cycles of chemotherapy. Repeat bilateral pulmonary infiltrates, I am concerned about potential drug induced atypical reaction.  of all the drugs listed, I think G-CSF might be the most likely culprit although it is a complication. I recommend stopping chemotherapy and refer him to pulmonologist for evaluation. I plan to see him back within a month to recheck his blood work and assess him clinically.  I would like to maintain port patency with port flushes every 6-8 weeks for now. The patient presented with high burden of disease and I'm concerned about high risk of recurrence in the future.  Anemia in neoplastic disease This is likely due to recent treatment. The patient denies recent history of bleeding such as epistaxis, hematuria or hematochezia. He is asymptomatic from the anemia. I will observe for now.  He does not require transfusion now.  Chronic kidney disease, stage III (moderate) He has chronic kidney disease and this is unrelated to the lymphoma.  Continue close monitoring. he will continue current medical management. I recommend close follow-up with primary care doctor for medication adjustment.      DM (diabetes mellitus), type 2 with renal complications His blood sugar was poorly controlled due to recent treatment  He has completed all his treatment.  I recommend he follows with primary care doctor for medication adjustment  Leukocytosis This is reactive in nature related to recent steroid & GCSF treatment. He has no signs of infection. I will observe only.     Pulmonary infiltrate present on computed tomography  I'm concerned about drug induced atypical reaction causing pulmonary infiltrates. I recommend consultation with pulmonologist. Thankfully, the patient is not symptomatic.   Orders Placed This Encounter  Procedures  . Ambulatory referral to Pulmonology    Referral Priority:  Routine    Referral Type:  Consultation     Referral Reason:  Specialty Services Required    Referred to Provider:  Javier Glazier, MD    Requested Specialty:  Pulmonary Disease    Number of Visits Requested:  1  . Hold Tube, Blood Bank    Standing Status: Future     Number of Occurrences:      Standing Expiration Date: 03/26/2016   All questions were answered. The patient knows to call the clinic with any problems, questions or concerns.  No barriers to learning was detected. I spent 25 minutes counseling the patient face to face. The total time spent in the appointment was 40 minutes and more than 50% was on counseling and review of test results     [facility], Constantin Hillery, MD 02/20/2015 4:01 PM

## 2015-02-20 NOTE — Telephone Encounter (Signed)
Appointments made and avs printed for patient,[facility] will call the patient

## 2015-02-20 NOTE — Assessment & Plan Note (Signed)
This is likely due to recent treatment. The patient denies recent history of bleeding such as epistaxis, hematuria or hematochezia. He is asymptomatic from the anemia. I will observe for now.  He does not require transfusion now. 

## 2015-02-20 NOTE — Assessment & Plan Note (Signed)
I reviewed his case at the hematology tumor board 2 days ago. I reviewed her imaging study with the patient and his wife. The patient had achieved complete response to treatment after 4 cycles of chemotherapy. Repeat bilateral pulmonary infiltrates, I am concerned about potential drug induced atypical reaction.  of all the drugs listed, I think G-CSF might be the most likely culprit although it is a complication. I recommend stopping chemotherapy and refer him to pulmonologist for evaluation. I plan to see him back within a month to recheck his blood work and assess him clinically.  I would like to maintain port patency with port flushes every 6-8 weeks for now. The patient presented with high burden of disease and I'm concerned about high risk of recurrence in the future.

## 2015-02-20 NOTE — Assessment & Plan Note (Signed)
His blood sugar was poorly controlled due to recent treatment  He has completed all his treatment.  I recommend he follows with primary care doctor for medication adjustment

## 2015-02-20 NOTE — Assessment & Plan Note (Signed)
I'm concerned about drug induced atypical reaction causing pulmonary infiltrates. I recommend consultation with pulmonologist. Thankfully, the patient is not symptomatic.

## 2015-02-20 NOTE — Assessment & Plan Note (Signed)
He has chronic kidney disease and this is unrelated to the lymphoma.  Continue close monitoring. he will continue current medical management. I recommend close follow-up with primary care doctor for medication adjustment. 

## 2015-02-21 ENCOUNTER — Ambulatory Visit: Payer: Medicare Other | Admitting: Hematology and Oncology

## 2015-02-21 ENCOUNTER — Encounter: Payer: Medicare Other | Admitting: Nutrition

## 2015-02-21 ENCOUNTER — Ambulatory Visit: Payer: Medicare Other

## 2015-02-21 ENCOUNTER — Other Ambulatory Visit: Payer: Medicare Other

## 2015-02-24 ENCOUNTER — Ambulatory Visit: Payer: Medicare Other

## 2015-03-14 ENCOUNTER — Encounter: Payer: Self-pay | Admitting: Pulmonary Disease

## 2015-03-14 ENCOUNTER — Ambulatory Visit: Payer: Medicare Other

## 2015-03-14 ENCOUNTER — Ambulatory Visit (INDEPENDENT_AMBULATORY_CARE_PROVIDER_SITE_OTHER): Payer: Medicare Other | Admitting: Pulmonary Disease

## 2015-03-14 VITALS — BP 124/70 | HR 93 | Ht 70.0 in | Wt 148.6 lb

## 2015-03-14 DIAGNOSIS — R918 Other nonspecific abnormal finding of lung field: Secondary | ICD-10-CM

## 2015-03-14 DIAGNOSIS — R609 Edema, unspecified: Secondary | ICD-10-CM

## 2015-03-14 DIAGNOSIS — C8330 Diffuse large B-cell lymphoma, unspecified site: Secondary | ICD-10-CM | POA: Diagnosis not present

## 2015-03-14 DIAGNOSIS — J438 Other emphysema: Secondary | ICD-10-CM

## 2015-03-14 NOTE — Patient Instructions (Addendum)
1. We are going to repeat a CT scan of your chest to see if everything has resolved. 2. We are going to do a walking test & breathing test to see how your lung are working. 3. I am checking you for the genetic form of emphysema. 4. I will see you back in 4 weeks but please call if you have any questions.

## 2015-03-14 NOTE — Progress Notes (Signed)
Subjective:    [name], male    DOB: 06-28-1951, 64 y.o.   MRN: [id number]  HPI Patient has known DLBCL. He was treated with RCHOP but had an infusion reaction. He was treated with Prednisone & Decadron in July with reduced chemo. He underwent repeat PET CT imaging in August which showed good response to chemotherapy but new basilar infiltrates on his CT imaging. He reports his breathing has significantly improved since he finished chemotherapy. He denies any coughing or wheezing. No chest tightness or pressure. He reports chronic lower extremity edema. No paroxysmal nocturnal dyspnea. Denies any orthopnea. No fever, chills, or sweats. No rashes or abnormal bruising.   Review of Systems No nausea, emesis, or diarrhea. No dysuria or hematuria. A pertinent 14 point review of systems is negative except as per the history of presenting illness.  Allergies  Allergen Reactions  . Ace Inhibitors Hives  . Rituximab Other (See Comments)    This medication caused him to sweat & shake   Current Outpatient Prescriptions on File Prior to Visit  Medication Sig Dispense Refill  . amLODipine (NORVASC) 10 MG tablet TAKE ONE TABLET BY MOUTH ONCE DAILY 30 tablet 5  . aspirin 81 MG tablet Take 81 mg by mouth daily.    . fish oil-omega-3 fatty acids 1000 MG capsule Take 1 g by mouth daily.    Marland Kitchen glipiZIDE (GLUCOTROL XL) 10 MG 24 hr tablet TAKE ONE TABLET BY MOUTH ONCE DAILY 30 tablet 5  . glucose blood (ACCU-CHEK AVIVA PLUS) test strip Use as instructed 100 each 4  . hydrochlorothiazide (HYDRODIURIL) 25 MG tablet TAKE ONE TABLET BY MOUTH ONCE DAILY 30 tablet 5  . lidocaine-prilocaine (EMLA) cream Apply to affected area once 30 g 3  . losartan (COZAAR) 100 MG tablet Take 1 tablet (100 mg total) by mouth daily. 30 tablet 5  . pravastatin (PRAVACHOL) 40 MG tablet TAKE ONE TABLET BY MOUTH ONCE DAILY 30 tablet 5  . sitaGLIPtin (JANUVIA) 50 MG tablet Take 1 tablet (50 mg total) by mouth daily. 30  tablet 5   No current facility-administered medications on file prior to visit.   Past Medical History  Diagnosis Date  . Essential hypertension, benign   . Type II or unspecified type diabetes mellitus with renal manifestations, not stated as uncontrolled   . Chronic kidney disease, stage III (moderate)   . History of tobacco abuse     quit 12/2008  . History of ETOH abuse     quit 1999  . Erectile dysfunction   . Shortness of breath dyspnea     exertion  . Pneumonia   . Anemia   . Cramps, extremity     hx of in legs bilat; currently having issues in toes bilat   . Hyperparathyroidism     secondary / renal per H&P with [facility] 07/17/2014   . Cancer 2016    lymphoma  . Pulmonary infiltrate present on computed tomography 02/20/2015  . Diabetes    Past Surgical History  Procedure Laterality Date  . Arm wound repair / closure      chainsaw; left forearm/wrist  . Colonscopy     . Axillary lymph node biopsy Right 11/19/2014    Procedure: EXCISIONAL BIOPSY DEEP RIGHT AXILLARY LYMPH NODE ;  Surgeon: Fanny Skates, MD;  Location: [facility];  Service: General;  Laterality: Right;  . Portacath placement N/A 11/19/2014    Procedure: INSERTION PORT-A-CATH;  Surgeon: Fanny Skates, MD;  Location:  [facility];  Service: General;  Laterality: N/A;   Family History  Problem Relation Age of Onset  . Cancer Father     colon  . Cancer Sister     Breast  . Stroke Brother   . Stroke Brother   . Lung disease Neg Hx   . Rheumatologic disease Neg Hx    Social History   Social History  . Marital Status: Married    Spouse Name: [name]  . Number of Children: 2  . Years of Education: 11   Occupational History  . retired     Location manager   Social History Main Topics  . Smoking status: Former Smoker -- 1.00 packs/day for 50 years    Types: Cigarettes    Quit date: 07/05/2005  . Smokeless tobacco: Never Used  . Alcohol Use: No     Comment: hx of ETOH use; quit drinking  approx 2008   . Drug Use: No  . Sexual Activity:    Partners: Female   Other Topics Concern  . None   Social History Narrative   Lives with wife. She was diagnosed with stage III breast cancer 03/2014. She completed chemotherapy and had a mastectomy, and starts radiation therapy 09/2014.      [place] [facility]:   He is originally from [facility]. He has always lived in [place]. 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 No known TB exposure. No known asbestos exposure. He has traveled to [place], [place], [place], & [place]. 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 No pets currently. Previously had cats and a dog. No bird exposure. No mold exposure. No hot tub exposure.      Objective:   Physical Exam Blood pressure 124/70, pulse 93, height '5\' 10"'$  (1.778 m), weight 148 lb 9.6 oz (67.405 kg), SpO2 100 %. General:  Awake. Alert. No acute distress.  Integument:  Warm & dry. No rash on exposed skin. No bruising. Healed surgical scar right wrist. Lymphatics:  No appreciated cervical or supraclavicular lymphadenoapthy. HEENT:  Moist mucus membranes. No oral ulcers. No scleral injection or icterus. PERRL. Cardiovascular:  Regular rate. Trace bilateral lower extremity edema. No appreciable JVD.  Pulmonary:  Good aeration & clear to auscultation bilaterally. Symmetric chest wall expansion. No accessory muscle use on room air. Abdomen: Soft. Normal bowel sounds. Nondistended. Grossly nontender. Musculoskeletal:  Normal bulk and tone. Hand grip strength 5/5 bilaterally. No joint deformity or effusion appreciated. Neurological:  CN 2-12 grossly in tact. No meningismus. Moving all 4 extremities equally. Symmetric patellar deep tendon reflexes. Psychiatric:  Mood and affect congruent. Speech normal rhythm, rate & tone.   IMAGING PET CT 02/12/15 (personally reviewed by me): No hypermetabolic mediastinal or hilar lymph nodes. No suspicious pulmonary  nodules on CT imaging. Small pericardial effusion. Mild diffuse bronchial wall thickening with centrilobular and paraseptal emphysema that is apical predominant with some subpleural bleb formation. Interval increase in diffuse groundglass throughout the lungs bilaterally with some associated septal thickening predominantly in the bases. No pleural effusion or thickening appreciated. Spleen normal in size with persistent hypermetabolism. No hypermetabolic lymph nodes within the abdomen or pelvis. Diffuse hypermetabolism throughout the entire skeleton possibly related to G-CSF therapy.   CARDIAC TTE (11/27/14):6 LV normal in size. EF 55-60%. Normal LV wall motion. Grade 1 diastolic dysfunction. LA & RA normal in size. RV normal in size and function. Pulmonary artery systolic pressure  20 mmHg. Trivial aortic regurgitation without stenosis. Mild mitral regurgitation without stenosis. No pulmonic regurgitation or stenosis. Mild tricuspid regurgitation. Mild circumferential pericardial effusion without tamponade physiology.  PATHOLOGY FLOW CYTOMETRY (11/19/14): Atypical lymphocyte population with limited tissue for evaluation. 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 Since completing chemotherapy the patient reports his dyspnea has been steadily improving. He denies any other infectious symptoms. I am suspicious that he may have had an acute/subacute pneumonitis from the rituximab. I reviewed his prior echocardiogram which showed no evidence of systolic dysfunction but did have grade 1 diastolic dysfunction. I feel that evaluating for possible worsening of his congestive heart failure is reasonable at this time. Complicating the patient's picture is his underlying emphysema which is  likely due to his prior tobacco use. Still, he needs to be assessed for possible underlying COPD and alpha-1 antitrypsin deficiency. I instructed the patient contact my office if he had any new breathing problems before his next appointment.  1. Bilateral lung infiltrates on CT scan: Checking high-resolution CT scan of the chest without contrast. 2. Diffuse large B-cell lymphoma: Follows with Dr. Alvy Bimler. Status post chemotherapy with good response on PET/CT imaging. 3. Edema: Checking serum BNP. If this is elevated I would consider repeating transthoracic echocardiogram. 4. Emphysema: Checking for alpha-1 antitrypsin deficiency. Checking full pulmonary function testing as well as 6 minute walk test. 5. Follow-up: Patient to return to clinic in 4 weeks or sooner if needed.

## 2015-03-17 ENCOUNTER — Ambulatory Visit: Payer: Medicare Other

## 2015-03-18 ENCOUNTER — Encounter: Payer: Self-pay | Admitting: Hematology and Oncology

## 2015-03-18 ENCOUNTER — Other Ambulatory Visit (HOSPITAL_BASED_OUTPATIENT_CLINIC_OR_DEPARTMENT_OTHER): Payer: Medicare Other

## 2015-03-18 ENCOUNTER — Telehealth: Payer: Self-pay | Admitting: Hematology and Oncology

## 2015-03-18 ENCOUNTER — Ambulatory Visit (HOSPITAL_BASED_OUTPATIENT_CLINIC_OR_DEPARTMENT_OTHER): Payer: Medicare Other

## 2015-03-18 ENCOUNTER — Ambulatory Visit (HOSPITAL_BASED_OUTPATIENT_CLINIC_OR_DEPARTMENT_OTHER): Payer: Medicare Other | Admitting: Hematology and Oncology

## 2015-03-18 VITALS — BP 120/60 | HR 84 | Temp 97.8°F | Resp 18 | Ht 70.0 in | Wt 148.1 lb

## 2015-03-18 DIAGNOSIS — Z95828 Presence of other vascular implants and grafts: Secondary | ICD-10-CM

## 2015-03-18 DIAGNOSIS — R609 Edema, unspecified: Secondary | ICD-10-CM

## 2015-03-18 DIAGNOSIS — C8330 Diffuse large B-cell lymphoma, unspecified site: Secondary | ICD-10-CM | POA: Diagnosis not present

## 2015-03-18 DIAGNOSIS — Z452 Encounter for adjustment and management of vascular access device: Secondary | ICD-10-CM

## 2015-03-18 DIAGNOSIS — D630 Anemia in neoplastic disease: Secondary | ICD-10-CM

## 2015-03-18 DIAGNOSIS — R918 Other nonspecific abnormal finding of lung field: Secondary | ICD-10-CM | POA: Diagnosis not present

## 2015-03-18 DIAGNOSIS — C8590 Non-Hodgkin lymphoma, unspecified, unspecified site: Secondary | ICD-10-CM

## 2015-03-18 LAB — CBC WITH DIFFERENTIAL/PLATELET
BASO%: 0.7 % (ref 0.0–2.0)
BASOS ABS: 0.1 10*3/uL (ref 0.0–0.1)
EOS ABS: 0.1 10*3/uL (ref 0.0–0.5)
EOS%: 1.5 % (ref 0.0–7.0)
HCT: 31.8 % — ABNORMAL LOW (ref 38.4–49.9)
HGB: 10.5 g/dL — ABNORMAL LOW (ref 13.0–17.1)
LYMPH%: 8.3 % — AB (ref 14.0–49.0)
MCH: 29.5 pg (ref 27.2–33.4)
MCHC: 33 g/dL (ref 32.0–36.0)
MCV: 89.3 fL (ref 79.3–98.0)
MONO#: 0.7 10*3/uL (ref 0.1–0.9)
MONO%: 8.1 % (ref 0.0–14.0)
NEUT%: 81.4 % — ABNORMAL HIGH (ref 39.0–75.0)
NEUTROS ABS: 6.7 10*3/uL — AB (ref 1.5–6.5)
Platelets: 189 10*3/uL (ref 140–400)
RBC: 3.56 10*6/uL — AB (ref 4.20–5.82)
RDW: 17.3 % — ABNORMAL HIGH (ref 11.0–14.6)
WBC: 8.2 10*3/uL (ref 4.0–10.3)
lymph#: 0.7 10*3/uL — ABNORMAL LOW (ref 0.9–3.3)

## 2015-03-18 LAB — COMPREHENSIVE METABOLIC PANEL (CC13)
ALT: 19 U/L (ref 0–55)
ANION GAP: 7 meq/L (ref 3–11)
AST: 19 U/L (ref 5–34)
Albumin: 3.3 g/dL — ABNORMAL LOW (ref 3.5–5.0)
Alkaline Phosphatase: 73 U/L (ref 40–150)
BUN: 25.1 mg/dL (ref 7.0–26.0)
CHLORIDE: 108 meq/L (ref 98–109)
CO2: 26 meq/L (ref 22–29)
Calcium: 9 mg/dL (ref 8.4–10.4)
Creatinine: 1.7 mg/dL — ABNORMAL HIGH (ref 0.7–1.3)
EGFR: 48 mL/min/{1.73_m2} — AB (ref >90)
Glucose: 176 mg/dl — ABNORMAL HIGH (ref 70–140)
Potassium: 4.2 mEq/L (ref 3.5–5.1)
Sodium: 140 mEq/L (ref 136–145)
Total Bilirubin: 0.43 mg/dL (ref 0.20–1.20)
Total Protein: 5.7 g/dL — ABNORMAL LOW (ref 6.4–8.3)

## 2015-03-18 LAB — HOLD TUBE, BLOOD BANK

## 2015-03-18 MED ORDER — HEPARIN SOD (PORK) LOCK FLUSH 100 UNIT/ML IV SOLN
500.0000 [IU] | Freq: Once | INTRAVENOUS | Status: AC
  Administered 2015-03-18: 500 [IU] via INTRAVENOUS
  Filled 2015-03-18: qty 5

## 2015-03-18 MED ORDER — SODIUM CHLORIDE 0.9 % IJ SOLN
10.0000 mL | INTRAMUSCULAR | Status: DC | PRN
  Administered 2015-03-18: 10 mL via INTRAVENOUS
  Filled 2015-03-18: qty 10

## 2015-03-18 NOTE — Assessment & Plan Note (Signed)
This is likely due to recent treatment. The patient denies recent history of bleeding such as epistaxis, hematuria or hematochezia. He is asymptomatic from the anemia. I will observe for now.  He does not require transfusion now. 

## 2015-03-18 NOTE — Assessment & Plan Note (Signed)
He was seen by pulmonologist recently. High resolution CT scan is pending. I will consult with his pulmonologist after result is available for further treatment recommendation. Clinically, he is not symptomatic.

## 2015-03-18 NOTE — Assessment & Plan Note (Signed)
-  He is doing very well and is recovering well from recent treatment side effects. He would need to be seen with repeat history, physical examination and blood work in about 3 months. I am awaiting final recommendation from pulmonologist regarding the pulmonary infiltrates. I will schedule his appointment in the future pending further recommendation.

## 2015-03-18 NOTE — Telephone Encounter (Signed)
Gave and printed appt sched and avs for pt for Sept thru July 2017

## 2015-03-18 NOTE — Progress Notes (Signed)
[facility] OFFICE PROGRESS NOTE  Patient Care Team: Harrison Mons, PA-C as PCP - General (Physician Assistant) Corliss Parish, MD as Consulting Physician (Nephrology) Fanny Skates, MD as Consulting Physician (General Surgery) Heath Lark, MD as Consulting Physician (Hematology and Oncology)  SUMMARY OF ONCOLOGIC HISTORY: Oncology History   Lymphoma   Staging form: Lymphoid Neoplasms, AJCC 6th Edition     Clinical stage from 11/21/2014: Stage IV - Signed by Heath Lark, MD on 11/21/2014       Diffuse large B cell lymphoma   11/13/2014 Imaging PEt scan showed widespread disease including possible splenic involvement   11/19/2014 Surgery He has excisional LN biopsy and port placement   11/19/2014 Pathology Results Biopsy of lymph nodes showed DLBCL   11/29/2014 - 01/31/2015 Chemotherapy He received 4 cycles of RCHOP chemotherapy complicated by severe infusion reaction.   12/19/2014 - 01/09/2015 Chemotherapy He received 2 cycles of prophylactic IT chemotherapy.   01/31/2015 Adverse Reaction I reduce the dose of his chemotherapy, prednisone and dexamethasone due to poorly controlled diabetes and hyperglycemia   02/12/2015 Imaging  repeat PET CT scan show complete response to chemotherapy but new lung infiltrates    INTERVAL HISTORY: Please see below for problem oriented charting. He returns for further follow-up. His doing well. Denies recent infection. His energy level is excellent. Denies new lymphadenopathy. No recent shortness breath or cough.  REVIEW OF SYSTEMS:   Constitutional: Denies fevers, chills or abnormal weight loss Eyes: Denies blurriness of vision Ears, nose, mouth, throat, and face: Denies mucositis or sore throat Respiratory: Denies cough, dyspnea or wheezes Cardiovascular: Denies palpitation, chest discomfort or lower extremity swelling Gastrointestinal:  Denies nausea, heartburn or change in bowel habits Skin: Denies abnormal skin  rashes Lymphatics: Denies new lymphadenopathy or easy bruising Neurological:Denies numbness, tingling or new weaknesses Behavioral/Psych: Mood is stable, no new changes  All other systems were reviewed with the patient and are negative.  I have reviewed the past medical history, past surgical history, social history and family history with the patient and they are unchanged from previous note.  ALLERGIES:  is allergic to ace inhibitors and rituximab.  MEDICATIONS:  Current Outpatient Prescriptions  Medication Sig Dispense Refill  . amLODipine (NORVASC) 10 MG tablet TAKE ONE TABLET BY MOUTH ONCE DAILY 30 tablet 5  . aspirin 81 MG tablet Take 81 mg by mouth daily.    . fish oil-omega-3 fatty acids 1000 MG capsule Take 1 g by mouth daily.    Marland Kitchen glipiZIDE (GLUCOTROL XL) 10 MG 24 hr tablet TAKE ONE TABLET BY MOUTH ONCE DAILY 30 tablet 5  . glucose blood (ACCU-CHEK AVIVA PLUS) test strip Use as instructed 100 each 4  . hydrochlorothiazide (HYDRODIURIL) 25 MG tablet TAKE ONE TABLET BY MOUTH ONCE DAILY 30 tablet 5  . lidocaine-prilocaine (EMLA) cream Apply to affected area once 30 g 3  . losartan (COZAAR) 100 MG tablet Take 1 tablet (100 mg total) by mouth daily. 30 tablet 5  . pravastatin (PRAVACHOL) 40 MG tablet TAKE ONE TABLET BY MOUTH ONCE DAILY 30 tablet 5  . sitaGLIPtin (JANUVIA) 50 MG tablet Take 1 tablet (50 mg total) by mouth daily. 30 tablet 5   No current facility-administered medications for this visit.    PHYSICAL EXAMINATION: ECOG PERFORMANCE STATUS: 0 - Asymptomatic  Filed Vitals:   03/18/15 0907  BP: 120/60  Pulse: 84  Temp: 97.8 F (36.6 C)  Resp: 18   Filed Weights   03/18/15 0907  Weight: 148 lb 1.6  oz (67.178 kg)    GENERAL:alert, no distress and comfortable SKIN: skin color, texture, turgor are normal, no rashes or significant lesions EYES: normal, Conjunctiva are pink and non-injected, sclera clear OROPHARYNX:no exudate, no erythema and lips, buccal mucosa,  and tongue normal  NECK: supple, thyroid normal size, non-tender, without nodularity LYMPH:  no palpable lymphadenopathy in the cervical, axillary or inguinal LUNGS: clear to auscultation and percussion with normal breathing effort HEART: regular rate & rhythm and no murmurs and no lower extremity edema ABDOMEN:abdomen soft, non-tender and normal bowel sounds Musculoskeletal:no cyanosis of digits and no clubbing  NEURO: alert & oriented x 3 with fluent speech, no focal motor/sensory deficits  LABORATORY DATA:  I have reviewed the data as listed    Component Value Date/Time   NA 139 02/20/2015 1450   NA 127* 11/23/2014 0314   NA 139 07/17/2014   K 3.9 02/20/2015 1450   K 4.4 11/23/2014 0314   CL 96* 11/23/2014 0314   CO2 24 02/20/2015 1450   CO2 19* 11/22/2014 2357   GLUCOSE 193* 02/20/2015 1450   GLUCOSE 445* 11/23/2014 0314   BUN 27.2* 02/20/2015 1450   BUN 77* 11/23/2014 0314   BUN 26* 07/17/2014   CREATININE 1.4* 02/20/2015 1450   CREATININE 2.60* 11/23/2014 0314   CREATININE 2.30* 10/31/2014 1114   CREATININE 2.1* 07/17/2014   CALCIUM 8.8 02/20/2015 1450   CALCIUM 8.4* 11/22/2014 2357   PROT 5.5* 02/20/2015 1450   PROT 6.4* 11/22/2014 2357   ALBUMIN 2.7* 02/20/2015 1450   ALBUMIN 2.8* 11/22/2014 2357   AST 16 02/20/2015 1450   AST 30 11/22/2014 2357   ALT 24 02/20/2015 1450   ALT 45 11/22/2014 2357   ALKPHOS 123 02/20/2015 1450   ALKPHOS 85 11/22/2014 2357   BILITOT 0.20 02/20/2015 1450   BILITOT 0.8 11/22/2014 2357   GFRNONAA 26* 11/22/2014 2357   GFRNONAA 33* 09/11/2013 0920   GFRAA 30* 11/22/2014 2357   GFRAA 38* 09/11/2013 0920    No results found for: SPEP, UPEP  Lab Results  Component Value Date   WBC 8.2 03/18/2015   NEUTROABS 6.7* 03/18/2015   HGB 10.5* 03/18/2015   HCT 31.8* 03/18/2015   MCV 89.3 03/18/2015   PLT 189 03/18/2015      Chemistry      Component Value Date/Time   NA 139 02/20/2015 1450   NA 127* 11/23/2014 0314   NA 139  07/17/2014   K 3.9 02/20/2015 1450   K 4.4 11/23/2014 0314   CL 96* 11/23/2014 0314   CO2 24 02/20/2015 1450   CO2 19* 11/22/2014 2357   BUN 27.2* 02/20/2015 1450   BUN 77* 11/23/2014 0314   BUN 26* 07/17/2014   CREATININE 1.4* 02/20/2015 1450   CREATININE 2.60* 11/23/2014 0314   CREATININE 2.30* 10/31/2014 1114   CREATININE 2.1* 07/17/2014   GLU 382* 01/31/2015 1551      Component Value Date/Time   CALCIUM 8.8 02/20/2015 1450   CALCIUM 8.4* 11/22/2014 2357   ALKPHOS 123 02/20/2015 1450   ALKPHOS 85 11/22/2014 2357   AST 16 02/20/2015 1450   AST 30 11/22/2014 2357   ALT 24 02/20/2015 1450   ALT 45 11/22/2014 2357   BILITOT 0.20 02/20/2015 1450   BILITOT 0.8 11/22/2014 2357     ASSESSMENT & PLAN:  Diffuse large B cell lymphoma -He is doing very well and is recovering well from recent treatment side effects. He would need to be seen with repeat history, physical examination and  blood work in about 3 months. I am awaiting final recommendation from pulmonologist regarding the pulmonary infiltrates. I will schedule his appointment in the future pending further recommendation.  Anemia in neoplastic disease This is likely due to recent treatment. The patient denies recent history of bleeding such as epistaxis, hematuria or hematochezia. He is asymptomatic from the anemia. I will observe for now.  He does not require transfusion now.    Pulmonary infiltrate present on computed tomography He was seen by pulmonologist recently. High resolution CT scan is pending. I will consult with his pulmonologist after result is available for further treatment recommendation. Clinically, he is not symptomatic.   No orders of the defined types were placed in this encounter.   All questions were answered. The patient knows to call the clinic with any problems, questions or concerns. No barriers to learning was detected. I spent 15 minutes counseling the patient face to face. The total time  spent in the appointment was 20 minutes and more than 50% was on counseling and review of test results     [facility], [place], MD 03/18/2015 9:20 AM

## 2015-03-18 NOTE — Patient Instructions (Signed)

## 2015-03-21 ENCOUNTER — Ambulatory Visit (HOSPITAL_COMMUNITY)
Admission: RE | Admit: 2015-03-21 | Discharge: 2015-03-21 | Disposition: A | Discharge status: Home / Self Care | Payer: Medicare Other | Source: Ambulatory Visit | Attending: Pulmonary Disease | Admitting: Pulmonary Disease

## 2015-03-21 ENCOUNTER — Encounter (HOSPITAL_COMMUNITY): Payer: Self-pay

## 2015-03-21 DIAGNOSIS — R911 Solitary pulmonary nodule: Secondary | ICD-10-CM | POA: Insufficient documentation

## 2015-03-21 DIAGNOSIS — Z8572 Personal history of non-Hodgkin lymphomas: Secondary | ICD-10-CM | POA: Insufficient documentation

## 2015-03-21 DIAGNOSIS — I7090 Unspecified atherosclerosis: Secondary | ICD-10-CM | POA: Diagnosis not present

## 2015-03-21 DIAGNOSIS — J432 Centrilobular emphysema: Secondary | ICD-10-CM | POA: Insufficient documentation

## 2015-03-21 DIAGNOSIS — R918 Other nonspecific abnormal finding of lung field: Secondary | ICD-10-CM

## 2015-03-24 ENCOUNTER — Other Ambulatory Visit: Payer: Self-pay | Admitting: Hematology and Oncology

## 2015-03-24 ENCOUNTER — Telehealth: Payer: Self-pay | Admitting: Hematology and Oncology

## 2015-03-24 NOTE — Telephone Encounter (Signed)
s.w pt wife and advised on OCT appt....pt ok and aware

## 2015-03-24 NOTE — Telephone Encounter (Signed)
Reviewed CT report. No ILD seen Follow-up next month

## 2015-03-27 ENCOUNTER — Other Ambulatory Visit: Payer: Self-pay | Admitting: Physician Assistant

## 2015-03-27 ENCOUNTER — Ambulatory Visit (INDEPENDENT_AMBULATORY_CARE_PROVIDER_SITE_OTHER): Payer: Medicare Other

## 2015-03-27 DIAGNOSIS — I129 Hypertensive chronic kidney disease with stage 1 through stage 4 chronic kidney disease, or unspecified chronic kidney disease: Secondary | ICD-10-CM

## 2015-03-27 DIAGNOSIS — Z23 Encounter for immunization: Secondary | ICD-10-CM | POA: Diagnosis not present

## 2015-03-27 DIAGNOSIS — E1122 Type 2 diabetes mellitus with diabetic chronic kidney disease: Secondary | ICD-10-CM

## 2015-03-27 MED ORDER — SITAGLIPTIN PHOSPHATE 50 MG PO TABS: 50.0000 mg | ORAL_TABLET | Freq: Every day | ORAL | Status: DC

## 2015-03-27 MED ORDER — GLIPIZIDE ER 10 MG PO TB24: ORAL_TABLET | ORAL | Status: DC

## 2015-03-27 MED ORDER — AMLODIPINE BESYLATE 10 MG PO TABS: ORAL_TABLET | ORAL | Status: DC

## 2015-03-27 NOTE — Telephone Encounter (Signed)
Chelle did you want him to recheck first? Your note states he is stable but I want to make sure he doesn't need follow up.

## 2015-03-27 NOTE — Telephone Encounter (Signed)
I refilled his medications, but, yes, I'd like to see him.  I saw him 3 months ago, so I was expecting to see him this month. However, he's having a lot of oncology appointments and so as long as his glucose is usually below 200, I think it's ok to skip/delay the visit with me.  If he does that, I'd like to see him in December/January.  Meds ordered this encounter  Medications  . amLODipine (NORVASC) 10 MG tablet    Sig: TAKE ONE TABLET BY MOUTH ONCE DAILY    Dispense:  90 tablet    Refill:  3  . sitaGLIPtin (JANUVIA) 50 MG tablet    Sig: Take 1 tablet (50 mg total) by mouth daily.    Dispense:  90 tablet    Refill:  3  . glipiZIDE (GLUCOTROL XL) 10 MG 24 hr tablet    Sig: TAKE ONE TABLET BY MOUTH ONCE DAILY    Dispense:  90 tablet    Refill:  3

## 2015-03-27 NOTE — Telephone Encounter (Signed)
Patient request a refill of Januvia 50 MG, Amlodipine 10 MG, glipizide 10 MG. [facility] on [address] [phone]

## 2015-04-03 ENCOUNTER — Ambulatory Visit (INDEPENDENT_AMBULATORY_CARE_PROVIDER_SITE_OTHER): Payer: Medicare Other | Admitting: Physician Assistant

## 2015-04-03 ENCOUNTER — Encounter: Payer: Self-pay | Admitting: Physician Assistant

## 2015-04-03 VITALS — BP 120/58 | HR 90 | Temp 97.8°F | Resp 16 | Ht 70.0 in | Wt 153.4 lb

## 2015-04-03 DIAGNOSIS — N189 Chronic kidney disease, unspecified: Secondary | ICD-10-CM

## 2015-04-03 DIAGNOSIS — E785 Hyperlipidemia, unspecified: Secondary | ICD-10-CM | POA: Diagnosis not present

## 2015-04-03 DIAGNOSIS — N182 Chronic kidney disease, stage 2 (mild): Secondary | ICD-10-CM

## 2015-04-03 DIAGNOSIS — N184 Chronic kidney disease, stage 4 (severe): Secondary | ICD-10-CM

## 2015-04-03 DIAGNOSIS — R748 Abnormal levels of other serum enzymes: Secondary | ICD-10-CM

## 2015-04-03 DIAGNOSIS — N183 Chronic kidney disease, stage 3 (moderate): Secondary | ICD-10-CM

## 2015-04-03 DIAGNOSIS — I129 Hypertensive chronic kidney disease with stage 1 through stage 4 chronic kidney disease, or unspecified chronic kidney disease: Secondary | ICD-10-CM

## 2015-04-03 DIAGNOSIS — N185 Chronic kidney disease, stage 5: Secondary | ICD-10-CM | POA: Diagnosis not present

## 2015-04-03 DIAGNOSIS — N181 Chronic kidney disease, stage 1: Secondary | ICD-10-CM

## 2015-04-03 DIAGNOSIS — E1122 Type 2 diabetes mellitus with diabetic chronic kidney disease: Secondary | ICD-10-CM

## 2015-04-03 LAB — COMPREHENSIVE METABOLIC PANEL
ALBUMIN: 4.2 g/dL (ref 3.6–5.1)
ALT: 18 U/L (ref 9–46)
AST: 21 U/L (ref 10–35)
Alkaline Phosphatase: 75 U/L (ref 40–115)
BUN: 25 mg/dL (ref 7–25)
CALCIUM: 9.2 mg/dL (ref 8.6–10.3)
CHLORIDE: 106 mmol/L (ref 98–110)
CO2: 28 mmol/L (ref 20–31)
Creat: 1.74 mg/dL — ABNORMAL HIGH (ref 0.70–1.25)
Glucose, Bld: 127 mg/dL — ABNORMAL HIGH (ref 65–99)
Potassium: 4.4 mmol/L (ref 3.5–5.3)
Sodium: 141 mmol/L (ref 135–146)
Total Bilirubin: 0.5 mg/dL (ref 0.2–1.2)
Total Protein: 6.3 g/dL (ref 6.1–8.1)

## 2015-04-03 LAB — POCT GLYCOSYLATED HEMOGLOBIN (HGB A1C): Hemoglobin A1C: 6

## 2015-04-03 LAB — GLUCOSE, POCT (MANUAL RESULT ENTRY): POC Glucose: 130 mg/dl — AB (ref 70–99)

## 2015-04-03 MED ORDER — PRAVASTATIN SODIUM 40 MG PO TABS: ORAL_TABLET | ORAL | Status: DC

## 2015-04-03 MED ORDER — HYDROCHLOROTHIAZIDE 25 MG PO TABS: ORAL_TABLET | ORAL | Status: DC

## 2015-04-03 MED ORDER — LOSARTAN POTASSIUM 100 MG PO TABS: 100.0000 mg | ORAL_TABLET | Freq: Every day | ORAL | Status: DC

## 2015-04-03 NOTE — Patient Instructions (Signed)
I will contact you with your lab results as soon as they are available.   If you have not heard from me in 2 weeks, please contact me.  The fastest way to get your results is to register for My Chart (see the instructions on the last page of this printout).   

## 2015-04-03 NOTE — Progress Notes (Signed)
[name], male    DOB: Jun 24, 1951, 64 y.o.   MRN: [id number]  PCP: Chrishawna Farina, PA-C  Subjective:   Chief Complaint  Patient presents with  . Follow-up    DIABETES  . Medication Refill    PEND    HPI Presents for evaluation of diabetes and HTN.  His glucose has been elevated and required sliding scale insulin with his treatments. Most recently, CT scan revealed resolution of lymphadenopathy but bilateral pulmonary infiltrates. Pulmonology is evaluating this issue and he will follow-up in about 10 days.  In general, he feels well, though they've been dealt a blow-his wife's breast cancer has recurred with mets "all over." She completed her treatment last summer, just before his lymphoma diagnosis. Her next evaluation will be at [facility].  They will be spending a long weekend in [facility], leaving when he leaves here.   Review of Systems  Constitutional: Negative.   HENT: Negative.   Eyes: Negative for photophobia and visual disturbance.  Respiratory: Negative for cough, chest tightness and wheezing.   Cardiovascular: Negative for chest pain and palpitations.  Gastrointestinal: Negative for nausea, abdominal pain, diarrhea and constipation.  Endocrine: Negative.   Genitourinary: Negative for dysuria, urgency, frequency and hematuria.  Musculoskeletal: Negative for myalgias and arthralgias.  Neurological: Negative for dizziness, weakness and headaches.  Hematological: Negative for adenopathy.       Patient Active Problem List   Diagnosis Date Noted  . Pulmonary infiltrate present on computed tomography 02/20/2015  . Reactive thrombocytosis 01/09/2015  . Elevated liver enzymes 01/09/2015  . Leukocytosis 12/19/2014  . Thrombocytopenia due to drugs 12/11/2014  . Diffuse large B cell lymphoma 11/04/2014  . Anemia in neoplastic disease 11/04/2014  . Hyperlipidemia 05/11/2013  . Hypertensive renal disease   . DM (diabetes mellitus), type 2 with renal  complications   . Chronic kidney disease, stage III (moderate)   . History of tobacco abuse   . Erectile dysfunction      Prior to Admission medications   Medication Sig Start Date End Date Taking? Authorizing Provider  amLODipine (NORVASC) 10 MG tablet TAKE ONE TABLET BY MOUTH ONCE DAILY 03/27/15  Yes Joya Willmott, PA-C  aspirin 81 MG tablet Take 81 mg by mouth daily.   Yes Historical Provider, MD  fish oil-omega-3 fatty acids 1000 MG capsule Take 1 g by mouth daily.   Yes Historical Provider, MD  glipiZIDE (GLUCOTROL XL) 10 MG 24 hr tablet TAKE ONE TABLET BY MOUTH ONCE DAILY 03/27/15  Yes Reizy Dunlow, PA-C  hydrochlorothiazide (HYDRODIURIL) 25 MG tablet TAKE ONE TABLET BY MOUTH ONCE DAILY 04/03/15  Yes Bobette Leyh, PA-C  losartan (COZAAR) 100 MG tablet Take 1 tablet (100 mg total) by mouth daily. 04/03/15  Yes Donatella Walski, PA-C  pravastatin (PRAVACHOL) 40 MG tablet TAKE ONE TABLET BY MOUTH ONCE DAILY 04/03/15  Yes Gearl Baratta, PA-C  sitaGLIPtin (JANUVIA) 50 MG tablet Take 1 tablet (50 mg total) by mouth daily. 03/27/15  Yes Byrd Rushlow, PA-C  glucose blood (ACCU-CHEK AVIVA PLUS) test strip Use as instructed 09/24/14   Harrison Mons, PA-C  lidocaine-prilocaine (EMLA) cream Apply to affected area once Patient not taking: Reported on 04/03/2015 11/21/14   Heath Lark, MD     Allergies  Allergen Reactions  . Ace Inhibitors Hives  . Rituximab Other (See Comments)    This medication caused him to sweat & shake       Objective:  Physical Exam  Constitutional: He is oriented to person,  place, and time. Vital signs are normal. He appears well-developed and well-nourished. He is active and cooperative. No distress.  BP 120/58 mmHg  Pulse 90  Temp(Src) 97.8 F (36.6 C) (Oral)  Resp 16  Ht '5\' 10"'$  (1.778 m)  Wt 153 lb 6.4 oz (69.582 kg)  BMI 22.01 kg/m2  HENT:  Head: Normocephalic and atraumatic.  Right Ear: Hearing normal.  Left Ear: Hearing normal.  Eyes: Conjunctivae are  normal. No scleral icterus.  Neck: Normal range of motion. Neck supple. No thyromegaly present.  Cardiovascular: Normal rate, regular rhythm and normal heart sounds.   Pulses:      Radial pulses are 2+ on the right side, and 2+ on the left side.  Pulmonary/Chest: Effort normal and breath sounds normal.  Lymphadenopathy:       Head (right side): No tonsillar, no preauricular, no posterior auricular and no occipital adenopathy present.       Head (left side): No tonsillar, no preauricular, no posterior auricular and no occipital adenopathy present.    He has no cervical adenopathy.       Right: No supraclavicular adenopathy present.       Left: No supraclavicular adenopathy present.  Neurological: He is alert and oriented to person, place, and time. No sensory deficit.  Skin: Skin is warm, dry and intact. No rash noted. No cyanosis or erythema. Nails show no clubbing.  Psychiatric: He has a normal mood and affect. His speech is normal and behavior is normal.           Assessment & Plan:   1. Type 2 diabetes mellitus with diabetic chronic kidney disease Await these labs. Will adjust treatment if needed.  - POCT glucose (manual entry) - POCT glycosylated hemoglobin (Hb A1C)  2. Hypertensive renal disease, stage 1-4 or unspecified chronic kidney disease Good blood pressure control. Continue treatment. - losartan (COZAAR) 100 MG tablet; Take 1 tablet (100 mg total) by mouth daily.  Dispense: 30 tablet; Refill: 5 - hydrochlorothiazide (HYDRODIURIL) 25 MG tablet; TAKE ONE TABLET BY MOUTH ONCE DAILY  Dispense: 30 tablet; Refill: 5  3. Hyperlipidemia Await lab results. - pravastatin (PRAVACHOL) 40 MG tablet; TAKE ONE TABLET BY MOUTH ONCE DAILY  Dispense: 30 tablet; Refill: 5  4. Elevated liver enzymes Await lab results. - Comprehensive metabolic panel   Return in about 3 months (around 07/03/2015).   Fara Chute, PA-C Physician Assistant-Certified [facility]   ADDENDUM: Results for orders placed or performed in visit on 04/03/15  POCT glucose (manual entry)  Result Value Ref Range   POC Glucose 130 (A) 70 - 99 mg/dl  POCT glycosylated hemoglobin (Hb A1C)  Result Value Ref Range   Hemoglobin A1C 6.0    Excellent diabetes control on current regimen. No changes needed.  Fara Chute, PA-C Certified Physician [facility] 11:38 AM

## 2015-04-23 ENCOUNTER — Ambulatory Visit (INDEPENDENT_AMBULATORY_CARE_PROVIDER_SITE_OTHER): Payer: Medicare Other | Admitting: Pulmonary Disease

## 2015-04-23 ENCOUNTER — Encounter: Payer: Self-pay | Admitting: Pulmonary Disease

## 2015-04-23 VITALS — BP 120/68 | HR 73 | Ht 71.0 in | Wt 155.0 lb

## 2015-04-23 DIAGNOSIS — J438 Other emphysema: Secondary | ICD-10-CM

## 2015-04-23 DIAGNOSIS — J984 Other disorders of lung: Secondary | ICD-10-CM | POA: Insufficient documentation

## 2015-04-23 DIAGNOSIS — J431 Panlobular emphysema: Secondary | ICD-10-CM | POA: Diagnosis not present

## 2015-04-23 DIAGNOSIS — R0600 Dyspnea, unspecified: Secondary | ICD-10-CM

## 2015-04-23 LAB — PULMONARY FUNCTION TEST
DL/VA % PRED: 61 %
DL/VA: 2.87 ml/min/mmHg/L
DLCO unc % pred: 39 %
DLCO unc: 13.15 ml/min/mmHg
FEF 25-75 Post: 1.41 L/sec
FEF 25-75 Pre: 1.34 L/sec
FEF2575-%Change-Post: 5 %
FEF2575-%PRED-POST: 51 %
FEF2575-%Pred-Pre: 49 %
FEV1-%CHANGE-POST: 0 %
FEV1-%PRED-PRE: 67 %
FEV1-%Pred-Post: 68 %
FEV1-PRE: 2.09 L
FEV1-Post: 2.1 L
FEV1FVC-%Change-Post: 0 %
FEV1FVC-%PRED-PRE: 93 %
FEV6-%Change-Post: 0 %
FEV6-%Pred-Post: 74 %
FEV6-%Pred-Pre: 74 %
FEV6-POST: 2.9 L
FEV6-PRE: 2.88 L
FEV6FVC-%Change-Post: 0 %
FEV6FVC-%PRED-POST: 103 %
FEV6FVC-%PRED-PRE: 103 %
FVC-%Change-Post: 1 %
FVC-%PRED-POST: 72 %
FVC-%PRED-PRE: 71 %
FVC-POST: 2.93 L
FVC-PRE: 2.9 L
POST FEV1/FVC RATIO: 72 %
PRE FEV6/FVC RATIO: 99 %
Post FEV6/FVC ratio: 99 %
Pre FEV1/FVC ratio: 72 %
RV % pred: 78 %
RV: 1.92 L
TLC % pred: 67 %
TLC: 4.87 L

## 2015-04-23 NOTE — Progress Notes (Signed)
Noted and recorded in office visit note today.

## 2015-04-23 NOTE — Progress Notes (Signed)
PFT done today. 

## 2015-04-23 NOTE — Patient Instructions (Signed)
1. Remember to go to the lab [facility] tomorrow to get your blood test done for the genetic form of COPD/emphysema. 2. Please call me if you have any new breathing problems before your next appointment. 3. I will see you back in 3 months with breathing tests at that time.

## 2015-04-23 NOTE — Progress Notes (Signed)
Subjective:    [name], male    DOB: 02/07/1951, 64 y.o.   MRN: [id number]  [facility].:  Follow-up for  Bilateral Opacities & Emphysema.  HPI Bilateral Opacities: No evidence for ILD or progression of opacity on high-resolution CT scan. Given the resolution these favor an inflammatory process.  Emphysema: No evidence of COPD on spirometry today. Patient does have mild restriction on lung volumes. Alpha-1 testing was ordered at last appointment but never completed. He denies any dyspnea, coughing, or wheezing.  Review of Systems  Denies any chest pain or pressure. No fever, chills, or sweats.   Allergies  Allergen Reactions  . Ace Inhibitors Hives  . Rituximab Other (See Comments)    This medication caused him to sweat & shake   Current Outpatient Prescriptions on File Prior to Visit  Medication Sig Dispense Refill  . amLODipine (NORVASC) 10 MG tablet TAKE ONE TABLET BY MOUTH ONCE DAILY 90 tablet 3  . aspirin 81 MG tablet Take 81 mg by mouth daily.    . fish oil-omega-3 fatty acids 1000 MG capsule Take 1 g by mouth daily.    Marland Kitchen glipiZIDE (GLUCOTROL XL) 10 MG 24 hr tablet TAKE ONE TABLET BY MOUTH ONCE DAILY 90 tablet 3  . glucose blood (ACCU-CHEK AVIVA PLUS) test strip Use as instructed 100 each 4  . hydrochlorothiazide (HYDRODIURIL) 25 MG tablet TAKE ONE TABLET BY MOUTH ONCE DAILY 30 tablet 5  . lidocaine-prilocaine (EMLA) cream Apply to affected area once 30 g 3  . losartan (COZAAR) 100 MG tablet Take 1 tablet (100 mg total) by mouth daily. 30 tablet 5  . pravastatin (PRAVACHOL) 40 MG tablet TAKE ONE TABLET BY MOUTH ONCE DAILY 30 tablet 5  . sitaGLIPtin (JANUVIA) 50 MG tablet Take 1 tablet (50 mg total) by mouth daily. 90 tablet 3   No current facility-administered medications on file prior to visit.   Past Medical History  Diagnosis Date  . Essential hypertension, benign   . Type II or unspecified type diabetes mellitus with renal manifestations, not stated as  uncontrolled   . Chronic kidney disease, stage III (moderate)   . History of tobacco abuse     quit 12/2008  . History of ETOH abuse     quit 1999  . Erectile dysfunction   . Shortness of breath dyspnea     exertion  . Pneumonia   . Anemia   . Cramps, extremity     hx of in legs bilat; currently having issues in toes bilat   . Hyperparathyroidism     secondary / renal per H&P with [facility] 07/17/2014   . Cancer 2016    lymphoma  . Pulmonary infiltrate present on computed tomography 02/20/2015  . Diabetes    Past Surgical History  Procedure Laterality Date  . Arm wound repair / closure      chainsaw; left forearm/wrist  . Colonscopy     . Axillary lymph node biopsy Right 11/19/2014    Procedure: EXCISIONAL BIOPSY DEEP RIGHT AXILLARY LYMPH NODE ;  Surgeon: Fanny Skates, MD;  Location: [facility];  Service: General;  Laterality: Right;  . Portacath placement N/A 11/19/2014    Procedure: INSERTION PORT-A-CATH;  Surgeon: Fanny Skates, MD;  Location: [facility];  Service: General;  Laterality: N/A;   Family History  Problem Relation Age of Onset  . Cancer Father     colon  . Cancer Sister     Breast  . Stroke Brother   .  Stroke Brother   . Lung disease Neg Hx   . Rheumatologic disease Neg Hx    Social History   Social History  . Marital Status: Married    Spouse Name: [name]  . Number of Children: 2  . Years of Education: 11   Occupational History  . retired     Location manager   Social History Main Topics  . Smoking status: Former Smoker -- 1.00 packs/day for 50 years    Types: Cigarettes    Quit date: 07/05/2005  . Smokeless tobacco: Never Used  . Alcohol Use: No     Comment: hx of ETOH use; quit drinking approx 2008   . Drug Use: No  . Sexual Activity:    Partners: Female   Other Topics Concern  . Not on file   Social History Narrative   Lives with wife. She was diagnosed with stage III breast cancer 03/2014. She completed chemotherapy and had  a mastectomy, and radiation therapy 09/2014. In 03/2015, her cancer recurred "everywhere."      Plain Pulmonary:   He is originally from [facility]. He has always lived in [place]. 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 No known TB exposure. No known asbestos exposure. He has traveled to [place], [place], [place], & [place]. 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 No pets currently. Previously had cats and a dog. No bird exposure. No mold exposure. No hot tub exposure.      Objective:   Physical Exam Blood pressure 120/68, pulse 73, height '5\' 11"'$  (1.803 m), weight 155 lb (70.308 kg), SpO2 100 %. General:  Awake. Alert. No acute distress.  Integument:  Warm & dry. No rash on exposed skin. No bruising. Healed surgical scar right wrist. HEENT:  Moist mucus membranes. No oral ulcers. No scleral injection or icterus. PERRL. Cardiovascular:  Regular rate. Trace bilateral lower extremity edema. No appreciable JVD.  Pulmonary:  Good aeration & clear to auscultation bilaterally. Symmetric chest wall expansion. No accessory muscle use on room air. Abdomen: Soft. Normal bowel sounds. Nondistended. Grossly nontender. Musculoskeletal:  Normal bulk and tone. Hand grip strength 5/5 bilaterally. No joint deformity or effusion appreciated.  PFT Gen./19/16: FVC 2.90 L (71%) FEV1 2.09 L (67%) FEV1/FVC 0.72 FEF 25-75 1.34 L (49%) no bronchodilator response TLC 4.87 L (67%) RV 78% ERV 109% DLCO uncorrected 39%  6MWT 04/23/15:  Walked 456 meters / Baseline Sat 99% on RA / Nadir Sat 97% on RA   IMAGING HRCT CHEST W/O 03/21/15 (personally reviewed by me): Small pericardial effusion. Mild diffuse bronchial wall thickening & centrilobular with paraseptal emphysema. No intralobular septal thickening or honeycombing changes to suggest ILD. No mass appreciated. No mediastinal mass appreciated. No pleural effusion or thickening. Nodule within superior segment of  the right lower lobe has decreased in size. No bronchiectasis.  PET CT 02/12/15 (previously reviewed by me): No hypermetabolic mediastinal or hilar lymph nodes. No suspicious pulmonary nodules on CT imaging. Small pericardial effusion. Mild diffuse bronchial wall thickening with centrilobular and paraseptal emphysema that is apical predominant with some subpleural bleb formation. Interval increase in diffuse groundglass throughout the lungs bilaterally with some associated septal thickening predominantly in the bases. No pleural effusion or thickening appreciated. Spleen normal in size with persistent hypermetabolism. No hypermetabolic lymph nodes within the abdomen or pelvis. Diffuse hypermetabolism throughout the entire skeleton possibly related to G-CSF therapy.  CARDIAC TTE (11/27/14):6 LV normal in size. EF 55-60%. Normal LV wall motion. Grade 1 diastolic dysfunction. LA & RA normal in size. RV normal in size and function. Pulmonary artery systolic pressure 20 mmHg. Trivial aortic regurgitation without stenosis. Mild mitral regurgitation without stenosis. No pulmonic regurgitation or stenosis. Mild tricuspid regurgitation. Mild circumferential pericardial effusion without tamponade physiology.  PATHOLOGY FLOW CYTOMETRY (11/19/14): Atypical lymphocyte population with limited tissue for evaluation. 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 The patient's previously noted opacities on imaging have nearly completely and totally resolved. His pulmonary function testing today does show some mild restriction as well as a substantially decreased carbon dioxide diffusion capacity which is likely multifactorial with some mild central weight gain as well as his underlying emphysema.  Even so he has no oxygen requirement on his 6 minute walk test today. Patient was unable to get his alpha-1 antitrypsin testing at last appointment as our lab had no one to access his port.  I instructed the patient to notify my office if he had any new breathing problems before his next appointment as I would be happy to see him sooner.   1. Bilateral opacities: Resolved. Likely secondary to acute/subacute pneumonitis from rituximab. 2. Panlobular emphysema: Plan to screen for alpha-1 antitrypsin tomorrow at the [facility] lab. 3. Mild restrictive lung disease: Likely multifactorial. No evidence of ILD on high-resolution CT scan. Plan to repeat spirometry with DLCO next appointment. 4. Follow-up: Patient to return to clinic in 3 months or sooner if needed.

## 2015-04-24 ENCOUNTER — Ambulatory Visit (HOSPITAL_BASED_OUTPATIENT_CLINIC_OR_DEPARTMENT_OTHER): Payer: Medicare Other

## 2015-04-24 ENCOUNTER — Telehealth: Payer: Self-pay | Admitting: Hematology and Oncology

## 2015-04-24 ENCOUNTER — Encounter: Payer: Self-pay | Admitting: Hematology and Oncology

## 2015-04-24 ENCOUNTER — Ambulatory Visit (HOSPITAL_BASED_OUTPATIENT_CLINIC_OR_DEPARTMENT_OTHER): Payer: Medicare Other | Admitting: Hematology and Oncology

## 2015-04-24 VITALS — BP 124/58 | HR 80 | Temp 98.4°F | Resp 18 | Ht 71.0 in | Wt 155.1 lb

## 2015-04-24 DIAGNOSIS — J984 Other disorders of lung: Secondary | ICD-10-CM | POA: Diagnosis not present

## 2015-04-24 DIAGNOSIS — C8338 Diffuse large B-cell lymphoma, lymph nodes of multiple sites: Secondary | ICD-10-CM

## 2015-04-24 DIAGNOSIS — N183 Chronic kidney disease, stage 3 unspecified: Secondary | ICD-10-CM

## 2015-04-24 LAB — COMPREHENSIVE METABOLIC PANEL (CC13)
ALBUMIN: 3.9 g/dL (ref 3.5–5.0)
ALK PHOS: 79 U/L (ref 40–150)
ALT: 25 U/L (ref 0–55)
ANION GAP: 7 meq/L (ref 3–11)
AST: 23 U/L (ref 5–34)
BILIRUBIN TOTAL: 0.32 mg/dL (ref 0.20–1.20)
BUN: 32.6 mg/dL — ABNORMAL HIGH (ref 7.0–26.0)
CALCIUM: 9.6 mg/dL (ref 8.4–10.4)
CO2: 26 mEq/L (ref 22–29)
Chloride: 109 mEq/L (ref 98–109)
Creatinine: 1.9 mg/dL — ABNORMAL HIGH (ref 0.7–1.3)
EGFR: 41 mL/min/{1.73_m2} — AB (ref >90)
Glucose: 116 mg/dl (ref 70–140)
Potassium: 4.3 mEq/L (ref 3.5–5.1)
Sodium: 142 mEq/L (ref 136–145)
TOTAL PROTEIN: 6.6 g/dL (ref 6.4–8.3)

## 2015-04-24 LAB — CBC WITH DIFFERENTIAL/PLATELET
BASO%: 0.4 % (ref 0.0–2.0)
BASOS ABS: 0 10*3/uL (ref 0.0–0.1)
EOS ABS: 0.1 10*3/uL (ref 0.0–0.5)
EOS%: 1.5 % (ref 0.0–7.0)
HCT: 37.5 % — ABNORMAL LOW (ref 38.4–49.9)
HGB: 12.4 g/dL — ABNORMAL LOW (ref 13.0–17.1)
LYMPH#: 1.2 10*3/uL (ref 0.9–3.3)
LYMPH%: 13.8 % — AB (ref 14.0–49.0)
MCH: 29.3 pg (ref 27.2–33.4)
MCHC: 33 g/dL (ref 32.0–36.0)
MCV: 88.6 fL (ref 79.3–98.0)
MONO#: 0.7 10*3/uL (ref 0.1–0.9)
MONO%: 8.1 % (ref 0.0–14.0)
NEUT#: 6.7 10*3/uL — ABNORMAL HIGH (ref 1.5–6.5)
NEUT%: 76.2 % — ABNORMAL HIGH (ref 39.0–75.0)
PLATELETS: 223 10*3/uL (ref 140–400)
RBC: 4.24 10*6/uL (ref 4.20–5.82)
RDW: 14.1 % (ref 11.0–14.6)
WBC: 8.8 10*3/uL (ref 4.0–10.3)

## 2015-04-24 LAB — LACTATE DEHYDROGENASE (CC13): LDH: 184 U/L (ref 125–245)

## 2015-04-24 NOTE — Progress Notes (Signed)
[facility] OFFICE PROGRESS NOTE  Patient Care Team: Harrison Mons, PA-C as PCP - General (Physician Assistant) Corliss Parish, MD as Consulting Physician (Nephrology) Fanny Skates, MD as Consulting Physician (General Surgery) Heath Lark, MD as Consulting Physician (Hematology and Oncology)  SUMMARY OF ONCOLOGIC HISTORY: Oncology History   Lymphoma   Staging form: Lymphoid Neoplasms, AJCC 6th Edition     Clinical stage from 11/21/2014: Stage IV - Signed by Heath Lark, MD on 11/21/2014       Diffuse large B cell lymphoma ([place])   11/13/2014 Imaging PEt scan showed widespread disease including possible splenic involvement   11/19/2014 Surgery He has excisional LN biopsy and port placement   11/19/2014 Pathology Results Biopsy of lymph nodes showed DLBCL   11/29/2014 - 01/31/2015 Chemotherapy He received 4 cycles of RCHOP chemotherapy complicated by severe infusion reaction.   12/19/2014 - 01/09/2015 Chemotherapy He received 2 cycles of prophylactic IT chemotherapy.   01/31/2015 Adverse Reaction I reduce the dose of his chemotherapy, prednisone and dexamethasone due to poorly controlled diabetes and hyperglycemia   02/12/2015 Imaging  repeat PET CT scan show complete response to chemotherapy but new lung infiltrates    INTERVAL HISTORY: Please see below for problem oriented charting. He returns for further follow-up. Denies new lymphadenopathy. Denies recent cough, chest pain or shortness of breath.  REVIEW OF SYSTEMS:   Constitutional: Denies fevers, chills or abnormal weight loss Eyes: Denies blurriness of vision Ears, nose, mouth, throat, and face: Denies mucositis or sore throat Respiratory: Denies cough, dyspnea or wheezes Cardiovascular: Denies palpitation, chest discomfort or lower extremity swelling Gastrointestinal:  Denies nausea, heartburn or change in bowel habits Skin: Denies abnormal skin rashes Lymphatics: Denies new lymphadenopathy or easy  bruising Neurological:Denies numbness, tingling or new weaknesses Behavioral/Psych: Mood is stable, no new changes  All other systems were reviewed with the patient and are negative.  I have reviewed the past medical history, past surgical history, social history and family history with the patient and they are unchanged from previous note.  ALLERGIES:  is allergic to ace inhibitors and rituximab.  MEDICATIONS:  Current Outpatient Prescriptions  Medication Sig Dispense Refill  . amLODipine (NORVASC) 10 MG tablet TAKE ONE TABLET BY MOUTH ONCE DAILY 90 tablet 3  . aspirin 81 MG tablet Take 81 mg by mouth daily.    . fish oil-omega-3 fatty acids 1000 MG capsule Take 1 g by mouth daily.    Marland Kitchen glipiZIDE (GLUCOTROL XL) 10 MG 24 hr tablet TAKE ONE TABLET BY MOUTH ONCE DAILY 90 tablet 3  . glucose blood (ACCU-CHEK AVIVA PLUS) test strip Use as instructed 100 each 4  . hydrochlorothiazide (HYDRODIURIL) 25 MG tablet TAKE ONE TABLET BY MOUTH ONCE DAILY 30 tablet 5  . losartan (COZAAR) 100 MG tablet Take 1 tablet (100 mg total) by mouth daily. 30 tablet 5  . pravastatin (PRAVACHOL) 40 MG tablet TAKE ONE TABLET BY MOUTH ONCE DAILY 30 tablet 5  . sitaGLIPtin (JANUVIA) 50 MG tablet Take 1 tablet (50 mg total) by mouth daily. 90 tablet 3   No current facility-administered medications for this visit.    PHYSICAL EXAMINATION: ECOG PERFORMANCE STATUS: 0 - Asymptomatic  Filed Vitals:   04/24/15 0917  BP: 124/58  Pulse: 80  Temp: 98.4 F (36.9 C)  Resp: 18   Filed Weights   04/24/15 0917  Weight: 155 lb 1.6 oz (70.353 kg)    GENERAL:alert, no distress and comfortable SKIN: skin color, texture, turgor are normal, no rashes  or significant lesions EYES: normal, Conjunctiva are pink and non-injected, sclera clear OROPHARYNX:no exudate, no erythema and lips, buccal mucosa, and tongue normal  NECK: supple, thyroid normal size, non-tender, without nodularity LYMPH:  no palpable lymphadenopathy in  the cervical, axillary or inguinal LUNGS: clear to auscultation and percussion with normal breathing effort HEART: regular rate & rhythm and no murmurs and no lower extremity edema ABDOMEN:abdomen soft, non-tender and normal bowel sounds Musculoskeletal:no cyanosis of digits and no clubbing  NEURO: alert & oriented x 3 with fluent speech, no focal motor/sensory deficits  LABORATORY DATA:  I have reviewed the data as listed    Component Value Date/Time   NA 141 04/03/2015 0824   NA 140 03/18/2015 0837   NA 139 07/17/2014   K 4.4 04/03/2015 0824   K 4.2 03/18/2015 0837   CL 106 04/03/2015 0824   CO2 28 04/03/2015 0824   CO2 26 03/18/2015 0837   GLUCOSE 127* 04/03/2015 0824   GLUCOSE 176* 03/18/2015 0837   BUN 25 04/03/2015 0824   BUN 25.1 03/18/2015 0837   BUN 26* 07/17/2014   CREATININE 1.74* 04/03/2015 0824   CREATININE 1.7* 03/18/2015 0837   CREATININE 2.60* 11/23/2014 0314   CREATININE 2.1* 07/17/2014   CALCIUM 9.2 04/03/2015 0824   CALCIUM 9.0 03/18/2015 0837   PROT 6.3 04/03/2015 0824   PROT 5.7* 03/18/2015 0837   ALBUMIN 4.2 04/03/2015 0824   ALBUMIN 3.3* 03/18/2015 0837   AST 21 04/03/2015 0824   AST 19 03/18/2015 0837   ALT 18 04/03/2015 0824   ALT 19 03/18/2015 0837   ALKPHOS 75 04/03/2015 0824   ALKPHOS 73 03/18/2015 0837   BILITOT 0.5 04/03/2015 0824   BILITOT 0.43 03/18/2015 0837   GFRNONAA 26* 11/22/2014 2357   GFRNONAA 33* 09/11/2013 0920   GFRAA 30* 11/22/2014 2357   GFRAA 38* 09/11/2013 0920    No results found for: SPEP, UPEP  Lab Results  Component Value Date   WBC 8.2 03/18/2015   NEUTROABS 6.7* 03/18/2015   HGB 10.5* 03/18/2015   HCT 31.8* 03/18/2015   MCV 89.3 03/18/2015   PLT 189 03/18/2015      Chemistry      Component Value Date/Time   NA 141 04/03/2015 0824   NA 140 03/18/2015 0837   NA 139 07/17/2014   K 4.4 04/03/2015 0824   K 4.2 03/18/2015 0837   CL 106 04/03/2015 0824   CO2 28 04/03/2015 0824   CO2 26 03/18/2015 0837    BUN 25 04/03/2015 0824   BUN 25.1 03/18/2015 0837   BUN 26* 07/17/2014   CREATININE 1.74* 04/03/2015 0824   CREATININE 1.7* 03/18/2015 0837   CREATININE 2.60* 11/23/2014 0314   CREATININE 2.1* 07/17/2014   GLU 382* 01/31/2015 1551      Component Value Date/Time   CALCIUM 9.2 04/03/2015 0824   CALCIUM 9.0 03/18/2015 0837   ALKPHOS 75 04/03/2015 0824   ALKPHOS 73 03/18/2015 0837   AST 21 04/03/2015 0824   AST 19 03/18/2015 0837   ALT 18 04/03/2015 0824   ALT 19 03/18/2015 0837   BILITOT 0.5 04/03/2015 0824   BILITOT 0.43 03/18/2015 0837      ASSESSMENT & PLAN:  Diffuse large B cell lymphoma The patient tolerated treatment poorly with side effects of poorly controlled diabetes and renal failure. Most recently, there was concern about pulmonary infiltrates. I'm not comfortable to give the patient 2 more cycles of R-CHOP chemotherapy. Even though rituximab does not improve overall survival, there  is a role for delay in progression free survival if given as a maintenance treatment. In this patient will could not tolerate further chemotherapy, I felt that rituximab would be a good choice for him. I discussed with him the risks, benefits, side effects of rituximab and he agreed to proceed. We will start for cycle of treatment next week and then I will see him back prior to cycle 2 of treatment.  Chronic kidney disease, stage III (moderate) He has chronic kidney disease and this is unrelated to the lymphoma.  Continue close monitoring. he will continue current medical management. I recommend close follow-up with primary care doctor for medication adjustment.    Restrictive lung disease Per patient request, I will order alpha 1 anti-trypsin level. Because of the infiltrate seen on CT chest which was thought to be related to side effects of chemotherapy, he will not receive further treatment with chemotherapy. He is doing well right now without the need for oxygen therapy and will  follow with pulmonologist closely in the future.   Orders Placed This Encounter  Procedures  . Alpha-1-antitrypsin    Standing Status: Future     Number of Occurrences:      Standing Expiration Date: 04/23/2016  . Comprehensive metabolic panel    Standing Status: Standing     Number of Occurrences: 9     Standing Expiration Date: 04/23/2016  . CBC with Differential/Platelet    Standing Status: Standing     Number of Occurrences: 9     Standing Expiration Date: 04/23/2016  . Lactate dehydrogenase    Standing Status: Standing     Number of Occurrences: 9     Standing Expiration Date: 04/23/2016   All questions were answered. The patient knows to call the clinic with any problems, questions or concerns. No barriers to learning was detected. I spent 20 minutes counseling the patient face to face. The total time spent in the appointment was 25 minutes and more than 50% was on counseling and review of test results     [facility], [place], MD 04/24/2015 9:38 AM

## 2015-04-24 NOTE — Assessment & Plan Note (Signed)
Per patient request, I will order alpha 1 anti-trypsin level. Because of the infiltrate seen on CT chest which was thought to be related to side effects of chemotherapy, he will not receive further treatment with chemotherapy. He is doing well right now without the need for oxygen therapy and will follow with pulmonologist closely in the future.

## 2015-04-24 NOTE — Assessment & Plan Note (Signed)
The patient tolerated treatment poorly with side effects of poorly controlled diabetes and renal failure. Most recently, there was concern about pulmonary infiltrates. I'm not comfortable to give the patient 2 more cycles of R-CHOP chemotherapy. Even though rituximab does not improve overall survival, there is a role for delay in progression free survival if given as a maintenance treatment. In this patient will could not tolerate further chemotherapy, I felt that rituximab would be a good choice for him. I discussed with him the risks, benefits, side effects of rituximab and he agreed to proceed. We will start for cycle of treatment next week and then I will see him back prior to cycle 2 of treatment.

## 2015-04-24 NOTE — Telephone Encounter (Signed)
GAVE ADN PRINTED APPT SCHED AND AVS FOR PT FOR oct AND DEC

## 2015-04-24 NOTE — Assessment & Plan Note (Signed)
He has chronic kidney disease and this is unrelated to the lymphoma.  Continue close monitoring. he will continue current medical management. I recommend close follow-up with primary care doctor for medication adjustment. 

## 2015-05-02 ENCOUNTER — Ambulatory Visit (HOSPITAL_BASED_OUTPATIENT_CLINIC_OR_DEPARTMENT_OTHER): Payer: Medicare Other

## 2015-05-02 VITALS — BP 115/52 | HR 71 | Temp 98.0°F | Resp 16

## 2015-05-02 DIAGNOSIS — C8338 Diffuse large B-cell lymphoma, lymph nodes of multiple sites: Secondary | ICD-10-CM | POA: Diagnosis not present

## 2015-05-02 DIAGNOSIS — Z5112 Encounter for antineoplastic immunotherapy: Secondary | ICD-10-CM | POA: Diagnosis not present

## 2015-05-02 MED ORDER — SODIUM CHLORIDE 0.9 % IV SOLN
375.0000 mg/m2 | Freq: Once | INTRAVENOUS | Status: AC
  Administered 2015-05-02: 700 mg via INTRAVENOUS
  Filled 2015-05-02: qty 70

## 2015-05-02 MED ORDER — ACETAMINOPHEN 325 MG PO TABS
ORAL_TABLET | ORAL | Status: AC
  Filled 2015-05-02: qty 2

## 2015-05-02 MED ORDER — HEPARIN SOD (PORK) LOCK FLUSH 100 UNIT/ML IV SOLN
500.0000 [IU] | Freq: Once | INTRAVENOUS | Status: AC | PRN
  Administered 2015-05-02: 500 [IU]
  Filled 2015-05-02: qty 5

## 2015-05-02 MED ORDER — SODIUM CHLORIDE 0.9 % IV SOLN
Freq: Once | INTRAVENOUS | Status: AC
  Administered 2015-05-02: 10:00:00 via INTRAVENOUS

## 2015-05-02 MED ORDER — SODIUM CHLORIDE 0.9 % IJ SOLN
10.0000 mL | INTRAMUSCULAR | Status: DC | PRN
  Administered 2015-05-02: 10 mL
  Filled 2015-05-02: qty 10

## 2015-05-02 MED ORDER — ACETAMINOPHEN 325 MG PO TABS
650.0000 mg | ORAL_TABLET | Freq: Once | ORAL | Status: AC
  Administered 2015-05-02: 650 mg via ORAL

## 2015-05-02 MED ORDER — DIPHENHYDRAMINE HCL 25 MG PO CAPS
ORAL_CAPSULE | ORAL | Status: AC
  Filled 2015-05-02: qty 2

## 2015-05-02 MED ORDER — DIPHENHYDRAMINE HCL 25 MG PO CAPS
50.0000 mg | ORAL_CAPSULE | Freq: Once | ORAL | Status: AC
  Administered 2015-05-02: 50 mg via ORAL

## 2015-05-02 NOTE — Patient Instructions (Signed)
[facility] Discharge Instructions for Patients Receiving Chemotherapy  Today you received the following chemotherapy agents rituxan  To help prevent nausea and vomiting after your treatment, we encourage you to take your nausea medication   If you develop nausea and vomiting that is not controlled by your nausea medication, call the clinic.   BELOW ARE SYMPTOMS THAT SHOULD BE REPORTED IMMEDIATELY:  *FEVER GREATER THAN 100.5 F  *CHILLS WITH OR WITHOUT FEVER  NAUSEA AND VOMITING THAT IS NOT CONTROLLED WITH YOUR NAUSEA MEDICATION  *UNUSUAL SHORTNESS OF BREATH  *UNUSUAL BRUISING OR BLEEDING  TENDERNESS IN MOUTH AND THROAT WITH OR WITHOUT PRESENCE OF ULCERS  *URINARY PROBLEMS  *BOWEL PROBLEMS  UNUSUAL RASH Items with * indicate a potential emergency and should be followed up as soon as possible.  Feel free to call the clinic you have any questions or concerns. The clinic phone number is [phone].  Please show the [facility] at check-in to the Emergency Department and triage nurse.

## 2015-05-13 ENCOUNTER — Ambulatory Visit (HOSPITAL_BASED_OUTPATIENT_CLINIC_OR_DEPARTMENT_OTHER): Payer: Medicare Other

## 2015-05-13 VITALS — BP 137/75 | HR 75 | Temp 98.4°F | Resp 18

## 2015-05-13 DIAGNOSIS — C8338 Diffuse large B-cell lymphoma, lymph nodes of multiple sites: Secondary | ICD-10-CM | POA: Diagnosis not present

## 2015-05-13 DIAGNOSIS — Z452 Encounter for adjustment and management of vascular access device: Secondary | ICD-10-CM | POA: Diagnosis not present

## 2015-05-13 DIAGNOSIS — Z95828 Presence of other vascular implants and grafts: Secondary | ICD-10-CM

## 2015-05-13 MED ORDER — SODIUM CHLORIDE 0.9 % IJ SOLN
10.0000 mL | INTRAMUSCULAR | Status: DC | PRN
  Administered 2015-05-13: 10 mL via INTRAVENOUS
  Filled 2015-05-13: qty 10

## 2015-05-13 MED ORDER — HEPARIN SOD (PORK) LOCK FLUSH 100 UNIT/ML IV SOLN
500.0000 [IU] | Freq: Once | INTRAVENOUS | Status: AC
  Administered 2015-05-13: 500 [IU] via INTRAVENOUS
  Filled 2015-05-13: qty 5

## 2015-05-13 NOTE — Patient Instructions (Signed)

## 2015-06-19 ENCOUNTER — Telehealth: Payer: Self-pay | Admitting: Family Medicine

## 2015-07-04 ENCOUNTER — Telehealth: Payer: Self-pay | Admitting: Hematology and Oncology

## 2015-07-04 ENCOUNTER — Ambulatory Visit (HOSPITAL_BASED_OUTPATIENT_CLINIC_OR_DEPARTMENT_OTHER): Payer: Medicare Other

## 2015-07-04 ENCOUNTER — Ambulatory Visit (HOSPITAL_BASED_OUTPATIENT_CLINIC_OR_DEPARTMENT_OTHER): Payer: Medicare Other | Admitting: Hematology and Oncology

## 2015-07-04 ENCOUNTER — Ambulatory Visit: Payer: Medicare Other

## 2015-07-04 ENCOUNTER — Encounter: Payer: Self-pay | Admitting: Hematology and Oncology

## 2015-07-04 VITALS — BP 139/58 | HR 74 | Temp 97.8°F | Resp 18 | Ht 71.0 in | Wt 156.2 lb

## 2015-07-04 VITALS — BP 112/59 | HR 78 | Temp 97.9°F | Resp 18

## 2015-07-04 VITALS — BP 125/67 | HR 75 | Temp 97.1°F | Resp 18

## 2015-07-04 DIAGNOSIS — C8338 Diffuse large B-cell lymphoma, lymph nodes of multiple sites: Secondary | ICD-10-CM

## 2015-07-04 DIAGNOSIS — Z5112 Encounter for antineoplastic immunotherapy: Secondary | ICD-10-CM

## 2015-07-04 DIAGNOSIS — N183 Chronic kidney disease, stage 3 unspecified: Secondary | ICD-10-CM

## 2015-07-04 DIAGNOSIS — J984 Other disorders of lung: Secondary | ICD-10-CM | POA: Diagnosis not present

## 2015-07-04 DIAGNOSIS — E119 Type 2 diabetes mellitus without complications: Secondary | ICD-10-CM

## 2015-07-04 LAB — COMPREHENSIVE METABOLIC PANEL
ALBUMIN: 3.9 g/dL (ref 3.5–5.0)
ALK PHOS: 80 U/L (ref 40–150)
ALT: 31 U/L (ref 0–55)
AST: 30 U/L (ref 5–34)
Anion Gap: 8 mEq/L (ref 3–11)
BILIRUBIN TOTAL: 0.56 mg/dL (ref 0.20–1.20)
BUN: 33 mg/dL — AB (ref 7.0–26.0)
CALCIUM: 9.1 mg/dL (ref 8.4–10.4)
CO2: 26 mEq/L (ref 22–29)
CREATININE: 2 mg/dL — AB (ref 0.7–1.3)
Chloride: 104 mEq/L (ref 98–109)
EGFR: 40 mL/min/{1.73_m2} — ABNORMAL LOW (ref >90)
Glucose: 143 mg/dl — ABNORMAL HIGH (ref 70–140)
Potassium: 3.9 mEq/L (ref 3.5–5.1)
Sodium: 139 mEq/L (ref 136–145)
Total Protein: 6.7 g/dL (ref 6.4–8.3)

## 2015-07-04 LAB — CBC WITH DIFFERENTIAL/PLATELET
BASO%: 1 % (ref 0.0–2.0)
Basophils Absolute: 0.1 10*3/uL (ref 0.0–0.1)
EOS ABS: 0.1 10*3/uL (ref 0.0–0.5)
EOS%: 0.9 % (ref 0.0–7.0)
HCT: 39.4 % (ref 38.4–49.9)
HGB: 13.1 g/dL (ref 13.0–17.1)
LYMPH%: 9.2 % — AB (ref 14.0–49.0)
MCH: 28.1 pg (ref 27.2–33.4)
MCHC: 33.4 g/dL (ref 32.0–36.0)
MCV: 84.3 fL (ref 79.3–98.0)
MONO#: 0.6 10*3/uL (ref 0.1–0.9)
MONO%: 6.2 % (ref 0.0–14.0)
NEUT%: 82.7 % — ABNORMAL HIGH (ref 39.0–75.0)
NEUTROS ABS: 7.7 10*3/uL — AB (ref 1.5–6.5)
Platelets: 218 10*3/uL (ref 140–400)
RBC: 4.67 10*6/uL (ref 4.20–5.82)
RDW: 13.5 % (ref 11.0–14.6)
WBC: 9.4 10*3/uL (ref 4.0–10.3)
lymph#: 0.9 10*3/uL (ref 0.9–3.3)

## 2015-07-04 LAB — LACTATE DEHYDROGENASE: LDH: 218 U/L (ref 125–245)

## 2015-07-04 MED ORDER — SODIUM CHLORIDE 0.9 % IJ SOLN
10.0000 mL | INTRAMUSCULAR | Status: DC | PRN
  Administered 2015-07-04: 10 mL
  Filled 2015-07-04: qty 10

## 2015-07-04 MED ORDER — DIPHENHYDRAMINE HCL 25 MG PO CAPS
50.0000 mg | ORAL_CAPSULE | Freq: Once | ORAL | Status: AC
Start: 2015-07-04 — End: 2015-07-04
  Administered 2015-07-04: 50 mg via ORAL

## 2015-07-04 MED ORDER — SODIUM CHLORIDE 0.9 % IJ SOLN
10.0000 mL | Freq: Once | INTRAMUSCULAR | Status: AC
  Administered 2015-07-04: 10 mL
  Filled 2015-07-04: qty 10

## 2015-07-04 MED ORDER — SODIUM CHLORIDE 0.9 % IV SOLN
375.0000 mg/m2 | Freq: Once | INTRAVENOUS | Status: AC
  Administered 2015-07-04: 700 mg via INTRAVENOUS
  Filled 2015-07-04: qty 70

## 2015-07-04 MED ORDER — ACETAMINOPHEN 325 MG PO TABS
650.0000 mg | ORAL_TABLET | Freq: Once | ORAL | Status: AC
  Administered 2015-07-04: 650 mg via ORAL

## 2015-07-04 MED ORDER — DIPHENHYDRAMINE HCL 25 MG PO CAPS
ORAL_CAPSULE | ORAL | Status: AC
  Filled 2015-07-04: qty 2

## 2015-07-04 MED ORDER — HEPARIN SOD (PORK) LOCK FLUSH 100 UNIT/ML IV SOLN
500.0000 [IU] | Freq: Once | INTRAVENOUS | Status: AC | PRN
  Administered 2015-07-04: 500 [IU]
  Filled 2015-07-04: qty 5

## 2015-07-04 MED ORDER — ACETAMINOPHEN 325 MG PO TABS
ORAL_TABLET | ORAL | Status: AC
  Filled 2015-07-04: qty 2

## 2015-07-04 MED ORDER — SODIUM CHLORIDE 0.9 % IV SOLN
Freq: Once | INTRAVENOUS | Status: AC
  Administered 2015-07-04: 11:00:00 via INTRAVENOUS

## 2015-07-04 NOTE — Telephone Encounter (Signed)
February appointments already complete per 12/30 pof. Gave patient avs report and appointments for February.

## 2015-07-04 NOTE — Progress Notes (Signed)
[facility] OFFICE PROGRESS NOTE  Patient Care Team: Harrison Mons, PA-C as PCP - General (Physician Assistant) Corliss Parish, MD as Consulting Physician (Nephrology) Fanny Skates, MD as Consulting Physician (General Surgery) Heath Lark, MD as Consulting Physician (Hematology and Oncology)  SUMMARY OF ONCOLOGIC HISTORY: Oncology History   Lymphoma   Staging form: Lymphoid Neoplasms, AJCC 6th Edition     Clinical stage from 11/21/2014: Stage IV - Signed by Heath Lark, MD on 11/21/2014       Diffuse large B cell lymphoma ([place])   11/13/2014 Imaging PEt scan showed widespread disease including possible splenic involvement   11/19/2014 Surgery He has excisional LN biopsy and port placement   11/19/2014 Pathology Results Biopsy of lymph nodes showed DLBCL   11/29/2014 - 01/31/2015 Chemotherapy He received 4 cycles of RCHOP chemotherapy complicated by severe infusion reaction.   12/19/2014 - 01/09/2015 Chemotherapy He received 2 cycles of prophylactic IT chemotherapy.   01/31/2015 Adverse Reaction I reduce the dose of his chemotherapy, prednisone and dexamethasone due to poorly controlled diabetes and hyperglycemia   02/12/2015 Imaging  repeat PET CT scan show complete response to chemotherapy but new lung infiltrates    INTERVAL HISTORY: Please see below for problem oriented charting.  he is seen today prior to treatment. The patient is sad that his wife is currently dying from cancer. He denies recent infection. No new lymphadenopathy. Denies recent cough or shortness of breath.  REVIEW OF SYSTEMS:   Constitutional: Denies fevers, chills or abnormal weight loss Eyes: Denies blurriness of vision Ears, nose, mouth, throat, and face: Denies mucositis or sore throat Respiratory: Denies cough, dyspnea or wheezes Cardiovascular: Denies palpitation, chest discomfort or lower extremity swelling Gastrointestinal:  Denies nausea, heartburn or change in bowel habits Skin: Denies  abnormal skin rashes Lymphatics: Denies new lymphadenopathy or easy bruising Neurological:Denies numbness, tingling or new weaknesses Behavioral/Psych: Mood is stable, no new changes  All other systems were reviewed with the patient and are negative.  I have reviewed the past medical history, past surgical history, social history and family history with the patient and they are unchanged from previous note.  ALLERGIES:  is allergic to ace inhibitors and rituximab.  MEDICATIONS:  Current Outpatient Prescriptions  Medication Sig Dispense Refill  . amLODipine (NORVASC) 10 MG tablet TAKE ONE TABLET BY MOUTH ONCE DAILY 90 tablet 3  . aspirin 81 MG tablet Take 81 mg by mouth daily.    . fish oil-omega-3 fatty acids 1000 MG capsule Take 1 g by mouth daily.    Marland Kitchen glipiZIDE (GLUCOTROL XL) 10 MG 24 hr tablet TAKE ONE TABLET BY MOUTH ONCE DAILY 90 tablet 3  . glucose blood (ACCU-CHEK AVIVA PLUS) test strip Use as instructed 100 each 4  . hydrochlorothiazide (HYDRODIURIL) 25 MG tablet TAKE ONE TABLET BY MOUTH ONCE DAILY 30 tablet 5  . losartan (COZAAR) 100 MG tablet Take 1 tablet (100 mg total) by mouth daily. 30 tablet 5  . pravastatin (PRAVACHOL) 40 MG tablet TAKE ONE TABLET BY MOUTH ONCE DAILY 30 tablet 5  . sitaGLIPtin (JANUVIA) 50 MG tablet Take 1 tablet (50 mg total) by mouth daily. 90 tablet 3   No current facility-administered medications for this visit.    PHYSICAL EXAMINATION: ECOG PERFORMANCE STATUS: 0 - Asymptomatic  Filed Vitals:   07/04/15 1010  BP: 139/58  Pulse: 74  Temp: 97.8 F (36.6 C)  Resp: 18   Filed Weights   07/04/15 1010  Weight: 156 lb 3.2 oz (70.852 kg)  GENERAL:alert, no distress and comfortable SKIN: skin color, texture, turgor are normal, no rashes or significant lesions EYES: normal, Conjunctiva are pink and non-injected, sclera clear OROPHARYNX:no exudate, no erythema and lips, buccal mucosa, and tongue normal  NECK: supple, thyroid normal size,  non-tender, without nodularity LYMPH:  no palpable lymphadenopathy in the cervical, axillary or inguinal LUNGS: clear to auscultation and percussion with normal breathing effort HEART: regular rate & rhythm and no murmurs and no lower extremity edema ABDOMEN:abdomen soft, non-tender and normal bowel sounds Musculoskeletal:no cyanosis of digits and no clubbing  NEURO: alert & oriented x 3 with fluent speech, no focal motor/sensory deficits  LABORATORY DATA:  I have reviewed the data as listed    Component Value Date/Time   NA 142 04/24/2015 1002   NA 141 04/03/2015 0824   NA 139 07/17/2014   K 4.3 04/24/2015 1002   K 4.4 04/03/2015 0824   CL 106 04/03/2015 0824   CO2 26 04/24/2015 1002   CO2 28 04/03/2015 0824   GLUCOSE 116 04/24/2015 1002   GLUCOSE 127* 04/03/2015 0824   BUN 32.6* 04/24/2015 1002   BUN 25 04/03/2015 0824   BUN 26* 07/17/2014   CREATININE 1.9* 04/24/2015 1002   CREATININE 1.74* 04/03/2015 0824   CREATININE 2.60* 11/23/2014 0314   CREATININE 2.1* 07/17/2014   CALCIUM 9.6 04/24/2015 1002   CALCIUM 9.2 04/03/2015 0824   PROT 6.6 04/24/2015 1002   PROT 6.3 04/03/2015 0824   ALBUMIN 3.9 04/24/2015 1002   ALBUMIN 4.2 04/03/2015 0824   AST 23 04/24/2015 1002   AST 21 04/03/2015 0824   ALT 25 04/24/2015 1002   ALT 18 04/03/2015 0824   ALKPHOS 79 04/24/2015 1002   ALKPHOS 75 04/03/2015 0824   BILITOT 0.32 04/24/2015 1002   BILITOT 0.5 04/03/2015 0824   GFRNONAA 26* 11/22/2014 2357   GFRNONAA 33* 09/11/2013 0920   GFRAA 30* 11/22/2014 2357   GFRAA 38* 09/11/2013 0920    No results found for: SPEP, UPEP  Lab Results  Component Value Date   WBC 9.4 07/04/2015   NEUTROABS 7.7* 07/04/2015   HGB 13.1 07/04/2015   HCT 39.4 07/04/2015   MCV 84.3 07/04/2015   PLT 218 07/04/2015      Chemistry      Component Value Date/Time   NA 142 04/24/2015 1002   NA 141 04/03/2015 0824   NA 139 07/17/2014   K 4.3 04/24/2015 1002   K 4.4 04/03/2015 0824   CL 106  04/03/2015 0824   CO2 26 04/24/2015 1002   CO2 28 04/03/2015 0824   BUN 32.6* 04/24/2015 1002   BUN 25 04/03/2015 0824   BUN 26* 07/17/2014   CREATININE 1.9* 04/24/2015 1002   CREATININE 1.74* 04/03/2015 0824   CREATININE 2.60* 11/23/2014 0314   CREATININE 2.1* 07/17/2014   GLU 382* 01/31/2015 1551      Component Value Date/Time   CALCIUM 9.6 04/24/2015 1002   CALCIUM 9.2 04/03/2015 0824   ALKPHOS 79 04/24/2015 1002   ALKPHOS 75 04/03/2015 0824   AST 23 04/24/2015 1002   AST 21 04/03/2015 0824   ALT 25 04/24/2015 1002   ALT 18 04/03/2015 0824   BILITOT 0.32 04/24/2015 1002   BILITOT 0.5 04/03/2015 0824      ASSESSMENT & PLAN:  Diffuse large B cell lymphoma The patient tolerated treatment poorly with side effects of poorly controlled diabetes and renal failure. Most recently, there was concern about pulmonary infiltrates. I'm not comfortable to give the patient 2  more cycles of R-CHOP chemotherapy. Even though rituximab does not improve overall survival, there is a role for delay in progression free survival if given as a maintenance treatment. In this patient will could not tolerate further chemotherapy, I felt that rituximab would be a good choice for him. I discussed with him the risks, benefits, side effects of rituximab and he agreed to proceed.  I will proceed with treatment today and repeat in 2 months. I plan to repeat imaging study in March 2017. Clinically he has no signs of active disease  Chronic kidney disease, stage III (moderate) He has chronic kidney disease and this is unrelated to the lymphoma.  Continue close monitoring. he will continue current medical management. I recommend close follow-up with primary care doctor for medication adjustment.      Restrictive lung disease Per patient request, I will order alpha 1 anti-trypsin level. Because of the infiltrate seen on CT chest which was thought to be related to side effects of chemotherapy, he will  not receive further treatment with chemotherapy. He is doing well right now without the need for oxygen therapy and will follow with pulmonologist closely in the future.   No orders of the defined types were placed in this encounter.   All questions were answered. The patient knows to call the clinic with any problems, questions or concerns. No barriers to learning was detected. I spent 15 minutes counseling the patient face to face. The total time spent in the appointment was 20 minutes and more than 50% was on counseling and review of test results     [facility], [place], MD 07/04/2015 10:21 AM

## 2015-07-04 NOTE — Patient Instructions (Signed)
Rituximab injection What is this medicine? RITUXIMAB (ri TUX i mab) is a monoclonal antibody. It is used commonly to treat non-Hodgkin lymphoma and other conditions. It is also used to treat rheumatoid arthritis (RA). In RA, this medicine slows the inflammatory process and help reduce joint pain and swelling. This medicine is often used with other cancer or arthritis medications. This medicine may be used for other purposes; ask your health care provider or pharmacist if you have questions. What should I tell my health care provider before I take this medicine? They need to know if you have any of these conditions: -blood disorders -heart disease -history of hepatitis B -infection (especially a virus infection such as chickenpox, cold sores, or herpes) -irregular heartbeat -kidney disease -lung or breathing disease, like asthma -lupus -an unusual or allergic reaction to rituximab, mouse proteins, other medicines, foods, dyes, or preservatives -pregnant or trying to get pregnant -breast-feeding How should I use this medicine? This medicine is for infusion into a vein. It is administered in a hospital or clinic by a specially trained health care professional. A special MedGuide will be given to you by the pharmacist with each prescription and refill. Be sure to read this information carefully each time. Talk to your pediatrician regarding the use of this medicine in children. This medicine is not approved for use in children. Overdosage: If you think you have taken too much of this medicine contact a poison control center or emergency room at once. NOTE: This medicine is only for you. Do not share this medicine with others. What if I miss a dose? It is important not to miss a dose. Call your doctor or health care professional if you are unable to keep an appointment. What may interact with this medicine? -cisplatin -medicines for blood pressure -some other medicines for  arthritis -vaccines This list may not describe all possible interactions. Give your health care provider a list of all the medicines, herbs, non-prescription drugs, or dietary supplements you use. Also tell them if you smoke, drink alcohol, or use illegal drugs. Some items may interact with your medicine. What should I watch for while using this medicine? Report any side effects that you notice during your treatment right away, such as changes in your breathing, fever, chills, dizziness or lightheadedness. These effects are more common with the first dose. Visit your prescriber or health care professional for checks on your progress. You will need to have regular blood work. Report any other side effects. The side effects of this medicine can continue after you finish your treatment. Continue your course of treatment even though you feel ill unless your doctor tells you to stop. Call your doctor or health care professional for advice if you get a fever, chills or sore throat, or other symptoms of a cold or flu. Do not treat yourself. This drug decreases your body's ability to fight infections. Try to avoid being around people who are sick. This medicine may increase your risk to bruise or bleed. Call your doctor or health care professional if you notice any unusual bleeding. Be careful brushing and flossing your teeth or using a toothpick because you may get an infection or bleed more easily. If you have any dental work done, tell your dentist you are receiving this medicine. Avoid taking products that contain aspirin, acetaminophen, ibuprofen, naproxen, or ketoprofen unless instructed by your doctor. These medicines may hide a fever. Do not become pregnant while taking this medicine. Women should inform their doctor if  they wish to become pregnant or think they might be pregnant. There is a potential for serious side effects to an unborn child. Talk to your health care professional or pharmacist for more  information. Do not breast-feed an infant while taking this medicine. What side effects may I notice from receiving this medicine? Side effects that you should report to your doctor or health care professional as soon as possible: -allergic reactions like skin rash, itching or hives, swelling of the face, lips, or tongue -low blood counts - this medicine may decrease the number of white blood cells, red blood cells and platelets. You may be at increased risk for infections and bleeding. -signs of infection - fever or chills, cough, sore throat, pain or difficulty passing urine -signs of decreased platelets or bleeding - bruising, pinpoint red spots on the skin, black, tarry stools, blood in the urine -signs of decreased red blood cells - unusually weak or tired, fainting spells, lightheadedness -breathing problems -confused, not responsive -chest pain -fast, irregular heartbeat -feeling faint or lightheaded, falls -mouth sores -redness, blistering, peeling or loosening of the skin, including inside the mouth -stomach pain -swelling of the ankles, feet, or hands -trouble passing urine or change in the amount of urine Side effects that usually do not require medical attention (report to your doctor or other health care professional if they continue or are bothersome): -anxiety -headache -loss of appetite -muscle aches -nausea -night sweats This list may not describe all possible side effects. Call your doctor for medical advice about side effects. You may report side effects to FDA at 1-800-FDA-1088. Where should I keep my medicine? This drug is given in a hospital or clinic and will not be stored at home. NOTE: This sheet is a summary. It may not cover all possible information. If you have questions about this medicine, talk to your doctor, pharmacist, or health care provider.    2016, Elsevier/Gold Standard. (2014-08-28 22:30:56)

## 2015-07-04 NOTE — Assessment & Plan Note (Signed)
The patient tolerated treatment poorly with side effects of poorly controlled diabetes and renal failure. Most recently, there was concern about pulmonary infiltrates. I'm not comfortable to give the patient 2 more cycles of R-CHOP chemotherapy. Even though rituximab does not improve overall survival, there is a role for delay in progression free survival if given as a maintenance treatment. In this patient will could not tolerate further chemotherapy, I felt that rituximab would be a good choice for him. I discussed with him the risks, benefits, side effects of rituximab and he agreed to proceed.  I will proceed with treatment today and repeat in 2 months. I plan to repeat imaging study in March 2017. Clinically he has no signs of active disease

## 2015-07-04 NOTE — Assessment & Plan Note (Signed)
He has chronic kidney disease and this is unrelated to the lymphoma.  Continue close monitoring. he will continue current medical management. I recommend close follow-up with primary care doctor for medication adjustment. 

## 2015-07-04 NOTE — Telephone Encounter (Signed)
Appointments made and patient will get a new avs in chemo °

## 2015-07-04 NOTE — Assessment & Plan Note (Signed)
Per patient request, I will order alpha 1 anti-trypsin level. Because of the infiltrate seen on CT chest which was thought to be related to side effects of chemotherapy, he will not receive further treatment with chemotherapy. He is doing well right now without the need for oxygen therapy and will follow with pulmonologist closely in the future.

## 2015-07-08 ENCOUNTER — Encounter: Payer: Self-pay | Admitting: Physician Assistant

## 2015-07-08 ENCOUNTER — Ambulatory Visit (INDEPENDENT_AMBULATORY_CARE_PROVIDER_SITE_OTHER): Payer: Medicare Other | Admitting: Physician Assistant

## 2015-07-08 VITALS — BP 130/70 | HR 76 | Temp 98.6°F | Resp 16 | Ht 70.0 in | Wt 158.0 lb

## 2015-07-08 DIAGNOSIS — N183 Chronic kidney disease, stage 3 (moderate): Secondary | ICD-10-CM | POA: Diagnosis not present

## 2015-07-08 DIAGNOSIS — E785 Hyperlipidemia, unspecified: Secondary | ICD-10-CM | POA: Diagnosis not present

## 2015-07-08 DIAGNOSIS — I129 Hypertensive chronic kidney disease with stage 1 through stage 4 chronic kidney disease, or unspecified chronic kidney disease: Secondary | ICD-10-CM | POA: Diagnosis not present

## 2015-07-08 DIAGNOSIS — E1122 Type 2 diabetes mellitus with diabetic chronic kidney disease: Secondary | ICD-10-CM

## 2015-07-08 LAB — LIPID PANEL
CHOL/HDL RATIO: 3.3 ratio (ref <=5.0)
Cholesterol: 143 mg/dL (ref 125–200)
HDL: 43 mg/dL (ref >=40)
LDL CALC: 79 mg/dL (ref <130)
Triglycerides: 107 mg/dL (ref <150)
VLDL: 21 mg/dL (ref <30)

## 2015-07-08 LAB — BASIC METABOLIC PANEL
BUN: 31 mg/dL — AB (ref 7–25)
CHLORIDE: 103 mmol/L (ref 98–110)
CO2: 29 mmol/L (ref 20–31)
CREATININE: 1.87 mg/dL — AB (ref 0.70–1.25)
Calcium: 9.5 mg/dL (ref 8.6–10.3)
GLUCOSE: 144 mg/dL — AB (ref 65–99)
POTASSIUM: 4.1 mmol/L (ref 3.5–5.3)
Sodium: 138 mmol/L (ref 135–146)

## 2015-07-08 LAB — HEMOGLOBIN A1C
Hgb A1c MFr Bld: 6.5 % — ABNORMAL HIGH (ref <5.7)
MEAN PLASMA GLUCOSE: 140 mg/dL — AB (ref <117)

## 2015-07-08 LAB — ALPHA-1-ANTITRYPSIN: A1 ANTITRYPSIN SER: 116 mg/dL (ref 83–199)

## 2015-07-08 NOTE — Progress Notes (Signed)
[name], male    DOB: Mar 13, 1951, 65 y.o.   MRN: [id number]  PCP: Shawnda Mauney, PA-C  Subjective:   Chief Complaint  Patient presents with  . Follow-up  . 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  Physically, "I feel pretty good." No CP, SOB, HA, dizziness. No GI/GU symptoms.  Wife is dying. Metastatic breast cancer. She stopped eating several weeks ago. She isn't drinking much anymore. Her death is anticipated very soon.  From his 0/16 visit with oncology:  Diffuse large B cell lymphoma The patient tolerated treatment poorly with side effects of poorly controlled diabetes and renal failure. Most recently, there was concern about pulmonary infiltrates. I'm not comfortable to give the patient 2 more cycles of R-CHOP chemotherapy. Even though rituximab does not improve overall survival, there is a role for delay in progression free survival if given as a maintenance treatment. If this patient could not tolerate further chemotherapy, I felt that rituximab would be a good choice for him. I discussed with him the risks, benefits, side effects of rituximab and he agreed to proceed. I will proceed with treatment today and repeat in 2 months. I plan to repeat imaging study in March 2017. Clinically he has no signs of active disease   Review of Systems  Constitutional: Negative for activity change, appetite change, fatigue and unexpected weight change.  HENT: Negative for congestion, dental problem, ear pain, hearing loss, mouth sores, postnasal drip, rhinorrhea, sneezing, sore throat, tinnitus and trouble swallowing.   Eyes: Negative for photophobia, pain, redness and visual disturbance.  Respiratory: Negative for cough, chest tightness and shortness of breath.   Cardiovascular: Negative for chest pain, palpitations and leg swelling.  Gastrointestinal: Negative for nausea, vomiting, abdominal pain,  diarrhea, constipation and blood in stool.  Genitourinary: Negative for dysuria, urgency, frequency and hematuria.  Musculoskeletal: Negative for myalgias, arthralgias, gait problem and neck stiffness.  Skin: Negative for rash.  Neurological: Negative for dizziness, speech difficulty, weakness, light-headedness, numbness and headaches.  Hematological: Negative for adenopathy.  Psychiatric/Behavioral: Negative for confusion and sleep disturbance. The patient is not nervous/anxious.        Patient Active Problem List   Diagnosis Date Noted  . Diffuse large B-cell lymphoma of lymph nodes of multiple regions ([place]) 04/24/2015  . Restrictive lung disease 04/23/2015  . Reactive thrombocytosis 01/09/2015  . Elevated liver enzymes 01/09/2015  . Leukocytosis 12/19/2014  . Thrombocytopenia due to drugs 12/11/2014  . Diffuse large B cell lymphoma ([place]) 11/04/2014  . Anemia in neoplastic disease 11/04/2014  . Hyperlipidemia 05/11/2013  . Hypertensive renal disease   . DM (diabetes mellitus), type 2 with renal complications ([place])   . Chronic kidney disease, stage III (moderate)   . History of tobacco abuse   . Erectile dysfunction      Prior to Admission medications   Medication Sig Start Date End Date Taking? Authorizing Provider  amLODipine (NORVASC) 10 MG tablet TAKE ONE TABLET BY MOUTH ONCE DAILY 03/27/15  Yes Kameron Blethen, PA-C  aspirin 81 MG tablet Take 81 mg by mouth daily.   Yes Historical Provider, MD  fish oil-omega-3 fatty acids 1000 MG capsule Take 1 g by mouth daily.   Yes Historical Provider, MD  glipiZIDE (GLUCOTROL XL) 10 MG 24 hr tablet TAKE ONE TABLET BY MOUTH ONCE DAILY 03/27/15  Yes Kassidee Narciso, PA-C  hydrochlorothiazide (HYDRODIURIL) 25 MG tablet TAKE ONE TABLET BY MOUTH ONCE DAILY  04/03/15  Yes Jahdiel Krol, PA-C  losartan (COZAAR) 100 MG tablet Take 1 tablet (100 mg total) by mouth daily. 04/03/15  Yes [place] Duquette, PA-C  pravastatin (PRAVACHOL) 40 MG tablet TAKE  ONE TABLET BY MOUTH ONCE DAILY 04/03/15  Yes Brighten Orndoff, PA-C  sitaGLIPtin (JANUVIA) 50 MG tablet Take 1 tablet (50 mg total) by mouth daily. 03/27/15  Yes Camila [place]ville, PA-C  glucose blood (ACCU-CHEK AVIVA PLUS) test strip Use as instructed Patient not taking: Reported on 07/08/2015 09/24/14   Harrison Mons, PA-C     Allergies  Allergen Reactions  . Ace Inhibitors Hives  . Rituximab Other (See Comments)    This medication caused him to sweat & shake       Objective:  Physical Exam  Constitutional: He is oriented to person, place, and time. Vital signs are normal. He appears well-developed and well-nourished. He is active and cooperative. No distress.  BP 130/70 mmHg  Pulse 76  Temp(Src) 98.6 F (37 C) (Oral)  Resp 16  Ht '5\' 10"'$  (1.778 m)  Wt 158 lb (71.668 kg)  BMI 22.67 kg/m2  SpO2 97%  HENT:  Head: Normocephalic and atraumatic.  Right Ear: Hearing normal.  Left Ear: Hearing normal.  Eyes: Conjunctivae are normal. No scleral icterus.  Neck: Normal range of motion. Neck supple. No thyromegaly present.  Cardiovascular: Normal rate, regular rhythm and normal heart sounds.   Pulses:      Radial pulses are 2+ on the right side, and 2+ on the left side.  Pulmonary/Chest: Effort normal and breath sounds normal.  Lymphadenopathy:       Head (right side): No tonsillar, no preauricular, no posterior auricular and no occipital adenopathy present.       Head (left side): No tonsillar, no preauricular, no posterior auricular and no occipital adenopathy present.    He has no cervical adenopathy.       Right: No supraclavicular adenopathy present.       Left: No supraclavicular adenopathy present.  Neurological: He is alert and oriented to person, place, and time. No sensory deficit.  Skin: Skin is warm, dry and intact. No rash noted. No cyanosis or erythema. Nails show no clubbing.  Psychiatric: His speech is normal and behavior is normal. His mood appears not anxious. His affect  is not angry, not blunt, not labile and not inappropriate. He exhibits a depressed mood.           Assessment & Plan:   1. Type 2 diabetes mellitus with stage 3 chronic kidney disease, without long-term current use of insulin ([place]) Await lab results. Adjust regimen if indicated. Needs urine microalbumin, but unable to provide specimen today. - Basic metabolic panel - Hemoglobin A1c  2. Hypertensive renal disease, stage 1-4 or unspecified chronic kidney disease ([place]) Await lab results. Continue follow-up with nephrology per their recommendations. - Basic metabolic panel  3. Hyperlipidemia Await results. - Lipid panel   We agree to put off his eye exam, colonoscopy, shingles vaccine, dental visit until after his wife's death. Will revisit these items at his next visit with me in 3 months. Certainly he's to let me know if I can be of assistance in the meantime.   Fara Chute, PA-C Physician Assistant-Certified [facility]

## 2015-07-08 NOTE — Patient Instructions (Signed)
I will contact you with your lab results as soon as they are available.   If you have not heard from me in 2 weeks, please contact me.  The fastest way to get your results is to register for My Chart (see the instructions on the last page of this printout).  Give my love to [place]. I'll be sending you both positive thoughts.

## 2015-08-04 ENCOUNTER — Ambulatory Visit: Payer: Medicare Other | Admitting: Pulmonary Disease

## 2015-09-02 ENCOUNTER — Telehealth: Payer: Self-pay | Admitting: Hematology and Oncology

## 2015-09-02 ENCOUNTER — Ambulatory Visit (HOSPITAL_BASED_OUTPATIENT_CLINIC_OR_DEPARTMENT_OTHER): Payer: Medicare Other

## 2015-09-02 ENCOUNTER — Other Ambulatory Visit (HOSPITAL_BASED_OUTPATIENT_CLINIC_OR_DEPARTMENT_OTHER): Payer: Medicare Other

## 2015-09-02 ENCOUNTER — Ambulatory Visit: Payer: Medicare Other

## 2015-09-02 ENCOUNTER — Encounter: Payer: Self-pay | Admitting: Hematology and Oncology

## 2015-09-02 ENCOUNTER — Ambulatory Visit (HOSPITAL_BASED_OUTPATIENT_CLINIC_OR_DEPARTMENT_OTHER): Payer: Medicare Other | Admitting: Hematology and Oncology

## 2015-09-02 VITALS — BP 119/61 | HR 73 | Temp 98.0°F | Resp 18

## 2015-09-02 VITALS — BP 143/58 | HR 84 | Temp 98.0°F | Resp 18 | Wt 163.1 lb

## 2015-09-02 DIAGNOSIS — N63 Unspecified lump in breast: Secondary | ICD-10-CM | POA: Diagnosis not present

## 2015-09-02 DIAGNOSIS — C8338 Diffuse large B-cell lymphoma, lymph nodes of multiple sites: Secondary | ICD-10-CM

## 2015-09-02 DIAGNOSIS — N183 Chronic kidney disease, stage 3 unspecified: Secondary | ICD-10-CM

## 2015-09-02 DIAGNOSIS — D630 Anemia in neoplastic disease: Secondary | ICD-10-CM | POA: Diagnosis not present

## 2015-09-02 DIAGNOSIS — Z95828 Presence of other vascular implants and grafts: Secondary | ICD-10-CM

## 2015-09-02 DIAGNOSIS — Z5112 Encounter for antineoplastic immunotherapy: Secondary | ICD-10-CM | POA: Diagnosis not present

## 2015-09-02 LAB — CBC WITH DIFFERENTIAL/PLATELET
BASO%: 0.6 % (ref 0.0–2.0)
Basophils Absolute: 0.1 10*3/uL (ref 0.0–0.1)
EOS%: 1 % (ref 0.0–7.0)
Eosinophils Absolute: 0.1 10*3/uL (ref 0.0–0.5)
HEMATOCRIT: 38.5 % (ref 38.4–49.9)
HGB: 12.9 g/dL — ABNORMAL LOW (ref 13.0–17.1)
LYMPH#: 1 10*3/uL (ref 0.9–3.3)
LYMPH%: 10.9 % — ABNORMAL LOW (ref 14.0–49.0)
MCH: 28.7 pg (ref 27.2–33.4)
MCHC: 33.5 g/dL (ref 32.0–36.0)
MCV: 85.7 fL (ref 79.3–98.0)
MONO#: 0.6 10*3/uL (ref 0.1–0.9)
MONO%: 6.6 % (ref 0.0–14.0)
NEUT%: 80.9 % — ABNORMAL HIGH (ref 39.0–75.0)
NEUTROS ABS: 7.5 10*3/uL — AB (ref 1.5–6.5)
PLATELETS: 191 10*3/uL (ref 140–400)
RBC: 4.49 10*6/uL (ref 4.20–5.82)
RDW: 13.7 % (ref 11.0–14.6)
WBC: 9.3 10*3/uL (ref 4.0–10.3)

## 2015-09-02 LAB — COMPREHENSIVE METABOLIC PANEL
ALBUMIN: 3.8 g/dL (ref 3.5–5.0)
ALK PHOS: 78 U/L (ref 40–150)
ALT: 30 U/L (ref 0–55)
AST: 27 U/L (ref 5–34)
Anion Gap: 8 mEq/L (ref 3–11)
BUN: 24.7 mg/dL (ref 7.0–26.0)
CALCIUM: 9 mg/dL (ref 8.4–10.4)
CHLORIDE: 108 meq/L (ref 98–109)
CO2: 24 mEq/L (ref 22–29)
CREATININE: 2 mg/dL — AB (ref 0.7–1.3)
EGFR: 40 mL/min/{1.73_m2} — ABNORMAL LOW (ref >90)
GLUCOSE: 151 mg/dL — AB (ref 70–140)
Potassium: 3.8 mEq/L (ref 3.5–5.1)
Sodium: 140 mEq/L (ref 136–145)
Total Bilirubin: 0.55 mg/dL (ref 0.20–1.20)
Total Protein: 6.6 g/dL (ref 6.4–8.3)

## 2015-09-02 LAB — LACTATE DEHYDROGENASE: LDH: 192 U/L (ref 125–245)

## 2015-09-02 MED ORDER — DIPHENHYDRAMINE HCL 25 MG PO CAPS
ORAL_CAPSULE | ORAL | Status: AC
  Filled 2015-09-02: qty 2

## 2015-09-02 MED ORDER — ACETAMINOPHEN 325 MG PO TABS
ORAL_TABLET | ORAL | Status: AC
  Filled 2015-09-02: qty 2

## 2015-09-02 MED ORDER — SODIUM CHLORIDE 0.9 % IJ SOLN
10.0000 mL | INTRAMUSCULAR | Status: DC | PRN
  Administered 2015-09-02: 10 mL
  Filled 2015-09-02: qty 10

## 2015-09-02 MED ORDER — SODIUM CHLORIDE 0.9 % IV SOLN
Freq: Once | INTRAVENOUS | Status: AC
  Administered 2015-09-02: 11:00:00 via INTRAVENOUS

## 2015-09-02 MED ORDER — SODIUM CHLORIDE 0.9 % IV SOLN
375.0000 mg/m2 | Freq: Once | INTRAVENOUS | Status: AC
  Administered 2015-09-02: 700 mg via INTRAVENOUS
  Filled 2015-09-02: qty 60

## 2015-09-02 MED ORDER — DIPHENHYDRAMINE HCL 25 MG PO CAPS
50.0000 mg | ORAL_CAPSULE | Freq: Once | ORAL | Status: AC
  Administered 2015-09-02: 50 mg via ORAL

## 2015-09-02 MED ORDER — HEPARIN SOD (PORK) LOCK FLUSH 100 UNIT/ML IV SOLN
500.0000 [IU] | Freq: Once | INTRAVENOUS | Status: AC | PRN
  Administered 2015-09-02: 500 [IU]
  Filled 2015-09-02: qty 5

## 2015-09-02 MED ORDER — ACETAMINOPHEN 325 MG PO TABS
650.0000 mg | ORAL_TABLET | Freq: Once | ORAL | Status: AC
Start: 2015-09-02 — End: 2015-09-02
  Administered 2015-09-02: 650 mg via ORAL

## 2015-09-02 MED ORDER — SODIUM CHLORIDE 0.9% FLUSH
10.0000 mL | INTRAVENOUS | Status: DC | PRN
  Administered 2015-09-02: 10 mL via INTRAVENOUS
  Filled 2015-09-02: qty 10

## 2015-09-02 NOTE — Progress Notes (Signed)
[place] OFFICE PROGRESS NOTE  Patient Care Team: Harrison Mons, PA-C as PCP - General (Physician Assistant) Corliss Parish, MD as Consulting Physician (Nephrology) Fanny Skates, MD as Consulting Physician (General Surgery) Heath Lark, MD as Consulting Physician (Hematology and Oncology)  SUMMARY OF ONCOLOGIC HISTORY: Oncology History   Lymphoma   Staging form: Lymphoid Neoplasms, AJCC 6th Edition     Clinical stage from 11/21/2014: Stage IV - Signed by Heath Lark, MD on 11/21/2014       Diffuse large B cell lymphoma ([place])   11/13/2014 Imaging PEt scan showed widespread disease including possible splenic involvement   11/19/2014 Surgery He has excisional LN biopsy and port placement   11/19/2014 Pathology Results Biopsy of lymph nodes showed DLBCL   11/29/2014 - 01/31/2015 Chemotherapy He received 4 cycles of RCHOP chemotherapy complicated by severe infusion reaction.   12/19/2014 - 01/09/2015 Chemotherapy He received 2 cycles of prophylactic IT chemotherapy.   01/31/2015 Adverse Reaction I reduce the dose of his chemotherapy, prednisone and dexamethasone due to poorly controlled diabetes and hyperglycemia   02/12/2015 Imaging  repeat PET CT scan show complete response to chemotherapy but new lung infiltrates    INTERVAL HISTORY: Please see below for problem oriented charting. He returns for further follow-up and prior to rituximab treatment. He self palpated a lump behind the right nipple there is sore to touch and had been present for one month. He denies new lymphadenopathy. Denies recent infection.  REVIEW OF SYSTEMS:   Constitutional: Denies fevers, chills or abnormal weight loss Eyes: Denies blurriness of vision Ears, nose, mouth, throat, and face: Denies mucositis or sore throat Respiratory: Denies cough, dyspnea or wheezes Cardiovascular: Denies palpitation, chest discomfort or lower extremity swelling Gastrointestinal:  Denies nausea, heartburn or change  in bowel habits Skin: Denies abnormal skin rashes Lymphatics: Denies new lymphadenopathy or easy bruising Neurological:Denies numbness, tingling or new weaknesses Behavioral/Psych: Mood is stable, no new changes  All other systems were reviewed with the patient and are negative.  I have reviewed the past medical history, past surgical history, social history and family history with the patient and they are unchanged from previous note.  ALLERGIES:  is allergic to ace inhibitors and rituximab.  MEDICATIONS:  Current Outpatient Prescriptions  Medication Sig Dispense Refill  . amLODipine (NORVASC) 10 MG tablet TAKE ONE TABLET BY MOUTH ONCE DAILY 90 tablet 3  . aspirin 81 MG tablet Take 81 mg by mouth daily.    . fish oil-omega-3 fatty acids 1000 MG capsule Take 1 g by mouth daily.    Marland Kitchen glipiZIDE (GLUCOTROL XL) 10 MG 24 hr tablet TAKE ONE TABLET BY MOUTH ONCE DAILY 90 tablet 3  . glucose blood (ACCU-CHEK AVIVA PLUS) test strip Use as instructed 100 each 4  . hydrochlorothiazide (HYDRODIURIL) 25 MG tablet TAKE ONE TABLET BY MOUTH ONCE DAILY 30 tablet 5  . losartan (COZAAR) 100 MG tablet Take 1 tablet (100 mg total) by mouth daily. 30 tablet 5  . pravastatin (PRAVACHOL) 40 MG tablet TAKE ONE TABLET BY MOUTH ONCE DAILY 30 tablet 5  . sitaGLIPtin (JANUVIA) 50 MG tablet Take 1 tablet (50 mg total) by mouth daily. 90 tablet 3   No current facility-administered medications for this visit.    PHYSICAL EXAMINATION: ECOG PERFORMANCE STATUS: 1 - Symptomatic but completely ambulatory  Filed Vitals:   09/02/15 1001  BP: 143/58  Pulse: 84  Temp: 98 F (36.7 C)  Resp: 18   Filed Weights   09/02/15 1001  Weight: 163 lb 1.6 oz (73.982 kg)    GENERAL:alert, no distress and comfortable SKIN: skin color, texture, turgor are normal, no rashes or significant lesions EYES: normal, Conjunctiva are pink and non-injected, sclera clear OROPHARYNX:no exudate, no erythema and lips, buccal mucosa, and  tongue normal  NECK: supple, thyroid normal size, non-tender, without nodularity LYMPH:  no palpable lymphadenopathy in the cervical, axillary or inguinal LUNGS: clear to auscultation and percussion with normal breathing effort HEART: regular rate & rhythm and no murmurs and no lower extremity edema ABDOMEN:abdomen soft, non-tender and normal bowel sounds. There is a palpable nodule behind the right nipple Musculoskeletal:no cyanosis of digits and no clubbing  NEURO: alert & oriented x 3 with fluent speech, no focal motor/sensory deficits  LABORATORY DATA:  I have reviewed the data as listed    Component Value Date/Time   NA 138 07/08/2015 0948   NA 139 07/04/2015 0901   NA 139 07/17/2014   K 4.1 07/08/2015 0948   K 3.9 07/04/2015 0901   CL 103 07/08/2015 0948   CO2 29 07/08/2015 0948   CO2 26 07/04/2015 0901   GLUCOSE 144* 07/08/2015 0948   GLUCOSE 143* 07/04/2015 0901   BUN 31* 07/08/2015 0948   BUN 33.0* 07/04/2015 0901   BUN 26* 07/17/2014   CREATININE 1.87* 07/08/2015 0948   CREATININE 2.0* 07/04/2015 0901   CREATININE 2.60* 11/23/2014 0314   CREATININE 2.1* 07/17/2014   CALCIUM 9.5 07/08/2015 0948   CALCIUM 9.1 07/04/2015 0901   PROT 6.7 07/04/2015 0901   PROT 6.3 04/03/2015 0824   ALBUMIN 3.9 07/04/2015 0901   ALBUMIN 4.2 04/03/2015 0824   AST 30 07/04/2015 0901   AST 21 04/03/2015 0824   ALT 31 07/04/2015 0901   ALT 18 04/03/2015 0824   ALKPHOS 80 07/04/2015 0901   ALKPHOS 75 04/03/2015 0824   BILITOT 0.56 07/04/2015 0901   BILITOT 0.5 04/03/2015 0824   GFRNONAA 26* 11/22/2014 2357   GFRNONAA 33* 09/11/2013 0920   GFRAA 30* 11/22/2014 2357   GFRAA 38* 09/11/2013 0920    No results found for: SPEP, UPEP  Lab Results  Component Value Date   WBC 9.3 09/02/2015   NEUTROABS 7.5* 09/02/2015   HGB 12.9* 09/02/2015   HCT 38.5 09/02/2015   MCV 85.7 09/02/2015   PLT 191 09/02/2015      Chemistry      Component Value Date/Time   NA 138 07/08/2015 0948    NA 139 07/04/2015 0901   NA 139 07/17/2014   K 4.1 07/08/2015 0948   K 3.9 07/04/2015 0901   CL 103 07/08/2015 0948   CO2 29 07/08/2015 0948   CO2 26 07/04/2015 0901   BUN 31* 07/08/2015 0948   BUN 33.0* 07/04/2015 0901   BUN 26* 07/17/2014   CREATININE 1.87* 07/08/2015 0948   CREATININE 2.0* 07/04/2015 0901   CREATININE 2.60* 11/23/2014 0314   CREATININE 2.1* 07/17/2014   GLU 382* 01/31/2015 1551      Component Value Date/Time   CALCIUM 9.5 07/08/2015 0948   CALCIUM 9.1 07/04/2015 0901   ALKPHOS 80 07/04/2015 0901   ALKPHOS 75 04/03/2015 0824   AST 30 07/04/2015 0901   AST 21 04/03/2015 0824   ALT 31 07/04/2015 0901   ALT 18 04/03/2015 0824   BILITOT 0.56 07/04/2015 0901   BILITOT 0.5 04/03/2015 0824      ASSESSMENT & PLAN:  Diffuse large B-cell lymphoma of lymph nodes of multiple regions [facility]) The patient self palpated a lump  behind the right nipple which is tender, been present for one month. Examination revealed a soft subcutaneous nodule. I recommend restaging with PET/CT scan. I will proceed with rituximab as scheduled today  Anemia in neoplastic disease This is likely due to recent treatment. The patient denies recent history of bleeding such as epistaxis, hematuria or hematochezia. He is asymptomatic from the anemia. I will observe for now.  He does not require transfusion now.    Chronic kidney disease, stage III (moderate) He has chronic kidney disease and this is unrelated to the lymphoma.  Continue close monitoring. he will continue current medical management. I recommend close follow-up with primary care doctor for medication adjustment.    Orders Placed This Encounter  Procedures  . NM PET Image Restag (PS) Skull Base To Thigh    Standing Status: Future     Number of Occurrences:      Standing Expiration Date: 11/01/2016    Order Specific Question:  Reason for Exam (SYMPTOM  OR DIAGNOSIS REQUIRED)    Answer:  lymphoma, assess response to Rx     Order Specific Question:  Preferred imaging location?    Answer:  [facility]   All questions were answered. The patient knows to call the clinic with any problems, questions or concerns. No barriers to learning was detected. I spent 20 minutes counseling the patient face to face. The total time spent in the appointment was 30 minutes and more than 50% was on counseling and review of test results     [facility], [place], MD 09/02/2015 10:42 AM

## 2015-09-02 NOTE — Patient Instructions (Signed)

## 2015-09-02 NOTE — Telephone Encounter (Signed)
per po fto sch pt appt-gave pt copy of avs-adv central sch will call to sch scan

## 2015-09-02 NOTE — Assessment & Plan Note (Signed)
He has chronic kidney disease and this is unrelated to the lymphoma.  Continue close monitoring. he will continue current medical management. I recommend close follow-up with primary care doctor for medication adjustment. 

## 2015-09-02 NOTE — Patient Instructions (Signed)
[facility] Discharge Instructions for Patients Receiving Chemotherapy  Today you received the following chemotherapy agents: Rituxan   To help prevent nausea and vomiting after your treatment, we encourage you to take your nausea medication as directed.    If you develop nausea and vomiting that is not controlled by your nausea medication, call the clinic.   BELOW ARE SYMPTOMS THAT SHOULD BE REPORTED IMMEDIATELY:  *FEVER GREATER THAN 100.5 F  *CHILLS WITH OR WITHOUT FEVER  NAUSEA AND VOMITING THAT IS NOT CONTROLLED WITH YOUR NAUSEA MEDICATION  *UNUSUAL SHORTNESS OF BREATH  *UNUSUAL BRUISING OR BLEEDING  TENDERNESS IN MOUTH AND THROAT WITH OR WITHOUT PRESENCE OF ULCERS  *URINARY PROBLEMS  *BOWEL PROBLEMS  UNUSUAL RASH Items with * indicate a potential emergency and should be followed up as soon as possible.  Feel free to call the clinic you have any questions or concerns. The clinic phone number is [phone].  Please show the CHEMO ALERT CARD at check-in to the Emergency Department and triage nurse.   

## 2015-09-02 NOTE — Telephone Encounter (Signed)
per pof to sch pt ppt-gave pt copy of avs

## 2015-09-02 NOTE — Assessment & Plan Note (Signed)
This is likely due to recent treatment. The patient denies recent history of bleeding such as epistaxis, hematuria or hematochezia. He is asymptomatic from the anemia. I will observe for now.  He does not require transfusion now. 

## 2015-09-02 NOTE — Assessment & Plan Note (Signed)
The patient self palpated a lump behind the right nipple which is tender, been present for one month. Examination revealed a soft subcutaneous nodule. I recommend restaging with PET/CT scan. I will proceed with rituximab as scheduled today

## 2015-09-09 ENCOUNTER — Ambulatory Visit (HOSPITAL_COMMUNITY)
Admission: RE | Admit: 2015-09-09 | Discharge: 2015-09-09 | Disposition: A | Discharge status: Home / Self Care | Payer: Medicare Other | Source: Ambulatory Visit | Attending: Hematology and Oncology | Admitting: Hematology and Oncology

## 2015-09-09 DIAGNOSIS — C8338 Diffuse large B-cell lymphoma, lymph nodes of multiple sites: Secondary | ICD-10-CM | POA: Insufficient documentation

## 2015-09-09 DIAGNOSIS — R911 Solitary pulmonary nodule: Secondary | ICD-10-CM | POA: Insufficient documentation

## 2015-09-09 LAB — GLUCOSE, CAPILLARY: GLUCOSE-CAPILLARY: 92 mg/dL (ref 65–99)

## 2015-09-09 MED ORDER — FLUDEOXYGLUCOSE F - 18 (FDG) INJECTION
7.9700 | Freq: Once | INTRAVENOUS | Status: AC | PRN
  Administered 2015-09-09: 7.97 via INTRAVENOUS

## 2015-09-11 ENCOUNTER — Ambulatory Visit (HOSPITAL_BASED_OUTPATIENT_CLINIC_OR_DEPARTMENT_OTHER): Payer: Medicare Other | Admitting: Hematology and Oncology

## 2015-09-11 ENCOUNTER — Encounter: Payer: Self-pay | Admitting: Hematology and Oncology

## 2015-09-11 ENCOUNTER — Telehealth: Payer: Self-pay | Admitting: Hematology and Oncology

## 2015-09-11 VITALS — BP 150/58 | HR 83 | Temp 98.0°F | Resp 17 | Ht 70.0 in | Wt 161.7 lb

## 2015-09-11 DIAGNOSIS — R911 Solitary pulmonary nodule: Secondary | ICD-10-CM | POA: Diagnosis not present

## 2015-09-11 DIAGNOSIS — C8338 Diffuse large B-cell lymphoma, lymph nodes of multiple sites: Secondary | ICD-10-CM | POA: Diagnosis not present

## 2015-09-11 NOTE — Assessment & Plan Note (Signed)
The patient self palpated a lump behind the right nipple which is tender, been present for one month. Examination revealed a soft subcutaneous nodule. PET/CT scan showed complete response to treatment.  The subcutaneous nodular around the nipple does not correspond to any abnormalities. I recommend observation only. [name] [name] will resume rituximab in April and I will see him back in June

## 2015-09-11 NOTE — Progress Notes (Signed)
[place] OFFICE PROGRESS NOTE  Patient Care Team: Harrison Mons, PA-C as PCP - General (Physician Assistant) Corliss Parish, MD as Consulting Physician (Nephrology) Fanny Skates, MD as Consulting Physician (General Surgery) Heath Lark, MD as Consulting Physician (Hematology and Oncology)  SUMMARY OF ONCOLOGIC HISTORY: Oncology History   Lymphoma   Staging form: Lymphoid Neoplasms, AJCC 6th Edition     Clinical stage from 11/21/2014: Stage IV - Signed by Heath Lark, MD on 11/21/2014       Diffuse large B-cell lymphoma of lymph nodes of multiple regions ([place])   11/13/2014 Imaging PEt scan showed widespread disease including possible splenic involvement   11/19/2014 Surgery He has excisional LN biopsy and port placement   11/19/2014 Pathology Results Biopsy of lymph nodes showed DLBCL   11/29/2014 - 01/31/2015 Chemotherapy He received 4 cycles of RCHOP chemotherapy complicated by severe infusion reaction.   12/19/2014 - 01/09/2015 Chemotherapy He received 2 cycles of prophylactic IT chemotherapy.   01/31/2015 Adverse Reaction I reduce the dose of his chemotherapy, prednisone and dexamethasone due to poorly controlled diabetes and hyperglycemia   02/12/2015 Imaging  repeat PET CT scan show complete response to chemotherapy but new lung infiltrates   09/09/2015 Imaging PET CT showed complete response except for new inflammatory change in the lung    INTERVAL HISTORY: Please see below for problem oriented charting. He returns for further follow-up, to review imaging study. He denies recent chest pain or shortness of breath. No recent infection. The subcutaneous nodule behind the right nipple feels about the same, comes and goes  REVIEW OF SYSTEMS:   Constitutional: Denies fevers, chills or abnormal weight loss Eyes: Denies blurriness of vision Ears, nose, mouth, throat, and face: Denies mucositis or sore throat Respiratory: Denies cough, dyspnea or  wheezes Cardiovascular: Denies palpitation, chest discomfort or lower extremity swelling Gastrointestinal:  Denies nausea, heartburn or change in bowel habits Skin: Denies abnormal skin rashes Lymphatics: Denies new lymphadenopathy or easy bruising Neurological:Denies numbness, tingling or new weaknesses Behavioral/Psych: Mood is stable, no new changes  All other systems were reviewed with the patient and are negative.  I have reviewed the past medical history, past surgical history, social history and family history with the patient and they are unchanged from previous note.  ALLERGIES:  is allergic to ace inhibitors and rituximab.  MEDICATIONS:  Current Outpatient Prescriptions  Medication Sig Dispense Refill  . amLODipine (NORVASC) 10 MG tablet TAKE ONE TABLET BY MOUTH ONCE DAILY 90 tablet 3  . aspirin 81 MG tablet Take 81 mg by mouth daily.    . fish oil-omega-3 fatty acids 1000 MG capsule Take 1 g by mouth daily.    Marland Kitchen glipiZIDE (GLUCOTROL XL) 10 MG 24 hr tablet TAKE ONE TABLET BY MOUTH ONCE DAILY 90 tablet 3  . glucose blood (ACCU-CHEK AVIVA PLUS) test strip Use as instructed 100 each 4  . hydrochlorothiazide (HYDRODIURIL) 25 MG tablet TAKE ONE TABLET BY MOUTH ONCE DAILY 30 tablet 5  . losartan (COZAAR) 100 MG tablet Take 1 tablet (100 mg total) by mouth daily. 30 tablet 5  . pravastatin (PRAVACHOL) 40 MG tablet TAKE ONE TABLET BY MOUTH ONCE DAILY 30 tablet 5  . sitaGLIPtin (JANUVIA) 50 MG tablet Take 1 tablet (50 mg total) by mouth daily. 90 tablet 3   No current facility-administered medications for this visit.    PHYSICAL EXAMINATION: ECOG PERFORMANCE STATUS: 0 - Asymptomatic  Filed Vitals:   09/11/15 1047  BP: 150/58  Pulse: 83  Temp:  98 F (36.7 C)  Resp: 17   Filed Weights   09/11/15 1047  Weight: 161 lb 11.2 oz (73.347 kg)    GENERAL:alert, no distress and comfortable SKIN: skin color, texture, turgor are normal, no rashes or significant lesions EYES:  normal, Conjunctiva are pink and non-injected, sclera clear Musculoskeletal:no cyanosis of digits and no clubbing  NEURO: alert & oriented x 3 with fluent speech, no focal motor/sensory deficits  LABORATORY DATA:  I have reviewed the data as listed    Component Value Date/Time   NA 140 09/02/2015 0932   NA 138 07/08/2015 0948   NA 139 07/17/2014   K 3.8 09/02/2015 0932   K 4.1 07/08/2015 0948   CL 103 07/08/2015 0948   CO2 24 09/02/2015 0932   CO2 29 07/08/2015 0948   GLUCOSE 151* 09/02/2015 0932   GLUCOSE 144* 07/08/2015 0948   BUN 24.7 09/02/2015 0932   BUN 31* 07/08/2015 0948   BUN 26* 07/17/2014   CREATININE 2.0* 09/02/2015 0932   CREATININE 1.87* 07/08/2015 0948   CREATININE 2.60* 11/23/2014 0314   CREATININE 2.1* 07/17/2014   CALCIUM 9.0 09/02/2015 0932   CALCIUM 9.5 07/08/2015 0948   PROT 6.6 09/02/2015 0932   PROT 6.3 04/03/2015 0824   ALBUMIN 3.8 09/02/2015 0932   ALBUMIN 4.2 04/03/2015 0824   AST 27 09/02/2015 0932   AST 21 04/03/2015 0824   ALT 30 09/02/2015 0932   ALT 18 04/03/2015 0824   ALKPHOS 78 09/02/2015 0932   ALKPHOS 75 04/03/2015 0824   BILITOT 0.55 09/02/2015 0932   BILITOT 0.5 04/03/2015 0824   GFRNONAA 26* 11/22/2014 2357   GFRNONAA 33* 09/11/2013 0920   GFRAA 30* 11/22/2014 2357   GFRAA 38* 09/11/2013 0920    No results found for: SPEP, UPEP  Lab Results  Component Value Date   WBC 9.3 09/02/2015   NEUTROABS 7.5* 09/02/2015   HGB 12.9* 09/02/2015   HCT 38.5 09/02/2015   MCV 85.7 09/02/2015   PLT 191 09/02/2015      Chemistry      Component Value Date/Time   NA 140 09/02/2015 0932   NA 138 07/08/2015 0948   NA 139 07/17/2014   K 3.8 09/02/2015 0932   K 4.1 07/08/2015 0948   CL 103 07/08/2015 0948   CO2 24 09/02/2015 0932   CO2 29 07/08/2015 0948   BUN 24.7 09/02/2015 0932   BUN 31* 07/08/2015 0948   BUN 26* 07/17/2014   CREATININE 2.0* 09/02/2015 0932   CREATININE 1.87* 07/08/2015 0948   CREATININE 2.60* 11/23/2014 0314    CREATININE 2.1* 07/17/2014   GLU 382* 01/31/2015 1551      Component Value Date/Time   CALCIUM 9.0 09/02/2015 0932   CALCIUM 9.5 07/08/2015 0948   ALKPHOS 78 09/02/2015 0932   ALKPHOS 75 04/03/2015 0824   AST 27 09/02/2015 0932   AST 21 04/03/2015 0824   ALT 30 09/02/2015 0932   ALT 18 04/03/2015 0824   BILITOT 0.55 09/02/2015 0932   BILITOT 0.5 04/03/2015 0824       RADIOGRAPHIC STUDIES:I reviewed the PET CT scan with the patient, comparison with old imaging I have personally reviewed the radiological images as listed and agreed with the findings in the report.    ASSESSMENT & PLAN:  Diffuse large B-cell lymphoma of lymph nodes of multiple regions [facility]) The patient self palpated a lump behind the right nipple which is tender, been present for one month. Examination revealed a soft subcutaneous nodule. PET/CT  scan showed complete response to treatment.  The subcutaneous nodular around the nipple does not correspond to any abnormalities. I recommend observation only. He will resume rituximab in April and I will see him back in June  Nodule of left lung This is discovered incidentally. He is not symptomatic. I recommend observation only and repeat CT scan of the chest in 6 months, due in September 2017.   No orders of the defined types were placed in this encounter.   All questions were answered. The patient knows to call the clinic with any problems, questions or concerns. No barriers to learning was detected. I spent 15 minutes counseling the patient face to face. The total time spent in the appointment was 20 minutes and more than 50% was on counseling and review of test results     [facility], [place], MD 09/11/2015 11:55 AM

## 2015-09-11 NOTE — Assessment & Plan Note (Signed)
This is discovered incidentally. He is not symptomatic. I recommend observation only and repeat CT scan of the chest in 6 months, due in September 2017.

## 2015-09-11 NOTE — Telephone Encounter (Signed)
Gave and printed appt sch3ed and avs for pt for April and June

## 2015-10-21 ENCOUNTER — Encounter: Payer: Self-pay | Admitting: Physician Assistant

## 2015-10-21 ENCOUNTER — Ambulatory Visit (INDEPENDENT_AMBULATORY_CARE_PROVIDER_SITE_OTHER): Payer: Medicare Other | Admitting: Physician Assistant

## 2015-10-21 VITALS — BP 144/70 | HR 75 | Temp 97.8°F | Resp 16 | Ht 70.0 in | Wt 163.0 lb

## 2015-10-21 DIAGNOSIS — N183 Chronic kidney disease, stage 3 unspecified: Secondary | ICD-10-CM

## 2015-10-21 DIAGNOSIS — E785 Hyperlipidemia, unspecified: Secondary | ICD-10-CM | POA: Diagnosis not present

## 2015-10-21 DIAGNOSIS — I129 Hypertensive chronic kidney disease with stage 1 through stage 4 chronic kidney disease, or unspecified chronic kidney disease: Secondary | ICD-10-CM

## 2015-10-21 DIAGNOSIS — Z1211 Encounter for screening for malignant neoplasm of colon: Secondary | ICD-10-CM

## 2015-10-21 DIAGNOSIS — E1122 Type 2 diabetes mellitus with diabetic chronic kidney disease: Secondary | ICD-10-CM | POA: Diagnosis not present

## 2015-10-21 LAB — LIPID PANEL
CHOLESTEROL: 133 mg/dL (ref 125–200)
HDL: 38 mg/dL — ABNORMAL LOW (ref >=40)
LDL Cholesterol: 71 mg/dL (ref <130)
TRIGLYCERIDES: 121 mg/dL (ref <150)
Total CHOL/HDL Ratio: 3.5 Ratio (ref <=5.0)
VLDL: 24 mg/dL (ref <30)

## 2015-10-21 LAB — COMPREHENSIVE METABOLIC PANEL
ALBUMIN: 4.4 g/dL (ref 3.6–5.1)
ALK PHOS: 72 U/L (ref 40–115)
ALT: 26 U/L (ref 9–46)
AST: 23 U/L (ref 10–35)
BILIRUBIN TOTAL: 0.6 mg/dL (ref 0.2–1.2)
BUN: 28 mg/dL — ABNORMAL HIGH (ref 7–25)
CALCIUM: 9.2 mg/dL (ref 8.6–10.3)
CO2: 29 mmol/L (ref 20–31)
Chloride: 103 mmol/L (ref 98–110)
Creat: 1.89 mg/dL — ABNORMAL HIGH (ref 0.70–1.25)
Glucose, Bld: 132 mg/dL — ABNORMAL HIGH (ref 65–99)
POTASSIUM: 4.1 mmol/L (ref 3.5–5.3)
Sodium: 141 mmol/L (ref 135–146)
Total Protein: 6.6 g/dL (ref 6.1–8.1)

## 2015-10-21 LAB — MICROALBUMIN, URINE: Microalb, Ur: 5.7 mg/dL

## 2015-10-21 LAB — HEMOGLOBIN A1C
Hgb A1c MFr Bld: 6.5 % — ABNORMAL HIGH (ref <5.7)
MEAN PLASMA GLUCOSE: 140 mg/dL

## 2015-10-21 MED ORDER — LOSARTAN POTASSIUM 100 MG PO TABS: 100.0000 mg | ORAL_TABLET | Freq: Every day | ORAL | Status: DC

## 2015-10-21 MED ORDER — PRAVASTATIN SODIUM 40 MG PO TABS: ORAL_TABLET | ORAL | Status: DC

## 2015-10-21 MED ORDER — HYDROCHLOROTHIAZIDE 25 MG PO TABS: ORAL_TABLET | ORAL | Status: DC

## 2015-10-21 NOTE — Progress Notes (Signed)
[name], male    DOB: 06-22-1951, 65 y.o.   MRN: [id number]  PCP: [name] Glahn, PA-C  Subjective:   Chief Complaint  Patient presents with  . Follow-up  . Diabetes    HPI Presents for evaluation of diabetes.  Since his last visit with me in 2022-08-06, his wife died of metastatic breast cancer. He reports that he's doing well, keeping busy, generally staying happy. [place] they used to do together.  Patient has no complaints. He denies any fever, chills, vision changes, HA, CP, SOB, N/V/D, change in bowel habits, or GU symptoms.   Continues to diet and exercise. He likes to get up early in the AM and walk. He performs a diabetic foot exam weekly and does not report any new sores.  Last oncology visit 3/9 with Dr. Alvy [name]: Diffuse large B-cell lymphoma of lymph nodes of multiple regions [facility]) The patient self palpated a lump behind the right nipple which is tender, been present for one month. Examination revealed a soft subcutaneous nodule. PET/CT scan showed complete response to treatment.  The subcutaneous nodular around the nipple does not correspond to any abnormalities. I recommend observation only. He will resume rituximab in April and I will see him back in June Nodule of left lung This is discovered incidentally. He is not symptomatic. I recommend observation only and repeat CT scan of the chest in 6 months, due in September 2017.   Review of Systems Constitutional: Negative for fever, chills, activity change, appetite change and fatigue.  HENT: Negative.  Eyes: Negative.  Respiratory: Negative for cough and shortness of breath.  Cardiovascular: Negative for chest pain and leg swelling.  Gastrointestinal: Negative for nausea, vomiting, abdominal pain, diarrhea and constipation.  Genitourinary: Negative for dysuria, urgency, frequency and hematuria.  Neurological: Negative for dizziness,  syncope, weakness, light-headedness, numbness and headaches.     Patient Active Problem List   Diagnosis Date Noted  . Nodule of left lung 09/11/2015  . Restrictive lung disease 04/23/2015  . Reactive thrombocytosis 01/09/2015  . Elevated liver enzymes 01/09/2015  . Leukocytosis 12/19/2014  . Thrombocytopenia due to drugs 12/11/2014  . Diffuse large B-cell lymphoma of lymph nodes of multiple regions ([place]) 11/04/2014  . Anemia in neoplastic disease 11/04/2014  . Hyperlipidemia 05/11/2013  . Hypertensive renal disease   . DM (diabetes mellitus), type 2 with renal complications ([place])   . Chronic kidney disease, stage III (moderate)   . History of tobacco abuse   . Erectile dysfunction      Prior to Admission medications   Medication Sig Start Date End Date Taking? Authorizing Provider  amLODipine (NORVASC) 10 MG tablet TAKE ONE TABLET BY MOUTH ONCE DAILY 03/27/15  Yes [name] Godby, PA-C  aspirin 81 MG tablet Take 81 mg by mouth daily.   Yes Historical Provider, MD  fish oil-omega-3 fatty acids 1000 MG capsule Take 1 g by mouth daily.   Yes Historical Provider, MD  glipiZIDE (GLUCOTROL XL) 10 MG 24 hr tablet TAKE ONE TABLET BY MOUTH ONCE DAILY 03/27/15  Yes [name] Tafolla, PA-C  glucose blood (ACCU-CHEK AVIVA PLUS) test strip Use as instructed 09/24/14  Yes [name] Aman, PA-C  hydrochlorothiazide (HYDRODIURIL) 25 MG tablet TAKE ONE TABLET BY MOUTH ONCE DAILY 04/03/15  Yes [name] Czaplicki, PA-C  losartan (COZAAR) 100 MG tablet Take 1 tablet (100 mg total) by mouth daily. 04/03/15  Yes [name] Kofoed, PA-C  pravastatin (PRAVACHOL) 40  MG tablet TAKE ONE TABLET BY MOUTH ONCE DAILY 04/03/15  Yes [name] Brahm, PA-C  sitaGLIPtin (JANUVIA) 50 MG tablet Take 1 tablet (50 mg total) by mouth daily. 03/27/15  Yes [name] Mons, PA-C     Allergies  Allergen Reactions  . Ace Inhibitors Hives  . Rituximab Other (See Comments)    This medication caused him to sweat & shake       Objective:    Physical Exam  Constitutional: He is oriented to person, place, and time. He appears well-developed and well-nourished. He is active and cooperative. No distress.  BP 144/70 mmHg  Pulse 75  Temp(Src) 97.8 F (36.6 C)  Resp 16  Ht '5\' 10"'$  (1.778 m)  Wt 163 lb (73.936 kg)  BMI 23.39 kg/m2  HENT:  Head: Normocephalic and atraumatic.  Right Ear: Hearing normal.  Left Ear: Hearing normal.  Eyes: Conjunctivae are normal. No scleral icterus.  Neck: Normal range of motion, full passive range of motion without pain and phonation normal. Neck supple. No thyromegaly present.  Cardiovascular: Normal rate, regular rhythm and normal heart sounds.   Pulses:      Radial pulses are 2+ on the right side, and 2+ on the left side.  Pulmonary/Chest: Effort normal and breath sounds normal.  Lymphadenopathy:       Head (right side): No tonsillar, no preauricular, no posterior auricular and no occipital adenopathy present.       Head (left side): No tonsillar, no preauricular, no posterior auricular and no occipital adenopathy present.    He has no cervical adenopathy.       Right: No supraclavicular adenopathy present.       Left: No supraclavicular adenopathy present.  Neurological: He is alert and oriented to person, place, and time. No sensory deficit.  Skin: Skin is warm, dry and intact. No rash noted. No cyanosis or erythema. Nails show no clubbing.  Psychiatric: He has a normal mood and affect. His speech is normal and behavior is normal. Judgment and thought content normal. Cognition and memory are normal.           Assessment & Plan:   1. Type 2 diabetes mellitus with chronic kidney disease, without long-term current use of insulin, unspecified CKD stage ([place]) Has regained control since completing chemotherapy and the oral steroids as part of that. Continue healthy eating and regular exercise. Again encouraged dental and eye exams. - Comprehensive metabolic panel - Hemoglobin A1c -  Microalbumin, urine  2. Chronic kidney disease, stage III (moderate) Continue control of HTN and DM. - Comprehensive metabolic panel  3. Hyperlipidemia Await lab results. - Lipid panel - pravastatin (PRAVACHOL) 40 MG tablet; TAKE ONE TABLET BY MOUTH ONCE DAILY  Dispense: 90 tablet; Refill: 3  4. Screening for colon cancer He agrees to screening colonoscopy now. - Ambulatory referral to Gastroenterology  5. Hypertensive renal disease, stage 1-4 or unspecified chronic kidney disease ([place]) Continue HTN control and renal function monitoring. - losartan (COZAAR) 100 MG tablet; Take 1 tablet (100 mg total) by mouth daily.  Dispense: 90 tablet; Refill: 3 - hydrochlorothiazide (HYDRODIURIL) 25 MG tablet; TAKE ONE TABLET BY MOUTH ONCE DAILY  Dispense: 90 tablet; Refill: 3   Fara Chute, PA-C Physician Assistant-Certified [facility]

## 2015-10-21 NOTE — Patient Instructions (Addendum)
Will call with appointment for colonoscopy.  Please schedule appointment to see the dentist and eye doctor.  Shingles vaccine is recommended.    IF you received an x-ray today, you will receive an invoice from [facility] Radiology. Please contact [facility] Radiology at [phone] with questions or concerns regarding your invoice.   IF you received labwork today, you will receive an invoice from [employer]. Please contact [facility] at [phone] with questions or concerns regarding your invoice.   Our billing staff will not be able to assist you with questions regarding bills from these companies.  You will be contacted with the lab results as soon as they are available. The fastest way to get your results is to activate your My Chart account. Instructions are located on the last page of this paperwork. If you have not heard from [place] regarding the results in 2 weeks, please contact this office.

## 2015-10-21 NOTE — Progress Notes (Signed)
Subjective:    [name], male    DOB: 05-03-1951, 65 y.o.   MRN: [id number]  Chief Complaint  Patient presents with  . Follow-up  . Diabetes    HPI  [name] presents to the clinic today for diabetes follow up.  Patient has not complaints. He denies any fever, chills, vision changes, HA, CP, SOB, N/V/D, change in bowel habits, or GU symptoms.   Continues to diet and exercise. He likes to get up early in the AM and walk. He performs a diabetic foot exam weekly and does not report any new sores.  Last oncology visit 3/9: Diffuse large B-cell lymphoma of lymph nodes of multiple regions [facility]) The patient self palpated a lump behind the right nipple which is tender, been present for one month. Examination revealed a soft subcutaneous nodule. PET/CT scan showed complete response to treatment.  The subcutaneous nodular around the nipple does not correspond to any abnormalities. I recommend observation only. He will resume rituximab in April and I will see him back in June Nodule of left lung This is discovered incidentally. He is not symptomatic. I recommend observation only and repeat CT scan of the chest in 6 months, due in September 2017.  Current Outpatient Prescriptions on File Prior to Visit  Medication Sig Dispense Refill  . amLODipine (NORVASC) 10 MG tablet TAKE ONE TABLET BY MOUTH ONCE DAILY 90 tablet 3  . aspirin 81 MG tablet Take 81 mg by mouth daily.    . fish oil-omega-3 fatty acids 1000 MG capsule Take 1 g by mouth daily.    Marland Kitchen glipiZIDE (GLUCOTROL XL) 10 MG 24 hr tablet TAKE ONE TABLET BY MOUTH ONCE DAILY 90 tablet 3  . glucose blood (ACCU-CHEK AVIVA PLUS) test strip Use as instructed 100 each 4  . hydrochlorothiazide (HYDRODIURIL) 25 MG tablet TAKE ONE TABLET BY MOUTH ONCE DAILY 30 tablet 5  . losartan (COZAAR) 100 MG tablet Take 1 tablet (100 mg total) by mouth daily. 30 tablet 5  . pravastatin (PRAVACHOL) 40 MG tablet TAKE ONE TABLET BY MOUTH ONCE  DAILY 30 tablet 5  . sitaGLIPtin (JANUVIA) 50 MG tablet Take 1 tablet (50 mg total) by mouth daily. 90 tablet 3   No current facility-administered medications on file prior to visit.   Allergies  Allergen Reactions  . Ace Inhibitors Hives  . Rituximab Other (See Comments)    This medication caused him to sweat & shake      Review of Systems  Constitutional: Negative for fever, chills, activity change, appetite change and fatigue.  HENT: Negative.   Eyes: Negative.   Respiratory: Negative for cough and shortness of breath.   Cardiovascular: Negative for chest pain and leg swelling.  Gastrointestinal: Negative for nausea, vomiting, abdominal pain, diarrhea and constipation.  Genitourinary: Negative for dysuria, urgency, frequency and hematuria.  Neurological: Negative for dizziness, syncope, weakness, light-headedness, numbness and headaches.       Objective:   Physical Exam  Constitutional: He is oriented to person, place, and time. He appears well-developed and well-nourished. No distress.  HENT:  Nose: Nose normal.  Mouth/Throat: Oropharynx is clear and moist.  Eyes: Conjunctivae and EOM are normal. Pupils are equal, round, and reactive to light.  Neck: Normal range of motion. Neck supple. No thyromegaly present.  Cardiovascular: Normal rate, regular rhythm, normal heart sounds and intact distal pulses.   Pulmonary/Chest: Effort normal and breath sounds normal.  Abdominal: Soft. Bowel sounds are normal.  Lymphadenopathy:  He has no cervical adenopathy.  Neurological: He is alert and oriented to person, place, and time.  Skin: Skin is warm and dry.  Psychiatric: He has a normal mood and affect. His behavior is normal. Judgment and thought content normal.          Assessment & Plan:  1. Type 2 diabetes mellitus with chronic kidney disease, without long-term current use of insulin, unspecified CKD stage ([place]) Awaiting lab results. Needs microalbumin today. -  Comprehensive metabolic panel - Hemoglobin A1c - Microalbumin, urine  2. Chronic kidney disease, stage III (moderate) Awaiting lab results. Follow up nephrology rec. - Comprehensive metabolic panel  3. Hyperlipidemia Awaiting lab results.Result in January were normal. Continue current mediation regimen at this time. - Lipid panel  4. Screening for colon cancer - Ambulatory referral to Gastroenterology  Patient to schedule appointment with dentist and ophthalmologist.   Melina Schools PA-S 10/21/2015

## 2015-10-22 ENCOUNTER — Encounter: Payer: Self-pay | Admitting: Physician Assistant

## 2015-10-31 ENCOUNTER — Ambulatory Visit: Payer: Medicare Other

## 2015-10-31 ENCOUNTER — Other Ambulatory Visit (HOSPITAL_BASED_OUTPATIENT_CLINIC_OR_DEPARTMENT_OTHER): Payer: Medicare Other

## 2015-10-31 ENCOUNTER — Ambulatory Visit (HOSPITAL_BASED_OUTPATIENT_CLINIC_OR_DEPARTMENT_OTHER): Payer: Medicare Other

## 2015-10-31 VITALS — BP 128/58 | HR 80 | Temp 97.9°F | Resp 18

## 2015-10-31 DIAGNOSIS — Z95828 Presence of other vascular implants and grafts: Secondary | ICD-10-CM | POA: Insufficient documentation

## 2015-10-31 DIAGNOSIS — Z5112 Encounter for antineoplastic immunotherapy: Secondary | ICD-10-CM

## 2015-10-31 DIAGNOSIS — C8338 Diffuse large B-cell lymphoma, lymph nodes of multiple sites: Secondary | ICD-10-CM | POA: Diagnosis not present

## 2015-10-31 LAB — CBC WITH DIFFERENTIAL/PLATELET
BASO%: 0.5 % (ref 0.0–2.0)
Basophils Absolute: 0 10*3/uL (ref 0.0–0.1)
EOS%: 2 % (ref 0.0–7.0)
Eosinophils Absolute: 0.2 10*3/uL (ref 0.0–0.5)
HCT: 39 % (ref 38.4–49.9)
HGB: 13 g/dL (ref 13.0–17.1)
LYMPH%: 12.7 % — AB (ref 14.0–49.0)
MCH: 28.7 pg (ref 27.2–33.4)
MCHC: 33.4 g/dL (ref 32.0–36.0)
MCV: 85.8 fL (ref 79.3–98.0)
MONO#: 0.6 10*3/uL (ref 0.1–0.9)
MONO%: 6.1 % (ref 0.0–14.0)
NEUT#: 7.4 10*3/uL — ABNORMAL HIGH (ref 1.5–6.5)
NEUT%: 78.7 % — AB (ref 39.0–75.0)
PLATELETS: 189 10*3/uL (ref 140–400)
RBC: 4.54 10*6/uL (ref 4.20–5.82)
RDW: 13.3 % (ref 11.0–14.6)
WBC: 9.4 10*3/uL (ref 4.0–10.3)
lymph#: 1.2 10*3/uL (ref 0.9–3.3)

## 2015-10-31 LAB — COMPREHENSIVE METABOLIC PANEL
ALT: 33 U/L (ref 0–55)
ANION GAP: 10 meq/L (ref 3–11)
AST: 24 U/L (ref 5–34)
Albumin: 3.8 g/dL (ref 3.5–5.0)
Alkaline Phosphatase: 75 U/L (ref 40–150)
BUN: 33 mg/dL — ABNORMAL HIGH (ref 7.0–26.0)
CHLORIDE: 107 meq/L (ref 98–109)
CO2: 25 meq/L (ref 22–29)
CREATININE: 2 mg/dL — AB (ref 0.7–1.3)
Calcium: 9.4 mg/dL (ref 8.4–10.4)
EGFR: 40 mL/min/{1.73_m2} — ABNORMAL LOW (ref >90)
Glucose: 148 mg/dl — ABNORMAL HIGH (ref 70–140)
Potassium: 3.9 mEq/L (ref 3.5–5.1)
SODIUM: 141 meq/L (ref 136–145)
Total Bilirubin: 0.48 mg/dL (ref 0.20–1.20)
Total Protein: 6.6 g/dL (ref 6.4–8.3)

## 2015-10-31 LAB — LACTATE DEHYDROGENASE: LDH: 157 U/L (ref 125–245)

## 2015-10-31 MED ORDER — DIPHENHYDRAMINE HCL 25 MG PO CAPS
50.0000 mg | ORAL_CAPSULE | Freq: Once | ORAL | Status: AC
  Administered 2015-10-31: 50 mg via ORAL

## 2015-10-31 MED ORDER — ACETAMINOPHEN 325 MG PO TABS
650.0000 mg | ORAL_TABLET | Freq: Once | ORAL | Status: AC
Start: 2015-10-31 — End: 2015-10-31
  Administered 2015-10-31: 650 mg via ORAL

## 2015-10-31 MED ORDER — SODIUM CHLORIDE 0.9 % IJ SOLN
10.0000 mL | INTRAMUSCULAR | Status: DC | PRN
  Administered 2015-10-31: 10 mL
  Filled 2015-10-31: qty 10

## 2015-10-31 MED ORDER — SODIUM CHLORIDE 0.9 % IJ SOLN
10.0000 mL | INTRAMUSCULAR | Status: DC | PRN
  Administered 2015-10-31: 10 mL via INTRAVENOUS
  Filled 2015-10-31: qty 10

## 2015-10-31 MED ORDER — SODIUM CHLORIDE 0.9 % IV SOLN
Freq: Once | INTRAVENOUS | Status: AC
  Administered 2015-10-31: 09:00:00 via INTRAVENOUS

## 2015-10-31 MED ORDER — HEPARIN SOD (PORK) LOCK FLUSH 100 UNIT/ML IV SOLN
500.0000 [IU] | Freq: Once | INTRAVENOUS | Status: AC | PRN
  Administered 2015-10-31: 500 [IU]
  Filled 2015-10-31: qty 5

## 2015-10-31 MED ORDER — DIPHENHYDRAMINE HCL 25 MG PO CAPS
ORAL_CAPSULE | ORAL | Status: AC
  Filled 2015-10-31: qty 2

## 2015-10-31 MED ORDER — ACETAMINOPHEN 325 MG PO TABS
ORAL_TABLET | ORAL | Status: AC
  Filled 2015-10-31: qty 2

## 2015-10-31 MED ORDER — SODIUM CHLORIDE 0.9 % IV SOLN
375.0000 mg/m2 | Freq: Once | INTRAVENOUS | Status: AC
  Administered 2015-10-31: 700 mg via INTRAVENOUS
  Filled 2015-10-31: qty 60

## 2015-10-31 NOTE — Patient Instructions (Signed)

## 2015-10-31 NOTE — Patient Instructions (Signed)
[facility] Discharge Instructions for Patients Receiving Chemotherapy  Today you received the following chemotherapy agents: Rituxan   To help prevent nausea and vomiting after your treatment, we encourage you to take your nausea medication as directed.    If you develop nausea and vomiting that is not controlled by your nausea medication, call the clinic.   BELOW ARE SYMPTOMS THAT SHOULD BE REPORTED IMMEDIATELY:  *FEVER GREATER THAN 100.5 F  *CHILLS WITH OR WITHOUT FEVER  NAUSEA AND VOMITING THAT IS NOT CONTROLLED WITH YOUR NAUSEA MEDICATION  *UNUSUAL SHORTNESS OF BREATH  *UNUSUAL BRUISING OR BLEEDING  TENDERNESS IN MOUTH AND THROAT WITH OR WITHOUT PRESENCE OF ULCERS  *URINARY PROBLEMS  *BOWEL PROBLEMS  UNUSUAL RASH Items with * indicate a potential emergency and should be followed up as soon as possible.  Feel free to call the clinic you have any questions or concerns. The clinic phone number is [phone].  Please show the CHEMO ALERT CARD at check-in to the Emergency Department and triage nurse.   

## 2015-12-30 ENCOUNTER — Ambulatory Visit: Payer: Medicare Other

## 2015-12-30 ENCOUNTER — Telehealth: Payer: Self-pay | Admitting: Hematology and Oncology

## 2015-12-30 ENCOUNTER — Ambulatory Visit (HOSPITAL_BASED_OUTPATIENT_CLINIC_OR_DEPARTMENT_OTHER): Payer: Medicare Other | Admitting: Hematology and Oncology

## 2015-12-30 ENCOUNTER — Ambulatory Visit (HOSPITAL_BASED_OUTPATIENT_CLINIC_OR_DEPARTMENT_OTHER): Payer: Medicare Other

## 2015-12-30 ENCOUNTER — Other Ambulatory Visit (HOSPITAL_BASED_OUTPATIENT_CLINIC_OR_DEPARTMENT_OTHER): Payer: Medicare Other

## 2015-12-30 VITALS — BP 150/82 | HR 65 | Temp 97.8°F | Resp 18 | Ht 70.0 in | Wt 168.3 lb

## 2015-12-30 VITALS — BP 132/78 | HR 64 | Temp 97.8°F | Resp 18

## 2015-12-30 DIAGNOSIS — Z5112 Encounter for antineoplastic immunotherapy: Secondary | ICD-10-CM

## 2015-12-30 DIAGNOSIS — R911 Solitary pulmonary nodule: Secondary | ICD-10-CM

## 2015-12-30 DIAGNOSIS — Z95828 Presence of other vascular implants and grafts: Secondary | ICD-10-CM

## 2015-12-30 DIAGNOSIS — C8338 Diffuse large B-cell lymphoma, lymph nodes of multiple sites: Secondary | ICD-10-CM | POA: Diagnosis not present

## 2015-12-30 DIAGNOSIS — N183 Chronic kidney disease, stage 3 unspecified: Secondary | ICD-10-CM

## 2015-12-30 LAB — COMPREHENSIVE METABOLIC PANEL
ALBUMIN: 3.8 g/dL (ref 3.5–5.0)
ALT: 31 U/L (ref 0–55)
ANION GAP: 9 meq/L (ref 3–11)
AST: 23 U/L (ref 5–34)
Alkaline Phosphatase: 76 U/L (ref 40–150)
BILIRUBIN TOTAL: 0.69 mg/dL (ref 0.20–1.20)
BUN: 28.4 mg/dL — ABNORMAL HIGH (ref 7.0–26.0)
CHLORIDE: 107 meq/L (ref 98–109)
CO2: 24 meq/L (ref 22–29)
CREATININE: 1.9 mg/dL — AB (ref 0.7–1.3)
Calcium: 9.2 mg/dL (ref 8.4–10.4)
EGFR: 41 mL/min/{1.73_m2} — AB (ref >90)
GLUCOSE: 132 mg/dL (ref 70–140)
Potassium: 3.7 mEq/L (ref 3.5–5.1)
Sodium: 140 mEq/L (ref 136–145)
TOTAL PROTEIN: 6.7 g/dL (ref 6.4–8.3)

## 2015-12-30 LAB — CBC WITH DIFFERENTIAL/PLATELET
BASO%: 0.6 % (ref 0.0–2.0)
Basophils Absolute: 0.1 10*3/uL (ref 0.0–0.1)
EOS ABS: 0.2 10*3/uL (ref 0.0–0.5)
EOS%: 1.7 % (ref 0.0–7.0)
HCT: 38.6 % (ref 38.4–49.9)
HEMOGLOBIN: 12.8 g/dL — AB (ref 13.0–17.1)
LYMPH%: 11.4 % — ABNORMAL LOW (ref 14.0–49.0)
MCH: 28.2 pg (ref 27.2–33.4)
MCHC: 33.2 g/dL (ref 32.0–36.0)
MCV: 84.8 fL (ref 79.3–98.0)
MONO#: 0.7 10*3/uL (ref 0.1–0.9)
MONO%: 6.9 % (ref 0.0–14.0)
NEUT%: 79.4 % — ABNORMAL HIGH (ref 39.0–75.0)
NEUTROS ABS: 7.7 10*3/uL — AB (ref 1.5–6.5)
PLATELETS: 183 10*3/uL (ref 140–400)
RBC: 4.55 10*6/uL (ref 4.20–5.82)
RDW: 13.1 % (ref 11.0–14.6)
WBC: 9.7 10*3/uL (ref 4.0–10.3)
lymph#: 1.1 10*3/uL (ref 0.9–3.3)

## 2015-12-30 LAB — LACTATE DEHYDROGENASE: LDH: 174 U/L (ref 125–245)

## 2015-12-30 MED ORDER — SODIUM CHLORIDE 0.9 % IJ SOLN
10.0000 mL | INTRAMUSCULAR | Status: DC | PRN
  Administered 2015-12-30: 10 mL
  Filled 2015-12-30: qty 10

## 2015-12-30 MED ORDER — DIPHENHYDRAMINE HCL 25 MG PO CAPS
ORAL_CAPSULE | ORAL | Status: AC
  Filled 2015-12-30: qty 2

## 2015-12-30 MED ORDER — ACETAMINOPHEN 325 MG PO TABS
ORAL_TABLET | ORAL | Status: AC
Start: 2015-12-30 — End: 2015-12-30
  Filled 2015-12-30: qty 2

## 2015-12-30 MED ORDER — SODIUM CHLORIDE 0.9 % IV SOLN
375.0000 mg/m2 | Freq: Once | INTRAVENOUS | Status: AC
  Administered 2015-12-30: 700 mg via INTRAVENOUS
  Filled 2015-12-30: qty 60

## 2015-12-30 MED ORDER — DIPHENHYDRAMINE HCL 25 MG PO CAPS
50.0000 mg | ORAL_CAPSULE | Freq: Once | ORAL | Status: AC
  Administered 2015-12-30: 50 mg via ORAL

## 2015-12-30 MED ORDER — ACETAMINOPHEN 325 MG PO TABS
650.0000 mg | ORAL_TABLET | Freq: Once | ORAL | Status: AC
Start: 2015-12-30 — End: 2015-12-30
  Administered 2015-12-30: 650 mg via ORAL

## 2015-12-30 MED ORDER — SODIUM CHLORIDE 0.9 % IJ SOLN
10.0000 mL | INTRAMUSCULAR | Status: DC | PRN
  Administered 2015-12-30: 10 mL via INTRAVENOUS
  Filled 2015-12-30: qty 10

## 2015-12-30 MED ORDER — SODIUM CHLORIDE 0.9 % IV SOLN
Freq: Once | INTRAVENOUS | Status: AC
  Administered 2015-12-30: 10:00:00 via INTRAVENOUS

## 2015-12-30 MED ORDER — HEPARIN SOD (PORK) LOCK FLUSH 100 UNIT/ML IV SOLN
500.0000 [IU] | Freq: Once | INTRAVENOUS | Status: AC | PRN
  Administered 2015-12-30: 500 [IU]
  Filled 2015-12-30: qty 5

## 2015-12-30 NOTE — Assessment & Plan Note (Signed)
He has chronic kidney disease and this is unrelated to the lymphoma.  Continue close monitoring. he will continue current medical management. I recommend close follow-up with primary care doctor for medication adjustment. 

## 2015-12-30 NOTE — Patient Instructions (Signed)

## 2015-12-30 NOTE — Assessment & Plan Note (Signed)
Clinically, he has no evidence of disease. I will continue rituximab. I will see him back in 2 months

## 2015-12-30 NOTE — Telephone Encounter (Signed)
per pof to sch pt appt-gave pt copy of avs-sch appt

## 2015-12-30 NOTE — Progress Notes (Signed)
[place] OFFICE PROGRESS NOTE  Patient Care Team: Harrison Mons, PA-C as PCP - General (Physician Assistant) Corliss Parish, MD as Consulting Physician (Nephrology) Fanny Skates, MD as Consulting Physician (General Surgery) Heath Lark, MD as Consulting Physician (Hematology and Oncology)  SUMMARY OF ONCOLOGIC HISTORY: Oncology History   Lymphoma   Staging form: Lymphoid Neoplasms, AJCC 6th Edition     Clinical stage from 11/21/2014: Stage IV - Signed by Heath Lark, MD on 11/21/2014       Diffuse large B-cell lymphoma of lymph nodes of multiple regions ([place])   11/13/2014 Imaging PEt scan showed widespread disease including possible splenic involvement   11/19/2014 Surgery He has excisional LN biopsy and port placement   11/19/2014 Pathology Results Biopsy of lymph nodes showed DLBCL   11/29/2014 - 01/31/2015 Chemotherapy He received 4 cycles of RCHOP chemotherapy complicated by severe infusion reaction.   12/19/2014 - 01/09/2015 Chemotherapy He received 2 cycles of prophylactic IT chemotherapy.   01/31/2015 Adverse Reaction I reduce the dose of his chemotherapy, prednisone and dexamethasone due to poorly controlled diabetes and hyperglycemia   02/12/2015 Imaging  repeat PET CT scan show complete response to chemotherapy but new lung infiltrates   09/09/2015 Imaging PET CT showed complete response except for new inflammatory change in the lung    INTERVAL HISTORY: Please see below for problem oriented charting. He is seen prior to cycle 5 of treatment. He denies new lymphadenopathy. No recent infection. He feels well, denies recent anorexia, weight loss or night sweats REVIEW OF SYSTEMS:   Constitutional: Denies fevers, chills or abnormal weight loss Eyes: Denies blurriness of vision Ears, nose, mouth, throat, and face: Denies mucositis or sore throat Respiratory: Denies cough, dyspnea or wheezes Cardiovascular: Denies palpitation, chest discomfort or lower extremity  swelling Gastrointestinal:  Denies nausea, heartburn or change in bowel habits Skin: Denies abnormal skin rashes Lymphatics: Denies new lymphadenopathy or easy bruising Neurological:Denies numbness, tingling or new weaknesses Behavioral/Psych: Mood is stable, no new changes  All other systems were reviewed with the patient and are negative.  I have reviewed the past medical history, past surgical history, social history and family history with the patient and they are unchanged from previous note.  ALLERGIES:  is allergic to ace inhibitors and rituximab.  MEDICATIONS:  Current Outpatient Prescriptions  Medication Sig Dispense Refill  . amLODipine (NORVASC) 10 MG tablet TAKE ONE TABLET BY MOUTH ONCE DAILY 90 tablet 3  . aspirin 81 MG tablet Take 81 mg by mouth daily.    . fish oil-omega-3 fatty acids 1000 MG capsule Take 1 g by mouth daily.    Marland Kitchen glipiZIDE (GLUCOTROL XL) 10 MG 24 hr tablet TAKE ONE TABLET BY MOUTH ONCE DAILY 90 tablet 3  . glucose blood (ACCU-CHEK AVIVA PLUS) test strip Use as instructed 100 each 4  . hydrochlorothiazide (HYDRODIURIL) 25 MG tablet TAKE ONE TABLET BY MOUTH ONCE DAILY 90 tablet 3  . losartan (COZAAR) 100 MG tablet Take 1 tablet (100 mg total) by mouth daily. 90 tablet 3  . pravastatin (PRAVACHOL) 40 MG tablet TAKE ONE TABLET BY MOUTH ONCE DAILY 90 tablet 3  . sitaGLIPtin (JANUVIA) 50 MG tablet Take 1 tablet (50 mg total) by mouth daily. 90 tablet 3   No current facility-administered medications for this visit.    PHYSICAL EXAMINATION: ECOG PERFORMANCE STATUS: 0 - Asymptomatic  Filed Vitals:   12/30/15 0837  BP: 150/82  Pulse: 65  Temp: 97.8 F (36.6 C)  Resp: 18  Filed Weights   12/30/15 0837  Weight: 168 lb 4.8 oz (76.34 kg)    GENERAL:alert, no distress and comfortable SKIN: skin color, texture, turgor are normal, no rashes or significant lesions EYES: normal, Conjunctiva are pink and non-injected, sclera clear OROPHARYNX:no exudate, no  erythema and lips, buccal mucosa, and tongue normal  NECK: supple, thyroid normal size, non-tender, without nodularity LYMPH:  no palpable lymphadenopathy in the cervical, axillary or inguinal LUNGS: clear to auscultation and percussion with normal breathing effort HEART: regular rate & rhythm and no murmurs and no lower extremity edema ABDOMEN:abdomen soft, non-tender and normal bowel sounds Musculoskeletal:no cyanosis of digits and no clubbing  NEURO: alert & oriented x 3 with fluent speech, no focal motor/sensory deficits  LABORATORY DATA:  I have reviewed the data as listed    Component Value Date/Time   NA 141 10/31/2015 0803   NA 141 10/21/2015 0845   NA 139 07/17/2014   K 3.9 10/31/2015 0803   K 4.1 10/21/2015 0845   CL 103 10/21/2015 0845   CO2 25 10/31/2015 0803   CO2 29 10/21/2015 0845   GLUCOSE 148* 10/31/2015 0803   GLUCOSE 132* 10/21/2015 0845   BUN 33.0* 10/31/2015 0803   BUN 28* 10/21/2015 0845   BUN 26* 07/17/2014   CREATININE 2.0* 10/31/2015 0803   CREATININE 1.89* 10/21/2015 0845   CREATININE 2.60* 11/23/2014 0314   CREATININE 2.1* 07/17/2014   CALCIUM 9.4 10/31/2015 0803   CALCIUM 9.2 10/21/2015 0845   PROT 6.6 10/31/2015 0803   PROT 6.6 10/21/2015 0845   ALBUMIN 3.8 10/31/2015 0803   ALBUMIN 4.4 10/21/2015 0845   AST 24 10/31/2015 0803   AST 23 10/21/2015 0845   ALT 33 10/31/2015 0803   ALT 26 10/21/2015 0845   ALKPHOS 75 10/31/2015 0803   ALKPHOS 72 10/21/2015 0845   BILITOT 0.48 10/31/2015 0803   BILITOT 0.6 10/21/2015 0845   GFRNONAA 26* 11/22/2014 2357   GFRNONAA 33* 09/11/2013 0920   GFRAA 30* 11/22/2014 2357   GFRAA 38* 09/11/2013 0920    No results found for: SPEP, UPEP  Lab Results  Component Value Date   WBC 9.7 12/30/2015   NEUTROABS 7.7* 12/30/2015   HGB 12.8* 12/30/2015   HCT 38.6 12/30/2015   MCV 84.8 12/30/2015   PLT 183 12/30/2015      Chemistry      Component Value Date/Time   NA 141 10/31/2015 0803   NA 141  10/21/2015 0845   NA 139 07/17/2014   K 3.9 10/31/2015 0803   K 4.1 10/21/2015 0845   CL 103 10/21/2015 0845   CO2 25 10/31/2015 0803   CO2 29 10/21/2015 0845   BUN 33.0* 10/31/2015 0803   BUN 28* 10/21/2015 0845   BUN 26* 07/17/2014   CREATININE 2.0* 10/31/2015 0803   CREATININE 1.89* 10/21/2015 0845   CREATININE 2.60* 11/23/2014 0314   CREATININE 2.1* 07/17/2014   GLU 382* 01/31/2015 1551      Component Value Date/Time   CALCIUM 9.4 10/31/2015 0803   CALCIUM 9.2 10/21/2015 0845   ALKPHOS 75 10/31/2015 0803   ALKPHOS 72 10/21/2015 0845   AST 24 10/31/2015 0803   AST 23 10/21/2015 0845   ALT 33 10/31/2015 0803   ALT 26 10/21/2015 0845   BILITOT 0.48 10/31/2015 0803   BILITOT 0.6 10/21/2015 0845      ASSESSMENT & PLAN:  Diffuse large B-cell lymphoma of lymph nodes of multiple regions ([place]) Clinically, he has no evidence of disease. I  will continue rituximab. I will see him back in 2 months   Nodule of left lung This is discovered incidentally. He is not symptomatic. I recommend observation only and repeat CT scan of the chest in August 2017.    Chronic kidney disease, stage III (moderate) He has chronic kidney disease and this is unrelated to the lymphoma.  Continue close monitoring. he will continue current medical management. I recommend close follow-up with primary care doctor for medication adjustment.   Orders Placed This Encounter  Procedures  . CT Chest High Resolution    Standing Status: Future     Number of Occurrences:      Standing Expiration Date: 02/28/2017    Order Specific Question:  Reason for Exam (SYMPTOM  OR DIAGNOSIS REQUIRED)    Answer:  lung nodules, Hx lymphoma    Order Specific Question:  Preferred imaging location?    Answer:  [facility]   All questions were answered. The patient knows to call the clinic with any problems, questions or concerns. No barriers to learning was detected. I spent 15 minutes counseling the  patient face to face. The total time spent in the appointment was 20 minutes and more than 50% was on counseling and review of test results     [facility], [place], MD 12/30/2015 8:58 AM

## 2015-12-30 NOTE — Assessment & Plan Note (Signed)
This is discovered incidentally. He is not symptomatic. I recommend observation only and repeat CT scan of the chest in August 2017.

## 2015-12-30 NOTE — Patient Instructions (Signed)
[facility] Discharge Instructions for Patients Receiving Chemotherapy  Today you received the following chemotherapy agents: Rituxan   To help prevent nausea and vomiting after your treatment, we encourage you to take your nausea medication as directed.    If you develop nausea and vomiting that is not controlled by your nausea medication, call the clinic.   BELOW ARE SYMPTOMS THAT SHOULD BE REPORTED IMMEDIATELY:  *FEVER GREATER THAN 100.5 F  *CHILLS WITH OR WITHOUT FEVER  NAUSEA AND VOMITING THAT IS NOT CONTROLLED WITH YOUR NAUSEA MEDICATION  *UNUSUAL SHORTNESS OF BREATH  *UNUSUAL BRUISING OR BLEEDING  TENDERNESS IN MOUTH AND THROAT WITH OR WITHOUT PRESENCE OF ULCERS  *URINARY PROBLEMS  *BOWEL PROBLEMS  UNUSUAL RASH Items with * indicate a potential emergency and should be followed up as soon as possible.  Feel free to call the clinic you have any questions or concerns. The clinic phone number is [phone].  Please show the CHEMO ALERT CARD at check-in to the Emergency Department and triage nurse.   

## 2016-01-27 ENCOUNTER — Encounter: Payer: Self-pay | Admitting: Physician Assistant

## 2016-01-27 ENCOUNTER — Ambulatory Visit (INDEPENDENT_AMBULATORY_CARE_PROVIDER_SITE_OTHER): Payer: Medicare Other | Admitting: Physician Assistant

## 2016-01-27 VITALS — BP 120/76 | HR 70 | Temp 97.9°F | Resp 16 | Ht 70.0 in | Wt 165.6 lb

## 2016-01-27 DIAGNOSIS — I129 Hypertensive chronic kidney disease with stage 1 through stage 4 chronic kidney disease, or unspecified chronic kidney disease: Secondary | ICD-10-CM | POA: Diagnosis not present

## 2016-01-27 DIAGNOSIS — C8338 Diffuse large B-cell lymphoma, lymph nodes of multiple sites: Secondary | ICD-10-CM

## 2016-01-27 DIAGNOSIS — Z1211 Encounter for screening for malignant neoplasm of colon: Secondary | ICD-10-CM | POA: Diagnosis not present

## 2016-01-27 DIAGNOSIS — E1122 Type 2 diabetes mellitus with diabetic chronic kidney disease: Secondary | ICD-10-CM

## 2016-01-27 DIAGNOSIS — E785 Hyperlipidemia, unspecified: Secondary | ICD-10-CM

## 2016-01-27 DIAGNOSIS — Z23 Encounter for immunization: Secondary | ICD-10-CM

## 2016-01-27 MED ORDER — ZOSTER VACCINE LIVE 19400 UNT/0.65ML ~~LOC~~ SUSR: 0.6500 mL | Freq: Once | SUBCUTANEOUS | 0 refills | Status: AC

## 2016-01-27 MED ORDER — AMLODIPINE BESYLATE 10 MG PO TABS: ORAL_TABLET | ORAL | 3 refills | Status: DC

## 2016-01-27 MED ORDER — SITAGLIPTIN PHOSPHATE 50 MG PO TABS: 50.0000 mg | ORAL_TABLET | Freq: Every day | ORAL | 12 refills | Status: DC

## 2016-01-27 MED ORDER — GLIPIZIDE ER 10 MG PO TB24: ORAL_TABLET | ORAL | 3 refills | Status: DC

## 2016-01-27 NOTE — Patient Instructions (Signed)
Please get your eye exam and the shingles vaccine!      IF you received an x-ray today, you will receive an invoice from [facility] Radiology. Please contact [facility] Radiology at [phone] with questions or concerns regarding your invoice.   IF you received labwork today, you will receive an invoice from [employer]. Please contact [facility] at [phone] with questions or concerns regarding your invoice.   Our billing staff will not be able to assist you with questions regarding bills from these companies.  You will be contacted with the lab results as soon as they are available. The fastest way to get your results is to activate your My Chart account. Instructions are located on the last page of this paperwork. If you have not heard from [place] regarding the results in 2 weeks, please contact this office.

## 2016-01-27 NOTE — Progress Notes (Signed)
[name], male    DOB: [birth year], 65 y.o.   MRN: [id number]  PCP: Harrison Mons, PA-C  Subjective:   Chief Complaint  Patient presents with  . Follow-up    3 month follow up for Diabetes     HPI Presents for evaluation of diabetes.  Reports that he is doing well. No problems, concerns or adverse medication effects. Follows with oncology Q2 months for CLL. Last visit was in June, at which time he also received an infusion. CMET at that time was stable, serum creatinine was 1.9, BUN 28.4  Has not received Shingles vaccine. Still has the prescription. Did not get message from GI to schedule colonoscopy, so referral closed. Asks for number to call them back.     Review of Systems  Constitutional: Negative for activity change, appetite change, fatigue and unexpected weight change.  HENT: Negative for congestion, dental problem, ear pain, hearing loss, mouth sores, postnasal drip, rhinorrhea, sneezing, sore throat, tinnitus and trouble swallowing.   Eyes: Negative for photophobia, pain, redness and visual disturbance.  Respiratory: Negative for cough, chest tightness and shortness of breath.   Cardiovascular: Negative for chest pain, palpitations and leg swelling.  Gastrointestinal: Negative for abdominal pain, blood in stool, constipation, diarrhea, nausea and vomiting.  Genitourinary: Negative for dysuria, frequency, hematuria and urgency.  Musculoskeletal: Negative for arthralgias, gait problem, myalgias and neck stiffness.  Skin: Negative for rash.  Neurological: Negative for dizziness, speech difficulty, weakness, light-headedness, numbness and headaches.  Hematological: Negative for adenopathy.  Psychiatric/Behavioral: Negative for confusion and sleep disturbance. The patient is not nervous/anxious.        Patient Active Problem List   Diagnosis Date Noted  . Port catheter in place 10/31/2015  . Nodule of left lung 09/11/2015  . Restrictive lung  disease 04/23/2015  . Reactive thrombocytosis 01/09/2015  . Elevated liver enzymes 01/09/2015  . Leukocytosis 12/19/2014  . Thrombocytopenia due to drugs 12/11/2014  . Diffuse large B-cell lymphoma of lymph nodes of multiple regions ([place]) 11/04/2014  . Anemia in neoplastic disease 11/04/2014  . Hyperlipidemia 05/11/2013  . Hypertensive renal disease   . DM (diabetes mellitus), type 2 with renal complications ([place])   . Chronic kidney disease, stage III (moderate)   . History of tobacco abuse   . Erectile dysfunction      Prior to Admission medications   Medication Sig Start Date End Date Taking? Authorizing Provider  amLODipine (NORVASC) 10 MG tablet TAKE ONE TABLET BY MOUTH ONCE DAILY 03/27/15  Yes Wendy Hoback, PA-C  aspirin 81 MG tablet Take 81 mg by mouth daily.   Yes Historical Provider, MD  fish oil-omega-3 fatty acids 1000 MG capsule Take 1 g by mouth daily.   Yes Historical Provider, MD  glipiZIDE (GLUCOTROL XL) 10 MG 24 hr tablet TAKE ONE TABLET BY MOUTH ONCE DAILY 03/27/15  Yes Arul Farabee, PA-C  glucose blood (ACCU-CHEK AVIVA PLUS) test strip Use as instructed 09/24/14  Yes Amberli Ruegg, PA-C  hydrochlorothiazide (HYDRODIURIL) 25 MG tablet TAKE ONE TABLET BY MOUTH ONCE DAILY 10/21/15  Yes Kirstine Jacquin, PA-C  losartan (COZAAR) 100 MG tablet Take 1 tablet (100 mg total) by mouth daily. 10/21/15  Yes Ellery Meroney, PA-C  pravastatin (PRAVACHOL) 40 MG tablet TAKE ONE TABLET BY MOUTH ONCE DAILY 10/21/15  Yes Samantha Olivera, PA-C  sitaGLIPtin (JANUVIA) 50 MG tablet Take 1 tablet (50 mg total) by mouth daily. 03/27/15  Yes Harrison Mons, PA-C     Allergies  Allergen Reactions  .  Ace Inhibitors Hives  . Rituximab Other (See Comments)    This medication caused him to sweat & shake       Objective:  Physical Exam  Constitutional: He is oriented to person, place, and time. He appears well-developed and well-nourished. He is active and cooperative. No distress.  BP 120/76    Pulse 70   Temp 97.9 F (36.6 C) (Oral)   Resp 16   Ht '5\' 10"'$  (1.778 m)   Wt 165 lb 9.6 oz (75.1 kg)   SpO2 100%   BMI 23.76 kg/m   HENT:  Head: Normocephalic and atraumatic.  Right Ear: Hearing normal.  Left Ear: Hearing normal.  Eyes: Conjunctivae are normal. No scleral icterus.  Neck: Normal range of motion. Neck supple. No thyromegaly present.  Cardiovascular: Normal rate, regular rhythm and normal heart sounds.   Pulses:      Radial pulses are 2+ on the right side, and 2+ on the left side.  Pulmonary/Chest: Effort normal and breath sounds normal.  Lymphadenopathy:       Head (right side): No tonsillar, no preauricular, no posterior auricular and no occipital adenopathy present.       Head (left side): No tonsillar, no preauricular, no posterior auricular and no occipital adenopathy present.    He has no cervical adenopathy.       Right: No supraclavicular adenopathy present.       Left: No supraclavicular adenopathy present.  Neurological: He is alert and oriented to person, place, and time. No sensory deficit.  Skin: Skin is warm, dry and intact. No rash noted. No cyanosis or erythema. Nails show no clubbing.  Psychiatric: He has a normal mood and affect. His speech is normal and behavior is normal.           Assessment & Plan:   1. Type 2 diabetes mellitus with chronic kidney disease, without long-term current use of insulin, unspecified CKD stage ([place]) Await A1C. Medication doses are limited by chronic kidney disease. - sitaGLIPtin (JANUVIA) 50 MG tablet; Take 1 tablet (50 mg total) by mouth daily.  Dispense: 30 tablet; Refill: 12 - glipiZIDE (GLUCOTROL XL) 10 MG 24 hr tablet; TAKE ONE TABLET BY MOUTH ONCE DAILY  Dispense: 90 tablet; Refill: 3 - Hemoglobin A1c  2. Hypertensive renal disease, stage 1-4 or unspecified chronic kidney disease ([place]) Stable. Controlled pressure. Continue current treatment. - amLODipine (NORVASC) 10 MG tablet; TAKE ONE TABLET BY MOUTH  ONCE DAILY  Dispense: 90 tablet; Refill: 3  3. Hyperlipidemia Stable.  4. Diffuse large B-cell lymphoma of lymph nodes of multiple regions [facility]) Continue follow-up with oncology.  5. Need for shingles vaccine - Zoster Vaccine Live, PF, (ZOSTAVAX) 44628 UNT/0.65ML injection; Inject 19,400 Units into the skin once.  Dispense: 1 each; Refill: 0  6. Screening for colon cancer - Ambulatory referral to Gastroenterology   Fara Chute, PA-C Physician Assistant-Certified [facility]

## 2016-01-28 LAB — HEMOGLOBIN A1C
Hgb A1c MFr Bld: 6.8 % — ABNORMAL HIGH (ref <5.7)
Mean Plasma Glucose: 148 mg/dL

## 2016-02-01 ENCOUNTER — Encounter: Payer: Self-pay | Admitting: Physician Assistant

## 2016-02-27 ENCOUNTER — Other Ambulatory Visit: Payer: Self-pay | Admitting: *Deleted

## 2016-02-27 DIAGNOSIS — R911 Solitary pulmonary nodule: Secondary | ICD-10-CM

## 2016-03-01 ENCOUNTER — Encounter (HOSPITAL_COMMUNITY): Payer: Self-pay

## 2016-03-01 ENCOUNTER — Ambulatory Visit (HOSPITAL_COMMUNITY)
Admission: RE | Admit: 2016-03-01 | Discharge: 2016-03-01 | Disposition: A | Discharge status: Home / Self Care | Payer: Medicare Other | Source: Ambulatory Visit | Attending: Hematology and Oncology | Admitting: Hematology and Oncology

## 2016-03-01 DIAGNOSIS — C8338 Diffuse large B-cell lymphoma, lymph nodes of multiple sites: Secondary | ICD-10-CM | POA: Diagnosis not present

## 2016-03-01 DIAGNOSIS — J439 Emphysema, unspecified: Secondary | ICD-10-CM | POA: Diagnosis not present

## 2016-03-01 DIAGNOSIS — I700 Atherosclerosis of aorta: Secondary | ICD-10-CM | POA: Insufficient documentation

## 2016-03-01 DIAGNOSIS — R911 Solitary pulmonary nodule: Secondary | ICD-10-CM

## 2016-03-01 DIAGNOSIS — I313 Pericardial effusion (noninflammatory): Secondary | ICD-10-CM | POA: Insufficient documentation

## 2016-03-02 ENCOUNTER — Telehealth: Payer: Self-pay | Admitting: Hematology and Oncology

## 2016-03-02 ENCOUNTER — Ambulatory Visit (HOSPITAL_BASED_OUTPATIENT_CLINIC_OR_DEPARTMENT_OTHER): Payer: Medicare Other

## 2016-03-02 ENCOUNTER — Ambulatory Visit: Payer: Medicare Other

## 2016-03-02 ENCOUNTER — Other Ambulatory Visit (HOSPITAL_BASED_OUTPATIENT_CLINIC_OR_DEPARTMENT_OTHER): Payer: Medicare Other

## 2016-03-02 ENCOUNTER — Encounter: Payer: Self-pay | Admitting: Hematology and Oncology

## 2016-03-02 ENCOUNTER — Ambulatory Visit (HOSPITAL_BASED_OUTPATIENT_CLINIC_OR_DEPARTMENT_OTHER): Payer: Medicare Other | Admitting: Hematology and Oncology

## 2016-03-02 VITALS — BP 123/66 | HR 66 | Temp 97.4°F | Resp 17

## 2016-03-02 DIAGNOSIS — C8338 Diffuse large B-cell lymphoma, lymph nodes of multiple sites: Secondary | ICD-10-CM | POA: Diagnosis not present

## 2016-03-02 DIAGNOSIS — N183 Chronic kidney disease, stage 3 unspecified: Secondary | ICD-10-CM

## 2016-03-02 DIAGNOSIS — R911 Solitary pulmonary nodule: Secondary | ICD-10-CM | POA: Diagnosis not present

## 2016-03-02 DIAGNOSIS — Z95828 Presence of other vascular implants and grafts: Secondary | ICD-10-CM

## 2016-03-02 DIAGNOSIS — Z5112 Encounter for antineoplastic immunotherapy: Secondary | ICD-10-CM | POA: Diagnosis not present

## 2016-03-02 DIAGNOSIS — I10 Essential (primary) hypertension: Secondary | ICD-10-CM | POA: Insufficient documentation

## 2016-03-02 LAB — COMPREHENSIVE METABOLIC PANEL
ALT: 27 U/L (ref 0–55)
AST: 26 U/L (ref 5–34)
Albumin: 3.8 g/dL (ref 3.5–5.0)
Alkaline Phosphatase: 81 U/L (ref 40–150)
Anion Gap: 11 mEq/L (ref 3–11)
BUN: 25.7 mg/dL (ref 7.0–26.0)
CHLORIDE: 106 meq/L (ref 98–109)
CO2: 23 meq/L (ref 22–29)
CREATININE: 1.9 mg/dL — AB (ref 0.7–1.3)
Calcium: 9.5 mg/dL (ref 8.4–10.4)
EGFR: 42 mL/min/{1.73_m2} — ABNORMAL LOW (ref >90)
GLUCOSE: 158 mg/dL — AB (ref 70–140)
Potassium: 4 mEq/L (ref 3.5–5.1)
SODIUM: 140 meq/L (ref 136–145)
Total Bilirubin: 0.63 mg/dL (ref 0.20–1.20)
Total Protein: 6.8 g/dL (ref 6.4–8.3)

## 2016-03-02 LAB — CBC WITH DIFFERENTIAL/PLATELET
BASO%: 0.4 % (ref 0.0–2.0)
Basophils Absolute: 0 10*3/uL (ref 0.0–0.1)
EOS%: 1.6 % (ref 0.0–7.0)
Eosinophils Absolute: 0.2 10*3/uL (ref 0.0–0.5)
HEMATOCRIT: 40.7 % (ref 38.4–49.9)
HGB: 13.5 g/dL (ref 13.0–17.1)
LYMPH#: 1.2 10*3/uL (ref 0.9–3.3)
LYMPH%: 11.3 % — AB (ref 14.0–49.0)
MCH: 28.3 pg (ref 27.2–33.4)
MCHC: 33.2 g/dL (ref 32.0–36.0)
MCV: 85.2 fL (ref 79.3–98.0)
MONO#: 0.5 10*3/uL (ref 0.1–0.9)
MONO%: 5.3 % (ref 0.0–14.0)
NEUT#: 8.4 10*3/uL — ABNORMAL HIGH (ref 1.5–6.5)
NEUT%: 81.4 % — AB (ref 39.0–75.0)
Platelets: 202 10*3/uL (ref 140–400)
RBC: 4.78 10*6/uL (ref 4.20–5.82)
RDW: 13.4 % (ref 11.0–14.6)
WBC: 10.4 10*3/uL — ABNORMAL HIGH (ref 4.0–10.3)

## 2016-03-02 LAB — LACTATE DEHYDROGENASE: LDH: 169 U/L (ref 125–245)

## 2016-03-02 MED ORDER — SODIUM CHLORIDE 0.9 % IJ SOLN
10.0000 mL | INTRAMUSCULAR | Status: DC | PRN
  Administered 2016-03-02: 10 mL
  Filled 2016-03-02: qty 10

## 2016-03-02 MED ORDER — SODIUM CHLORIDE 0.9 % IJ SOLN
10.0000 mL | INTRAMUSCULAR | Status: DC | PRN
  Administered 2016-03-02: 10 mL via INTRAVENOUS
  Filled 2016-03-02: qty 10

## 2016-03-02 MED ORDER — ACETAMINOPHEN 325 MG PO TABS
ORAL_TABLET | ORAL | Status: AC
  Filled 2016-03-02: qty 2

## 2016-03-02 MED ORDER — HEPARIN SOD (PORK) LOCK FLUSH 100 UNIT/ML IV SOLN
500.0000 [IU] | Freq: Once | INTRAVENOUS | Status: AC | PRN
  Administered 2016-03-02: 500 [IU]
  Filled 2016-03-02: qty 5

## 2016-03-02 MED ORDER — ACETAMINOPHEN 325 MG PO TABS
650.0000 mg | ORAL_TABLET | Freq: Once | ORAL | Status: AC
Start: 2016-03-02 — End: 2016-03-02
  Administered 2016-03-02: 650 mg via ORAL

## 2016-03-02 MED ORDER — SODIUM CHLORIDE 0.9 % IV SOLN
Freq: Once | INTRAVENOUS | Status: AC
  Administered 2016-03-02: 10:00:00 via INTRAVENOUS

## 2016-03-02 MED ORDER — DIPHENHYDRAMINE HCL 25 MG PO CAPS
50.0000 mg | ORAL_CAPSULE | Freq: Once | ORAL | Status: AC
  Administered 2016-03-02: 50 mg via ORAL

## 2016-03-02 MED ORDER — SODIUM CHLORIDE 0.9 % IV SOLN
375.0000 mg/m2 | Freq: Once | INTRAVENOUS | Status: AC
  Administered 2016-03-02: 700 mg via INTRAVENOUS
  Filled 2016-03-02: qty 50

## 2016-03-02 MED ORDER — DIPHENHYDRAMINE HCL 25 MG PO CAPS
ORAL_CAPSULE | ORAL | Status: AC
  Filled 2016-03-02: qty 2

## 2016-03-02 NOTE — Assessment & Plan Note (Signed)
he will continue current medical management. I recommend close follow-up with primary care doctor for medication adjustment.  

## 2016-03-02 NOTE — Assessment & Plan Note (Signed)
He has chronic kidney disease and this is unrelated to the lymphoma.  Continue close monitoring. he will continue current medical management. I recommend close follow-up with primary care doctor for medication adjustment. 

## 2016-03-02 NOTE — Assessment & Plan Note (Signed)
There is slight progression of the left upper lung nodule. The patient is not symptomatic. I will consult with his pulmonologist for next plan of care. He may need to be seen by CT surgery if felt to be malignant

## 2016-03-02 NOTE — Assessment & Plan Note (Signed)
Clinically, he has no evidence of disease recurrence. I will continue rituximab. I will see him back in 2 months 

## 2016-03-02 NOTE — Progress Notes (Signed)
[place] OFFICE PROGRESS NOTE  Patient Care Team: Harrison Mons, PA-C as PCP - General (Physician Assistant) Corliss Parish, MD as Consulting Physician (Nephrology) Fanny Skates, MD as Consulting Physician (General Surgery) Heath Lark, MD as Consulting Physician (Hematology and Oncology)  SUMMARY OF ONCOLOGIC HISTORY: Oncology History   Lymphoma   Staging form: Lymphoid Neoplasms, AJCC 6th Edition     Clinical stage from 11/21/2014: Stage IV - Signed by Heath Lark, MD on 11/21/2014       Diffuse large B-cell lymphoma of lymph nodes of multiple regions ([place])   11/13/2014 Imaging    PEt scan showed widespread disease including possible splenic involvement      11/19/2014 Surgery    He has excisional LN biopsy and port placement      11/19/2014 Pathology Results    Biopsy of lymph nodes showed DLBCL      11/29/2014 - 01/31/2015 Chemotherapy    He received 4 cycles of RCHOP chemotherapy complicated by severe infusion reaction.      12/19/2014 - 01/09/2015 Chemotherapy    He received 2 cycles of prophylactic IT chemotherapy.      01/31/2015 Adverse Reaction    I reduce the dose of his chemotherapy, prednisone and dexamethasone due to poorly controlled diabetes and hyperglycemia      02/12/2015 Imaging     repeat PET CT scan show complete response to chemotherapy but new lung infiltrates      09/09/2015 Imaging    PET CT showed complete response except for new inflammatory change in the lung      03/01/2016 Imaging    CT chest showed mild interval growth of subsolid left upper lobe pulmonary nodule, which was hypermetabolic on 16/01/3709 PET-CT. A primary bronchogenic adenocarcinoma cannot be excluded. Thoracic surgical consultation is advised. No lymphadenopathy or other findings of metastatic disease in the chest. Moderate emphysema with mild diffuse bronchial wall thickening, suggesting COPD. Aortic atherosclerosis. Stable small pericardial  effusion/thickening.       INTERVAL HISTORY: Please see below for problem oriented charting. He returned here for cycle 6 of treatment. He denies recent infection. No new lymphadenopathy. Denies recent cough. The patient is not smoking.  REVIEW OF SYSTEMS:   Constitutional: Denies fevers, chills or abnormal weight loss Eyes: Denies blurriness of vision Ears, nose, mouth, throat, and face: Denies mucositis or sore throat Respiratory: Denies cough, dyspnea or wheezes Cardiovascular: Denies palpitation, chest discomfort or lower extremity swelling Gastrointestinal:  Denies nausea, heartburn or change in bowel habits Skin: Denies abnormal skin rashes Lymphatics: Denies new lymphadenopathy or easy bruising Neurological:Denies numbness, tingling or new weaknesses Behavioral/Psych: Mood is stable, no new changes  All other systems were reviewed with the patient and are negative.  I have reviewed the past medical history, past surgical history, social history and family history with the patient and they are unchanged from previous note.  ALLERGIES:  is allergic to ace inhibitors and rituximab.  MEDICATIONS:  Current Outpatient Prescriptions  Medication Sig Dispense Refill  . amLODipine (NORVASC) 10 MG tablet TAKE ONE TABLET BY MOUTH ONCE DAILY 90 tablet 3  . aspirin 81 MG tablet Take 81 mg by mouth daily.    . fish oil-omega-3 fatty acids 1000 MG capsule Take 1 g by mouth daily.    Marland Kitchen glipiZIDE (GLUCOTROL XL) 10 MG 24 hr tablet TAKE ONE TABLET BY MOUTH ONCE DAILY 90 tablet 3  . glucose blood (ACCU-CHEK AVIVA PLUS) test strip Use as instructed 100 each 4  . hydrochlorothiazide (  HYDRODIURIL) 25 MG tablet TAKE ONE TABLET BY MOUTH ONCE DAILY 90 tablet 3  . losartan (COZAAR) 100 MG tablet Take 1 tablet (100 mg total) by mouth daily. 90 tablet 3  . pravastatin (PRAVACHOL) 40 MG tablet TAKE ONE TABLET BY MOUTH ONCE DAILY 90 tablet 3  . sitaGLIPtin (JANUVIA) 50 MG tablet Take 1 tablet (50 mg  total) by mouth daily. 30 tablet 12   No current facility-administered medications for this visit.    Facility-Administered Medications Ordered in Other Visits  Medication Dose Route Frequency Provider Last Rate Last Dose  . heparin lock flush 100 unit/mL  500 Units Intracatheter Once PRN Heath Lark, MD      . riTUXimab (RITUXAN) 700 mg in sodium chloride 0.9 % 250 mL (2.1875 mg/mL) chemo infusion  375 mg/m2 (Treatment Plan Recorded) Intravenous Once Heath Lark, MD      . sodium chloride 0.9 % injection 10 mL  10 mL Intracatheter PRN Heath Lark, MD        PHYSICAL EXAMINATION: ECOG PERFORMANCE STATUS: 1 - Symptomatic but completely ambulatory  Vitals:   03/02/16 0909  BP: (!) 150/60  Pulse: 71  Resp: 18  Temp: 97.8 F (36.6 C)   Filed Weights   03/02/16 0909  Weight: 168 lb 8 oz (76.4 kg)    GENERAL:alert, no distress and comfortable SKIN: skin color, texture, turgor are normal, no rashes or significant lesions EYES: normal, Conjunctiva are pink and non-injected, sclera clear OROPHARYNX:no exudate, no erythema and lips, buccal mucosa, and tongue normal  NECK: supple, thyroid normal size, non-tender, without nodularity LYMPH:  no palpable lymphadenopathy in the cervical, axillary or inguinal LUNGS: clear to auscultation and percussion with normal breathing effort HEART: regular rate & rhythm and no murmurs and no lower extremity edema ABDOMEN:abdomen soft, non-tender and normal bowel sounds Musculoskeletal:no cyanosis of digits and no clubbing  NEURO: alert & oriented x 3 with fluent speech, no focal motor/sensory deficits  LABORATORY DATA:  I have reviewed the data as listed    Component Value Date/Time   NA 140 03/02/2016 0841   K 4.0 03/02/2016 0841   CL 103 10/21/2015 0845   CO2 23 03/02/2016 0841   GLUCOSE 158 (H) 03/02/2016 0841   BUN 25.7 03/02/2016 0841   CREATININE 1.9 (H) 03/02/2016 0841   CALCIUM 9.5 03/02/2016 0841   PROT 6.8 03/02/2016 0841   ALBUMIN  3.8 03/02/2016 0841   AST 26 03/02/2016 0841   ALT 27 03/02/2016 0841   ALKPHOS 81 03/02/2016 0841   BILITOT 0.63 03/02/2016 0841   GFRNONAA 26 (L) 11/22/2014 2357   GFRNONAA 33 (L) 09/11/2013 0920   GFRAA 30 (L) 11/22/2014 2357   GFRAA 38 (L) 09/11/2013 0920    No results found for: SPEP, UPEP  Lab Results  Component Value Date   WBC 10.4 (H) 03/02/2016   NEUTROABS 8.4 (H) 03/02/2016   HGB 13.5 03/02/2016   HCT 40.7 03/02/2016   MCV 85.2 03/02/2016   PLT 202 03/02/2016      Chemistry      Component Value Date/Time   NA 140 03/02/2016 0841   K 4.0 03/02/2016 0841   CL 103 10/21/2015 0845   CO2 23 03/02/2016 0841   BUN 25.7 03/02/2016 0841   CREATININE 1.9 (H) 03/02/2016 0841   GLU 382 (H) 01/31/2015 1551      Component Value Date/Time   CALCIUM 9.5 03/02/2016 0841   ALKPHOS 81 03/02/2016 0841   AST 26 03/02/2016 0841  ALT 27 03/02/2016 0841   BILITOT 0.63 03/02/2016 0841       RADIOGRAPHIC STUDIES: I have personally reviewed the radiological images as listed and agreed with the findings in the report. Ct Chest High Resolution  Result Date: 03/01/2016 CLINICAL DATA:  Diffuse large B-cell lymphoma. Follow-up left lung nodule. EXAM: CT CHEST WITHOUT CONTRAST TECHNIQUE: Multidetector CT imaging of the chest was performed following the standard protocol without intravenous contrast. High resolution imaging of the lungs, as well as inspiratory and expiratory imaging, was performed. COMPARISON:  09/09/2015 PET-CT.  03/21/2015 chest CT. FINDINGS: Mediastinum/Nodes: Normal heart size. Stable small pericardial effusion/thickening. Atherosclerotic nonaneurysmal thoracic aorta. Normal caliber pulmonary arteries. Right subclavian MediPort terminates in the middle third of the superior vena cava. Heterogeneous thyroid gland without discrete thyroid nodule. Unremarkable esophagus. No pathologically enlarged axillary, mediastinal or gross hilar lymph nodes, noting limited sensitivity  for the detection of hilar adenopathy on this noncontrast study. Right axillary surgical clips are again noted. Lungs/Pleura: No pneumothorax. No pleural effusion. Moderate centrilobular and paraseptal emphysema with mild diffuse bronchial wall thickening. There is a subsolid 2.4 x 1.5 cm left upper lobe pulmonary nodule (series 8/ image 51), previously 2.2 x 1.4 cm on 09/09/2015 and 1.4 x 0.7 cm on 03/21/2015 using similar measurement technique, increased in size. Parenchymal bands at both lung bases are most consistent with scarring and/ or atelectasis. No acute consolidative airspace disease or additional significant pulmonary nodules. No significant regions of subpleural reticulation, traction bronchiectasis or frank honeycombing. No significant air trapping on the expiration sequence. Upper abdomen: Unremarkable. Musculoskeletal: No aggressive appearing focal osseous lesions. Mild thoracic spondylosis. IMPRESSION: 1. Mild interval growth of subsolid left upper lobe pulmonary nodule, which was hypermetabolic on 27/12/2374 PET-CT. A primary bronchogenic adenocarcinoma cannot be excluded. Thoracic surgical consultation is advised. 2. No lymphadenopathy or other findings of metastatic disease in the chest. 3. Moderate emphysema with mild diffuse bronchial wall thickening, suggesting COPD. 4. Aortic atherosclerosis. 5. Stable small pericardial effusion/thickening. These results will be called to the ordering clinician or representative by the Radiologist Assistant, and communication documented in the PACS or zVision Dashboard. Electronically Signed   By: Ilona Sorrel M.D.   On: 03/01/2016 17:03     ASSESSMENT & PLAN:  Diffuse large B-cell lymphoma of lymph nodes of multiple regions ([place]) Clinically, he has no evidence of disease recurrence. I will continue rituximab. I will see him back in 2 months  Chronic kidney disease, stage III (moderate) He has chronic kidney disease and this is unrelated to the  lymphoma.  Continue close monitoring. he will continue current medical management. I recommend close follow-up with primary care doctor for medication adjustment.  Nodule of left lung There is slight progression of the left upper lung nodule. The patient is not symptomatic. I will consult with his pulmonologist for next plan of care. He may need to be seen by CT surgery if felt to be malignant  Essential hypertension, benign he will continue current medical management. I recommend close follow-up with primary care doctor for medication adjustment.    No orders of the defined types were placed in this encounter.  All questions were answered. The patient knows to call the clinic with any problems, questions or concerns. No barriers to learning was detected. I spent 20 minutes counseling the patient face to face. The total time spent in the appointment was 25 minutes and more than 50% was on counseling and review of test results     Osborne, [place],  MD 03/02/2016 10:12 AM

## 2016-03-02 NOTE — Patient Instructions (Signed)
[facility] Discharge Instructions for Patients Receiving Chemotherapy  Today you received the following chemotherapy agents Rituxan To help prevent nausea and vomiting after your treatment, we encourage you to take your nausea medication as prescribed.  If you develop nausea and vomiting that is not controlled by your nausea medication, call the clinic.   BELOW ARE SYMPTOMS THAT SHOULD BE REPORTED IMMEDIATELY:  *FEVER GREATER THAN 100.5 F  *CHILLS WITH OR WITHOUT FEVER  NAUSEA AND VOMITING THAT IS NOT CONTROLLED WITH YOUR NAUSEA MEDICATION  *UNUSUAL SHORTNESS OF BREATH  *UNUSUAL BRUISING OR BLEEDING  TENDERNESS IN MOUTH AND THROAT WITH OR WITHOUT PRESENCE OF ULCERS  *URINARY PROBLEMS  *BOWEL PROBLEMS  UNUSUAL RASH Items with * indicate a potential emergency and should be followed up as soon as possible.  Feel free to call the clinic you have any questions or concerns. The clinic phone number is [phone].  Please show the CHEMO ALERT CARD at check-in to the Emergency Department and triage nurse.   

## 2016-03-02 NOTE — Telephone Encounter (Signed)
Gave patient avs report and appointments for October  °

## 2016-03-10 ENCOUNTER — Telehealth: Payer: Self-pay | Admitting: *Deleted

## 2016-03-10 ENCOUNTER — Other Ambulatory Visit: Payer: Self-pay | Admitting: Hematology and Oncology

## 2016-03-10 DIAGNOSIS — R911 Solitary pulmonary nodule: Secondary | ICD-10-CM

## 2016-03-11 ENCOUNTER — Telehealth: Payer: Self-pay | Admitting: *Deleted

## 2016-03-11 NOTE — Telephone Encounter (Signed)
S/w Cindy at [facility].  She has received the referral and will work on it.

## 2016-03-11 NOTE — Telephone Encounter (Signed)
Duplicate note opened in error  

## 2016-03-15 ENCOUNTER — Institutional Professional Consult (permissible substitution) (INDEPENDENT_AMBULATORY_CARE_PROVIDER_SITE_OTHER): Payer: Medicare Other | Admitting: Thoracic Surgery (Cardiothoracic Vascular Surgery)

## 2016-03-15 ENCOUNTER — Other Ambulatory Visit: Payer: Self-pay | Admitting: *Deleted

## 2016-03-15 VITALS — BP 166/79 | HR 92 | Resp 20 | Ht 70.0 in | Wt 155.0 lb

## 2016-03-15 DIAGNOSIS — R911 Solitary pulmonary nodule: Secondary | ICD-10-CM

## 2016-03-15 NOTE — Progress Notes (Signed)
PCP is [name] Mons, PA-C Referring Provider is [name] Lark, MD  Chief Complaint  Patient presents with  . Lung Lesion    Surgical eval, Chest CT 03/01/16, PET Scan 09/09/15    HPI: 65 yo man sent for consultation re: LUL nodule  Mr. [name] [name] is a 65 yo man with a history of stage IV non-Hodgkin's B cell lymphoma, hypertension, stage III CKD, type II diabetes, hyperparathyroidism, and anemia. He has a remote history of ethanol abuse and tobacco abuse (1 ppd x 40 years, quit 7 years ago). He was diagnosed with B cell lymphoma in 5/16. He received 4 cycles of R CHOP. He is receiving maintenance chemo every other month. He had a complete response to treatment. On PET in 3/17 there was a small left upper lobe nodule that was hypermetabolic with an SUV of 6.2. It was felt to be inflammatory. A follow up CT recently showed a slight increase in size.  He has been feeling well. He denies CP, shortness of breath, fever, chills, sweats. His appetite is good and he has gained 5 pounds in the last 3 months.   Zubrod Score: At the time of surgery this patient's most appropriate activity status/level should be described as: '[]'$     0    Normal activity, no symptoms '[x]'$     1    Restricted in physical strenuous activity but ambulatory, able to do out light work '[]'$     2    Ambulatory and capable of self care, unable to do work activities, up and about >50 % of waking hours                              '[]'$     3    Only limited self care, in bed greater than 50% of waking hours '[]'$     4    Completely disabled, no self care, confined to bed or chair '[]'$     5    Moribund    Past Medical History:  Diagnosis Date  . Anemia   . Cancer ([place]) 2016   lymphoma  . Chronic kidney disease, stage III (moderate)   . Cramps, extremity    hx of in legs bilat; currently having issues in toes bilat   . Diabetes ([place])   . Erectile dysfunction   . Essential hypertension, benign   . History of ETOH abuse    quit 1999  .  History of tobacco abuse    quit 12/2008  . Hyperparathyroidism ([place])    secondary / renal per H&P with [facility] 07/17/2014   . Pneumonia   . Pulmonary infiltrate present on computed tomography 02/20/2015  . Shortness of breath dyspnea    exertion  . Type II or unspecified type diabetes mellitus with renal manifestations, not stated as uncontrolled     Past Surgical History:  Procedure Laterality Date  . ARM WOUND REPAIR / CLOSURE     chainsaw; left forearm/wrist  . AXILLARY LYMPH NODE BIOPSY Right 11/19/2014   Procedure: EXCISIONAL BIOPSY DEEP RIGHT AXILLARY LYMPH NODE ;  Surgeon: Fanny Skates, MD;  Location: [facility];  Service: General;  Laterality: Right;  . colonscopy     . PORTACATH PLACEMENT N/A 11/19/2014   Procedure: INSERTION PORT-A-CATH;  Surgeon: Fanny Skates, MD;  Location: [facility];  Service: General;  Laterality: N/A;    Family History  Problem Relation Age of Onset  . Cancer Father  colon  . Cancer Sister     Breast  . Stroke Brother   . Stroke Brother   . Lung disease Neg Hx   . Rheumatologic disease Neg Hx     Social History Social History  Substance Use Topics  . Smoking status: Former Smoker    Packs/day: 1.00    Years: 50.00    Types: Cigarettes    Quit date: 07/05/2005  . Smokeless tobacco: Never Used  . Alcohol use No     Comment: hx of ETOH use; quit drinking approx 2008     Current Outpatient Prescriptions  Medication Sig Dispense Refill  . amLODipine (NORVASC) 10 MG tablet TAKE ONE TABLET BY MOUTH ONCE DAILY 90 tablet 3  . aspirin 81 MG tablet Take 81 mg by mouth daily.    . fish oil-omega-3 fatty acids 1000 MG capsule Take 1 g by mouth daily.    Marland Kitchen glipiZIDE (GLUCOTROL XL) 10 MG 24 hr tablet TAKE ONE TABLET BY MOUTH ONCE DAILY 90 tablet 3  . glucose blood (ACCU-CHEK AVIVA PLUS) test strip Use as instructed 100 each 4  . hydrochlorothiazide (HYDRODIURIL) 25 MG tablet TAKE ONE TABLET BY MOUTH ONCE DAILY 90 tablet 3  . losartan (COZAAR)  100 MG tablet Take 1 tablet (100 mg total) by mouth daily. 90 tablet 3  . pravastatin (PRAVACHOL) 40 MG tablet TAKE ONE TABLET BY MOUTH ONCE DAILY 90 tablet 3  . sitaGLIPtin (JANUVIA) 50 MG tablet Take 1 tablet (50 mg total) by mouth daily. 30 tablet 12   No current facility-administered medications for this visit.     Allergies  Allergen Reactions  . Ace Inhibitors Hives  . Rituximab Other (See Comments)    This medication caused him to sweat & shake    Review of Systems  Constitutional: Positive for unexpected weight change (has gained 5 punds in 3 months). Negative for activity change, appetite change, chills and fever.  HENT: Positive for dental problem. Negative for trouble swallowing and voice change.   Eyes: Negative for visual disturbance.  Respiratory: Negative for cough, shortness of breath and wheezing.   Cardiovascular: Positive for leg swelling. Negative for chest pain and palpitations.  Gastrointestinal: Negative for abdominal pain and blood in stool.  Genitourinary: Negative for difficulty urinating and dysuria.       "kidney disease"  Musculoskeletal: Negative for arthralgias.       Leg cramps  Neurological: Negative for seizures, speech difficulty and weakness.  Hematological: Negative for adenopathy. Does not bruise/bleed easily.  All other systems reviewed and are negative.   BP (!) 166/79 (BP Location: Left Arm, Patient Position: Sitting, Cuff Size: Normal)   Pulse 92   Resp 20   Ht '5\' 10"'$  (1.778 m)   Wt 155 lb (70.3 kg)   SpO2 98% Comment: RA  BMI 22.24 kg/m  Physical Exam  Constitutional: He is oriented to person, place, and time. He appears well-developed and well-nourished. No distress.  HENT:  Head: Normocephalic and atraumatic.  Mouth/Throat: No oropharyngeal exudate.  Eyes: Conjunctivae and EOM are normal. No scleral icterus.  Neck: Neck supple. No thyromegaly present.  Cardiovascular: Normal rate, regular rhythm and normal heart sounds.  Exam  reveals no gallop and no friction rub.   No murmur heard. Pulmonary/Chest: Effort normal and breath sounds normal. No respiratory distress. He has no wheezes.  Abdominal: Soft. He exhibits no distension. There is no tenderness.  Musculoskeletal: Normal range of motion. He exhibits no edema.  Lymphadenopathy:  He has no cervical adenopathy.  Neurological: He is alert and oriented to person, place, and time. No cranial nerve deficit.  Skin: Skin is warm and dry.     Diagnostic Tests: CT CHEST WITHOUT CONTRAST  TECHNIQUE: Multidetector CT imaging of the chest was performed following the standard protocol without intravenous contrast. High resolution imaging of the lungs, as well as inspiratory and expiratory imaging, was performed.  COMPARISON:  09/09/2015 PET-CT.  03/21/2015 chest CT.  FINDINGS: Mediastinum/Nodes: Normal heart size. Stable small pericardial effusion/thickening. Atherosclerotic nonaneurysmal thoracic aorta. Normal caliber pulmonary arteries. Right subclavian MediPort terminates in the middle third of the superior vena cava. Heterogeneous thyroid gland without discrete thyroid nodule. Unremarkable esophagus. No pathologically enlarged axillary, mediastinal or gross hilar lymph nodes, noting limited sensitivity for the detection of hilar adenopathy on this noncontrast study. Right axillary surgical clips are again noted.  Lungs/Pleura: No pneumothorax. No pleural effusion. Moderate centrilobular and paraseptal emphysema with mild diffuse bronchial wall thickening. There is a subsolid 2.4 x 1.5 cm left upper lobe pulmonary nodule (series 8/ image 51), previously 2.2 x 1.4 cm on 09/09/2015 and 1.4 x 0.7 cm on 03/21/2015 using similar measurement technique, increased in size. Parenchymal bands at both lung bases are most consistent with scarring and/ or atelectasis. No acute consolidative airspace disease or additional significant pulmonary nodules. No  significant regions of subpleural reticulation, traction bronchiectasis or frank honeycombing. No significant air trapping on the expiration sequence.  Upper abdomen: Unremarkable.  Musculoskeletal: No aggressive appearing focal osseous lesions. Mild thoracic spondylosis.  IMPRESSION: 1. Mild interval growth of subsolid left upper lobe pulmonary nodule, which was hypermetabolic on 40/34/7425 PET-CT. A primary bronchogenic adenocarcinoma cannot be excluded. Thoracic surgical consultation is advised. 2. No lymphadenopathy or other findings of metastatic disease in the chest. 3. Moderate emphysema with mild diffuse bronchial wall thickening, suggesting COPD. 4. Aortic atherosclerosis. 5. Stable small pericardial effusion/thickening. These results will be called to the ordering clinician or representative by the Radiologist Assistant, and communication documented in the PACS or zVision Dashboard.   Electronically Signed   By: Ilona Sorrel M.D.   On: 03/01/2016 17:03  I personally reviewed the CT chest and compared it to the CT form 9/16 and the PET CT from 3/17  [name] is a 65 yo man with a history of tobacco abuse and stage IV B cell lymphoma who has a left upper lobe sub-solid nodule. The nodule was first noted on PET in March and was markedly hypermetabolic, more so than would be expected for a low grade adenocarcinoma. Over the past 6 months it has increased minimally in size.  I suspect the nodule is infectious (atypical) or inflammatory but cannot rule out the possibility of malignancy based on imaging. I discussed 3 options with Mr. Tallo. 1) continued radiographic follow up, 2) navigational bronchoscopy, 3) surgical resection. The nodule is relatively central and not amenable to a wedge resection. I do not think surgery is an appropriate 1st step in Mr. Kent's place. In my opinion the best option is navigational bronchoscopy for tissue sampling for path  and culture.  I discussed the general nature of the procedure, including the need for general anesthesia. He understands this would be done in the OR as an outpatient procedure. I informed him and his daughter of the indications, risks, benefits and alternatives. They understand the risks include those associated with general anesthesia. The risks include, but are not limited to death, stroke, MI, DVT/PE, bleeding, pneumothorax. They understand  there is a distinct possibility we will be unable to make a definitive diagnosis. They also are aware a negative biopsy does not rule the possibility of cancer.  He accepts the risks and agrees to proceed.  Plan:  Electromagnetic navigational bronchoscopy Wed 03/24/16  Melrose Nakayama, MD [facility] [phone]

## 2016-03-16 ENCOUNTER — Other Ambulatory Visit: Payer: Self-pay | Admitting: *Deleted

## 2016-03-16 DIAGNOSIS — R911 Solitary pulmonary nodule: Secondary | ICD-10-CM

## 2016-03-19 NOTE — Pre-Procedure Instructions (Addendum)
[name] [name]  03/19/2016      [id number]: [phone]: [phone] [place] [address] [place] [address]Phone: [phone]: [phone]   Your procedure is scheduled on Wednesday September 20.  [facility]  Call this number if you have problems the morning of surgery:  [phone]   Remember:  Do not eat food or drink liquids after midnight.  Take these medicines the morning of surgery with A SIP OF WATER: amlodipine (norvasc)  Do not take aspirin am of surgery unless instr to by dr  SBridgette [name](TODAY) taking any Aleve, Naproxen, Ibuprofen, Motrin, Advil, Goody's, BC's, all herbal medications, fish oil, and all vitamins  WHAT DO I DO ABOUT MY DIABETES MEDICATION?   .Marland KitchenDo not take oral diabetes medicines (pills) the morning of surgery. DO NOT TAKE glipizide (glucotrol) or sitagliptin (Januvia) the morning of surgery     How to Manage Your Diabetes Before and After Surgery  Why is it important to control my blood sugar before and after surgery? . Improving blood sugar levels before and after surgery helps healing and can limit problems. . A way of improving blood sugar control is eating a healthy diet by: o  Eating less sugar and carbohydrates o  Increasing activity/exercise o  Talking with your doctor about reaching your blood sugar goals . High blood sugars (greater than 180 mg/dL) can raise your risk of infections and slow your recovery, so you will need to focus on controlling your diabetes during the weeks before surgery. . Make sure that the doctor who takes care of your diabetes knows about your planned surgery including the date and location.  How do I manage my blood sugar before surgery? . Check your blood sugar at least 4  times a day, starting 2 days before surgery, to make sure that the level is not too high or low. o Check your blood sugar the morning of your surgery when you wake up and every 2 hours until you get to the Short Stay unit. . If your blood sugar is less than 70 mg/dL, you will need to treat for low blood sugar: o Do not take insulin. o Treat a low blood sugar (less than 70 mg/dL) with  cup of clear juice (cranberry or apple), 4 glucose tablets, OR glucose gel. o Recheck blood sugar in 15 minutes after treatment (to make sure it is greater than 70 mg/dL). If your blood sugar is not greater than 70 mg/dL on recheck, call [phone] further instructions. . Report your blood sugar to the short stay nurse when you get to Short Stay.  . If you are admitted to the hospital after surgery: o Your blood sugar will be checked by the staff and you will probably be given insulin after surgery (instead of oral diabetes medicines) to make sure you have good blood sugar levels. o The goal for blood sugar control after surgery is 80-180 mg/dL.    Do not wear jewelry, make-up or nail polish.  Do not wear lotions, powders, or perfumes, or deoderant.  Do not shave 48 hours prior to surgery.  Men may shave face and neck.  Do not bring valuables to the hospital.  [facility] responsible for any belongings or  valuables.  Contacts, dentures or bridgework may not be worn into surgery.  Leave your suitcase in the car.  After surgery it may be brought to your room.  For patients admitted to the hospital, discharge time will be determined by your treatment team.  Patients discharged the day of surgery will not be allowed to drive home.   Special instructions:    [facility]- Preparing For Surgery  Before surgery, you can play an important role. Because skin is not sterile, your skin needs to be as free of germs as possible. You can reduce the number of germs on your skin by washing with CHG  (chlorahexidine gluconate) Soap before surgery.  CHG is an antiseptic cleaner which kills germs and bonds with the skin to continue killing germs even after washing.  Please do not use if you have an allergy to CHG or antibacterial soaps. If your skin becomes reddened/irritated stop using the CHG.  Do not shave (including legs and underarms) for at least 48 hours prior to first CHG shower. It is OK to shave your face.  Please follow these instructions carefully.   1. Shower the NIGHT BEFORE SURGERY and the MORNING OF SURGERY with CHG.   2. If you chose to wash your hair, wash your hair first as usual with your normal shampoo.  3. After you shampoo, rinse your hair and body thoroughly to remove the shampoo.  4. Use CHG as you would any other liquid soap. You can apply CHG directly to the skin and wash gently with a scrungie or a clean washcloth.   5. Apply the CHG Soap to your body ONLY FROM THE NECK DOWN.  Do not use on open wounds or open sores. Avoid contact with your eyes, ears, mouth and genitals (private parts). Wash genitals (private parts) with your normal soap.  6. Wash thoroughly, paying special attention to the area where your surgery will be performed.  7. Thoroughly rinse your body with warm water from the neck down.  8. DO NOT shower/wash with your normal soap after using and rinsing off the CHG Soap.  9. Pat yourself dry with a CLEAN TOWEL.   10. Wear CLEAN PAJAMAS   11. Place CLEAN SHEETS on your bed the night of your first shower and DO NOT SLEEP WITH PETS.    Day of Surgery: Do not apply any deodorants/lotions. Please wear clean clothes to the hospital/surgery center.      Please read over the fact sheets that you were given.

## 2016-03-22 ENCOUNTER — Ambulatory Visit (HOSPITAL_COMMUNITY)
Admission: RE | Admit: 2016-03-22 | Discharge: 2016-03-22 | Disposition: A | Discharge status: Home / Self Care | Payer: Medicare Other | Source: Ambulatory Visit | Attending: Thoracic Surgery (Cardiothoracic Vascular Surgery) | Admitting: Thoracic Surgery (Cardiothoracic Vascular Surgery)

## 2016-03-22 ENCOUNTER — Encounter (HOSPITAL_COMMUNITY)
Admission: RE | Admit: 2016-03-22 | Discharge: 2016-03-22 | Disposition: A | Discharge status: Home / Self Care | Payer: Medicare Other | Source: Ambulatory Visit | Attending: Thoracic Surgery (Cardiothoracic Vascular Surgery) | Admitting: Thoracic Surgery (Cardiothoracic Vascular Surgery)

## 2016-03-22 ENCOUNTER — Encounter (HOSPITAL_COMMUNITY): Payer: Self-pay

## 2016-03-22 DIAGNOSIS — Z01812 Encounter for preprocedural laboratory examination: Secondary | ICD-10-CM | POA: Diagnosis not present

## 2016-03-22 DIAGNOSIS — N529 Male erectile dysfunction, unspecified: Secondary | ICD-10-CM | POA: Insufficient documentation

## 2016-03-22 DIAGNOSIS — E1122 Type 2 diabetes mellitus with diabetic chronic kidney disease: Secondary | ICD-10-CM | POA: Insufficient documentation

## 2016-03-22 DIAGNOSIS — Z01818 Encounter for other preprocedural examination: Secondary | ICD-10-CM | POA: Diagnosis present

## 2016-03-22 DIAGNOSIS — F1010 Alcohol abuse, uncomplicated: Secondary | ICD-10-CM | POA: Diagnosis not present

## 2016-03-22 DIAGNOSIS — Z01810 Encounter for preprocedural cardiovascular examination: Secondary | ICD-10-CM | POA: Diagnosis not present

## 2016-03-22 DIAGNOSIS — R911 Solitary pulmonary nodule: Secondary | ICD-10-CM

## 2016-03-22 DIAGNOSIS — D649 Anemia, unspecified: Secondary | ICD-10-CM | POA: Insufficient documentation

## 2016-03-22 DIAGNOSIS — C8510 Unspecified B-cell lymphoma, unspecified site: Secondary | ICD-10-CM | POA: Diagnosis not present

## 2016-03-22 DIAGNOSIS — N2581 Secondary hyperparathyroidism of renal origin: Secondary | ICD-10-CM | POA: Insufficient documentation

## 2016-03-22 DIAGNOSIS — N183 Chronic kidney disease, stage 3 (moderate): Secondary | ICD-10-CM | POA: Diagnosis not present

## 2016-03-22 DIAGNOSIS — I10 Essential (primary) hypertension: Secondary | ICD-10-CM | POA: Diagnosis not present

## 2016-03-22 DIAGNOSIS — I129 Hypertensive chronic kidney disease with stage 1 through stage 4 chronic kidney disease, or unspecified chronic kidney disease: Secondary | ICD-10-CM | POA: Insufficient documentation

## 2016-03-22 LAB — GLUCOSE, CAPILLARY: GLUCOSE-CAPILLARY: 228 mg/dL — AB (ref 65–99)

## 2016-03-22 LAB — PROTIME-INR
INR: 0.99
PROTHROMBIN TIME: 13 s (ref 11.4–15.2)

## 2016-03-22 LAB — COMPREHENSIVE METABOLIC PANEL
ALBUMIN: 4.1 g/dL (ref 3.5–5.0)
ALK PHOS: 64 U/L (ref 38–126)
ALT: 27 U/L (ref 17–63)
ANION GAP: 9 (ref 5–15)
AST: 26 U/L (ref 15–41)
BILIRUBIN TOTAL: 0.5 mg/dL (ref 0.3–1.2)
BUN: 28 mg/dL — AB (ref 6–20)
CALCIUM: 9.2 mg/dL (ref 8.9–10.3)
CO2: 25 mmol/L (ref 22–32)
Chloride: 103 mmol/L (ref 101–111)
Creatinine, Ser: 2.16 mg/dL — ABNORMAL HIGH (ref 0.61–1.24)
GFR calc Af Amer: 35 mL/min — ABNORMAL LOW (ref >60)
GFR, EST NON AFRICAN AMERICAN: 30 mL/min — AB (ref >60)
GLUCOSE: 223 mg/dL — AB (ref 65–99)
POTASSIUM: 3.9 mmol/L (ref 3.5–5.1)
Sodium: 137 mmol/L (ref 135–145)
TOTAL PROTEIN: 6.7 g/dL (ref 6.5–8.1)

## 2016-03-22 LAB — CBC
HEMATOCRIT: 40.7 % (ref 39.0–52.0)
HEMOGLOBIN: 13.4 g/dL (ref 13.0–17.0)
MCH: 28.1 pg (ref 26.0–34.0)
MCHC: 32.9 g/dL (ref 30.0–36.0)
MCV: 85.3 fL (ref 78.0–100.0)
Platelets: 203 10*3/uL (ref 150–400)
RBC: 4.77 MIL/uL (ref 4.22–5.81)
RDW: 12.9 % (ref 11.5–15.5)
WBC: 10.5 10*3/uL (ref 4.0–10.5)

## 2016-03-22 LAB — APTT: APTT: 31 s (ref 24–36)

## 2016-03-23 ENCOUNTER — Other Ambulatory Visit: Payer: Medicare Other

## 2016-03-23 ENCOUNTER — Ambulatory Visit
Admission: RE | Admit: 2016-03-23 | Discharge: 2016-03-23 | Disposition: A | Discharge status: Home / Self Care | Payer: Medicare Other | Source: Ambulatory Visit | Attending: Thoracic Surgery (Cardiothoracic Vascular Surgery) | Admitting: Thoracic Surgery (Cardiothoracic Vascular Surgery)

## 2016-03-23 DIAGNOSIS — R911 Solitary pulmonary nodule: Secondary | ICD-10-CM

## 2016-03-23 NOTE — Progress Notes (Signed)
Anesthesia chart review: Patient is a 65 year old male scheduled for video bronchoscopy with endobronchial navigation on 03/24/2016 by Dr. Roxan [name].  History includes left upper lobe lung nodule, stage IV non-Hodgkin's B-cell lymphoma 5/20016 s/p 4 cycles of R CHOP, former smoker, hypertension, diabetes mellitus type 2, chronic kidney disease stage III, secondary hyperparathyroidism, EtOH abuse (sober since '99), ED, exertional dyspnea, anemia.  PCP is [name] Mons, PA-C. HEM-ONC is Dr. Alvy [name]. Nephrologist is Dr. [name]. Last visit 02/27/15. She writes that Cr had been in the 2.3 range at that time.  He saw pulmonologist Dr. [name] in 2016.  Meds include amlodipine, aspirin 81 mg, fish oil omega-3 fatty acids, glipizide, HCTZ, losartan, pravastatin, Januvia.  BP (!) 158/68   Pulse 78   Temp 36.6 C   Resp 20   Ht '5\' 10"'$  (1.778 m)   Wt 168 lb 4.8 oz (76.3 kg)   SpO2 99%   BMI 24.15 kg/m   03/22/16 EKG: NSR.  11/27/14 Echo: Study Conclusions - Left ventricle: Strain parameters are normal:   Global longitudinal strain: - 18.8%   Lateral S prime: 12 cm/sec. The cavity size was normal. Systolic   function was normal. The estimated ejection fraction was in the   range of 55% to 60%. Wall motion was normal; there were no   regional wall motion abnormalities. Doppler parameters are   consistent with abnormal left ventricular relaxation (grade 1   diastolic dysfunction). There was no evidence of elevated   ventricular filling pressure by Doppler parameters. - Aortic valve: There was trivial regurgitation. - Aortic root: The aortic root was normal in size. - Mitral valve: Structurally normal valve. There was mild   regurgitation. - Right ventricle: Systolic function was normal. - Right atrium: The atrium was normal in size. - Tricuspid valve: There was mild regurgitation. - Pulmonic valve: There was no regurgitation. - Pericardium, extracardiac: Features were not  consistent with   tamponade physiology. Impressions: - Normal biventricular size and function. Impaired relaxation.   Normal strain parameters.  11/08/07 Nuclear stress test (Dr. [name]): Low risk stress nuclear study. There is decreased perfusion and thinning of the inferior wall at both rest and stress with a very mild reversible defect in the inferior basal wall. Given the normal wall motion I suspect this is diaphragmatic attenuation but cannot rule out underlying CAD. This was not significantly changed since 05/2003.   03/22/16 CXR: FINDINGS: There is a right chest wall port a catheter with tip in the projection of the SVC. Normal heart size. No pleural effusion or edema. Decreased lung volumes. No airspace opacities. IMPRESSION: 1. No acute cardiopulmonary abnormalities.  03/01/16 CT Chest high resolution: IMPRESSION: 1. Mild interval growth of subsolid left upper lobe pulmonary nodule, which was hypermetabolic on 17/49/4496 PET-CT. A primary bronchogenic adenocarcinoma cannot be excluded. Thoracic surgical consultation is advised. 2. No lymphadenopathy or other findings of metastatic disease in the chest. 3. Moderate emphysema with mild diffuse bronchial wall thickening, suggesting COPD. 4. Aortic atherosclerosis. 5. Stable small pericardial effusion/thickening.  Preoperative labs noted. BUN 28, creatinine 2.16, glucose 223. CBC, PT, PTT within normal limits. A1c6.8 on 01/27/16. Overall, his renal function appears stable. (According to renal notes and labs in Epic, he has been ~ 1.7-2.3 since 2010.)  If no acute changes then I anticipate that he can proceed as planned.  [name] [name] [facility] Short Stay Center/Anesthesiology Phone [phone] 03/23/2016 10:54 AM

## 2016-03-24 ENCOUNTER — Encounter (HOSPITAL_COMMUNITY)
Admission: RE | Disposition: A | Discharge status: Home / Self Care | Payer: Self-pay | Source: Ambulatory Visit | Attending: Thoracic Surgery (Cardiothoracic Vascular Surgery)

## 2016-03-24 ENCOUNTER — Ambulatory Visit (HOSPITAL_COMMUNITY)
Admission: RE | Admit: 2016-03-24 | Discharge: 2016-03-24 | Disposition: A | Discharge status: Home / Self Care | Payer: Medicare Other | Source: Ambulatory Visit | Attending: Thoracic Surgery (Cardiothoracic Vascular Surgery) | Admitting: Thoracic Surgery (Cardiothoracic Vascular Surgery)

## 2016-03-24 ENCOUNTER — Ambulatory Visit (HOSPITAL_COMMUNITY): Payer: Medicare Other

## 2016-03-24 ENCOUNTER — Ambulatory Visit (HOSPITAL_COMMUNITY): Payer: Medicare Other | Admitting: Vascular Surgery

## 2016-03-24 ENCOUNTER — Ambulatory Visit (HOSPITAL_COMMUNITY): Payer: Medicare Other | Admitting: Anesthesiology

## 2016-03-24 ENCOUNTER — Encounter (HOSPITAL_COMMUNITY): Payer: Self-pay | Admitting: Urology

## 2016-03-24 DIAGNOSIS — Z800 Family history of malignant neoplasm of digestive organs: Secondary | ICD-10-CM | POA: Diagnosis not present

## 2016-03-24 DIAGNOSIS — Z8572 Personal history of non-Hodgkin lymphomas: Secondary | ICD-10-CM | POA: Insufficient documentation

## 2016-03-24 DIAGNOSIS — Z87891 Personal history of nicotine dependence: Secondary | ICD-10-CM | POA: Diagnosis not present

## 2016-03-24 DIAGNOSIS — C3412 Malignant neoplasm of upper lobe, left bronchus or lung: Secondary | ICD-10-CM | POA: Insufficient documentation

## 2016-03-24 DIAGNOSIS — N183 Chronic kidney disease, stage 3 (moderate): Secondary | ICD-10-CM | POA: Diagnosis not present

## 2016-03-24 DIAGNOSIS — Z803 Family history of malignant neoplasm of breast: Secondary | ICD-10-CM | POA: Insufficient documentation

## 2016-03-24 DIAGNOSIS — Z7982 Long term (current) use of aspirin: Secondary | ICD-10-CM | POA: Diagnosis not present

## 2016-03-24 DIAGNOSIS — I700 Atherosclerosis of aorta: Secondary | ICD-10-CM | POA: Diagnosis not present

## 2016-03-24 DIAGNOSIS — D631 Anemia in chronic kidney disease: Secondary | ICD-10-CM | POA: Diagnosis not present

## 2016-03-24 DIAGNOSIS — J439 Emphysema, unspecified: Secondary | ICD-10-CM | POA: Diagnosis present

## 2016-03-24 DIAGNOSIS — I129 Hypertensive chronic kidney disease with stage 1 through stage 4 chronic kidney disease, or unspecified chronic kidney disease: Secondary | ICD-10-CM | POA: Insufficient documentation

## 2016-03-24 DIAGNOSIS — E1122 Type 2 diabetes mellitus with diabetic chronic kidney disease: Secondary | ICD-10-CM | POA: Diagnosis not present

## 2016-03-24 DIAGNOSIS — I313 Pericardial effusion (noninflammatory): Secondary | ICD-10-CM | POA: Diagnosis not present

## 2016-03-24 DIAGNOSIS — E213 Hyperparathyroidism, unspecified: Secondary | ICD-10-CM | POA: Diagnosis not present

## 2016-03-24 DIAGNOSIS — Z79899 Other long term (current) drug therapy: Secondary | ICD-10-CM | POA: Diagnosis not present

## 2016-03-24 DIAGNOSIS — R911 Solitary pulmonary nodule: Secondary | ICD-10-CM

## 2016-03-24 DIAGNOSIS — Z823 Family history of stroke: Secondary | ICD-10-CM | POA: Insufficient documentation

## 2016-03-24 DIAGNOSIS — Z419 Encounter for procedure for purposes other than remedying health state, unspecified: Secondary | ICD-10-CM

## 2016-03-24 HISTORY — PX: VIDEO BRONCHOSCOPY WITH ENDOBRONCHIAL NAVIGATION: SHX6175

## 2016-03-24 LAB — GLUCOSE, CAPILLARY
Glucose-Capillary: 158 mg/dL — ABNORMAL HIGH (ref 65–99)
Glucose-Capillary: 147 mg/dL — ABNORMAL HIGH (ref 65–99)
Glucose-Capillary: 89 mg/dL (ref 65–99)

## 2016-03-24 SURGERY — VIDEO BRONCHOSCOPY WITH ENDOBRONCHIAL NAVIGATION
Anesthesia: General | Laterality: Left

## 2016-03-24 MED ORDER — MIDAZOLAM HCL 5 MG/5ML IJ SOLN
INTRAMUSCULAR | Status: DC | PRN
  Administered 2016-03-24: 2 mg via INTRAVENOUS

## 2016-03-24 MED ORDER — LACTATED RINGERS IV SOLN: INTRAVENOUS | Status: DC | PRN

## 2016-03-24 MED ORDER — EPINEPHRINE HCL 1 MG/ML IJ SOLN
INTRAMUSCULAR | Status: AC
  Filled 2016-03-24: qty 1

## 2016-03-24 MED ORDER — SUGAMMADEX SODIUM 500 MG/5ML IV SOLN
INTRAVENOUS | Status: DC | PRN
  Administered 2016-03-24: 400 mg via INTRAVENOUS

## 2016-03-24 MED ORDER — PHENYLEPHRINE HCL 10 MG/ML IJ SOLN
INTRAVENOUS | Status: DC | PRN
  Administered 2016-03-24: 20 ug/min via INTRAVENOUS

## 2016-03-24 MED ORDER — ROCURONIUM BROMIDE 100 MG/10ML IV SOLN
INTRAVENOUS | Status: DC | PRN
  Administered 2016-03-24: 50 mg via INTRAVENOUS

## 2016-03-24 MED ORDER — MIDAZOLAM HCL 2 MG/2ML IJ SOLN
INTRAMUSCULAR | Status: AC
  Filled 2016-03-24: qty 2

## 2016-03-24 MED ORDER — FENTANYL CITRATE (PF) 100 MCG/2ML IJ SOLN
INTRAMUSCULAR | Status: DC | PRN
  Administered 2016-03-24: 50 ug via INTRAVENOUS
  Administered 2016-03-24: 100 ug via INTRAVENOUS
  Administered 2016-03-24: 50 ug via INTRAVENOUS

## 2016-03-24 MED ORDER — SODIUM CHLORIDE 0.9 % IV SOLN
INTRAVENOUS | Status: DC
  Administered 2016-03-24 (×2): via INTRAVENOUS

## 2016-03-24 MED ORDER — PROPOFOL 10 MG/ML IV BOLUS
INTRAVENOUS | Status: AC
  Filled 2016-03-24: qty 20

## 2016-03-24 MED ORDER — PROPOFOL 10 MG/ML IV BOLUS
INTRAVENOUS | Status: DC | PRN
  Administered 2016-03-24: 125 mg via INTRAVENOUS

## 2016-03-24 MED ORDER — FENTANYL CITRATE (PF) 100 MCG/2ML IJ SOLN
INTRAMUSCULAR | Status: AC
  Filled 2016-03-24: qty 4

## 2016-03-24 MED ORDER — 0.9 % SODIUM CHLORIDE (POUR BTL) OPTIME
TOPICAL | Status: DC | PRN
  Administered 2016-03-24: 1000 mL

## 2016-03-24 MED ORDER — LIDOCAINE HCL (CARDIAC) 20 MG/ML IV SOLN
INTRAVENOUS | Status: DC | PRN
  Administered 2016-03-24: 50 mg via INTRAVENOUS

## 2016-03-24 SURGICAL SUPPLY — 38 items
ADAPTER BRONCH F/PENTAX (ADAPTER) ×3 IMPLANT
ADPR BSCP EDG PNTX (ADAPTER) ×2
BRUSH SUPERTRAX BIOPSY (INSTRUMENTS) ×1 IMPLANT
BRUSH SUPERTRAX NDL-TIP CYTO (INSTRUMENTS) ×1 IMPLANT
CANISTER SUCTION 2500CC (MISCELLANEOUS) ×2 IMPLANT
CHANNEL WORK EXTEND EDGE 180 (KITS) IMPLANT
CHANNEL WORK EXTEND EDGE 45 (KITS) IMPLANT
CHANNEL WORK EXTEND EDGE 90 (KITS) IMPLANT
CONT SPEC 4OZ CLIKSEAL STRL BL (MISCELLANEOUS) ×5 IMPLANT
COVER TABLE BACK 60X90 (DRAPES) ×2 IMPLANT
FILTER STRAW FLUID ASPIR (MISCELLANEOUS) IMPLANT
FORCEPS BIOP SUPERTRX PREMAR (INSTRUMENTS) ×1 IMPLANT
GAUZE SPONGE 4X4 12PLY STRL (GAUZE/BANDAGES/DRESSINGS) ×2 IMPLANT
GLOVE SURG SIGNA 7.5 PF LTX (GLOVE) ×4 IMPLANT
GOWN STRL REUS W/ TWL XL LVL3 (GOWN DISPOSABLE) ×1 IMPLANT
GOWN STRL REUS W/TWL XL LVL3 (GOWN DISPOSABLE) ×2
KIT CLEAN ENDO COMPLIANCE (KITS) ×2 IMPLANT
KIT PROCEDURE EDGE 180 (KITS) ×1 IMPLANT
KIT PROCEDURE EDGE 45 (KITS) IMPLANT
KIT PROCEDURE EDGE 90 (KITS) IMPLANT
KIT ROOM TURNOVER OR (KITS) ×2 IMPLANT
MARKER SKIN DUAL TIP RULER LAB (MISCELLANEOUS) ×2 IMPLANT
NDL SUPERTRX PREMARK BIOPSY (NEEDLE) IMPLANT
NEEDLE SUPERTRX PREMARK BIOPSY (NEEDLE) ×2 IMPLANT
NS IRRIG 1000ML POUR BTL (IV SOLUTION) ×2 IMPLANT
OIL SILICONE PENTAX (PARTS (SERVICE/REPAIRS)) ×2 IMPLANT
PAD ARMBOARD 7.5X6 YLW CONV (MISCELLANEOUS) ×4 IMPLANT
PATCHES PATIENT (LABEL) ×6 IMPLANT
SYR 20CC LL (SYRINGE) ×2 IMPLANT
SYR 20ML ECCENTRIC (SYRINGE) ×2 IMPLANT
SYR 30ML LL (SYRINGE) ×2 IMPLANT
SYR 5ML LL (SYRINGE) ×2 IMPLANT
SYRINGE 60CC LL (MISCELLANEOUS) ×1 IMPLANT
TOWEL OR 17X24 6PK STRL BLUE (TOWEL DISPOSABLE) ×2 IMPLANT
TRAP SPECIMEN MUCOUS 40CC (MISCELLANEOUS) ×2 IMPLANT
TUBE CONNECTING 20X1/4 (TUBING) ×4 IMPLANT
UNDERPAD 30X30 (UNDERPADS AND DIAPERS) ×2 IMPLANT
WATER STERILE IRR 1000ML POUR (IV SOLUTION) ×2 IMPLANT

## 2016-03-24 NOTE — Brief Op Note (Signed)
03/24/2016  2:04 PM  PATIENT:  Cheryle Horsfall  65 y.o. male  PRE-OPERATIVE DIAGNOSIS:  Left upper lobe nodule  POST-OPERATIVE DIAGNOSIS:  Left upper lobe nodule  PROCEDURE:  Procedure(s): VIDEO BRONCHOSCOPY WITH ENDOBRONCHIAL NAVIGATION (Left)- needle aspirations, brushings and biopsies  SURGEON:  Surgeon(s) and Role:    * Melrose Nakayama, MD - Primary  ANESTHESIA:   general  EBL:  Total I/O In: 73 [I.V.:800] Out: 1 [Blood:1]  BLOOD ADMINISTERED:none  DRAINS: none   LOCAL MEDICATIONS USED:  NONE  SPECIMEN:  Source of Specimen:  Left upper lobe nodule  DISPOSITION OF SPECIMEN:  Path and micro (AFB, fungal)  PLAN OF CARE: Discharge to home after PACU  PATIENT DISPOSITION:  PACU - hemodynamically stable.   Delay start of Pharmacological VTE agent (>24hrs) due to surgical blood loss or risk of bleeding: not applicable

## 2016-03-24 NOTE — Progress Notes (Signed)
Pt has port for chemotherapy treatment, port is not accessed at this time.

## 2016-03-24 NOTE — Anesthesia Postprocedure Evaluation (Signed)
Anesthesia Post Note  Patient: [name] [name]  Procedure(s) Performed: Procedure(s) (LRB): VIDEO BRONCHOSCOPY WITH ENDOBRONCHIAL NAVIGATION (Left)  Patient location during evaluation: PACU Anesthesia Type: General Level of consciousness: awake and alert Pain management: pain level controlled Vital Signs Assessment: post-procedure vital signs reviewed and stable Respiratory status: spontaneous breathing, nonlabored ventilation and respiratory function stable Cardiovascular status: blood pressure returned to baseline and stable Postop Assessment: no signs of nausea or vomiting Anesthetic complications: no    Last Vitals:  Vitals:   03/24/16 1424 03/24/16 1433  BP: (!) 118/52 136/64  Pulse: 72 76  Resp: 18 16  Temp:      Last Pain:  Vitals:   03/24/16 0851  TempSrc: Oral                 [name],W. EDMOND

## 2016-03-24 NOTE — Anesthesia Preprocedure Evaluation (Addendum)
Anesthesia Evaluation  Patient identified by MRN, date of birth, ID band Patient awake    Reviewed: Allergy & Precautions, H&P , NPO status , Patient's Chart, lab work & pertinent test results  Airway Mallampati: II  TM Distance: >3 FB Neck ROM: Full    Dental no notable dental hx. (+) Loose, Dental Advisory Given   Pulmonary former smoker,    Pulmonary exam normal breath sounds clear to auscultation       Cardiovascular hypertension, Pt. on medications  Rhythm:Regular Rate:Normal     Neuro/Psych negative neurological ROS  negative psych ROS   GI/Hepatic negative GI ROS, Neg liver ROS,   Endo/Other  diabetes, Type 2, Oral Hypoglycemic Agents  Renal/GU Renal InsufficiencyRenal disease  negative genitourinary   Musculoskeletal   Abdominal   Peds  Hematology negative hematology ROS (+)   Anesthesia Other Findings   Reproductive/Obstetrics negative OB ROS                            Anesthesia Physical Anesthesia Plan  ASA: III  Anesthesia Plan: General   Post-op Pain Management:    Induction: Intravenous  Airway Management Planned: Oral ETT  Additional Equipment:   Intra-op Plan:   Post-operative Plan: Extubation in OR  Informed Consent: I have reviewed the patients History and Physical, chart, labs and discussed the procedure including the risks, benefits and alternatives for the proposed anesthesia with the patient or authorized representative who has indicated his/her understanding and acceptance.   Dental advisory given  Plan Discussed with: CRNA  Anesthesia Plan Comments:         Anesthesia Quick Evaluation

## 2016-03-24 NOTE — Anesthesia Procedure Notes (Signed)
Procedure Name: Intubation Date/Time: 03/24/2016 12:55 PM Performed by: Laretta Alstrom Pre-anesthesia Checklist: Timeout performed, Suction available, Patient being monitored, Emergency Drugs available and Patient identified Patient Re-evaluated:Patient Re-evaluated prior to inductionOxygen Delivery Method: Circle system utilized Preoxygenation: Pre-oxygenation with 100% oxygen Intubation Type: IV induction Ventilation: Mask ventilation without difficulty Laryngoscope Size: Mac and 4 Grade View: Grade II Tube type: Oral Tube size: 9.0 mm Number of attempts: 2 Airway Equipment and Method: Bougie stylet and Stylet Placement Confirmation: ETT inserted through vocal cords under direct vision,  positive ETCO2 and breath sounds checked- equal and bilateral Secured at: 24 (overbite) cm Tube secured with: Tape Dental Injury: Teeth and Oropharynx as per pre-operative assessment

## 2016-03-24 NOTE — Interval H&P Note (Signed)
History and Physical Interval Note:  03/24/2016 12:00 PM  [name] [name]  has presented today for surgery, with the diagnosis of LUL NODULE  The various methods of treatment have been discussed with the patient and family. After consideration of risks, benefits and other options for treatment, the patient has consented to  Procedure(s): [facility] (N/A) as a surgical intervention .  The patient's history has been reviewed, patient examined, no change in status, stable for surgery.  I have reviewed the patient's chart and labs.  Questions were answered to the patient's satisfaction.     Melrose Nakayama

## 2016-03-24 NOTE — Transfer of Care (Signed)
Immediate Anesthesia Transfer of Care Note  Patient: [name] [name]  Procedure(s) Performed: Procedure(s): VIDEO BRONCHOSCOPY WITH ENDOBRONCHIAL NAVIGATION (Left)  Patient Location: PACU  Anesthesia Type:General  Level of Consciousness: awake, alert  and oriented  Airway & Oxygen Therapy: Patient Spontanous Breathing and Patient connected to nasal cannula oxygen  Post-op Assessment: Report given to RN, Post -op Vital signs reviewed and stable and Patient moving all extremities X 4  Post vital signs: Reviewed and stable  Last Vitals:  Vitals:   03/24/16 0851  BP: (!) 160/67  Pulse: 83  Resp: 20  Temp: 37.1 C    Last Pain:  Vitals:   03/24/16 0851  TempSrc: Oral         Complications: No apparent anesthesia complications

## 2016-03-24 NOTE — H&P (View-Only) (Signed)
PCP is Harrison Mons, PA-C Referring Provider is Heath Lark, MD  Chief Complaint  Patient presents with  . Lung Lesion    Surgical eval, Chest CT 03/01/16, PET Scan 09/09/15    HPI: 65 yo man sent for consultation re: LUL nodule  Mr. Civello is a 65 yo man with a history of stage IV non-Hodgkin's B cell lymphoma, hypertension, stage III CKD, type II diabetes, hyperparathyroidism, and anemia. He has a remote history of ethanol abuse and tobacco abuse (1 ppd x 40 years, quit 7 years ago). He was diagnosed with B cell lymphoma in 5/16. He received 4 cycles of R CHOP. He is receiving maintenance chemo every other month. He had a complete response to treatment. On PET in 3/17 there was a small left upper lobe nodule that was hypermetabolic with an SUV of 6.2. It was felt to be inflammatory. A follow up CT recently showed a slight increase in size.  He has been feeling well. He denies CP, shortness of breath, fever, chills, sweats. His appetite is good and he has gained 5 pounds in the last 3 months.   Zubrod Score: At the time of surgery this patient's most appropriate activity status/level should be described as: '[]'$     0    Normal activity, no symptoms '[x]'$     1    Restricted in physical strenuous activity but ambulatory, able to do out light work '[]'$     2    Ambulatory and capable of self care, unable to do work activities, up and about >50 % of waking hours                              '[]'$     3    Only limited self care, in bed greater than 50% of waking hours '[]'$     4    Completely disabled, no self care, confined to bed or chair '[]'$     5    Moribund    Past Medical History:  Diagnosis Date  . Anemia   . Cancer ([place]) 2016   lymphoma  . Chronic kidney disease, stage III (moderate)   . Cramps, extremity    hx of in legs bilat; currently having issues in toes bilat   . Diabetes ([place])   . Erectile dysfunction   . Essential hypertension, benign   . History of ETOH abuse    quit 1999  .  History of tobacco abuse    quit 12/2008  . Hyperparathyroidism ([place])    secondary / renal per H&P with [facility] 07/17/2014   . Pneumonia   . Pulmonary infiltrate present on computed tomography 02/20/2015  . Shortness of breath dyspnea    exertion  . Type II or unspecified type diabetes mellitus with renal manifestations, not stated as uncontrolled     Past Surgical History:  Procedure Laterality Date  . ARM WOUND REPAIR / CLOSURE     chainsaw; left forearm/wrist  . AXILLARY LYMPH NODE BIOPSY Right 11/19/2014   Procedure: EXCISIONAL BIOPSY DEEP RIGHT AXILLARY LYMPH NODE ;  Surgeon: Fanny Skates, MD;  Location: [facility];  Service: General;  Laterality: Right;  . colonscopy     . PORTACATH PLACEMENT N/A 11/19/2014   Procedure: INSERTION PORT-A-CATH;  Surgeon: Fanny Skates, MD;  Location: [facility];  Service: General;  Laterality: N/A;    Family History  Problem Relation Age of Onset  . Cancer Father  colon  . Cancer Sister     Breast  . Stroke Brother   . Stroke Brother   . Lung disease Neg Hx   . Rheumatologic disease Neg Hx     Social History Social History  Substance Use Topics  . Smoking status: Former Smoker    Packs/day: 1.00    Years: 50.00    Types: Cigarettes    Quit date: 07/05/2005  . Smokeless tobacco: Never Used  . Alcohol use No     Comment: hx of ETOH use; quit drinking approx 2008     Current Outpatient Prescriptions  Medication Sig Dispense Refill  . amLODipine (NORVASC) 10 MG tablet TAKE ONE TABLET BY MOUTH ONCE DAILY 90 tablet 3  . aspirin 81 MG tablet Take 81 mg by mouth daily.    . fish oil-omega-3 fatty acids 1000 MG capsule Take 1 g by mouth daily.    Marland Kitchen glipiZIDE (GLUCOTROL XL) 10 MG 24 hr tablet TAKE ONE TABLET BY MOUTH ONCE DAILY 90 tablet 3  . glucose blood (ACCU-CHEK AVIVA PLUS) test strip Use as instructed 100 each 4  . hydrochlorothiazide (HYDRODIURIL) 25 MG tablet TAKE ONE TABLET BY MOUTH ONCE DAILY 90 tablet 3  . losartan (COZAAR)  100 MG tablet Take 1 tablet (100 mg total) by mouth daily. 90 tablet 3  . pravastatin (PRAVACHOL) 40 MG tablet TAKE ONE TABLET BY MOUTH ONCE DAILY 90 tablet 3  . sitaGLIPtin (JANUVIA) 50 MG tablet Take 1 tablet (50 mg total) by mouth daily. 30 tablet 12   No current facility-administered medications for this visit.     Allergies  Allergen Reactions  . Ace Inhibitors Hives  . Rituximab Other (See Comments)    This medication caused him to sweat & shake    Review of Systems  Constitutional: Positive for unexpected weight change (has gained 5 punds in 3 months). Negative for activity change, appetite change, chills and fever.  HENT: Positive for dental problem. Negative for trouble swallowing and voice change.   Eyes: Negative for visual disturbance.  Respiratory: Negative for cough, shortness of breath and wheezing.   Cardiovascular: Positive for leg swelling. Negative for chest pain and palpitations.  Gastrointestinal: Negative for abdominal pain and blood in stool.  Genitourinary: Negative for difficulty urinating and dysuria.       "kidney disease"  Musculoskeletal: Negative for arthralgias.       Leg cramps  Neurological: Negative for seizures, speech difficulty and weakness.  Hematological: Negative for adenopathy. Does not bruise/bleed easily.  All other systems reviewed and are negative.   BP (!) 166/79 (BP Location: Left Arm, Patient Position: Sitting, Cuff Size: Normal)   Pulse 92   Resp 20   Ht '5\' 10"'$  (1.778 m)   Wt 155 lb (70.3 kg)   SpO2 98% Comment: RA  BMI 22.24 kg/m  Physical Exam  Constitutional: He is oriented to person, place, and time. He appears well-developed and well-nourished. No distress.  HENT:  Head: Normocephalic and atraumatic.  Mouth/Throat: No oropharyngeal exudate.  Eyes: Conjunctivae and EOM are normal. No scleral icterus.  Neck: Neck supple. No thyromegaly present.  Cardiovascular: Normal rate, regular rhythm and normal heart sounds.  Exam  reveals no gallop and no friction rub.   No murmur heard. Pulmonary/Chest: Effort normal and breath sounds normal. No respiratory distress. He has no wheezes.  Abdominal: Soft. He exhibits no distension. There is no tenderness.  Musculoskeletal: Normal range of motion. He exhibits no edema.  Lymphadenopathy:  He has no cervical adenopathy.  Neurological: He is alert and oriented to person, place, and time. No cranial nerve deficit.  Skin: Skin is warm and dry.     Diagnostic Tests: CT CHEST WITHOUT CONTRAST  TECHNIQUE: Multidetector CT imaging of the chest was performed following the standard protocol without intravenous contrast. High resolution imaging of the lungs, as well as inspiratory and expiratory imaging, was performed.  COMPARISON:  09/09/2015 PET-CT.  03/21/2015 chest CT.  FINDINGS: Mediastinum/Nodes: Normal heart size. Stable small pericardial effusion/thickening. Atherosclerotic nonaneurysmal thoracic aorta. Normal caliber pulmonary arteries. Right subclavian MediPort terminates in the middle third of the superior vena cava. Heterogeneous thyroid gland without discrete thyroid nodule. Unremarkable esophagus. No pathologically enlarged axillary, mediastinal or gross hilar lymph nodes, noting limited sensitivity for the detection of hilar adenopathy on this noncontrast study. Right axillary surgical clips are again noted.  Lungs/Pleura: No pneumothorax. No pleural effusion. Moderate centrilobular and paraseptal emphysema with mild diffuse bronchial wall thickening. There is a subsolid 2.4 x 1.5 cm left upper lobe pulmonary nodule (series 8/ image 51), previously 2.2 x 1.4 cm on 09/09/2015 and 1.4 x 0.7 cm on 03/21/2015 using similar measurement technique, increased in size. Parenchymal bands at both lung bases are most consistent with scarring and/ or atelectasis. No acute consolidative airspace disease or additional significant pulmonary nodules. No  significant regions of subpleural reticulation, traction bronchiectasis or frank honeycombing. No significant air trapping on the expiration sequence.  Upper abdomen: Unremarkable.  Musculoskeletal: No aggressive appearing focal osseous lesions. Mild thoracic spondylosis.  IMPRESSION: 1. Mild interval growth of subsolid left upper lobe pulmonary nodule, which was hypermetabolic on 95/18/8416 PET-CT. A primary bronchogenic adenocarcinoma cannot be excluded. Thoracic surgical consultation is advised. 2. No lymphadenopathy or other findings of metastatic disease in the chest. 3. Moderate emphysema with mild diffuse bronchial wall thickening, suggesting COPD. 4. Aortic atherosclerosis. 5. Stable small pericardial effusion/thickening. These results will be called to the ordering clinician or representative by the Radiologist Assistant, and communication documented in the PACS or zVision Dashboard.   Electronically Signed   By: Ilona Sorrel M.D.   On: 03/01/2016 17:03  I personally reviewed the CT chest and compared it to the CT form 9/16 and the PET CT from 3/17  Impression: Mr. Brickell is a 65 yo man with a history of tobacco abuse and stage IV B cell lymphoma who has a left upper lobe sub-solid nodule. The nodule was first noted on PET in March and was markedly hypermetabolic, more so than would be expected for a low grade adenocarcinoma. Over the past 6 months it has increased minimally in size.  I suspect the nodule is infectious (atypical) or inflammatory but cannot rule out the possibility of malignancy based on imaging. I discussed 3 options with Mr. Rapaport. 1) continued radiographic follow up, 2) navigational bronchoscopy, 3) surgical resection. The nodule is relatively central and not amenable to a wedge resection. I do not think surgery is an appropriate 1st step in Mr. Strand's place. In my opinion the best option is navigational bronchoscopy for tissue sampling for path  and culture.  I discussed the general nature of the procedure, including the need for general anesthesia. He understands this would be done in the OR as an outpatient procedure. I informed him and his daughter of the indications, risks, benefits and alternatives. They understand the risks include those associated with general anesthesia. The risks include, but are not limited to death, stroke, MI, DVT/PE, bleeding, pneumothorax. They understand  there is a distinct possibility we will be unable to make a definitive diagnosis. They also are aware a negative biopsy does not rule the possibility of cancer.  He accepts the risks and agrees to proceed.  Plan:  Electromagnetic navigational bronchoscopy Wed 03/24/16  Melrose Nakayama, MD [facility] [phone]

## 2016-03-24 NOTE — Discharge Instructions (Addendum)
Do not drive or engage in heavy physical activity for 24 hours  You may resume normal activities tomorrow  You may cough up small amounts of blood over the next few days  Yo may take acetaminophen (Tylenol) if needed for discomfort  Call [phone] if you develop chest pain or shortness of breath or cough up more than 2 tablespoons of blood  My office will contact you with follow up information

## 2016-03-25 ENCOUNTER — Encounter (HOSPITAL_COMMUNITY): Payer: Self-pay | Admitting: Thoracic Surgery (Cardiothoracic Vascular Surgery)

## 2016-03-25 LAB — ACID FAST SMEAR (AFB, MYCOBACTERIA): Acid Fast Smear: NEGATIVE

## 2016-03-25 LAB — ACID FAST SMEAR (AFB): ACID FAST SMEAR - AFSCU2: NEGATIVE

## 2016-03-25 NOTE — Op Note (Signed)
NAME:  [name] [name], [name] [name] NO.:  [id number]  MEDICAL RECORD NO.:  [id number]  LOCATION:  [facility]                         FACILITY:  [facility]  PHYSICIAN:  Revonda Standard. Roxan Hockey, M.D.DATE OF BIRTH:  June 23, 1951  DATE OF PROCEDURE:  03/24/2016 DATE OF DISCHARGE:  03/24/2016                              OPERATIVE REPORT   PREOPERATIVE DIAGNOSIS:  Ground-glass opacity left upper lobe.  POSTOPERATIVE DIAGNOSIS:  Ground-glass opacity left upper lobe.  PROCEDURE:  Electromagnetic navigational bronchoscopy with needle aspirations, brushings, and biopsies.  SURGEON:  Revonda Standard. Roxan Hockey, M.D.  ASSISTANT:  None.  ANESTHESIA:  General.  FINDINGS:  Navigated to within 1.2 cm and then 8 mm of the nodule.  Initial prep showed a few atypical cells but were nondiagnostic.  CLINICAL NOTE:  [name] is a 65 year old gentleman, recently treated for non-Hodgkin lymphoma.  On a PET-CT in March of this year there was a small left upper lobe nodule that was hypermetabolic.  This felt to be inflammatory.  He recently had a followup CT which showed a slight increase in size.  He was referred for biopsy to rule out a primary lung cancer.  The indications, risks, benefits, and alternatives to bronchoscopy were discussed in detail with the patient.  He understood and accepted the risks and agreed to proceed.  He did understand that a negative biopsy did not rule out the possibility of an underlying malignancy.  CLINICAL NOTE:  [name] was brought to the operating room on March 24, 2016.  He had induction of general anesthesia.  Flexible fiberoptic bronchoscopy was performed via the endotracheal tube.  It revealed normal endobronchial anatomy and no endobronchial lesions. There were some clear secretions.  The locatable guide for navigation then was advanced through the scope and registration was performed.  There was good correlation of the video and virtual bronchoscopy.  The  locatable guide then was advanced according to the plan that was done preoperatively into the appropriate subsegmental bronchus in the left upper lobe and navigated within 1.2 cm of the nodule.  Multiple samples then were taken.  Initially needle aspirations were performed, followed by brushings using a triple brush and following finally biopsies.  Multiple samples were taken with each modality.  Fluoroscopy was used during all sampling.  The locatable guide was replaced periodically to ensure continued alignment and proximity to the lesion.  The catheter was repositioned slightly and the locatable guide was now with an 8 mm of the center of the nodule. Sampling was again performed with the brush and biopsies.  The brushings and needle aspirations were sent for quick prep.  The biopsies were all sent for permanent pathology and separate 2 small pieces which were sent for AFB and fungal cultures.  Additional biopsies were performed. Bronchoalveolar lavage then was performed with 100 mL of saline with approximately a 20 mL return.  This specimen was sent for both AFB and fungal cultures and cytology.  The quick preps returned showing a few scattered atypical cells but no definite malignancy.  We will await final pathology.  The patient was extubated in the operating room and taken to the postanesthetic care unit in good condition.  Total  fluoroscopy time was 3 minutes.     Revonda Standard Roxan Hockey, M.D.     SCH/MEDQ  D:  03/24/2016  T:  03/25/2016  Job:  [id number]

## 2016-03-26 LAB — AEROBIC CULTURE  (SUPERFICIAL SPECIMEN)

## 2016-03-26 LAB — AEROBIC CULTURE W GRAM STAIN (SUPERFICIAL SPECIMEN): Culture: NO GROWTH

## 2016-03-27 LAB — CULTURE, RESPIRATORY W GRAM STAIN

## 2016-03-27 LAB — CULTURE, RESPIRATORY: CULTURE: NORMAL

## 2016-03-30 ENCOUNTER — Ambulatory Visit (INDEPENDENT_AMBULATORY_CARE_PROVIDER_SITE_OTHER): Payer: Medicare Other | Admitting: Thoracic Surgery (Cardiothoracic Vascular Surgery)

## 2016-03-30 VITALS — BP 132/73 | HR 83 | Resp 20 | Ht 70.0 in | Wt 168.0 lb

## 2016-03-30 DIAGNOSIS — Z9889 Other specified postprocedural states: Secondary | ICD-10-CM

## 2016-03-30 DIAGNOSIS — R911 Solitary pulmonary nodule: Secondary | ICD-10-CM

## 2016-03-30 NOTE — Progress Notes (Signed)
[address]       [place] [address]             [phone]       HPI: Mr. Mase returns today to discuss results of his navigational bronchoscopy.  Mr. Robison is a 65 yo man with a history of stage IV non-Hodgkin's B cell lymphoma, hypertension, stage III CKD, type II diabetes without complication, hyperparathyroidism, and anemia. He has a remote history of ethanol abuse and tobacco abuse (1 ppd x 40 years, quit 7 years ago). He was diagnosed with B cell lymphoma in 5/16. He received 4 cycles of R CHOP. He had a complete response to treatment. He is receiving maintenance chemo every other month.    A PET CT was done in 3/17. There was a small left upper lobe nodule that was hypermetabolic with an SUV of 6.2. It was felt to be most likely inflammatory. However on a follow up CT recently there was a slight increase in size. He was referred for consultation. I recommended a navigational bronchoscopy and we did that on 03/24/2016. He tolerated the procedure well without any issues.  Biopsies showed adenocarcinoma.  Zubrod Score: At the time of surgery this patient's most appropriate activity status/level should be described as: '[]'$     0    Normal activity, no symptoms '[x]'$     1    Restricted in physical strenuous activity but ambulatory, able to do out light work '[]'$     2    Ambulatory and capable of self care, unable to do work activities, up and about >50 % of waking hours                              '[]'$     3    Only limited self care, in bed greater than 50% of waking hours '[]'$     4    Completely disabled, no self care, confined to bed or chair '[]'$     5    Moribund   Current Outpatient Prescriptions  Medication Sig Dispense Refill  . amLODipine (NORVASC) 10 MG tablet TAKE ONE TABLET BY MOUTH ONCE DAILY 90 tablet 3  . aspirin 81 MG tablet Take 81 mg by mouth daily.    . fish oil-omega-3 fatty acids 1000 MG capsule Take 1 g by mouth daily.    Marland Kitchen glipiZIDE (GLUCOTROL  XL) 10 MG 24 hr tablet TAKE ONE TABLET BY MOUTH ONCE DAILY 90 tablet 3  . glucose blood (ACCU-CHEK AVIVA PLUS) test strip Use as instructed 100 each 4  . hydrochlorothiazide (HYDRODIURIL) 25 MG tablet TAKE ONE TABLET BY MOUTH ONCE DAILY 90 tablet 3  . losartan (COZAAR) 100 MG tablet Take 1 tablet (100 mg total) by mouth daily. 90 tablet 3  . pravastatin (PRAVACHOL) 40 MG tablet TAKE ONE TABLET BY MOUTH ONCE DAILY 90 tablet 3  . sitaGLIPtin (JANUVIA) 50 MG tablet Take 1 tablet (50 mg total) by mouth daily. 30 tablet 12   No current facility-administered medications for this visit.     Physical Exam BP 132/73   Pulse 83   Resp 20   Ht '5\' 10"'$  (1.778 m)   Wt 168 lb (76.2 kg)   SpO2 98% Comment: RA  BMI 24.13 kg/m  65 year old man in no acute distress Well-developed well-nourished Alert and oriented 3 with no focal deficits No cervical or supraclavicular adenopathy Lungs clear with equal  pulses bilaterally Cardiac regular rate and rhythm normal S1 and S2 Abdomen soft nontender  Diagnostic Tests: I again reviewed his CT and PET/CT.  I also reviewed his pulmonary function testing. His FEV1 is 2.09 (67%) and FEV1 to FVC ratio is 72%. His diffusion capacity is 13.15 which is 39% of predicted, DL/VA is 61%.  Impression: 65 year old man with a biopsy-proven adenocarcinoma of the left upper lobe. He has no evidence of regional or distant metastases. This lesion is potentially surgically resectable or also could be treated with stereotactic radiation.  Had a long discussion with Mr. Quast and his daughter. I discussed the relative advantages and disadvantages of surgery and stereotactic radiation. The obvious disadvantages of surgery is pain in the major surgical procedure which could lead to complications and/or death. It does however provide better margins as well as removing vessels and lymphatics and lymph nodes. Anatomic resection does have a better survival. Stereotactic radiation does  not involve general anesthesia or incisions, but does have a higher risk of local recurrence.  He would be a candidate for a minimally invasive VATS approach. Depending on intraoperative findings he may also be a candidate for a lingular sparing approach.  I described the proposed operation to Mr. Edmonston in detail. This would be a left VATS and left upper lobectomy (possible lingular sparing). He understands it would be done in the operating room under general anesthesia. We discussed the incisions to be used, the use of drainage tubes postoperatively, the expected hospital stay, and the overall recovery. He does understand that it is not a guarantee of cure. I reviewed the indications, risks, benefits, and alternatives. He understands the risk include, but not limited to death, MI, DVT, PE, bleeding, possible need for transfusion, infection, prolonged air leak, cardiac arrhythmias, as well as possibility of other unforeseeable complications.  He accepts the risks and wishes to proceed.  Plan: Left VATS, left upper lobectomy on Wednesday, April 14, 2016  Melrose Nakayama, MD [facility] [phone]

## 2016-03-31 ENCOUNTER — Other Ambulatory Visit: Payer: Self-pay | Admitting: *Deleted

## 2016-03-31 DIAGNOSIS — R911 Solitary pulmonary nodule: Secondary | ICD-10-CM

## 2016-04-01 ENCOUNTER — Ambulatory Visit (INDEPENDENT_AMBULATORY_CARE_PROVIDER_SITE_OTHER): Payer: Medicare Other

## 2016-04-01 DIAGNOSIS — Z23 Encounter for immunization: Secondary | ICD-10-CM

## 2016-04-09 ENCOUNTER — Other Ambulatory Visit: Payer: Self-pay | Admitting: Physician Assistant

## 2016-04-09 DIAGNOSIS — E1122 Type 2 diabetes mellitus with diabetic chronic kidney disease: Secondary | ICD-10-CM

## 2016-04-12 ENCOUNTER — Encounter (HOSPITAL_COMMUNITY)
Admission: RE | Admit: 2016-04-12 | Discharge: 2016-04-12 | Disposition: A | Discharge status: Home / Self Care | Payer: Medicare Other | Source: Ambulatory Visit | Attending: Thoracic Surgery (Cardiothoracic Vascular Surgery) | Admitting: Thoracic Surgery (Cardiothoracic Vascular Surgery)

## 2016-04-12 ENCOUNTER — Encounter (HOSPITAL_COMMUNITY): Payer: Self-pay

## 2016-04-12 ENCOUNTER — Ambulatory Visit (HOSPITAL_COMMUNITY)
Admission: RE | Admit: 2016-04-12 | Discharge: 2016-04-12 | Disposition: A | Discharge status: Home / Self Care | Payer: Medicare Other | Source: Ambulatory Visit | Attending: Thoracic Surgery (Cardiothoracic Vascular Surgery) | Admitting: Thoracic Surgery (Cardiothoracic Vascular Surgery)

## 2016-04-12 ENCOUNTER — Other Ambulatory Visit (HOSPITAL_COMMUNITY): Payer: Self-pay | Admitting: *Deleted

## 2016-04-12 DIAGNOSIS — R911 Solitary pulmonary nodule: Secondary | ICD-10-CM

## 2016-04-12 DIAGNOSIS — Z87891 Personal history of nicotine dependence: Secondary | ICD-10-CM

## 2016-04-12 DIAGNOSIS — Z01812 Encounter for preprocedural laboratory examination: Secondary | ICD-10-CM

## 2016-04-12 DIAGNOSIS — E1122 Type 2 diabetes mellitus with diabetic chronic kidney disease: Secondary | ICD-10-CM | POA: Insufficient documentation

## 2016-04-12 DIAGNOSIS — C8338 Diffuse large B-cell lymphoma, lymph nodes of multiple sites: Secondary | ICD-10-CM

## 2016-04-12 DIAGNOSIS — N183 Chronic kidney disease, stage 3 (moderate): Secondary | ICD-10-CM

## 2016-04-12 DIAGNOSIS — Z01818 Encounter for other preprocedural examination: Secondary | ICD-10-CM | POA: Insufficient documentation

## 2016-04-12 DIAGNOSIS — D630 Anemia in neoplastic disease: Secondary | ICD-10-CM

## 2016-04-12 DIAGNOSIS — E785 Hyperlipidemia, unspecified: Secondary | ICD-10-CM | POA: Insufficient documentation

## 2016-04-12 DIAGNOSIS — I129 Hypertensive chronic kidney disease with stage 1 through stage 4 chronic kidney disease, or unspecified chronic kidney disease: Secondary | ICD-10-CM

## 2016-04-12 LAB — URINALYSIS, ROUTINE W REFLEX MICROSCOPIC
Bilirubin Urine: NEGATIVE
GLUCOSE, UA: NEGATIVE mg/dL
HGB URINE DIPSTICK: NEGATIVE
KETONES UR: NEGATIVE mg/dL
Leukocytes, UA: NEGATIVE
Nitrite: NEGATIVE
PH: 6.5 (ref 5.0–8.0)
PROTEIN: NEGATIVE mg/dL
Specific Gravity, Urine: 1.013 (ref 1.005–1.030)

## 2016-04-12 LAB — COMPREHENSIVE METABOLIC PANEL
ALBUMIN: 4.2 g/dL (ref 3.5–5.0)
ALK PHOS: 66 U/L (ref 38–126)
ALT: 30 U/L (ref 17–63)
ANION GAP: 10 (ref 5–15)
AST: 29 U/L (ref 15–41)
BUN: 29 mg/dL — ABNORMAL HIGH (ref 6–20)
CHLORIDE: 107 mmol/L (ref 101–111)
CO2: 22 mmol/L (ref 22–32)
Calcium: 9.1 mg/dL (ref 8.9–10.3)
Creatinine, Ser: 2.06 mg/dL — ABNORMAL HIGH (ref 0.61–1.24)
GFR calc non Af Amer: 32 mL/min — ABNORMAL LOW (ref >60)
GFR, EST AFRICAN AMERICAN: 37 mL/min — AB (ref >60)
Glucose, Bld: 118 mg/dL — ABNORMAL HIGH (ref 65–99)
POTASSIUM: 4 mmol/L (ref 3.5–5.1)
SODIUM: 139 mmol/L (ref 135–145)
Total Bilirubin: 0.6 mg/dL (ref 0.3–1.2)
Total Protein: 6.8 g/dL (ref 6.5–8.1)

## 2016-04-12 LAB — CBC
HEMATOCRIT: 38.9 % — AB (ref 39.0–52.0)
HEMOGLOBIN: 13.4 g/dL (ref 13.0–17.0)
MCH: 28.8 pg (ref 26.0–34.0)
MCHC: 34.4 g/dL (ref 30.0–36.0)
MCV: 83.5 fL (ref 78.0–100.0)
Platelets: 232 10*3/uL (ref 150–400)
RBC: 4.66 MIL/uL (ref 4.22–5.81)
RDW: 13.2 % (ref 11.5–15.5)
WBC: 11.9 10*3/uL — ABNORMAL HIGH (ref 4.0–10.5)

## 2016-04-12 LAB — BLOOD GAS, ARTERIAL
Acid-Base Excess: 0.3 mmol/L (ref 0.0–2.0)
Bicarbonate: 24.3 mmol/L (ref 20.0–28.0)
Drawn by: 449841
FIO2: 0.21
O2 Saturation: 95.2 %
PATIENT TEMPERATURE: 98.6
PH ART: 7.413 (ref 7.350–7.450)
PO2 ART: 79 mmHg — AB (ref 83.0–108.0)
pCO2 arterial: 38.8 mmHg (ref 32.0–48.0)

## 2016-04-12 LAB — APTT: APTT: 30 s (ref 24–36)

## 2016-04-12 LAB — PROTIME-INR
INR: 1
Prothrombin Time: 13.2 seconds (ref 11.4–15.2)

## 2016-04-12 LAB — GLUCOSE, CAPILLARY: GLUCOSE-CAPILLARY: 82 mg/dL (ref 65–99)

## 2016-04-12 LAB — SURGICAL PCR SCREEN
MRSA, PCR: NEGATIVE
Staphylococcus aureus: NEGATIVE

## 2016-04-12 LAB — ABO/RH: ABO/RH(D): O POS

## 2016-04-12 NOTE — Pre-Procedure Instructions (Signed)
[name] [name]  04/12/2016      [id number]: [phone]: [phone] [place], [place] [address] [place] ([place] [address]Phone: [phone]: [phone]   Your procedure is scheduled on  04-14-2016  Wednesday  .  [facility]   Call this number if you have problems the morning of surgery:  [phone]  Remember:  Do not eat food or drink liquids after midnight.  Take these medicines the morning of surgery with A SIP OF WATER  Amlodipine(norvasc),Pravastatin(Pravachol)              STOP ASPIRIN,ANTIINFLAMATORIES (IBUPROFEN,ALEVE,MOTRIN,ADVIL,GOODY'S POWDERS),HERBAL SUPPLEMENTS,FISH OIL,AND VITAMINS 5-7 DAYS PRIOR TO SURGERY                        How to Manage Your Diabetes Before and After Surgery  Why is it important to control my blood sugar before and after surgery? . Improving blood sugar levels before and after surgery helps healing and can limit problems. . A way of improving blood sugar control is eating a healthy diet by: o  Eating less sugar and carbohydrates o  Increasing activity/exercise o  Talking with your doctor about reaching your blood sugar goals . High blood sugars (greater than 180 mg/dL) can raise your risk of infections and slow your recovery, so you will need to focus on controlling your diabetes during the weeks before surgery. . Make sure that the doctor who takes care of your diabetes knows about your planned surgery including the date and location.  How do I manage my blood sugar before surgery? . Check your blood sugar at least 4 times a day, starting 2 days before surgery, to make sure that the level is not too high or low. o Check your blood sugar the morning of your surgery when you wake up and every 2 hours until  you get to the Short Stay unit. . If your blood sugar is less than 70 mg/dL, you will need to treat for low blood sugar: o Do not take insulin. o Treat a low blood sugar (less than 70 mg/dL) with  cup of clear juice (cranberry or apple), 4 glucose tablets, OR glucose gel. o Recheck blood sugar in 15 minutes after treatment (to make sure it is greater than 70 mg/dL). If your blood sugar is not greater than 70 mg/dL on recheck, call [phone] further instructions. . Report your blood sugar to the short stay nurse when you get to Short Stay.  . If you are admitted to the hospital after surgery: o Your blood sugar will be checked by the staff and you will probably be given insulin after surgery (instead of oral diabetes medicines) to make sure you have good blood sugar levels. o The goal for blood sugar control after surgery is 80-180 mg/dL.              WHAT DO I DO ABOUT MY DIABETES MEDICATION?   .Marland KitchenDo not take oral diabetes medicines (pills) the morning of surgery.       Reviewed and Endorsed by [facility] Education Committee, August 2015   Do not wear jewelry  Do not wear lotions, powders, or perfumes, or deoderant.  Do not shave 48 hours  prior to surgery.  Men may shave face and neck.   Do not bring valuables to the hospital.  [facility] is not responsible for any belongings or valuables.  Contacts, dentures or bridgework may not be worn into surgery.  Leave your suitcase in the car.  After surgery it may be brought to your room.  For patients admitted to the hospital, discharge time will be determined by your treatment team.  Patients discharged the day of surgery will not be allowed to drive home.    Special instructions:  See attached Sheet for instructions on CHG showers

## 2016-04-13 LAB — HEMOGLOBIN A1C
Hgb A1c MFr Bld: 6.9 % — ABNORMAL HIGH (ref 4.8–5.6)
MEAN PLASMA GLUCOSE: 151 mg/dL

## 2016-04-14 ENCOUNTER — Inpatient Hospital Stay (HOSPITAL_COMMUNITY): Payer: Medicare Other

## 2016-04-14 ENCOUNTER — Encounter (HOSPITAL_COMMUNITY): Payer: Self-pay | Admitting: *Deleted

## 2016-04-14 ENCOUNTER — Inpatient Hospital Stay (HOSPITAL_COMMUNITY): Payer: Medicare Other | Admitting: Certified Registered Nurse Anesthetist

## 2016-04-14 ENCOUNTER — Encounter (HOSPITAL_COMMUNITY)
Admission: RE | Disposition: A | Discharge status: Home / Self Care | Payer: Self-pay | Source: Ambulatory Visit | Attending: Thoracic Surgery (Cardiothoracic Vascular Surgery)

## 2016-04-14 ENCOUNTER — Inpatient Hospital Stay (HOSPITAL_COMMUNITY)
Admission: RE | Admit: 2016-04-14 | Discharge: 2016-04-26 | DRG: 164 | Disposition: A | Discharge status: Home / Self Care | Payer: Medicare Other | Source: Ambulatory Visit | Attending: Thoracic Surgery (Cardiothoracic Vascular Surgery) | Admitting: Thoracic Surgery (Cardiothoracic Vascular Surgery)

## 2016-04-14 DIAGNOSIS — Z7984 Long term (current) use of oral hypoglycemic drugs: Secondary | ICD-10-CM

## 2016-04-14 DIAGNOSIS — N183 Chronic kidney disease, stage 3 (moderate): Secondary | ICD-10-CM | POA: Diagnosis present

## 2016-04-14 DIAGNOSIS — R490 Dysphonia: Secondary | ICD-10-CM | POA: Diagnosis present

## 2016-04-14 DIAGNOSIS — C8330 Diffuse large B-cell lymphoma, unspecified site: Secondary | ICD-10-CM | POA: Diagnosis present

## 2016-04-14 DIAGNOSIS — E1122 Type 2 diabetes mellitus with diabetic chronic kidney disease: Secondary | ICD-10-CM | POA: Diagnosis present

## 2016-04-14 DIAGNOSIS — Z87891 Personal history of nicotine dependence: Secondary | ICD-10-CM | POA: Diagnosis not present

## 2016-04-14 DIAGNOSIS — J95811 Postprocedural pneumothorax: Secondary | ICD-10-CM | POA: Diagnosis not present

## 2016-04-14 DIAGNOSIS — I129 Hypertensive chronic kidney disease with stage 1 through stage 4 chronic kidney disease, or unspecified chronic kidney disease: Secondary | ICD-10-CM | POA: Diagnosis present

## 2016-04-14 DIAGNOSIS — N179 Acute kidney failure, unspecified: Secondary | ICD-10-CM | POA: Diagnosis not present

## 2016-04-14 DIAGNOSIS — C3412 Malignant neoplasm of upper lobe, left bronchus or lung: Secondary | ICD-10-CM | POA: Diagnosis present

## 2016-04-14 DIAGNOSIS — Y848 Other medical procedures as the cause of abnormal reaction of the patient, or of later complication, without mention of misadventure at the time of the procedure: Secondary | ICD-10-CM | POA: Diagnosis not present

## 2016-04-14 DIAGNOSIS — R911 Solitary pulmonary nodule: Secondary | ICD-10-CM

## 2016-04-14 DIAGNOSIS — R05 Cough: Secondary | ICD-10-CM | POA: Diagnosis present

## 2016-04-14 DIAGNOSIS — Z902 Acquired absence of lung [part of]: Secondary | ICD-10-CM

## 2016-04-14 DIAGNOSIS — J939 Pneumothorax, unspecified: Secondary | ICD-10-CM

## 2016-04-14 DIAGNOSIS — Z9689 Presence of other specified functional implants: Secondary | ICD-10-CM

## 2016-04-14 DIAGNOSIS — C3492 Malignant neoplasm of unspecified part of left bronchus or lung: Secondary | ICD-10-CM | POA: Diagnosis present

## 2016-04-14 DIAGNOSIS — Z4682 Encounter for fitting and adjustment of non-vascular catheter: Secondary | ICD-10-CM

## 2016-04-14 DIAGNOSIS — Z7982 Long term (current) use of aspirin: Secondary | ICD-10-CM

## 2016-04-14 HISTORY — PX: VIDEO ASSISTED THORACOSCOPY (VATS)/ LOBECTOMY: SHX6169

## 2016-04-14 LAB — GLUCOSE, CAPILLARY
GLUCOSE-CAPILLARY: 135 mg/dL — AB (ref 65–99)
GLUCOSE-CAPILLARY: 156 mg/dL — AB (ref 65–99)
GLUCOSE-CAPILLARY: 299 mg/dL — AB (ref 65–99)
Glucose-Capillary: 258 mg/dL — ABNORMAL HIGH (ref 65–99)

## 2016-04-14 LAB — PREPARE RBC (CROSSMATCH)

## 2016-04-14 SURGERY — VIDEO ASSISTED THORACOSCOPY (VATS)/ LOBECTOMY
Anesthesia: General | Site: Chest | Laterality: Left

## 2016-04-14 MED ORDER — SUGAMMADEX SODIUM 200 MG/2ML IV SOLN
INTRAVENOUS | Status: AC
  Filled 2016-04-14: qty 2

## 2016-04-14 MED ORDER — MIDAZOLAM HCL 2 MG/2ML IJ SOLN
INTRAMUSCULAR | Status: AC
  Filled 2016-04-14: qty 2

## 2016-04-14 MED ORDER — DEXTROSE 5 % IV SOLN
1.5000 g | Freq: Two times a day (BID) | INTRAVENOUS | Status: AC
  Administered 2016-04-14 – 2016-04-15 (×2): 1.5 g via INTRAVENOUS
  Filled 2016-04-14 (×2): qty 1.5

## 2016-04-14 MED ORDER — SODIUM CHLORIDE 0.9% FLUSH: 9.0000 mL | INTRAVENOUS | Status: DC | PRN

## 2016-04-14 MED ORDER — ACETAMINOPHEN 500 MG PO TABS
1000.0000 mg | ORAL_TABLET | Freq: Four times a day (QID) | ORAL | Status: AC
  Administered 2016-04-16 – 2016-04-19 (×5): 1000 mg via ORAL
  Filled 2016-04-14 (×7): qty 2

## 2016-04-14 MED ORDER — ROCURONIUM BROMIDE 10 MG/ML (PF) SYRINGE
PREFILLED_SYRINGE | INTRAVENOUS | Status: AC
  Filled 2016-04-14: qty 10

## 2016-04-14 MED ORDER — ONDANSETRON HCL 4 MG/2ML IJ SOLN: 4.0000 mg | Freq: Four times a day (QID) | INTRAMUSCULAR | Status: DC | PRN

## 2016-04-14 MED ORDER — OXYCODONE HCL 5 MG PO TABS
5.0000 mg | ORAL_TABLET | ORAL | Status: DC | PRN
  Filled 2016-04-14: qty 1

## 2016-04-14 MED ORDER — INSULIN ASPART 100 UNIT/ML ~~LOC~~ SOLN
0.0000 [IU] | Freq: Four times a day (QID) | SUBCUTANEOUS | Status: DC
  Administered 2016-04-15: 12 [IU] via SUBCUTANEOUS
  Administered 2016-04-15: 8 [IU] via SUBCUTANEOUS

## 2016-04-14 MED ORDER — SODIUM CHLORIDE 0.9 % IV SOLN
INTRAVENOUS | Status: DC
  Administered 2016-04-14 – 2016-04-16 (×2): via INTRAVENOUS

## 2016-04-14 MED ORDER — LIDOCAINE 2% (20 MG/ML) 5 ML SYRINGE
INTRAMUSCULAR | Status: AC
  Filled 2016-04-14: qty 15

## 2016-04-14 MED ORDER — FENTANYL CITRATE (PF) 250 MCG/5ML IJ SOLN
INTRAMUSCULAR | Status: AC
  Filled 2016-04-14: qty 5

## 2016-04-14 MED ORDER — LACTATED RINGERS IV SOLN: INTRAVENOUS | Status: DC | PRN

## 2016-04-14 MED ORDER — TRAMADOL HCL 50 MG PO TABS
50.0000 mg | ORAL_TABLET | Freq: Two times a day (BID) | ORAL | Status: DC | PRN
  Administered 2016-04-18: 50 mg via ORAL
  Filled 2016-04-14 (×2): qty 1

## 2016-04-14 MED ORDER — ACETAMINOPHEN 160 MG/5ML PO SOLN
1000.0000 mg | Freq: Four times a day (QID) | ORAL | Status: AC
  Administered 2016-04-14 – 2016-04-15 (×2): 1000 mg via ORAL
  Filled 2016-04-14 (×2): qty 40.6

## 2016-04-14 MED ORDER — POTASSIUM CHLORIDE 10 MEQ/50ML IV SOLN: 10.0000 meq | Freq: Every day | INTRAVENOUS | Status: DC | PRN

## 2016-04-14 MED ORDER — OXYCODONE HCL 5 MG PO TABS: 5.0000 mg | ORAL_TABLET | Freq: Once | ORAL | Status: DC | PRN

## 2016-04-14 MED ORDER — ASPIRIN EC 81 MG PO TBEC
81.0000 mg | DELAYED_RELEASE_TABLET | Freq: Every day | ORAL | Status: DC
  Administered 2016-04-15 – 2016-04-26 (×12): 81 mg via ORAL
  Filled 2016-04-14 (×12): qty 1

## 2016-04-14 MED ORDER — PROPOFOL 10 MG/ML IV BOLUS
INTRAVENOUS | Status: DC | PRN
  Administered 2016-04-14: 200 mg via INTRAVENOUS

## 2016-04-14 MED ORDER — PANTOPRAZOLE SODIUM 40 MG PO TBEC
40.0000 mg | DELAYED_RELEASE_TABLET | Freq: Every day | ORAL | Status: DC
  Administered 2016-04-15 – 2016-04-26 (×12): 40 mg via ORAL
  Filled 2016-04-14 (×12): qty 1

## 2016-04-14 MED ORDER — FENTANYL CITRATE (PF) 100 MCG/2ML IJ SOLN
INTRAMUSCULAR | Status: DC | PRN
  Administered 2016-04-14: 50 ug via INTRAVENOUS
  Administered 2016-04-14: 100 ug via INTRAVENOUS
  Administered 2016-04-14 (×4): 50 ug via INTRAVENOUS

## 2016-04-14 MED ORDER — NALOXONE HCL 0.4 MG/ML IJ SOLN: 0.4000 mg | INTRAMUSCULAR | Status: DC | PRN

## 2016-04-14 MED ORDER — BISACODYL 5 MG PO TBEC
10.0000 mg | DELAYED_RELEASE_TABLET | Freq: Every day | ORAL | Status: DC
  Administered 2016-04-15 – 2016-04-22 (×5): 10 mg via ORAL
  Filled 2016-04-14 (×11): qty 2

## 2016-04-14 MED ORDER — EPINEPHRINE PF 1 MG/10ML IJ SOSY
PREFILLED_SYRINGE | INTRAMUSCULAR | Status: AC
  Filled 2016-04-14: qty 20

## 2016-04-14 MED ORDER — HYDROMORPHONE HCL 1 MG/ML IJ SOLN
0.2500 mg | INTRAMUSCULAR | Status: DC | PRN
  Administered 2016-04-14 (×4): 0.5 mg via INTRAVENOUS

## 2016-04-14 MED ORDER — BUPIVACAINE HCL (PF) 0.5 % IJ SOLN
INTRAMUSCULAR | Status: DC | PRN
  Administered 2016-04-14: 10 mL

## 2016-04-14 MED ORDER — ONDANSETRON HCL 4 MG/2ML IJ SOLN
INTRAMUSCULAR | Status: AC
  Filled 2016-04-14: qty 2

## 2016-04-14 MED ORDER — DEXTROSE 5 % IV SOLN
1.5000 g | INTRAVENOUS | Status: AC
  Administered 2016-04-14: 1.5 g via INTRAVENOUS

## 2016-04-14 MED ORDER — DIPHENHYDRAMINE HCL 12.5 MG/5ML PO ELIX: 12.5000 mg | ORAL_SOLUTION | Freq: Four times a day (QID) | ORAL | Status: DC | PRN

## 2016-04-14 MED ORDER — OXYCODONE HCL 5 MG/5ML PO SOLN: 5.0000 mg | Freq: Once | ORAL | Status: DC | PRN

## 2016-04-14 MED ORDER — ONDANSETRON HCL 4 MG/2ML IJ SOLN
INTRAMUSCULAR | Status: DC | PRN
  Administered 2016-04-14: 4 mg via INTRAVENOUS

## 2016-04-14 MED ORDER — PRAVASTATIN SODIUM 40 MG PO TABS
40.0000 mg | ORAL_TABLET | Freq: Every day | ORAL | Status: DC
  Administered 2016-04-15 – 2016-04-26 (×12): 40 mg via ORAL
  Filled 2016-04-14 (×12): qty 1

## 2016-04-14 MED ORDER — PHENYLEPHRINE HCL 10 MG/ML IJ SOLN
INTRAVENOUS | Status: DC | PRN
  Administered 2016-04-14: 25 ug/min via INTRAVENOUS

## 2016-04-14 MED ORDER — HYDROMORPHONE HCL 1 MG/ML IJ SOLN
INTRAMUSCULAR | Status: AC
  Filled 2016-04-14: qty 1

## 2016-04-14 MED ORDER — SODIUM CHLORIDE 0.9 % IV SOLN
INTRAVENOUS | Status: DC
  Administered 2016-04-14 (×2): via INTRAVENOUS

## 2016-04-14 MED ORDER — 0.9 % SODIUM CHLORIDE (POUR BTL) OPTIME
TOPICAL | Status: DC | PRN
  Administered 2016-04-14: 2000 mL

## 2016-04-14 MED ORDER — LACTATED RINGERS IV SOLN
INTRAVENOUS | Status: DC | PRN
  Administered 2016-04-14 (×2): via INTRAVENOUS

## 2016-04-14 MED ORDER — MIDAZOLAM HCL 5 MG/5ML IJ SOLN
INTRAMUSCULAR | Status: DC | PRN
  Administered 2016-04-14 (×2): 1 mg via INTRAVENOUS

## 2016-04-14 MED ORDER — FENTANYL 40 MCG/ML IV SOLN
INTRAVENOUS | Status: DC
  Administered 2016-04-14: 0 ug via INTRAVENOUS
  Administered 2016-04-14: 17:00:00 via INTRAVENOUS
  Administered 2016-04-14 – 2016-04-21 (×12): 0 ug via INTRAVENOUS

## 2016-04-14 MED ORDER — SENNOSIDES-DOCUSATE SODIUM 8.6-50 MG PO TABS
1.0000 | ORAL_TABLET | Freq: Every day | ORAL | Status: DC
  Administered 2016-04-15 – 2016-04-25 (×4): 1 via ORAL
  Filled 2016-04-14 (×7): qty 1

## 2016-04-14 MED ORDER — ROCURONIUM BROMIDE 100 MG/10ML IV SOLN
INTRAVENOUS | Status: DC | PRN
  Administered 2016-04-14 (×3): 20 mg via INTRAVENOUS
  Administered 2016-04-14: 50 mg via INTRAVENOUS

## 2016-04-14 MED ORDER — BUPIVACAINE 0.5 % ON-Q PUMP SINGLE CATH 400 ML
400.0000 mL | INJECTION | Status: AC
  Administered 2016-04-14: 400 mL
  Filled 2016-04-14: qty 400

## 2016-04-14 MED ORDER — BUPIVACAINE HCL (PF) 0.5 % IJ SOLN
INTRAMUSCULAR | Status: AC
  Filled 2016-04-14: qty 10

## 2016-04-14 MED ORDER — FENTANYL 40 MCG/ML IV SOLN
INTRAVENOUS | Status: AC
  Filled 2016-04-14: qty 25

## 2016-04-14 MED ORDER — BUPIVACAINE 0.5 % ON-Q PUMP SINGLE CATH 400 ML: INJECTION | Status: DC | PRN

## 2016-04-14 MED ORDER — ENOXAPARIN SODIUM 40 MG/0.4ML ~~LOC~~ SOLN
40.0000 mg | SUBCUTANEOUS | Status: DC
  Administered 2016-04-16 – 2016-04-23 (×8): 40 mg via SUBCUTANEOUS
  Filled 2016-04-14 (×12): qty 0.4

## 2016-04-14 MED ORDER — PROPOFOL 10 MG/ML IV BOLUS
INTRAVENOUS | Status: AC
  Filled 2016-04-14: qty 20

## 2016-04-14 MED ORDER — ONDANSETRON HCL 4 MG/2ML IJ SOLN: 4.0000 mg | Freq: Once | INTRAMUSCULAR | Status: DC | PRN

## 2016-04-14 MED ORDER — FENTANYL CITRATE (PF) 100 MCG/2ML IJ SOLN
INTRAMUSCULAR | Status: AC
  Filled 2016-04-14: qty 2

## 2016-04-14 MED ORDER — SUGAMMADEX SODIUM 200 MG/2ML IV SOLN
INTRAVENOUS | Status: DC | PRN
  Administered 2016-04-14: 200 mg via INTRAVENOUS

## 2016-04-14 MED ORDER — HEMOSTATIC AGENTS (NO CHARGE) OPTIME
TOPICAL | Status: DC | PRN
  Administered 2016-04-14: 1 via TOPICAL

## 2016-04-14 MED ORDER — DIPHENHYDRAMINE HCL 50 MG/ML IJ SOLN: 12.5000 mg | Freq: Four times a day (QID) | INTRAMUSCULAR | Status: DC | PRN

## 2016-04-14 MED ORDER — DEXTROSE 5 % IV SOLN
INTRAVENOUS | Status: AC
  Filled 2016-04-14: qty 1.5

## 2016-04-14 MED ORDER — AMLODIPINE BESYLATE 5 MG PO TABS: 5.0000 mg | ORAL_TABLET | Freq: Every day | ORAL | Status: DC

## 2016-04-14 SURGICAL SUPPLY — 91 items
APPLICATOR COTTON TIP 6IN STRL (MISCELLANEOUS) ×1 IMPLANT
APPLIER CLIP ROT 10 11.4 M/L (STAPLE)
APR CLP MED LRG 11.4X10 (STAPLE)
BAG SPEC RTRVL LRG 6X4 10 (ENDOMECHANICALS)
BLADE SURG 11 STRL SS (BLADE) ×1 IMPLANT
CANISTER SUCTION 2500CC (MISCELLANEOUS) ×2 IMPLANT
CATH KIT ON Q 5IN SLV (PAIN MANAGEMENT) IMPLANT
CATH THORACIC 28FR (CATHETERS) ×1 IMPLANT
CATH THORACIC 28FR RT ANG (CATHETERS) IMPLANT
CATH THORACIC 36FR (CATHETERS) IMPLANT
CATH THORACIC 36FR RT ANG (CATHETERS) IMPLANT
CLIP APPLIE ROT 10 11.4 M/L (STAPLE) IMPLANT
CLIP TI MEDIUM 24 (CLIP) ×1 IMPLANT
CLIP TI MEDIUM 6 (CLIP) ×1 IMPLANT
CONN ST 1/4X3/8  BEN (MISCELLANEOUS) ×1
CONN ST 1/4X3/8 BEN (MISCELLANEOUS) IMPLANT
CONN Y 3/8X3/8X3/8  BEN (MISCELLANEOUS)
CONN Y 3/8X3/8X3/8 BEN (MISCELLANEOUS) IMPLANT
CONT SPEC 4OZ CLIKSEAL STRL BL (MISCELLANEOUS) ×9 IMPLANT
COVER SURGICAL LIGHT HANDLE (MISCELLANEOUS) IMPLANT
CUTTER ECHEON FLEX ENDO 45 340 (ENDOMECHANICALS) ×2 IMPLANT
DRAIN CHANNEL 28F RND 3/8 FF (WOUND CARE) IMPLANT
DRAIN CHANNEL 32F RND 10.7 FF (WOUND CARE) IMPLANT
DRAPE LAPAROSCOPIC ABDOMINAL (DRAPES) ×2 IMPLANT
DRAPE WARM FLUID 44X44 (DRAPE) ×2 IMPLANT
ELECT BLADE 6.5 EXT (BLADE) ×2 IMPLANT
ELECT REM PT RETURN 9FT ADLT (ELECTROSURGICAL) ×2
ELECTRODE REM PT RTRN 9FT ADLT (ELECTROSURGICAL) ×1 IMPLANT
GAUZE SPONGE 4X4 12PLY STRL (GAUZE/BANDAGES/DRESSINGS) ×2 IMPLANT
GLOVE BIO SURGEON STRL SZ 6.5 (GLOVE) ×8 IMPLANT
GLOVE SURG SIGNA 7.5 PF LTX (GLOVE) ×4 IMPLANT
GOWN STRL REUS W/ TWL LRG LVL3 (GOWN DISPOSABLE) ×2 IMPLANT
GOWN STRL REUS W/ TWL XL LVL3 (GOWN DISPOSABLE) ×1 IMPLANT
GOWN STRL REUS W/TWL LRG LVL3 (GOWN DISPOSABLE) ×4
GOWN STRL REUS W/TWL XL LVL3 (GOWN DISPOSABLE) ×2
HEMOSTAT SURGICEL 2X14 (HEMOSTASIS) IMPLANT
KIT BASIN OR (CUSTOM PROCEDURE TRAY) ×2 IMPLANT
KIT ROOM TURNOVER OR (KITS) ×2 IMPLANT
KIT SUCTION CATH 14FR (SUCTIONS) IMPLANT
NS IRRIG 1000ML POUR BTL (IV SOLUTION) ×5 IMPLANT
PACK CHEST (CUSTOM PROCEDURE TRAY) ×2 IMPLANT
PAD ARMBOARD 7.5X6 YLW CONV (MISCELLANEOUS) ×4 IMPLANT
POUCH ENDO CATCH II 15MM (MISCELLANEOUS) ×1 IMPLANT
POUCH SPECIMEN RETRIEVAL 10MM (ENDOMECHANICALS) IMPLANT
RELOAD GOLD ECHELON 45 (STAPLE) ×2 IMPLANT
RELOAD GREEN ECHELON 45 (STAPLE) ×1 IMPLANT
RELOAD STAPLE 35X2.5 WHT THIN (STAPLE) IMPLANT
SCISSORS ENDO CVD 5DCS (MISCELLANEOUS) IMPLANT
SEALANT PROGEL (MISCELLANEOUS) ×1 IMPLANT
SEALANT SURG COSEAL 4ML (VASCULAR PRODUCTS) IMPLANT
SEALANT SURG COSEAL 8ML (VASCULAR PRODUCTS) IMPLANT
SHEARS HARMONIC 23CM COAG (MISCELLANEOUS) ×1 IMPLANT
SOLUTION ANTI FOG 6CC (MISCELLANEOUS) ×2 IMPLANT
SPECIMEN JAR MEDIUM (MISCELLANEOUS) IMPLANT
SPONGE GAUZE 4X4 12PLY STER LF (GAUZE/BANDAGES/DRESSINGS) ×1 IMPLANT
SPONGE INTESTINAL PEANUT (DISPOSABLE) ×3 IMPLANT
SPONGE TONSIL 1 RF SGL (DISPOSABLE) ×2 IMPLANT
STAPLE RELOAD 2.5MM WHITE (STAPLE) ×4 IMPLANT
STAPLER VASCULAR ECHELON 35 (CUTTER) ×4 IMPLANT
SUT PROLENE 4 0 RB 1 (SUTURE)
SUT PROLENE 4-0 RB1 .5 CRCL 36 (SUTURE) IMPLANT
SUT SILK  1 MH (SUTURE) ×2
SUT SILK 1 MH (SUTURE) ×2 IMPLANT
SUT SILK 1 TIES 10X30 (SUTURE) ×2 IMPLANT
SUT SILK 2 0 SH (SUTURE) IMPLANT
SUT SILK 2 0SH CR/8 30 (SUTURE) IMPLANT
SUT SILK 3 0 SH 30 (SUTURE) IMPLANT
SUT SILK 3 0SH CR/8 30 (SUTURE) ×1 IMPLANT
SUT VIC AB 1 CTX 36 (SUTURE) ×4
SUT VIC AB 1 CTX36XBRD ANBCTR (SUTURE) ×1 IMPLANT
SUT VIC AB 2-0 CTX 36 (SUTURE) ×3 IMPLANT
SUT VIC AB 2-0 UR6 27 (SUTURE) IMPLANT
SUT VIC AB 3-0 MH 27 (SUTURE) IMPLANT
SUT VIC AB 3-0 X1 27 (SUTURE) ×3 IMPLANT
SUT VICRYL 2 TP 1 (SUTURE) IMPLANT
SWAB COLLECTION DEVICE MRSA (MISCELLANEOUS) IMPLANT
SYRINGE 10CC LL (SYRINGE) ×2 IMPLANT
SYSTEM SAHARA CHEST DRAIN ATS (WOUND CARE) ×2 IMPLANT
TAPE CLOTH 4X10 WHT NS (GAUZE/BANDAGES/DRESSINGS) ×2 IMPLANT
TAPE CLOTH SURG 4X10 WHT LF (GAUZE/BANDAGES/DRESSINGS) ×1 IMPLANT
TIP APPLICATOR SPRAY EXTEND 16 (VASCULAR PRODUCTS) ×1 IMPLANT
TOWEL OR 17X24 6PK STRL BLUE (TOWEL DISPOSABLE) ×2 IMPLANT
TOWEL OR 17X26 10 PK STRL BLUE (TOWEL DISPOSABLE) ×2 IMPLANT
TOWEL OR NON WOVEN STRL DISP B (DISPOSABLE) ×1 IMPLANT
TRAP SPECIMEN MUCOUS 40CC (MISCELLANEOUS) IMPLANT
TRAY FOLEY CATH 16FRSI W/METER (SET/KITS/TRAYS/PACK) ×2 IMPLANT
TROCAR XCEL BLADELESS 5X75MML (TROCAR) ×2 IMPLANT
TUBE ANAEROBIC SPECIMEN COL (MISCELLANEOUS) IMPLANT
TUBE CONNECTING 12X1/4 (SUCTIONS) ×1 IMPLANT
TUNNELER SHEATH ON-Q 11GX8 DSP (PAIN MANAGEMENT) IMPLANT
WATER STERILE IRR 1000ML POUR (IV SOLUTION) ×2 IMPLANT

## 2016-04-14 NOTE — H&P (View-Only) (Signed)
[address]       [place] [address]             [phone]       HPI: Mr. [name] [name] returns today to discuss results of his navigational bronchoscopy.  Mr. Janusz is a 65 yo man with a history of stage IV non-Hodgkin's B cell lymphoma, hypertension, stage III CKD, type II diabetes without complication, hyperparathyroidism, and anemia. He has a remote history of ethanol abuse and tobacco abuse (1 ppd x 40 years, quit 7 years ago). He was diagnosed with B cell lymphoma in 5/16. He received 4 cycles of R CHOP. He had a complete response to treatment. He is receiving maintenance chemo every other month.    A PET CT was done in 3/17. There was a small left upper lobe nodule that was hypermetabolic with an SUV of 6.2. It was felt to be most likely inflammatory. However on a follow up CT recently there was a slight increase in size. He was referred for consultation. I recommended a navigational bronchoscopy and we did that on 03/24/2016. He tolerated the procedure well without any issues.  Biopsies showed adenocarcinoma.  Zubrod Score: At the time of surgery this patient's most appropriate activity status/level should be described as: '[]'$     0    Normal activity, no symptoms '[x]'$     1    Restricted in physical strenuous activity but ambulatory, able to do out light work '[]'$     2    Ambulatory and capable of self care, unable to do work activities, up and about >50 % of waking hours                              '[]'$     3    Only limited self care, in bed greater than 50% of waking hours '[]'$     4    Completely disabled, no self care, confined to bed or chair '[]'$     5    Moribund   Current Outpatient Prescriptions  Medication Sig Dispense Refill  . amLODipine (NORVASC) 10 MG tablet TAKE ONE TABLET BY MOUTH ONCE DAILY 90 tablet 3  . aspirin 81 MG tablet Take 81 mg by mouth daily.    . fish oil-omega-3 fatty acids 1000 MG capsule Take 1 g by mouth daily.    Marland Kitchen glipiZIDE (GLUCOTROL  XL) 10 MG 24 hr tablet TAKE ONE TABLET BY MOUTH ONCE DAILY 90 tablet 3  . glucose blood (ACCU-CHEK AVIVA PLUS) test strip Use as instructed 100 each 4  . hydrochlorothiazide (HYDRODIURIL) 25 MG tablet TAKE ONE TABLET BY MOUTH ONCE DAILY 90 tablet 3  . losartan (COZAAR) 100 MG tablet Take 1 tablet (100 mg total) by mouth daily. 90 tablet 3  . pravastatin (PRAVACHOL) 40 MG tablet TAKE ONE TABLET BY MOUTH ONCE DAILY 90 tablet 3  . sitaGLIPtin (JANUVIA) 50 MG tablet Take 1 tablet (50 mg total) by mouth daily. 30 tablet 12   No current facility-administered medications for this visit.     Physical Exam BP 132/73   Pulse 83   Resp 20   Ht '5\' 10"'$  (1.778 m)   Wt 168 lb (76.2 kg)   SpO2 98% Comment: RA  BMI 24.104 kg/m  65 year old man in no acute distress Well-developed well-nourished Alert and oriented 3 with no focal deficits No cervical or supraclavicular adenopathy Lungs clear with equal  pulses bilaterally Cardiac regular rate and rhythm normal S1 and S2 Abdomen soft nontender  Diagnostic Tests: I again reviewed his CT and PET/CT.  I also reviewed his pulmonary function testing. His FEV1 is 2.09 (67%) and FEV1 to FVC ratio is 72%. His diffusion capacity is 13.15 which is 39% of predicted, DL/VA is 61%.  Impression: 65 year old man with a biopsy-proven adenocarcinoma of the left upper lobe. He has no evidence of regional or distant metastases. This lesion is potentially surgically resectable or also could be treated with stereotactic radiation.  Had a long discussion with Mr. Corvino and his daughter. I discussed the relative advantages and disadvantages of surgery and stereotactic radiation. The obvious disadvantages of surgery is pain in the major surgical procedure which could lead to complications and/or death. It does however provide better margins as well as removing vessels and lymphatics and lymph nodes. Anatomic resection does have a better survival. Stereotactic radiation does  not involve general anesthesia or incisions, but does have a higher risk of local recurrence.  He would be a candidate for a minimally invasive VATS approach. Depending on intraoperative findings he may also be a candidate for a lingular sparing approach.  I described the proposed operation to Mr. Alverio in detail. This would be a left VATS and left upper lobectomy (possible lingular sparing). He understands it would be done in the operating room under general anesthesia. We discussed the incisions to be used, the use of drainage tubes postoperatively, the expected hospital stay, and the overall recovery. He does understand that it is not a guarantee of cure. I reviewed the indications, risks, benefits, and alternatives. He understands the risk include, but not limited to death, MI, DVT, PE, bleeding, possible need for transfusion, infection, prolonged air leak, cardiac arrhythmias, as well as possibility of other unforeseeable complications.  He accepts the risks and wishes to proceed.  Plan: Left VATS, left upper lobectomy on Wednesday, April 14, 2016  Melrose Nakayama, MD [facility] [phone]

## 2016-04-14 NOTE — Brief Op Note (Addendum)
04/14/2016  3:44 PM  PATIENT:  Cheryle Horsfall  65 y.o. male  PRE-OPERATIVE DIAGNOSIS:  LUL ADENOCACINOMA  POST-OPERATIVE DIAGNOSIS: LUL ADENOCACINOMA- Clinical stage IA  PROCEDURE:   LEFT VIDEO ASSISTED THORACOSCOPY (VATS), THORACOSCOPIC LEFT UPPER LOBECTOMY MEDIASTINAL LYMPH NODE DISSECTION, and  ON Q LOCAL ANESTHETIC CATHETER PLACEMENT  SURGEON:  Surgeon(s) and Role:    * Melrose Nakayama, MD - Primary  PHYSICIAN ASSISTANT: Lars Pinks PA-C  ANESTHESIA:   general  EBL:  Total I/O In: 1000 [I.V.:1000] Out: 15 [Urine:705; Blood:50]  BLOOD ADMINISTERED:none  DRAINS: 89 French chest tube and 28 Blake drain placed in the left pleural space   LOCAL MEDICATIONS USED:  MARCAINE     SPECIMEN:  Source of Specimen:  LUL, multiple lymph nodes  DISPOSITION OF SPECIMEN:  Pathology. Frozen showed bronchial margin negative for tumor  COUNTS CORRECT:  YES  DICTATION: .Dragon Dictation  PLAN OF CARE: Admit to inpatient   PATIENT DISPOSITION:  PACU - hemodynamically stable.   Delay start of Pharmacological VTE agent (>24hrs) due to surgical blood loss or risk of bleeding: yes

## 2016-04-14 NOTE — Anesthesia Postprocedure Evaluation (Signed)
Anesthesia Post Note  Patient: [name] [name]  Procedure(s) Performed: Procedure(s) (LRB): VIDEO ASSISTED THORACOSCOPY (VATS)/LEFT UPPER LOBECTOMY (Left)  Patient location during evaluation: PACU Anesthesia Type: General Level of consciousness: awake, awake and alert and oriented Pain management: pain level controlled Vital Signs Assessment: post-procedure vital signs reviewed and stable Respiratory status: spontaneous breathing, nonlabored ventilation and respiratory function stable Cardiovascular status: blood pressure returned to baseline Anesthetic complications: no    Last Vitals:  Vitals:   04/14/16 1900 04/14/16 1925  BP: 128/61 127/67  Pulse: 80 82  Resp: 15 14  Temp:      Last Pain:  Vitals:   04/14/16 1920  TempSrc:   PainSc: Asleep                 Felice Deem COKER

## 2016-04-14 NOTE — Anesthesia Procedure Notes (Signed)
Procedure Name: Intubation Date/Time: 04/14/2016 12:42 PM Performed by: Everlean Cherry A Pre-anesthesia Checklist: Patient identified, Emergency Drugs available, Suction available and Patient being monitored Patient Re-evaluated:Patient Re-evaluated prior to inductionOxygen Delivery Method: Circle system utilized Preoxygenation: Pre-oxygenation with 100% oxygen Intubation Type: IV induction Ventilation: Mask ventilation without difficulty Laryngoscope Size: Miller and 2 Grade View: Grade I Endobronchial tube: Left, Double lumen EBT, EBT position confirmed by fiberoptic bronchoscope and EBT position confirmed by auscultation and 37 Fr Number of attempts: 1 Airway Equipment and Method: Stylet and Fiberoptic brochoscope Placement Confirmation: ETT inserted through vocal cords under direct vision,  positive ETCO2 and breath sounds checked- equal and bilateral Tube secured with: Tape Dental Injury: Teeth and Oropharynx as per pre-operative assessment

## 2016-04-14 NOTE — Interval H&P Note (Signed)
History and Physical Interval Note:  04/14/2016 11:24 AM  [name] [name]  has presented today for surgery, with the diagnosis of LUL NODULE  The various methods of treatment have been discussed with the patient and family. After consideration of risks, benefits and other options for treatment, the patient has consented to  Procedure(s): VIDEO ASSISTED THORACOSCOPY (VATS)/LEFT UPPER LOBECTOMY (Left) as a surgical intervention .  The patient's history has been reviewed, patient examined, no change in status, stable for surgery.  I have reviewed the patient's chart and labs.  Questions were answered to the patient's satisfaction.     Melrose Nakayama

## 2016-04-14 NOTE — Anesthesia Preprocedure Evaluation (Addendum)
Anesthesia Evaluation  Patient identified by MRN, date of birth, ID band Patient awake    Reviewed: Allergy & Precautions, NPO status , Patient's Chart, lab work & pertinent test results  Airway Mallampati: II       Dental  (+) Teeth Intact, Poor Dentition, Loose, Dental Advisory Given,    Pulmonary former smoker,    breath sounds clear to auscultation       Cardiovascular hypertension,  Rhythm:Regular Rate:Normal     Neuro/Psych    GI/Hepatic   Endo/Other  diabetes  Renal/GU      Musculoskeletal   Abdominal   Peds  Hematology   Anesthesia Other Findings   Reproductive/Obstetrics                            Anesthesia Physical Anesthesia Plan  ASA: III  Anesthesia Plan: General   Post-op Pain Management:    Induction: Intravenous  Airway Management Planned: Double Lumen EBT  Additional Equipment: CVP and Arterial line  Intra-op Plan:   Post-operative Plan: Extubation in OR  Informed Consent: I have reviewed the patients History and Physical, chart, labs and discussed the procedure including the risks, benefits and alternatives for the proposed anesthesia with the patient or authorized representative who has indicated his/her understanding and acceptance.   Dental advisory given  Plan Discussed with: CRNA and Anesthesiologist  Anesthesia Plan Comments:         Anesthesia Quick Evaluation

## 2016-04-14 NOTE — Transfer of Care (Signed)
Immediate Anesthesia Transfer of Care Note  Patient: [name] [name]  Procedure(s) Performed: Procedure(s): VIDEO ASSISTED THORACOSCOPY (VATS)/LEFT UPPER LOBECTOMY (Left)  Patient Location: PACU  Anesthesia Type:General  Level of Consciousness: awake, alert , oriented and patient cooperative  Airway & Oxygen Therapy: Patient Spontanous Breathing and Patient connected to face mask oxygen  Post-op Assessment: Report given to RN, Post -op Vital signs reviewed and stable and Patient moving all extremities X 4  Post vital signs: Reviewed and stable  Last Vitals:  Vitals:   04/14/16 1136 04/14/16 1609  BP: (!) 189/69   Pulse: 86 91  Resp: 20 (!) 9  Temp: 36.8 C 36.1 C    Last Pain:  Vitals:   04/14/16 1609  TempSrc:   PainSc: Asleep         Complications: No apparent anesthesia complications

## 2016-04-15 ENCOUNTER — Encounter (HOSPITAL_COMMUNITY): Payer: Self-pay | Admitting: Thoracic Surgery (Cardiothoracic Vascular Surgery)

## 2016-04-15 ENCOUNTER — Inpatient Hospital Stay (HOSPITAL_COMMUNITY): Payer: Medicare Other

## 2016-04-15 DIAGNOSIS — C3492 Malignant neoplasm of unspecified part of left bronchus or lung: Secondary | ICD-10-CM | POA: Diagnosis present

## 2016-04-15 LAB — BLOOD GAS, ARTERIAL
Acid-base deficit: 0.8 mmol/L (ref 0.0–2.0)
Bicarbonate: 23.8 mmol/L (ref 20.0–28.0)
Drawn by: 418751
O2 Content: 2 L/min
O2 Saturation: 90.1 %
PCO2 ART: 42.5 mmHg (ref 32.0–48.0)
PH ART: 7.367 (ref 7.350–7.450)
PO2 ART: 60.8 mmHg — AB (ref 83.0–108.0)
Patient temperature: 98.6

## 2016-04-15 LAB — BASIC METABOLIC PANEL
ANION GAP: 9 (ref 5–15)
BUN: 23 mg/dL — ABNORMAL HIGH (ref 6–20)
CHLORIDE: 106 mmol/L (ref 101–111)
CO2: 24 mmol/L (ref 22–32)
Calcium: 8.8 mg/dL — ABNORMAL LOW (ref 8.9–10.3)
Creatinine, Ser: 1.97 mg/dL — ABNORMAL HIGH (ref 0.61–1.24)
GFR calc Af Amer: 39 mL/min — ABNORMAL LOW (ref >60)
GFR, EST NON AFRICAN AMERICAN: 33 mL/min — AB (ref >60)
GLUCOSE: 188 mg/dL — AB (ref 65–99)
POTASSIUM: 4.3 mmol/L (ref 3.5–5.1)
Sodium: 139 mmol/L (ref 135–145)

## 2016-04-15 LAB — GLUCOSE, CAPILLARY
GLUCOSE-CAPILLARY: 121 mg/dL — AB (ref 65–99)
GLUCOSE-CAPILLARY: 213 mg/dL — AB (ref 65–99)
Glucose-Capillary: 205 mg/dL — ABNORMAL HIGH (ref 65–99)
Glucose-Capillary: 196 mg/dL — ABNORMAL HIGH (ref 65–99)
Glucose-Capillary: 186 mg/dL — ABNORMAL HIGH (ref 65–99)

## 2016-04-15 LAB — CBC
HCT: 37.8 % — ABNORMAL LOW (ref 39.0–52.0)
HEMOGLOBIN: 12.7 g/dL — AB (ref 13.0–17.0)
MCH: 28 pg (ref 26.0–34.0)
MCHC: 33.6 g/dL (ref 30.0–36.0)
MCV: 83.4 fL (ref 78.0–100.0)
Platelets: 233 10*3/uL (ref 150–400)
RBC: 4.53 MIL/uL (ref 4.22–5.81)
RDW: 13.4 % (ref 11.5–15.5)
WBC: 22.1 10*3/uL — ABNORMAL HIGH (ref 4.0–10.5)

## 2016-04-15 MED ORDER — AMLODIPINE BESYLATE 10 MG PO TABS
10.0000 mg | ORAL_TABLET | Freq: Every day | ORAL | Status: DC
  Administered 2016-04-15 – 2016-04-26 (×12): 10 mg via ORAL
  Filled 2016-04-15 (×12): qty 1

## 2016-04-15 MED ORDER — LUNG SURGERY BOOK
Freq: Once | Status: AC
  Administered 2016-04-15: 07:00:00
  Filled 2016-04-15: qty 1

## 2016-04-15 MED ORDER — SITAGLIPTIN PHOSPHATE 50 MG PO TABS
50.0000 mg | ORAL_TABLET | Freq: Every day | ORAL | Status: DC
  Administered 2016-04-16: 50 mg via ORAL
  Filled 2016-04-15 (×5): qty 1

## 2016-04-15 MED ORDER — LOSARTAN POTASSIUM 50 MG PO TABS
100.0000 mg | ORAL_TABLET | Freq: Every day | ORAL | Status: DC
  Administered 2016-04-15 – 2016-04-19 (×5): 100 mg via ORAL
  Filled 2016-04-15 (×6): qty 2

## 2016-04-15 MED ORDER — INSULIN ASPART 100 UNIT/ML ~~LOC~~ SOLN
4.0000 [IU] | Freq: Three times a day (TID) | SUBCUTANEOUS | Status: DC
Start: 2016-04-15 — End: 2016-04-16
  Administered 2016-04-15: 4 [IU] via SUBCUTANEOUS

## 2016-04-15 MED ORDER — GLIPIZIDE ER 10 MG PO TB24
10.0000 mg | ORAL_TABLET | Freq: Every day | ORAL | Status: DC
  Administered 2016-04-15 – 2016-04-26 (×12): 10 mg via ORAL
  Filled 2016-04-15 (×12): qty 1

## 2016-04-15 MED ORDER — INSULIN ASPART 100 UNIT/ML ~~LOC~~ SOLN: 0.0000 [IU] | Freq: Every day | SUBCUTANEOUS | Status: DC

## 2016-04-15 MED ORDER — LINAGLIPTIN 5 MG PO TABS
5.0000 mg | ORAL_TABLET | Freq: Every day | ORAL | Status: DC
  Filled 2016-04-15: qty 1

## 2016-04-15 MED ORDER — INSULIN ASPART 100 UNIT/ML ~~LOC~~ SOLN
0.0000 [IU] | Freq: Three times a day (TID) | SUBCUTANEOUS | Status: DC
  Administered 2016-04-15: 3 [IU] via SUBCUTANEOUS
  Administered 2016-04-16 (×2): 2 [IU] via SUBCUTANEOUS
  Administered 2016-04-16: 3 [IU] via SUBCUTANEOUS
  Administered 2016-04-17 (×2): 2 [IU] via SUBCUTANEOUS
  Administered 2016-04-17: 3 [IU] via SUBCUTANEOUS
  Administered 2016-04-18 – 2016-04-19 (×5): 2 [IU] via SUBCUTANEOUS
  Administered 2016-04-19: 3 [IU] via SUBCUTANEOUS
  Administered 2016-04-20: 2 [IU] via SUBCUTANEOUS
  Administered 2016-04-20 – 2016-04-21 (×3): 3 [IU] via SUBCUTANEOUS
  Administered 2016-04-21: 2 [IU] via SUBCUTANEOUS
  Administered 2016-04-22: 3 [IU] via SUBCUTANEOUS
  Administered 2016-04-22: 2 [IU] via SUBCUTANEOUS
  Administered 2016-04-23: 8 [IU] via SUBCUTANEOUS
  Administered 2016-04-23: 2 [IU] via SUBCUTANEOUS
  Administered 2016-04-24: 5 [IU] via SUBCUTANEOUS
  Administered 2016-04-25: 3 [IU] via SUBCUTANEOUS
  Administered 2016-04-26: 2 [IU] via SUBCUTANEOUS

## 2016-04-15 MED ORDER — GLIPIZIDE ER 10 MG PO TB24: 10.0000 mg | ORAL_TABLET | Freq: Every day | ORAL | Status: DC

## 2016-04-15 MED ORDER — AMLODIPINE BESYLATE 10 MG PO TABS
10.0000 mg | ORAL_TABLET | Freq: Every day | ORAL | Status: DC
  Filled 2016-04-15: qty 1

## 2016-04-15 NOTE — Discharge Instructions (Signed)
Thoracoscopy, Care After Refer to this sheet in the next few weeks. These instructions provide you with information about caring for yourself after your procedure. Your health care provider may also give you more specific instructions. Your treatment has been planned according to current medical practices, but problems sometimes occur. Call your health care provider if you have any problems or questions after your procedure. WHAT TO EXPECT AFTER THE PROCEDURE: After your procedure, it is common to feel sore for up to two weeks. HOME CARE INSTRUCTIONS  There are many different ways to close and cover an incision, including stitches (sutures), skin glue, and adhesive strips. Follow your health care provider's instructions about:  Incision care.  Bandage (dressing) changes and removal.  Incision closure removal.  Check your incision area every day for signs of infection. Watch for:  Redness, swelling, or pain.  Fluid, blood, or pus.  Take medicines only as directed by your health care provider.  Try to cough often. Coughing helps to protect against lung infection (pneumonia). It may hurt to cough. If this happens, hold a pillow against your chest when you cough.  Take deep breaths. This also helps to protect against pneumonia.  If you were given an incentive spirometer, use it as directed by your health care provider.  Do not take baths, swim, or use a hot tub until your health care provider approves. You may take showers.  Avoid lifting until your health care provider approves.  Avoid driving until your health care provider approves.  Do not travel by airplane after the chest tube is removed until your health care provider approves. SEEK MEDICAL CARE IF:  You have a fever.  Pain medicines do not ease your pain.  You have redness, swelling, or increasing pain in your incision area.  You develop a cough that does not go away, or you are coughing up mucus that is yellow or  green. SEEK IMMEDIATE MEDICAL CARE IF:  You have fluid, blood, or pus coming from your incision.  There is a bad smell coming from your incision or dressing.  You develop a rash.  You have difficulty breathing.  You cough up blood.  You develop light-headedness or you feel faint.  You develop chest pain.  Your heartbeat feels irregular or very fast.   This information is not intended to replace advice given to you by your health care provider. Make sure you discuss any questions you have with your health care provider.   Document Released: 01/08/2005 Document Revised: 07/12/2014 Document Reviewed: 03/06/2014 Elsevier Interactive Patient Education [employer].

## 2016-04-15 NOTE — Discharge Summary (Signed)
Physician Discharge Summary       [address]       [place] [address]             [phone]    [name] MRN: [id number] DOB/AGE: 02/10/1951 65 y.o.  Admit date: 04/14/2016 Discharge date: 04/26/2016  Admission Diagnoses:  Discharge Diagnoses:  Active Problems:   Adenocarcinoma of lung, stage 1, left ([place])   Procedure (s):  LEFT VIDEO ASSISTED THORACOSCOPY (VATS), THORACOSCOPIC LEFT UPPER LOBECTOMY MEDIASTINAL LYMPH NODE DISSECTION, and  ON Q LOCAL ANESTHETIC CATHETER PLACEMENT by Dr. Roxan Hockey on 04/14/2016.  Pathology:  1. Lung, resection (segmental or lobe), Left Upper Lobe - ADENOCARCINOMA, 2.2 CM - MARGINS NOT INVOLVED. - TWO BENIGN PERIBRONCHIAL LYMPH NODES (0/2) 2. Lymph node, biopsy, Level 11 - ANTHRACOTIC LYMPH NODE. - NO METASTATIC CARCINOMA IDENTIFIED. 3. Lymph node, biopsy, Level 11 #2 - ANTHRACOTIC LYMPH NODE. - NO METASTATIC CARCINOMA IDENTIFIED. 4. Lymph node, biopsy, Level 10 - ANTHRACOTIC LYMPH NODE. - NO METASTATIC CARCINOMA IDENTIFIED. 5. Lymph node, biopsy, Level 5 and 6 - ANTHRACOTIC LYMPH NODE WITH PALISADED  History of Presenting Illness: This is a 65 yo man with a history of stage IV non-Hodgkin's B cell lymphoma, hypertension, stage III CKD, type II diabetes without complication, hyperparathyroidism, and anemia. He has a remote history of ethanol abuse and tobacco abuse (1 ppd x 40 years, quit 7 years ago). He was diagnosed with B cell lymphoma in 5/16. He received 4 cycles of R CHOP. He had a complete response to treatment. He is receiving maintenance chemo every other month.    A PET CT was done in 3/17. There was a small left upper lobe nodule that was hypermetabolic with an SUV of 6.2. It was felt to be most likely inflammatory. However, on a follow up CT recently there, was a slight increase in size. He was referred for consultation. Dr. Roxan Hockey recommended a navigational bronchoscopy and that  was done 03/24/2016. He tolerated the procedure well without any issues.  Biopsies showed adenocarcinoma.Dr. Roxan Hockey had a long discussion with [name] and his daughter. He discussed the relative advantages and disadvantages of surgery and stereotactic radiation. The obvious disadvantages of surgery is pain and the major surgical procedure could lead to complications and/or death. It does, however, provide better margins as well as removing vessels and lymphatics and lymph nodes. Anatomic resection does have a better survival. Stereotactic radiation does not involve general anesthesia or incisions, but does have a higher risk of local recurrence. Dr. Roxan Hockey described the proposed operation to [name] in detail. This would be a left VATS and left upper lobectomy (possible lingular sparing). He understands it would be done in the operating room under general anesthesia. He discussed the incisions to be used, the use of drainage tubes postoperatively, the expected hospital stay, and the overall recovery. He does understand that it is not a guarantee of cure. He agreed to proceed with surgery and underwent a left VATS, thoracoscopic assisted LUL, lymph node sampling, and on Q placement on 04/14/2016.  Brief Hospital Course:  A line and foley were removed early in his post operative course. Daily chest x rays were obtained and remained stable. Chest tube output did gradually decrease.  His posterior chest tube was removed on POD #2.  There was an air leak which improved and had become intermittent.  His remaining chest tube was placed to water seal.  Repeat CXR was obtained and showed development of  an apical pneumothorax.  This increased in size in subsequent CXR and his chest tube was placed back on suction.  He was weaned off suction as air leak allowed.  CXR showed resolution of pneumothorax.  His final chest tube was removed .  He is ambulating on 2 liters of oxygen via [place] and will be weaned to  room air as tolerates. He is tolerating a diet and has had a bowel movement. His wound is clean and dry. He did have hoarseness post op which has improved and did not require intervention.  He is medically stable for discharge home today. His hoarseness will be reevaluated as an outpatient to determine if ENT consult is warranted.  Latest Vital Signs: Blood pressure 136/73, pulse 66, temperature 98 F (36.7 C), temperature source Oral, resp. rate 20, height '5\' 10"'$  (1.778 m), weight 169 lb 8.5 oz (76.9 kg), SpO2 97 %.  Physical Exam: Cardiovascular: RRR Pulmonary: Coarse breath sounds on left Abdomen: Soft, non tender, bowel sounds present. Extremities: SCDs in place Wounds: Dressing is clean and dry.    Discharge Condition:Stable and discharged to home.  Recent laboratory studies:  Lab Results  Component Value Date   WBC 13.0 (H) 04/22/2016   HGB 11.3 (L) 04/22/2016   HCT 33.7 (L) 04/22/2016   MCV 82.2 04/22/2016   PLT 251 04/22/2016   Lab Results  Component Value Date   NA 139 04/23/2016   K 3.7 04/23/2016   CL 109 04/23/2016   CO2 26 04/23/2016   CREATININE 1.93 (H) 04/23/2016   GLUCOSE 83 04/23/2016    Diagnostic Studies:  EXAM: PORTABLE CHEST 1 VIEW  COMPARISON:  Chest x-rays dated 04/15/2016 and 04/14/2016.  FINDINGS: Heart size and mediastinal contours are stable. Atherosclerotic calcifications again noted at the aortic arch. Study is hypoinspiratory with crowding of the perihilar and bibasilar bronchovascular markings. Given the low lung volumes, lungs appear clear. No pneumothorax seen.  Two left-sided chest tubes are stable in position with tips directed towards the lung apex. Right chest wall Port-A-Cath appears stable with distal tip overlying the lower SVC. Left subclavian catheter is stable in position with tip at the level of the upper SVC.  IMPRESSION: Stable chest x-ray. Low lung volumes. No pneumothorax seen. Left-sided chest tubes stable in  position with tips directed towards the lung apex.  Aortic atherosclerosis.  Ct Super D Chest Wo Contrast  Result Date: 03/24/2016 CLINICAL DATA:  Follow-up left lung nodule. Diffuse large B-cell lymphoma, status post chemotherapy. EXAM: CT CHEST WITHOUT CONTRAST TECHNIQUE: Multidetector CT imaging of the chest was performed using thin slice collimation for electromagnetic bronchoscopy planning purposes, without intravenous contrast. COMPARISON:  CT chest dated 03/01/2016.  PET-CT dated 09/09/2015. FINDINGS: Cardiovascular: The heart is normal in size. No pericardial effusion. Atherosclerotic calcifications of the aortic arch. Right chest port terminates at the cavoatrial junction. Mediastinum/Nodes: Mild residual thymic tissue along the anterior mediastinum (series 3/ image 55). No suspicious mediastinal lymphadenopathy. Status post right axillary lymph node dissection. Visualized thyroid is unremarkable. Lungs/Pleura: 2.5 x 1.8 cm subsolid left upper lobe pulmonary nodule (series 4/image 38), previously 2.4 x 1.5 cm, mildly increased. This was FDG avid on prior PET. Moderate centrilobular and paraseptal emphysematous changes. No focal consolidation. Mild compressive atelectasis in the bilateral lower lobes. No pleural effusion or pneumothorax. Upper Abdomen: Visualized upper abdomen is notable for vascular calcifications. Musculoskeletal: Mild degenerative changes of the visualized thoracolumbar spine. IMPRESSION: 2.5 x 1.8 cm subsolid left upper lobe pulmonary nodule, mildly increased, FDG  avid on prior PET. This remains suspicious for primary bronchogenic neoplasm or possibly metastasis given rapid growth. Consider percutaneous consider bronchoscopy or percutaneous sampling. Electronically Signed   By: Julian Hy M.D.   On: 03/24/2016 09:14    Discharge Medications:   Medication List    STOP taking these medications   hydrochlorothiazide 25 MG tablet Commonly known as:  HYDRODIURIL     losartan 100 MG tablet Commonly known as:  COZAAR     TAKE these medications   amLODipine 10 MG tablet Commonly known as:  NORVASC TAKE ONE TABLET BY MOUTH ONCE DAILY What changed:  how much to take  how to take this  when to take this  additional instructions   aspirin 81 MG tablet Take 81 mg by mouth daily.   fish oil-omega-3 fatty acids 1000 MG capsule Take 1 g by mouth daily.   glipiZIDE 10 MG 24 hr tablet Commonly known as:  GLUCOTROL XL TAKE ONE TABLET BY MOUTH ONCE DAILY What changed:  how much to take  how to take this  when to take this  additional instructions   glucose blood test strip Commonly known as:  ACCU-CHEK AVIVA PLUS Use as instructed   metoprolol tartrate 25 MG tablet Commonly known as:  LOPRESSOR Take 1 tablet (25 mg total) by mouth 2 (two) times daily.   oxyCODONE 5 MG immediate release tablet Commonly known as:  Oxy IR/ROXICODONE Take 1-2 tablets (5-10 mg total) by mouth every 6 (six) hours as needed for severe pain.   pravastatin 40 MG tablet Commonly known as:  PRAVACHOL TAKE ONE TABLET BY MOUTH ONCE DAILY What changed:  how much to take  how to take this  when to take this  additional instructions   sitaGLIPtin 50 MG tablet Commonly known as:  JANUVIA Take 1 tablet (50 mg total) by mouth daily.       Follow Up Appointments: Follow-up Information    Melrose Nakayama, MD Follow up on 05/04/2016.   Specialty:  Cardiothoracic Surgery Why:  PA/LAT CXR to be taken (at [facility] which is in the same building as Dr. Leonarda Salon office) on 05/04/2016 at 11:00 am;Appointment time is at 11:30 am Contact information: [address] [address] [id number] [phone]           Signed: Macy Mis 04/26/2016, 8:31 AM

## 2016-04-15 NOTE — Op Note (Signed)
NAMEMarland Kitchen  LAMOINE, MAGALLON NO.:  [id number]  MEDICAL RECORD NO.:  [id number]  LOCATION:  3S02C                        FACILITY:  [facility]  PHYSICIAN:  Revonda Standard. Roxan Hockey, M.D.DATE OF BIRTH:  1950/10/15  DATE OF PROCEDURE:  04/14/2016 DATE OF DISCHARGE:                              OPERATIVE REPORT   PREOPERATIVE DIAGNOSIS:  Adenocarcinoma,left upper lobe, clinical stage 1A.  POSTOPERATIVE DIAGNOSIS: Adenocarcinoma,left upper lobe, clinical stage 1A.  PROCEDURE:   Left video-assisted thoracoscopy,  Thoracoscopic left upper lobectomy, Mediastinal lymph node dissection, On-Q local anesthetic catheter placement.  SURGEON:  Revonda Standard. Roxan Hockey, M.D.  ASSISTANT:  Lars Pinks, PA.  ANESTHESIA:  General.  FINDINGS:  No abnormality of visceral or parietal pleura.  No pleural effusion.  Bronchial margin negative for negative for tumor.  CLINICAL NOTE:  Mr. Hank is a 65 year old gentleman with a history of non-Hodgkin's lymphoma and remote history of tobacco abuse.  Recently, he had a CT of the chest, which showed a small left upper lobe nodule. This nodule was hypermetabolic and was followed.  Over time, there was a slight increase in size.  I did a navigational bronchoscopy on March 24, 2016, which showed adenocarcinoma.  The patient was advised to undergo a left upper lobectomy, possibly lingular sparing if technically feasible, for definitive treatment of the adenocarcinoma of the lung.  The indications, risks, benefits, and alternatives were discussed in detail with the patient.  He understood and accepted the risks and agreed to proceed.  OPERATIVE NOTE:  Mr. Luginbill was brought to the preoperative holding area on April 14, 2016.  Anesthesia placed a central line and arterial blood pressure monitoring line.  He was taken to the operating room, anesthetized, and intubated with a double-lumen endotracheal tube. Intravenous antibiotics were  administered.  A Foley catheter was placed. Sequential compression devices were placed on the calves for DVT prophylaxis.  He was placed in a right lateral decubitus position and the left chest was prepped and draped in the usual sterile fashion. Single lung ventilation the right lung was initiated and was tolerated well throughout the procedure.  An incision was made in the seventh intercostal space in the midaxillary line.  A 5 mm port was inserted into the chest.  There was good isolation of the left lung.  There was no abnormality of the visceral or parietal pleura.  A 5 cm working incision was made in the fourth interspace anterolaterally.  No rib spreading was performed during the procedure.  There was good visualization with the scope throughout.  The inferior ligament was divided with the Harmonic Scalpel.  No significant lymph node was encountered.  The pleural reflection was divided anteriorly.  Care was taken to avoid the area of the phrenic nerve. This was not in close proximity.  The fissure between the lingula and lower lobe was relatively complete.  This was divided with electrocautery.  The lingular branches of the upper lobe vein were identified as were the lingular arterial branches.  The nodule was not palpable within the lung, and it was felt that proceeding with a full upper lobectomy would be beneficial in terms of margin.  The lingular arterial branches were encircled  and divided with the endoscopic vascular stapler.  The lingular vein branches were also divided with the endoscopic vascular stapler.  The remainder of the superior pulmonary vein branches then were dissected out.  This was a relatively slow dissection due to surrounding structures, but they were encircled and divided with the vascular stapler. Attention then was turned back to the fissure and the dissection was continued along the pulmonary artery and the fissure.  The fissure was completed with  sequential firings of the Echelon 45-mm stapler with gold cartridges.  The upper lobe arterial branches then were identifiable.  There were 2 large branches, both of which bifurcated shortly after the origin.  The more posterior branch was dissected out, encircled, and divided via the fissure and the more anterior branch was divided from anterior to posterior.  Both of these vessels were divided with the endoscopic stapler.  All lymph nodes that were encountered during the dissection were removed and sent for permanent pathology as separate specimens.  The bronchus now was clearly visible.  The endoscopic stapler with a green cartridge was placed across the bronchus and closed.  A test inflation showed good aeration of the lower lobe.  The stapler then was fired transecting the bronchus. The chest was filled with warm saline.  A test inflation to 30 cm of water revealed no air leakage from the bronchial stump.  There was some minor air leakage from the fissure.  This specimen was sent for frozen section of the bronchial margin.  The mass had previously been confirmed to be adenocarcinoma on bronchoscopy. The frozen section returned showing no tumor at the bronchial margin.  The subcarinal space was explored.  No significant nodal tissue was found.  The aortopulmonary window was opened and multiple relatively large, but otherwise benign appearing aortopulmonary nodes were removed using the Harmonic Scalpel.  An On-Q local anesthetic catheter was placed through a separate stab incision posteriorly and tunneled into a subpleural location.  It was primed with 5 mL of 0.5% Marcaine.  It was secured to the skin with a 3-0 silk suture.  A 28-French Blake drain was placed through the original port incision and directed posteriorly into the apex.  A 28-French chest tube was placed through a separate incision anterior to that and directed anteriorly to the apex.  The lower lobe was reinflated.  The  incision was closed in 3 layers.  All sponge, needle, and instrument counts were correct at the end of the procedure. The patient was placed back in a supine position.  Chest tubes were placed to suction.  He was extubated in the operating room and taken to the postanesthetic care unit in good condition.     Revonda Standard Roxan Hockey, M.D.     SCH/MEDQ  D:  04/14/2016  T:  04/15/2016  Job:  [id number]

## 2016-04-15 NOTE — Progress Notes (Addendum)
      [address]       [place] [address]             [phone]        1 Day Post-Op Procedure(s) (LRB): VIDEO ASSISTED THORACOSCOPY (VATS)/LEFT UPPER LOBECTOMY (Left)  Subjective: Patient with no specific complaints this am.  Objective: Vital signs in last 24 hours: Temp:  [97 F (36.1 C)-98.2 F (36.8 C)] 98.2 F (36.8 C) (10/12 0700) Pulse Rate:  [71-91] 82 (10/12 0700) Cardiac Rhythm: Normal sinus rhythm (10/12 0700) Resp:  [9-21] 21 (10/12 0700) BP: (117-189)/(59-82) 143/75 (10/12 0700) SpO2:  [94 %-100 %] 100 % (10/12 0700) Arterial Line BP: (134-195)/(5-77) 187/69 (10/12 0600) Weight:  [164 lb (74.4 kg)-169 lb 8.5 oz (76.9 kg)] 169 lb 8.5 oz (76.9 kg) (10/11 2031)   Current Weight  04/14/16 169 lb 8.5 oz (76.9 kg)       Intake/Output from previous day: 10/11 0701 - 10/12 0700 In: 2400.8 [I.V.:2300.8; IV Piggyback:100] Out: 2695 [Urine:2380; Blood:50; Chest Tube:265]   Physical Exam:  Cardiovascular: RRR Pulmonary: Coarse breath sounds on left Abdomen: Soft, non tender, bowel sounds present. Extremities: SCDs in place Wounds: Dressing is clean and dry.   Lab Results: CBC: Recent Labs  04/12/16 1305 04/15/16 0500  WBC 11.9* 22.1*  HGB 13.4 12.7*  HCT 38.9* 37.8*  PLT 232 233   BMET:  Recent Labs  04/12/16 1305 04/15/16 0500  NA 139 139  K 4.0 4.3  CL 107 106  CO2 22 24  GLUCOSE 118* 188*  BUN 29* 23*  CREATININE 2.06* 1.97*  CALCIUM 9.1 8.8*    PT/INR:  Lab Results  Component Value Date   INR 1.00 04/12/2016   INR 0.99 03/22/2016   ABG:  INR: Will add last result for INR, ABG once components are confirmed Will add last 4 CBG results once components are confirmed  Assessment/Plan:  1. CV - SR in the 90's. On Norvasc 10 mg as taken pre op. 2.  Pulmonary - On 2 liters of oxygen via [place]. Wean as tolerates. Chest tube with 265 cc of output since surgery. Chest tubes are to suction. There is an air leak with  cough. CXR this am appears to show no pneumothorax. Encourage incentive spirometer. 3. CKD-stage III.Creatinine decreased from 2.06 to 1.97. Appears baseline around 2 4. Remove a line 5. Decrease IVF 6. DM-CBGs 258/299/205. On Insulin PRN. On Januvia 50 mg daily and Glipizide XL 10 mg daily pre op.  Will restart Januvia today, and Glipizde in am. Pre op HGA1C 6.9.  ZIMMERMAN,DONIELLE MPA-C 04/15/2016,8:22 AM Patient seen and examined, agree with above He is having minimal pain and denies nausea Voice is very hoarse. Hopefully just from tube but left recurrent nerve is a concern after LN dissection  Remo Lipps C. Roxan Hockey, MD [facility] [phone]

## 2016-04-16 ENCOUNTER — Inpatient Hospital Stay (HOSPITAL_COMMUNITY): Payer: Medicare Other

## 2016-04-16 LAB — CBC
HCT: 36.9 % — ABNORMAL LOW (ref 39.0–52.0)
Hemoglobin: 12.6 g/dL — ABNORMAL LOW (ref 13.0–17.0)
MCH: 28.3 pg (ref 26.0–34.0)
MCHC: 34.1 g/dL (ref 30.0–36.0)
MCV: 82.7 fL (ref 78.0–100.0)
PLATELETS: 206 10*3/uL (ref 150–400)
RBC: 4.46 MIL/uL (ref 4.22–5.81)
RDW: 13.4 % (ref 11.5–15.5)
WBC: 18.1 10*3/uL — ABNORMAL HIGH (ref 4.0–10.5)

## 2016-04-16 LAB — COMPREHENSIVE METABOLIC PANEL
ALBUMIN: 3.3 g/dL — AB (ref 3.5–5.0)
ALT: 24 U/L (ref 17–63)
ANION GAP: 8 (ref 5–15)
AST: 24 U/L (ref 15–41)
Alkaline Phosphatase: 52 U/L (ref 38–126)
BILIRUBIN TOTAL: 0.4 mg/dL (ref 0.3–1.2)
BUN: 20 mg/dL (ref 6–20)
CO2: 25 mmol/L (ref 22–32)
Calcium: 8.7 mg/dL — ABNORMAL LOW (ref 8.9–10.3)
Chloride: 106 mmol/L (ref 101–111)
Creatinine, Ser: 1.97 mg/dL — ABNORMAL HIGH (ref 0.61–1.24)
GFR calc Af Amer: 39 mL/min — ABNORMAL LOW (ref >60)
GFR calc non Af Amer: 33 mL/min — ABNORMAL LOW (ref >60)
GLUCOSE: 140 mg/dL — AB (ref 65–99)
POTASSIUM: 3.9 mmol/L (ref 3.5–5.1)
SODIUM: 139 mmol/L (ref 135–145)
TOTAL PROTEIN: 5.8 g/dL — AB (ref 6.5–8.1)

## 2016-04-16 LAB — GLUCOSE, CAPILLARY
GLUCOSE-CAPILLARY: 150 mg/dL — AB (ref 65–99)
GLUCOSE-CAPILLARY: 130 mg/dL — AB (ref 65–99)
Glucose-Capillary: 193 mg/dL — ABNORMAL HIGH (ref 65–99)
Glucose-Capillary: 190 mg/dL — ABNORMAL HIGH (ref 65–99)

## 2016-04-16 LAB — TYPE AND SCREEN
ABO/RH(D): O POS
ANTIBODY SCREEN: NEGATIVE
UNIT DIVISION: 0
UNIT DIVISION: 0

## 2016-04-16 MED ORDER — INSULIN DETEMIR 100 UNIT/ML ~~LOC~~ SOLN
20.0000 [IU] | Freq: Every day | SUBCUTANEOUS | Status: DC
  Filled 2016-04-16: qty 0.2

## 2016-04-16 MED ORDER — HYDROCHLOROTHIAZIDE 12.5 MG PO CAPS
25.0000 mg | ORAL_CAPSULE | Freq: Every day | ORAL | Status: DC
  Administered 2016-04-16 – 2016-04-19 (×4): 25 mg via ORAL
  Filled 2016-04-16 (×4): qty 2

## 2016-04-16 NOTE — Care Management Note (Addendum)
Case Management Note  Patient Details  Name: [name] [name] MRN: [id number] Date of Birth: Dec 02, 1950  Subjective/Objective:   S/p VATS , lobectomy, pta indep, lives with sister, he has a pcp, he has medication coverage and he has transportation at discharge.   NCM will cont to follow for dc needs.               Action/Plan:   Expected Discharge Date:  04/19/16               Expected Discharge Plan:  Home/Self Care  In-House Referral:     Discharge planning Services  CM Consult  Post Acute Care Choice:    Choice offered to:     DME Arranged:    DME Agency:     HH Arranged:    HH Agency:     Status of Service:  Completed, signed off  If discussed at [employer] of Stay Meetings, dates discussed:    Additional Comments:  Zenon Mayo, RN 04/16/2016, 3:42 PM

## 2016-04-16 NOTE — Progress Notes (Signed)
Inpatient Diabetes Program Recommendations  AACE/ADA: New Consensus Statement on Inpatient Glycemic Control (2015)  Target Ranges:  Prepandial:   less than 140 mg/dL      Peak postprandial:   less than 180 mg/dL (1-2 hours)      Critically ill patients:  140 - 180 mg/dL   Lab Results  Component Value Date   GLUCAP 150 (H) 04/16/2016   HGBA1C 6.9 (H) 04/12/2016    Review of Glycemic Control  Results for DAESHON, GRAMMATICO (MRN [id number]) as of 04/16/2016 13:42  Ref. Range 04/15/2016 17:48 04/15/2016 20:13 04/15/2016 22:37 04/16/2016 07:57 04/16/2016 12:35  Glucose-Capillary Latest Ref Range: 65 - 99 mg/dL 121 (H) 213 (H) 186 (H) 193 (H) 150 (H)    Diabetes history: Type 2 Outpatient Diabetes medications: Glipizide '10mg'$ /day, Januvia '50mg'$ /day Current orders for Inpatient glycemic control: Glipizide '10mg'$ /day, Januvia '50mg'$ /day, Novolog 0-15 units tid, Novolog 0-5 units qhs  Inpatient Diabetes Program Recommendations:  Consider d/c Glipizide while patient is in the hospital- high risk for hypoglycemia if diet is interrupted or patient needs to be NPO for any reason.    Continue Januvia and Novolog correction as ordered.   Gentry Fitz, RN, BA, MHA, CDE Diabetes Coordinator Inpatient Diabetes Program  [phone] (Team Pager) [phone] ([place]) 04/16/2016 1:59 PM

## 2016-04-16 NOTE — Progress Notes (Addendum)
      [address]       [place] [address]             [phone]        2 Days Post-Op Procedure(s) (LRB): VIDEO ASSISTED THORACOSCOPY (VATS)/LEFT UPPER LOBECTOMY (Left)  Subjective: Patient still with hoarseness  Objective: Vital signs in last 24 hours: Temp:  [98 F (36.7 C)-99 F (37.2 C)] 99 F (37.2 C) (10/13 0320) Pulse Rate:  [81-106] 87 (10/13 0600) Cardiac Rhythm: Normal sinus rhythm (10/13 0320) Resp:  [14-30] 25 (10/13 0600) BP: (132-154)/(65-87) 132/71 (10/13 0400) SpO2:  [93 %-99 %] 97 % (10/13 0600)   Current Weight  04/14/16 169 lb 8.5 oz (76.9 kg)       Intake/Output from previous day: 10/12 0701 - 10/13 0700 In: 589.2 [I.V.:589.2] Out: 2245 [Urine:2125; Chest Tube:120]   Physical Exam:  Cardiovascular: RRR Pulmonary: Diminished at left base, right is clear Abdomen: Soft, non tender, bowel sounds present. Extremities: SCDs in place Wounds: Cean and dry. Chest tube sites examined-sutures intact and tight and all connections together. Chest tubes: to suction, air leak that worsens with cough  Lab Results: CBC:  Recent Labs  04/15/16 0500 04/16/16 0423  WBC 22.1* 18.1*  HGB 12.7* 12.6*  HCT 37.8* 36.9*  PLT 233 206   BMET:   Recent Labs  04/15/16 0500 04/16/16 0423  NA 139 139  K 4.3 3.9  CL 106 106  CO2 24 25  GLUCOSE 188* 140*  BUN 23* 20  CREATININE 1.97* 1.97*  CALCIUM 8.8* 8.7*    PT/INR:  Lab Results  Component Value Date   INR 1.00 04/12/2016   INR 0.99 03/22/2016   ABG:  INR: Will add last result for INR, ABG once components are confirmed Will add last 4 CBG results once components are confirmed  Assessment/Plan:  1. CV - SR in the 90's. Hypertensive.On Norvasc 10 mg as taken pre op. Will restart HCTZ 25 mg daily. 2.  Pulmonary - On 2 liters of oxygen via . Wean as tolerates. Chest tubes with 120 cc of output last 24 hours. Chest tubes are to suction. There is an air leak that worsens  with cough. CXR this am appears to show no pneumothorax and atlectasis. Encourage incentive spirometer and flutter valve. 3. CKD-stage III.Creatinine stable at 1.97. Appears baseline around 2 4. DM-CBGs 121/213/186. On Januvia 50 mg daily and Glipizide XL 10 mg daily pre op.  Restarted on Januvia yesterday, and will restart Glipizde this am. Pre op HGA1C 6.9. 5. Patient not using PCA much but will leave for now as with 2 pleural tubes 6. Regarding hoarseness, etiology either from ET tube or injury to left recurrent nerve during lymph node sampling. Monitor  ZIMMERMAN,DONIELLE MPA-C 04/16/2016,7:49 AM Patient seen and examined, agree with above Minimal drainage so will dc posterior CT Keep anterior tube to suction today Will add levemir as CBG persistently elevated Needs to ambulate Continue SCD + enoxaparin  Remo Lipps C. Roxan Hockey, MD [facility] [phone]

## 2016-04-17 ENCOUNTER — Inpatient Hospital Stay (HOSPITAL_COMMUNITY): Payer: Medicare Other

## 2016-04-17 LAB — GLUCOSE, CAPILLARY
Glucose-Capillary: 144 mg/dL — ABNORMAL HIGH (ref 65–99)
Glucose-Capillary: 198 mg/dL — ABNORMAL HIGH (ref 65–99)
Glucose-Capillary: 127 mg/dL — ABNORMAL HIGH (ref 65–99)
Glucose-Capillary: 114 mg/dL — ABNORMAL HIGH (ref 65–99)

## 2016-04-17 MED ORDER — SITAGLIPTIN PHOSPHATE 100 MG PO TABS
100.0000 mg | ORAL_TABLET | Freq: Every day | ORAL | Status: DC
  Administered 2016-04-17 – 2016-04-20 (×4): 100 mg via ORAL
  Filled 2016-04-17 (×5): qty 1

## 2016-04-17 MED ORDER — METOPROLOL TARTRATE 25 MG PO TABS
25.0000 mg | ORAL_TABLET | Freq: Two times a day (BID) | ORAL | Status: DC
  Administered 2016-04-17 – 2016-04-26 (×19): 25 mg via ORAL
  Filled 2016-04-17 (×19): qty 1

## 2016-04-17 NOTE — Progress Notes (Addendum)
[address]       [place] [address]             [phone]      3 Days Post-Op Procedure(s) (LRB): VIDEO ASSISTED THORACOSCOPY (VATS)/LEFT UPPER LOBECTOMY (Left) Subjective: Says hoarseness is improving. Feeling pretty well  Objective: Vital signs in last 24 hours: Temp:  [97.6 F (36.4 C)-98.8 F (37.1 C)] 97.9 F (36.6 C) (10/14 0313) Pulse Rate:  [80-104] 85 (10/14 0600) Cardiac Rhythm: (P) Normal sinus rhythm (10/14 0745) Resp:  [14-28] 22 (10/14 0800) BP: (132-151)/(78-82) 140/82 (10/14 0313) SpO2:  [98 %-100 %] 100 % (10/14 0800)  Hemodynamic parameters for last 24 hours:    Intake/Output from previous day: 10/13 0701 - 10/14 0700 In: 1477.8 [P.O.:1200; I.V.:277.8] Out: 9937 [Urine:1025; Chest Tube:230] Intake/Output this shift: No intake/output data recorded.  General appearance: alert, cooperative and no distress Heart: regular rate and rhythm and tachy Lungs: dim in left>right base Abdomen: benign Extremities: no edema Wound: incis ok, some serous drainage on dressing  Lab Results:  Recent Labs  04/15/16 0500 04/16/16 0423  WBC 22.1* 18.1*  HGB 12.7* 12.6*  HCT 37.8* 36.9*  PLT 233 206   BMET:  Recent Labs  04/15/16 0500 04/16/16 0423  NA 139 139  K 4.3 3.9  CL 106 106  CO2 24 25  GLUCOSE 188* 140*  BUN 23* 20  CREATININE 1.97* 1.97*  CALCIUM 8.8* 8.7*    PT/INR: No results for input(s): LABPROT, INR in the last 72 hours. ABG    Component Value Date/Time   PHART 7.367 04/15/2016 0500   HCO3 23.8 04/15/2016 0500   TCO2 16 11/23/2014 0314   ACIDBASEDEF 0.8 04/15/2016 0500   O2SAT 90.1 04/15/2016 0500   CBG (last 3)   Recent Labs  04/16/16 1612 04/16/16 2151 04/17/16 0753  GLUCAP 130* 190* 144*    Meds Scheduled Meds: . acetaminophen  1,000 mg Oral Q6H   Or  . acetaminophen (TYLENOL) oral liquid 160 mg/5 mL  1,000 mg Oral Q6H  . amLODipine  10 mg Oral Daily  . aspirin EC  81 mg Oral Daily  .  bisacodyl  10 mg Oral Daily  . enoxaparin (LOVENOX) injection  40 mg Subcutaneous Q24H  . fentaNYL   Intravenous Q4H  . glipiZIDE  10 mg Oral Daily  . hydrochlorothiazide  25 mg Oral Daily  . insulin aspart  0-15 Units Subcutaneous TID WC  . insulin aspart  0-5 Units Subcutaneous QHS  . losartan  100 mg Oral Daily  . pantoprazole  40 mg Oral Daily  . pravastatin  40 mg Oral Daily  . senna-docusate  1 tablet Oral QHS  . sitaGLIPtin  50 mg Oral Daily   Continuous Infusions: . sodium chloride 10 mL/hr at 04/17/16 0600   PRN Meds:.diphenhydrAMINE **OR** diphenhydrAMINE, naloxone **AND** sodium chloride flush, ondansetron (ZOFRAN) IV, oxyCODONE, potassium chloride, traMADol  Xrays Dg Chest Port 1 View  Result Date: 04/17/2016 CLINICAL DATA:  Chest tube. EXAM: PORTABLE CHEST 1 VIEW COMPARISON:  04/16/2016 FINDINGS: Interval removal of 1 left chest tube. Medial left-sided chest tube remains. Small apical left pneumothorax is evident. Right Port-A-Cath is stable. Left subclavian central line tip overlies the innominate vein confluence and is directed laterally, as before. Lung volumes are low with bibasilar atelectasis. Cardiopericardial silhouette is stable. The visualized bony structures of the thorax are intact. Telemetry leads overlie the chest. IMPRESSION: 1. Interval removal of 1 of the 2 left  chest tubes with small left apical pneumothorax evident. 2. Left subclavian central line tip remains at the innominate vein confluence, directed laterally. 3. Low volume film with persistent basilar atelectasis bilaterally Electronically Signed   By: Misty Stanley M.D.   On: 04/17/2016 07:49   Dg Chest Port 1 View  Result Date: 04/16/2016 CLINICAL DATA:  Chest tube EXAM: PORTABLE CHEST 1 VIEW COMPARISON:  Chest x-rays dated 04/15/2016 and 04/14/2016. FINDINGS: Heart size and mediastinal contours are stable. Atherosclerotic calcifications again noted at the aortic arch. Study is hypoinspiratory with  crowding of the perihilar and bibasilar bronchovascular markings. Given the low lung volumes, lungs appear clear. No pneumothorax seen. Two left-sided chest tubes are stable in position with tips directed towards the lung apex. Right chest wall Port-A-Cath appears stable with distal tip overlying the lower SVC. Left subclavian catheter is stable in position with tip at the level of the upper SVC. IMPRESSION: Stable chest x-ray. Low lung volumes. No pneumothorax seen. Left-sided chest tubes stable in position with tips directed towards the lung apex. Aortic atherosclerosis. Electronically Signed   By: Franki Cabot M.D.   On: 04/16/2016 08:11    Chest tube+ air leak, 200 ml drainage yesterday   Assessment/Plan: S/P Procedure(s) (LRB): VIDEO ASSISTED THORACOSCOPY (VATS)/LEFT UPPER LOBECTOMY (Left)  1 keep chest tube with air leak 2 routine pulm toilet 3 will add beta blocker to help with tachy/BP control 4 cont current DM meds- increase Januvia dose,  needs improved dietary control  of carbs/sugars long term 5 hoarseness improving  Significant airleak through chest tube Chest x-ray shows lung expanded so will reduce suction to 15 cm water and recheck x-ray in a.m. Patient encouraged to maximize his nutrition patient examined and medical record reviewed,agree with above note. Tharon Aquas Trigt III 04/17/2016     LOS: 3 days    GOLD,WAYNE E 04/17/2016

## 2016-04-18 ENCOUNTER — Inpatient Hospital Stay (HOSPITAL_COMMUNITY): Payer: Medicare Other

## 2016-04-18 LAB — GLUCOSE, CAPILLARY
GLUCOSE-CAPILLARY: 122 mg/dL — AB (ref 65–99)
GLUCOSE-CAPILLARY: 122 mg/dL — AB (ref 65–99)
Glucose-Capillary: 179 mg/dL — ABNORMAL HIGH (ref 65–99)
Glucose-Capillary: 156 mg/dL — ABNORMAL HIGH (ref 65–99)

## 2016-04-18 NOTE — Progress Notes (Addendum)
[address]       [place] [address]             [phone]      4 Days Post-Op Procedure(s) (LRB): VIDEO ASSISTED THORACOSCOPY (VATS)/LEFT UPPER LOBECTOMY (Left) Subjective: Feels pretty good, remains hoarse  Objective: Vital signs in last 24 hours: Temp:  [98.1 F (36.7 C)-98.7 F (37.1 C)] 98.7 F (37.1 C) (10/15 1100) Pulse Rate:  [69-96] 83 (10/15 1100) Cardiac Rhythm: Normal sinus rhythm (10/15 0900) Resp:  [17-25] 17 (10/15 1300) BP: (112-138)/(62-74) 112/62 (10/15 1100) SpO2:  [98 %-100 %] 99 % (10/15 1300)  Hemodynamic parameters for last 24 hours:    Intake/Output from previous day: 10/14 0701 - 10/15 0700 In: 120 [I.V.:120] Out: -  Intake/Output this shift: Total I/O In: 550 [P.O.:480; I.V.:70] Out: 650 [Urine:650]  General appearance: alert, cooperative and no distress Heart: regular rate and rhythm Lungs: dim in bases Abdomen: benign Extremities: warm, no edema Wound: incis healing well  Lab Results:  Recent Labs  04/16/16 0423  WBC 18.1*  HGB 12.6*  HCT 36.9*  PLT 206   BMET:  Recent Labs  04/16/16 0423  NA 139  K 3.9  CL 106  CO2 25  GLUCOSE 140*  BUN 20  CREATININE 1.97*  CALCIUM 8.7*    PT/INR: No results for input(s): LABPROT, INR in the last 72 hours. ABG    Component Value Date/Time   PHART 7.367 04/15/2016 0500   HCO3 23.8 04/15/2016 0500   TCO2 16 11/23/2014 0314   ACIDBASEDEF 0.8 04/15/2016 0500   O2SAT 90.1 04/15/2016 0500   CBG (last 3)   Recent Labs  04/17/16 2206 04/18/16 0742 04/18/16 1223  GLUCAP 114* 122* 122*    Meds Scheduled Meds: . acetaminophen  1,000 mg Oral Q6H   Or  . acetaminophen (TYLENOL) oral liquid 160 mg/5 mL  1,000 mg Oral Q6H  . amLODipine  10 mg Oral Daily  . aspirin EC  81 mg Oral Daily  . bisacodyl  10 mg Oral Daily  . enoxaparin (LOVENOX) injection  40 mg Subcutaneous Q24H  . fentaNYL   Intravenous Q4H  . glipiZIDE  10 mg Oral Daily  .  hydrochlorothiazide  25 mg Oral Daily  . insulin aspart  0-15 Units Subcutaneous TID WC  . insulin aspart  0-5 Units Subcutaneous QHS  . losartan  100 mg Oral Daily  . metoprolol tartrate  25 mg Oral BID  . pantoprazole  40 mg Oral Daily  . pravastatin  40 mg Oral Daily  . senna-docusate  1 tablet Oral QHS  . sitaGLIPtin  100 mg Oral Daily   Continuous Infusions: . sodium chloride 10 mL/hr at 04/18/16 1300   PRN Meds:.diphenhydrAMINE **OR** diphenhydrAMINE, naloxone **AND** sodium chloride flush, ondansetron (ZOFRAN) IV, oxyCODONE, potassium chloride, traMADol  Xrays Dg Chest Port 1 View  Result Date: 04/18/2016 CLINICAL DATA:  Chest tube in place.  Pneumothorax. EXAM: PORTABLE CHEST 1 VIEW COMPARISON:  One-view chest x-ray 04/17/2016 FINDINGS: A left chest tube remains in place. There is no definite pneumothorax on this semi upright film. Lung volumes remain low with slight improved aeration. The right subclavian Port-A-Cath is stable. A left subclavian line is stable. IMPRESSION: 1. Left chest tube remains in place without significant pneumothorax. 2. Slightly improved aeration in both lungs. 3. Support apparatus is otherwise stable. Electronically Signed   By: San Morelle M.D.   On: 04/18/2016 08:15   Dg Chest [facility]  1 View  Result Date: 04/17/2016 CLINICAL DATA:  Chest tube. EXAM: PORTABLE CHEST 1 VIEW COMPARISON:  04/16/2016 FINDINGS: Interval removal of 1 left chest tube. Medial left-sided chest tube remains. Small apical left pneumothorax is evident. Right Port-A-Cath is stable. Left subclavian central line tip overlies the innominate vein confluence and is directed laterally, as before. Lung volumes are low with bibasilar atelectasis. Cardiopericardial silhouette is stable. The visualized bony structures of the thorax are intact. Telemetry leads overlie the chest. IMPRESSION: 1. Interval removal of 1 of the 2 left chest tubes with small left apical pneumothorax evident. 2. Left  subclavian central line tip remains at the innominate vein confluence, directed laterally. 3. Low volume film with persistent basilar atelectasis bilaterally Electronically Signed   By: Misty Stanley M.D.   On: 04/17/2016 07:49    Chest Tube- + air leak  Assessment/Plan: S/P Procedure(s) (LRB): VIDEO ASSISTED THORACOSCOPY (VATS)/LEFT UPPER LOBECTOMY (Left)  1  CXR with improving aeration and no pntx- + air leak , keep CT to suction 2 CBG's improved 3 HR improved/hemodyn stable Off O2 4 cont rehab  5 d/c onq     LOS: 4 days    GOLD,WAYNE E 04/18/2016 Suction at 10 cm H2O Air leak persists, small apical pneumo patient examined and medical record reviewed,agree with above note. Tharon Aquas Trigt III 04/18/2016

## 2016-04-19 ENCOUNTER — Inpatient Hospital Stay (HOSPITAL_COMMUNITY): Payer: Medicare Other

## 2016-04-19 LAB — GLUCOSE, CAPILLARY
GLUCOSE-CAPILLARY: 144 mg/dL — AB (ref 65–99)
GLUCOSE-CAPILLARY: 174 mg/dL — AB (ref 65–99)
GLUCOSE-CAPILLARY: 146 mg/dL — AB (ref 65–99)
Glucose-Capillary: 135 mg/dL — ABNORMAL HIGH (ref 65–99)

## 2016-04-19 NOTE — Progress Notes (Addendum)
Attempted to call dr. Roxan Hockey to report worsening l pneumothorax result. Was forwarded to [facility] phone. Left message to return call.   1010- spoke to ryan. She will speak to dr hendrickson who is in or.

## 2016-04-19 NOTE — Progress Notes (Signed)
      [address]       [place] [address]             [phone]      CXR on water seal showed left pneumothorax. He is tolerating it well without complaints. Small air leak with cough.  Will observe on water seal overnight and recheck CXR in AM  If any respiratory issues will place back to suction  Remo Lipps C. Roxan Hockey, MD [facility] [phone]

## 2016-04-19 NOTE — Progress Notes (Signed)
5 Days Post-Op Procedure(s) (LRB): VIDEO ASSISTED THORACOSCOPY (VATS)/LEFT UPPER LOBECTOMY (Left) Subjective: Some pain from CT  Objective: Vital signs in last 24 hours: Temp:  [97.9 F (36.6 C)-98.8 F (37.1 C)] 98.7 F (37.1 C) (10/16 0256) Pulse Rate:  [74-83] 78 (10/16 0256) Cardiac Rhythm: Normal sinus rhythm (10/15 2000) Resp:  [15-24] 15 (10/16 0400) BP: (112-139)/(62-75) 122/73 (10/16 0256) SpO2:  [93 %-99 %] 93 % (10/16 0400)  Hemodynamic parameters for last 24 hours:    Intake/Output from previous day: 10/15 0701 - 10/16 0700 In: 1450 [P.O.:1200; I.V.:250] Out: 1900 [Urine:1750; Chest Tube:150] Intake/Output this shift: No intake/output data recorded.  General appearance: alert, cooperative and no distress Neurologic: remains hoarse Heart: regular rate and rhythm Lungs: diminished breath sounds left base + air leak (decreased from Friday)  Lab Results: No results for input(s): WBC, HGB, HCT, PLT in the last 72 hours. BMET: No results for input(s): NA, K, CL, CO2, GLUCOSE, BUN, CREATININE, CALCIUM in the last 72 hours.  PT/INR: No results for input(s): LABPROT, INR in the last 72 hours. ABG    Component Value Date/Time   PHART 7.367 04/15/2016 0500   HCO3 23.8 04/15/2016 0500   TCO2 16 11/23/2014 0314   ACIDBASEDEF 0.8 04/15/2016 0500   O2SAT 90.1 04/15/2016 0500   CBG (last 3)   Recent Labs  04/18/16 1223 04/18/16 1713 04/18/16 2124  GLUCAP 122* 179* 156*    Assessment/Plan: S/P Procedure(s) (LRB): VIDEO ASSISTED THORACOSCOPY (VATS)/LEFT UPPER LOBECTOMY (Left) -  POD # 5 left upper lobectomy Air leak still present but decreased- will try on water seal hoarseness persists- no signs of aspiration CBG control better Continue ambulation SCD + enoxaparin for DVT prophylaxis Path- T1bN0 stage IA- patient informed   LOS: 5 days    [name] [name] 04/19/2016

## 2016-04-19 NOTE — Care Management Important Message (Signed)
Important Message  Patient Details  Name: [name] [name] MRN: [id number] Date of Birth: December 30, 1950   Medicare Important Message Given:  Yes    [name] Abena 04/19/2016, 1:43 PM

## 2016-04-20 ENCOUNTER — Inpatient Hospital Stay (HOSPITAL_COMMUNITY): Payer: Medicare Other

## 2016-04-20 LAB — CBC
HCT: 36.1 % — ABNORMAL LOW (ref 39.0–52.0)
Hemoglobin: 12.2 g/dL — ABNORMAL LOW (ref 13.0–17.0)
MCH: 27.9 pg (ref 26.0–34.0)
MCHC: 33.8 g/dL (ref 30.0–36.0)
MCV: 82.6 fL (ref 78.0–100.0)
PLATELETS: 222 10*3/uL (ref 150–400)
RBC: 4.37 MIL/uL (ref 4.22–5.81)
RDW: 13.4 % (ref 11.5–15.5)
WBC: 14.5 10*3/uL — AB (ref 4.0–10.5)

## 2016-04-20 LAB — BASIC METABOLIC PANEL
ANION GAP: 8 (ref 5–15)
BUN: 51 mg/dL — ABNORMAL HIGH (ref 6–20)
CO2: 24 mmol/L (ref 22–32)
Calcium: 8.7 mg/dL — ABNORMAL LOW (ref 8.9–10.3)
Chloride: 108 mmol/L (ref 101–111)
Creatinine, Ser: 2.39 mg/dL — ABNORMAL HIGH (ref 0.61–1.24)
GFR, EST AFRICAN AMERICAN: 31 mL/min — AB (ref >60)
GFR, EST NON AFRICAN AMERICAN: 26 mL/min — AB (ref >60)
Glucose, Bld: 133 mg/dL — ABNORMAL HIGH (ref 65–99)
POTASSIUM: 3.7 mmol/L (ref 3.5–5.1)
SODIUM: 140 mmol/L (ref 135–145)

## 2016-04-20 LAB — GLUCOSE, CAPILLARY
GLUCOSE-CAPILLARY: 154 mg/dL — AB (ref 65–99)
GLUCOSE-CAPILLARY: 134 mg/dL — AB (ref 65–99)
Glucose-Capillary: 196 mg/dL — ABNORMAL HIGH (ref 65–99)
Glucose-Capillary: 71 mg/dL (ref 65–99)

## 2016-04-20 MED ORDER — HYDRALAZINE HCL 20 MG/ML IJ SOLN: 10.0000 mg | Freq: Four times a day (QID) | INTRAMUSCULAR | Status: DC | PRN

## 2016-04-20 NOTE — Progress Notes (Addendum)
      [address]       [place] [address]             [phone]      6 Days Post-Op Procedure(s) (LRB): VIDEO ASSISTED THORACOSCOPY (VATS)/LEFT UPPER LOBECTOMY (Left)   Subjective:  [name] [name] has no specific complaints.  States he is doing pretty well.  He has ambulated since surgery.  + BM  Objective: Vital signs in last 24 hours: Temp:  [97.4 F (36.3 C)-98.8 F (37.1 C)] 98.1 F (36.7 C) (10/17 0305) Pulse Rate:  [72-99] 99 (10/17 0305) Cardiac Rhythm: Normal sinus rhythm (10/17 0305) Resp:  [17-25] 18 (10/17 0336) BP: (112-149)/(67-105) 112/67 (10/17 0305) SpO2:  [93 %-100 %] 98 % (10/17 0336) FiO2 (%):  [0 %] 0 % (10/17 0000)  Intake/Output from previous day: 10/16 0701 - 10/17 0700 In: 814.8 [P.O.:720; I.V.:94.8] Out: 1600 [Urine:1600]  General appearance: alert, cooperative and no distress Heart: regular rate and rhythm Lungs: diminished breath sounds on left Abdomen: soft, non-tender; bowel sounds normal; no masses,  no organomegaly Extremities: extremities normal, atraumatic, no cyanosis or edema Wound: clean and dry  Lab Results:  Recent Labs  04/20/16 0350  WBC 14.5*  HGB 12.2*  HCT 36.1*  PLT 222   BMET:  Recent Labs  04/20/16 0350  NA 140  K 3.7  CL 108  CO2 24  GLUCOSE 133*  BUN 51*  CREATININE 2.39*  CALCIUM 8.7*    PT/INR: No results for input(s): LABPROT, INR in the last 72 hours. ABG    Component Value Date/Time   PHART 7.367 04/15/2016 0500   HCO3 23.8 04/15/2016 0500   TCO2 16 11/23/2014 0314   ACIDBASEDEF 0.8 04/15/2016 0500   O2SAT 90.1 04/15/2016 0500   CBG (last 3)   Recent Labs  04/19/16 1146 04/19/16 1810 04/19/16 2106  GLUCAP 174* 135* 146*    Assessment/Plan: S/P Procedure(s) (LRB): VIDEO ASSISTED THORACOSCOPY (VATS)/LEFT UPPER LOBECTOMY (Left)  1. Chest tube- minimal output, + air leak with cough, CXR shows increase in previous pneumothorax... Will place chest tube back on  suction 2. CV- NSR, BP labile 90-140s- will stop Cozaar/HCTZ for now with elevation in creatinine 3. Renal- creatinine has risen to 2.39, his negative on I/O for past 48 hours, will bump IV fluid to 50 cc/hr, stop HCTZ repeat BMET in AM 4. ENT- hoarseness persists, tolerating oral intake without difficulty 5. Dispo- patient with increase in pneumothorax, + air leak on water seal.. Will place back on suction, IV fluids for increase in creatinine, repeat BMET in AM   LOS: 6 days    [name] [name] 04/20/2016 Patient seen and examined, agree with above His pneumo was a little bigger on water seal so will place back to suction- air leak is small and should resolve without intervention Concerned about creatinine rising. Has baseline creatinine ~ 2 with stage III CKD. Will hydrate and recheck in AM Needs to ambulate more  [name] Lipps C. Roxan Hockey, MD [facility] [phone]  Probable acute kidney injury in setting of stage III CKD.  [name] Standard Roxan Hockey, MD [facility] [phone]

## 2016-04-21 ENCOUNTER — Inpatient Hospital Stay (HOSPITAL_COMMUNITY): Payer: Medicare Other

## 2016-04-21 LAB — URINALYSIS, ROUTINE W REFLEX MICROSCOPIC
BILIRUBIN URINE: NEGATIVE
GLUCOSE, UA: NEGATIVE mg/dL
HGB URINE DIPSTICK: NEGATIVE
Ketones, ur: NEGATIVE mg/dL
Leukocytes, UA: NEGATIVE
Nitrite: NEGATIVE
PROTEIN: NEGATIVE mg/dL
Specific Gravity, Urine: 1.012 (ref 1.005–1.030)
pH: 5.5 (ref 5.0–8.0)

## 2016-04-21 LAB — GLUCOSE, CAPILLARY
GLUCOSE-CAPILLARY: 183 mg/dL — AB (ref 65–99)
Glucose-Capillary: 107 mg/dL — ABNORMAL HIGH (ref 65–99)
Glucose-Capillary: 125 mg/dL — ABNORMAL HIGH (ref 65–99)
Glucose-Capillary: 132 mg/dL — ABNORMAL HIGH (ref 65–99)

## 2016-04-21 LAB — BASIC METABOLIC PANEL
ANION GAP: 6 (ref 5–15)
BUN: 50 mg/dL — AB (ref 6–20)
CHLORIDE: 110 mmol/L (ref 101–111)
CO2: 24 mmol/L (ref 22–32)
Calcium: 8.5 mg/dL — ABNORMAL LOW (ref 8.9–10.3)
Creatinine, Ser: 2.38 mg/dL — ABNORMAL HIGH (ref 0.61–1.24)
GFR calc Af Amer: 31 mL/min — ABNORMAL LOW (ref >60)
GFR, EST NON AFRICAN AMERICAN: 27 mL/min — AB (ref >60)
GLUCOSE: 94 mg/dL (ref 65–99)
POTASSIUM: 3.7 mmol/L (ref 3.5–5.1)
Sodium: 140 mmol/L (ref 135–145)

## 2016-04-21 MED ORDER — SITAGLIPTIN PHOSPHATE 25 MG PO TABS
50.0000 mg | ORAL_TABLET | Freq: Every day | ORAL | Status: DC
  Administered 2016-04-21 – 2016-04-26 (×6): 50 mg via ORAL
  Filled 2016-04-21: qty 2
  Filled 2016-04-21: qty 1
  Filled 2016-04-21: qty 2
  Filled 2016-04-21: qty 1
  Filled 2016-04-21 (×2): qty 2

## 2016-04-21 NOTE — Progress Notes (Signed)
Late Entry  Remaining amount of fentanyl PCA wasted in sink with Josie Dixon, RN this AM.   Aaron Edelman, Lonni Fix, RN

## 2016-04-21 NOTE — Progress Notes (Signed)
7 Days Post-Op Procedure(s) (LRB): VIDEO ASSISTED THORACOSCOPY (VATS)/LEFT UPPER LOBECTOMY (Left) Subjective: No complaints this AM Frustrated at not being able to go home  Objective: Vital signs in last 24 hours: Temp:  [97.8 F (36.6 C)-98.9 F (37.2 C)] 98.6 F (37 C) (10/18 0249) Pulse Rate:  [64-87] 66 (10/18 0708) Cardiac Rhythm: Normal sinus rhythm (10/18 0708) Resp:  [14-27] 21 (10/18 0708) BP: (109-128)/(66-74) 122/68 (10/18 0708) SpO2:  [95 %-99 %] 97 % (10/18 0708)  Hemodynamic parameters for last 24 hours:    Intake/Output from previous day: 10/17 0701 - 10/18 0700 In: 1452.7 [P.O.:240; I.V.:1212.7] Out: 1895 [AOZHY:8657; Chest Tube:120] Intake/Output this shift: Total I/O In: 91.7 [I.V.:91.7] Out: 335 [Urine:325; Chest Tube:10]  General appearance: alert, cooperative and no distress Neurologic: intact Heart: regular rate and rhythm Lungs: diminished breath sounds lef tbase Wound: clean and dry, small air leak persists  Lab Results:  Recent Labs  04/20/16 0350  WBC 14.5*  HGB 12.2*  HCT 36.1*  PLT 222   BMET:  Recent Labs  04/20/16 0350 04/21/16 0535  NA 140 140  K 3.7 3.7  CL 108 110  CO2 24 24  GLUCOSE 133* 94  BUN 51* 50*  CREATININE 2.39* 2.38*  CALCIUM 8.7* 8.5*    PT/INR: No results for input(s): LABPROT, INR in the last 72 hours. ABG    Component Value Date/Time   PHART 7.367 04/15/2016 0500   HCO3 23.8 04/15/2016 0500   TCO2 16 11/23/2014 0314   ACIDBASEDEF 0.8 04/15/2016 0500   O2SAT 90.1 04/15/2016 0500   CBG (last 3)   Recent Labs  04/20/16 1236 04/20/16 1800 04/20/16 2124  GLUCAP 134* 196* 71    Assessment/Plan: S/P Procedure(s) (LRB): VIDEO ASSISTED THORACOSCOPY (VATS)/LEFT UPPER LOBECTOMY (Left) -s/p left upper lobectomy. Has persistent air leak. It is small enough it should stop on its own. His CXR shows the lung better expanded this morning- will leave on 10 cm suction  Acute kidney injury in setting of  CKD- creatinine is unchanged this morning- hopefully plateauing and will return to baseline. Recheck in AM. Will check UA to rule out UTI  DM- CBG highly variable, will adjust januvia dose for renal function  Continue ambulation  Not using PCA- dc   LOS: 7 days    Melrose Nakayama 04/21/2016

## 2016-04-22 ENCOUNTER — Inpatient Hospital Stay (HOSPITAL_COMMUNITY): Payer: Medicare Other

## 2016-04-22 LAB — BASIC METABOLIC PANEL
Anion gap: 6 (ref 5–15)
BUN: 44 mg/dL — ABNORMAL HIGH (ref 6–20)
CALCIUM: 8.2 mg/dL — AB (ref 8.9–10.3)
CHLORIDE: 109 mmol/L (ref 101–111)
CO2: 25 mmol/L (ref 22–32)
CREATININE: 2.2 mg/dL — AB (ref 0.61–1.24)
GFR calc non Af Amer: 29 mL/min — ABNORMAL LOW (ref >60)
GFR, EST AFRICAN AMERICAN: 34 mL/min — AB (ref >60)
Glucose, Bld: 107 mg/dL — ABNORMAL HIGH (ref 65–99)
Potassium: 4 mmol/L (ref 3.5–5.1)
SODIUM: 140 mmol/L (ref 135–145)

## 2016-04-22 LAB — CBC
HEMATOCRIT: 33.7 % — AB (ref 39.0–52.0)
HEMOGLOBIN: 11.3 g/dL — AB (ref 13.0–17.0)
MCH: 27.6 pg (ref 26.0–34.0)
MCHC: 33.5 g/dL (ref 30.0–36.0)
MCV: 82.2 fL (ref 78.0–100.0)
Platelets: 251 10*3/uL (ref 150–400)
RBC: 4.1 MIL/uL — ABNORMAL LOW (ref 4.22–5.81)
RDW: 13.3 % (ref 11.5–15.5)
WBC: 13 10*3/uL — AB (ref 4.0–10.5)

## 2016-04-22 LAB — GLUCOSE, CAPILLARY
GLUCOSE-CAPILLARY: 115 mg/dL — AB (ref 65–99)
Glucose-Capillary: 160 mg/dL — ABNORMAL HIGH (ref 65–99)
Glucose-Capillary: 138 mg/dL — ABNORMAL HIGH (ref 65–99)
Glucose-Capillary: 146 mg/dL — ABNORMAL HIGH (ref 65–99)

## 2016-04-22 LAB — FUNGUS CULTURE RESULT

## 2016-04-22 LAB — FUNGUS CULTURE WITH STAIN

## 2016-04-22 LAB — FUNGAL ORGANISM REFLEX

## 2016-04-22 NOTE — Progress Notes (Addendum)
      [address]       [place] [address]             [phone]      8 Days Post-Op Procedure(s) (LRB): VIDEO ASSISTED THORACOSCOPY (VATS)/LEFT UPPER LOBECTOMY (Left)   Subjective:  Patient complaining staff is being very loud at desk.  He has been unable to rest this morning due to the noise.  He otherwise has no complaints.  Objective: Vital signs in last 24 hours: Temp:  [97.8 F (36.6 C)-98.9 F (37.2 C)] 98.5 F (36.9 C) (10/19 0346) Pulse Rate:  [69-81] 69 (10/19 0340) Cardiac Rhythm: Normal sinus rhythm (10/18 2000) Resp:  [19-28] 19 (10/19 0340) BP: (118-131)/(56-72) 126/65 (10/19 0340) SpO2:  [96 %-100 %] 99 % (10/19 0340)  Intake/Output from previous day: 10/18 0701 - 10/19 0700 In: 1316.7 [P.O.:1080; I.V.:236.7] Out: 2580 [Urine:2550; Chest Tube:30]  General appearance: alert, cooperative and no distress Heart: regular rate and rhythm Lungs: clear to auscultation bilaterally Abdomen: soft, non-tender; bowel sounds normal; no masses,  no organomegaly Extremities: extremities normal, atraumatic, no cyanosis or edema Wound: clean and dry  Lab Results:  Recent Labs  04/20/16 0350 04/22/16 0448  WBC 14.5* 13.0*  HGB 12.2* 11.3*  HCT 36.1* 33.7*  PLT 222 251   BMET:  Recent Labs  04/21/16 0535 04/22/16 0448  NA 140 140  K 3.7 4.0  CL 110 109  CO2 24 25  GLUCOSE 94 107*  BUN 50* 44*  CREATININE 2.38* 2.20*  CALCIUM 8.5* 8.2*    PT/INR: No results for input(s): LABPROT, INR in the last 72 hours. ABG    Component Value Date/Time   PHART 7.367 04/15/2016 0500   HCO3 23.8 04/15/2016 0500   TCO2 16 11/23/2014 0314   ACIDBASEDEF 0.8 04/15/2016 0500   O2SAT 90.1 04/15/2016 0500   CBG (last 3)   Recent Labs  04/21/16 1143 04/21/16 1710 04/21/16 2153  GLUCAP 183* 125* 132*    Assessment/Plan: S/P Procedure(s) (LRB): VIDEO ASSISTED THORACOSCOPY (VATS)/LEFT UPPER LOBECTOMY (Left)  1. Chest tube- 1+ air leak with  cough on 10 cm of suction... CXR shows improvement of previous small pneumothorax- will continue chest tube on suction today 2. Renal- AKI, creatinine improved some today, down to 2.20, UA yesterday was negative for UTI... Continue to monitor baseline is about 1.9 3. DM- sugars ranging from 70s-180s- januvia adjusted for renal function, continue current regimen 4. Dispo- patient stable, leave chest tube on suction today, repeat CXR in AM, BMET in AM   LOS: 8 days    BARRETT, ERIN 04/22/2016 Patient seen and examined, agree with above Air leak is more intermittent today. It should resolve on its own  Revonda Standard. Roxan Hockey, MD [facility] [phone]

## 2016-04-22 NOTE — Care Management Important Message (Signed)
Important Message  Patient Details  Name: [name] [name] MRN: [id number] Date of Birth: September 06, 1950   Medicare Important Message Given:  Yes    Nathen May 04/22/2016, 10:43 AM

## 2016-04-23 ENCOUNTER — Inpatient Hospital Stay (HOSPITAL_COMMUNITY): Payer: Medicare Other

## 2016-04-23 LAB — BASIC METABOLIC PANEL
Anion gap: 4 — ABNORMAL LOW (ref 5–15)
BUN: 36 mg/dL — AB (ref 6–20)
CHLORIDE: 109 mmol/L (ref 101–111)
CO2: 26 mmol/L (ref 22–32)
CREATININE: 1.93 mg/dL — AB (ref 0.61–1.24)
Calcium: 8.4 mg/dL — ABNORMAL LOW (ref 8.9–10.3)
GFR calc Af Amer: 40 mL/min — ABNORMAL LOW (ref >60)
GFR calc non Af Amer: 34 mL/min — ABNORMAL LOW (ref >60)
GLUCOSE: 83 mg/dL (ref 65–99)
POTASSIUM: 3.7 mmol/L (ref 3.5–5.1)
SODIUM: 139 mmol/L (ref 135–145)

## 2016-04-23 LAB — GLUCOSE, CAPILLARY
GLUCOSE-CAPILLARY: 96 mg/dL (ref 65–99)
GLUCOSE-CAPILLARY: 126 mg/dL — AB (ref 65–99)
GLUCOSE-CAPILLARY: 271 mg/dL — AB (ref 65–99)
Glucose-Capillary: 101 mg/dL — ABNORMAL HIGH (ref 65–99)

## 2016-04-23 LAB — FUNGUS CULTURE WITH STAIN

## 2016-04-23 LAB — FUNGUS CULTURE RESULT

## 2016-04-23 LAB — FUNGAL ORGANISM REFLEX

## 2016-04-23 NOTE — Progress Notes (Addendum)
9 Days Post-Op Procedure(s) (LRB): VIDEO ASSISTED THORACOSCOPY (VATS)/LEFT UPPER LOBECTOMY (Left) Subjective: No complaints today, anxious to go home  Objective: Vital signs in last 24 hours: Temp:  [97.7 F (36.5 C)-98.8 F (37.1 C)] 97.8 F (36.6 C) (10/20 0621) Pulse Rate:  [66-85] 66 (10/20 0621) Cardiac Rhythm: Sinus bradycardia (10/20 0700) Resp:  [18-30] 20 (10/20 0621) BP: (119-138)/(63-66) 119/63 (10/20 0621) SpO2:  [98 %] 98 % (10/20 0016)  Hemodynamic parameters for last 24 hours:    Intake/Output from previous day: 10/19 0701 - 10/20 0700 In: 1080 [P.O.:1080] Out: 2210 [Urine:2050; Chest Tube:160] Intake/Output this shift: No intake/output data recorded.  General appearance: alert, cooperative and no distress Neurologic: intact Heart: regular rate and rhythm Lungs: diminished breath sounds left base Abdomen: normal findings: soft, non-tender no air leak  Lab Results:  Recent Labs  04/22/16 0448  WBC 13.0*  HGB 11.3*  HCT 33.7*  PLT 251   BMET:  Recent Labs  04/22/16 0448 04/23/16 0345  NA 140 139  K 4.0 3.7  CL 109 109  CO2 25 26  GLUCOSE 107* 83  BUN 44* 36*  CREATININE 2.20* 1.93*  CALCIUM 8.2* 8.4*    PT/INR: No results for input(s): LABPROT, INR in the last 72 hours. ABG    Component Value Date/Time   PHART 7.367 04/15/2016 0500   HCO3 23.8 04/15/2016 0500   TCO2 16 11/23/2014 0314   ACIDBASEDEF 0.8 04/15/2016 0500   O2SAT 90.1 04/15/2016 0500   CBG (last 3)   Recent Labs  04/22/16 1613 04/22/16 2128 04/23/16 0718  GLUCAP 138* 146* 96    Assessment/Plan: S/P Procedure(s) (LRB): VIDEO ASSISTED THORACOSCOPY (VATS)/LEFT UPPER LOBECTOMY (Left) -  No air leak with cough this AM Will place tube to water seal, repeat CXR at noon and again in AM Continue ambulation SCD + enoxaparin for DVT prophylaxis Pain well controlled Hoarseness persists Creatinine back to baseline   LOS: 9 days    Melrose Nakayama 04/23/2016

## 2016-04-23 NOTE — Progress Notes (Signed)
      [address]       [place], [address]             [phone]      No complaints  He is tolerating water seal well. No leak on exam.  Repeat CXR unchanged tiny apical space v pneumo  Dc CT in AM if no leak  Remo Lipps C. Roxan Hockey, MD [facility] [phone]

## 2016-04-24 ENCOUNTER — Inpatient Hospital Stay (HOSPITAL_COMMUNITY): Payer: Medicare Other

## 2016-04-24 LAB — GLUCOSE, CAPILLARY
GLUCOSE-CAPILLARY: 117 mg/dL — AB (ref 65–99)
GLUCOSE-CAPILLARY: 73 mg/dL (ref 65–99)
Glucose-Capillary: 118 mg/dL — ABNORMAL HIGH (ref 65–99)
Glucose-Capillary: 214 mg/dL — ABNORMAL HIGH (ref 65–99)

## 2016-04-24 NOTE — Progress Notes (Signed)
Last remaining chest tube removed per MD order. Pt tolerated procedure well. Will continue to monitor.  

## 2016-04-24 NOTE — Progress Notes (Addendum)
      [address]       [place] [address]             [phone]      10 Days Post-Op Procedure(s) (LRB): VIDEO ASSISTED THORACOSCOPY (VATS)/LEFT UPPER LOBECTOMY (Left)   Subjective:  [name] [name] has no complaints.  Continues to express wanting to go home.  Objective: Vital signs in last 24 hours: Temp:  [97.7 F (36.5 C)-98.5 F (36.9 C)] 97.8 F (36.6 C) (10/21 0833) Pulse Rate:  [63-79] 66 (10/21 0833) Cardiac Rhythm: Normal sinus rhythm (10/21 0700) Resp:  [17-23] 23 (10/21 0833) BP: (114-128)/(60-67) 114/63 (10/21 0833) SpO2:  [97 %-100 %] 100 % (10/21 0833)  Intake/Output from previous day: 10/20 0701 - 10/21 0700 In: 1320 [P.O.:1320] Out: 1940 [Urine:1850; Chest Tube:90] Intake/Output this shift: Total I/O In: -  Out: 300 [Urine:300]  General appearance: alert, cooperative and no distress Heart: regular rate and rhythm Lungs: clear to auscultation bilaterally Abdomen: soft, non-tender; bowel sounds normal; no masses,  no organomegaly Wound: clean and dry  Lab Results:  Recent Labs  04/22/16 0448  WBC 13.0*  HGB 11.3*  HCT 33.7*  PLT 251   BMET:  Recent Labs  04/22/16 0448 04/23/16 0345  NA 140 139  K 4.0 3.7  CL 109 109  CO2 25 26  GLUCOSE 107* 83  BUN 44* 36*  CREATININE 2.20* 1.93*  CALCIUM 8.2* 8.4*    PT/INR: No results for input(s): LABPROT, INR in the last 72 hours. ABG    Component Value Date/Time   PHART 7.367 04/15/2016 0500   HCO3 23.8 04/15/2016 0500   TCO2 16 11/23/2014 0314   ACIDBASEDEF 0.8 04/15/2016 0500   O2SAT 90.1 04/15/2016 0500   CBG (last 3)   Recent Labs  04/23/16 1619 04/23/16 2110 04/24/16 0828  GLUCAP 271* 101* 117*    Assessment/Plan: S/P Procedure(s) (LRB): VIDEO ASSISTED THORACOSCOPY (VATS)/LEFT UPPER LOBECTOMY (Left)  1. Chest tube- no air leak present, CXR with resolution of pneumothorax- will d/c chest tube today 2. ENT- hoarseness persists 3. Dispo- patient stable,  d/c chest tube, repeat CXR in AM if remains stable will d/c tomorrow   LOS: 10 days    BARRETT, ERIN 04/24/2016  I have seen and examined the patient and agree with the assessment and plan as outlined.  Rexene Alberts, MD 04/24/2016 1:49 PM

## 2016-04-25 ENCOUNTER — Inpatient Hospital Stay (HOSPITAL_COMMUNITY): Payer: Medicare Other

## 2016-04-25 LAB — GLUCOSE, CAPILLARY
GLUCOSE-CAPILLARY: 157 mg/dL — AB (ref 65–99)
GLUCOSE-CAPILLARY: 173 mg/dL — AB (ref 65–99)
Glucose-Capillary: 102 mg/dL — ABNORMAL HIGH (ref 65–99)
Glucose-Capillary: 102 mg/dL — ABNORMAL HIGH (ref 65–99)

## 2016-04-25 NOTE — Progress Notes (Addendum)
      [name] [name] [address]       [place] [address]             [phone]      11 Days Post-Op Procedure(s) (LRB): VIDEO ASSISTED THORACOSCOPY (VATS)/LEFT UPPER LOBECTOMY (Left)   Subjective:  [name] [name] has no complaints.  Denies chest pain and shortness of breath.  Remains adamant he wants to go home today.     Objective: Vital signs in last 24 hours: Temp:  [97.5 F (36.4 C)-98.8 F (37.1 C)] 98.1 F (36.7 C) (10/22 0806) Pulse Rate:  [69-80] 69 (10/22 0806) Cardiac Rhythm: Normal sinus rhythm (10/22 0806) Resp:  [17-24] 20 (10/22 0806) BP: (115-131)/(62-72) 130/67 (10/22 0806) SpO2:  [96 %-99 %] 96 % (10/22 0806)  Intake/Output from previous day: 10/21 0701 - 10/22 0700 In: -  Out: 300 [Urine:300] Intake/Output this shift: Total I/O In: 120 [P.O.:120] Out: -   General appearance: alert, cooperative and no distress Heart: regular rate and rhythm Lungs: diminished breath sounds LUL Abdomen: soft, non-tender; bowel sounds normal; no masses,  no organomegaly Wound: clean and dry  Lab Results: No results for input(s): WBC, HGB, HCT, PLT in the last 72 hours. BMET:  Recent Labs  04/23/16 0345  NA 139  K 3.7  CL 109  CO2 26  GLUCOSE 83  BUN 36*  CREATININE 1.93*  CALCIUM 8.4*    PT/INR: No results for input(s): LABPROT, INR in the last 72 hours. ABG    Component Value Date/Time   PHART 7.367 04/15/2016 0500   HCO3 23.8 04/15/2016 0500   TCO2 16 11/23/2014 0314   ACIDBASEDEF 0.8 04/15/2016 0500   O2SAT 90.1 04/15/2016 0500   CBG (last 3)   Recent Labs  04/24/16 1615 04/24/16 2145 04/25/16 0808  GLUCAP 214* 73 102*    Assessment/Plan: S/P Procedure(s) (LRB): VIDEO ASSISTED THORACOSCOPY (VATS)/LEFT UPPER LOBECTOMY (Left)  1. Pulm- CXR shows re-development of pneumothorax post chest tube removal--- patient is adamant he wants to go home and does not want another chest tube.  I explained to the patient that it is not recommended he  leave with a development of pneumothorax as this could enlarge and cause him respiratory distress 2. ENT- hoarseness persist 3. Dispo- redevelopment of pneumothorax... Can hold off on chest tube for now.... Will get repeat CXR at 1500, if pneumothorax worsens or patient develops distress may need chest tube placed.   LOS: 11 days    [name] [name], [name] [name] 04/25/2016   I have seen and examined the patient and agree with the assessment and plan as outlined.  Patient looks quite stable and remains asymptomatic.  No subQ emphysema in chest wall.  PTX likely represents a residual space.  Under the circumstances w/ history of prolonged air leak will keep him overnight and repeat CXR in am tomorrow.  Repeat CXR today if patient develops symptoms.  Rexene Alberts, MD 04/25/2016 12:48 PM

## 2016-04-26 ENCOUNTER — Inpatient Hospital Stay (HOSPITAL_COMMUNITY): Payer: Medicare Other

## 2016-04-26 LAB — GLUCOSE, CAPILLARY: Glucose-Capillary: 124 mg/dL — ABNORMAL HIGH (ref 65–99)

## 2016-04-26 MED ORDER — METOPROLOL TARTRATE 25 MG PO TABS: 25.0000 mg | ORAL_TABLET | Freq: Two times a day (BID) | ORAL | 1 refills | Status: DC

## 2016-04-26 MED ORDER — OXYCODONE HCL 5 MG PO TABS: 5.0000 mg | ORAL_TABLET | Freq: Four times a day (QID) | ORAL | 0 refills | Status: DC | PRN

## 2016-04-26 NOTE — Care Management Important Message (Signed)
Important Message  Patient Details  Name: [name] [name] MRN: [id number] Date of Birth: 1951-03-13   Medicare Important Message Given:  Yes    Orbie Pyo 04/26/2016, 4:02 PM

## 2016-04-26 NOTE — Progress Notes (Signed)
dc'ed central line per policy and procedures. Patient tolerated well. Sterile dressing applied. Site is clean dry and intact. Patient is aware that he needs to stay in the bed for 30 minutes after removal of the line. Patient is comfortable with vss and call bell within reach. Will continue to monitor.

## 2016-04-26 NOTE — Care Management Note (Signed)
Case Management Note  Patient Details  Name: LOVEL SUAZO MRN: [id number] Date of Birth: 1951-06-18  Subjective/Objective:  S/p VATS , lobectomy, pta indep, lives with sister, he has a pcp, he has medication coverage and he has transportation at discharge, he has no needs.                   Action/Plan:   Expected Discharge Date:  04/19/16               Expected Discharge Plan:  Home/Self Care  In-House Referral:     Discharge planning Services  CM Consult  Post Acute Care Choice:    Choice offered to:     DME Arranged:    DME Agency:     HH Arranged:    HH Agency:     Status of Service:  Completed, signed off  If discussed at [employer] of Stay Meetings, dates discussed:    Additional Comments:  Zenon Mayo, RN 04/26/2016, 11:20 AM

## 2016-04-26 NOTE — Progress Notes (Signed)
12 Days Post-Op Procedure(s) (LRB): VIDEO ASSISTED THORACOSCOPY (VATS)/LEFT UPPER LOBECTOMY (Left) Subjective: No complaints Voice still hoarse  Objective: Vital signs in last 24 hours: Temp:  [97.5 F (36.4 C)-98.5 F (36.9 C)] 98.5 F (36.9 C) (10/23 0328) Pulse Rate:  [65-82] 69 (10/23 0328) Cardiac Rhythm: Normal sinus rhythm (10/22 2203) Resp:  [17-20] 19 (10/23 0328) BP: (111-147)/(62-70) 111/62 (10/23 0328) SpO2:  [95 %-99 %] 95 % (10/23 0328)  Hemodynamic parameters for last 24 hours:    Intake/Output from previous day: 10/22 0701 - 10/23 0700 In: 120 [P.O.:120] Out: -  Intake/Output this shift: No intake/output data recorded.  General appearance: alert, cooperative and no distress Neurologic: intact Heart: regular rate and rhythm Lungs: diminished breath sounds left base Wound: clean and dry  Lab Results: No results for input(s): WBC, HGB, HCT, PLT in the last 72 hours. BMET: No results for input(s): NA, K, CL, CO2, GLUCOSE, BUN, CREATININE, CALCIUM in the last 72 hours.  PT/INR: No results for input(s): LABPROT, INR in the last 72 hours. 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

## 2016-04-26 NOTE — Progress Notes (Signed)
Discussed discharge instructions and medications with patient and family. Both verbalized understanding with all questions answered. VSS. Pt discharged home with family.  Yuliana Vandrunen,RN  

## 2016-04-27 ENCOUNTER — Encounter (HOSPITAL_COMMUNITY): Payer: Self-pay

## 2016-04-30 ENCOUNTER — Other Ambulatory Visit: Payer: Self-pay | Admitting: Hematology and Oncology

## 2016-04-30 ENCOUNTER — Ambulatory Visit (INDEPENDENT_AMBULATORY_CARE_PROVIDER_SITE_OTHER): Payer: Self-pay

## 2016-04-30 DIAGNOSIS — Z4802 Encounter for removal of sutures: Secondary | ICD-10-CM

## 2016-04-30 DIAGNOSIS — C8338 Diffuse large B-cell lymphoma, lymph nodes of multiple sites: Secondary | ICD-10-CM

## 2016-04-30 DIAGNOSIS — C3492 Malignant neoplasm of unspecified part of left bronchus or lung: Secondary | ICD-10-CM

## 2016-04-30 NOTE — Progress Notes (Signed)
Removed 3 sutures form chest tube sites with no signs of infection and patient tolerated well.

## 2016-05-03 ENCOUNTER — Telehealth: Payer: Self-pay | Admitting: Hematology and Oncology

## 2016-05-03 ENCOUNTER — Other Ambulatory Visit: Payer: Self-pay | Admitting: Thoracic Surgery (Cardiothoracic Vascular Surgery)

## 2016-05-03 ENCOUNTER — Ambulatory Visit: Payer: Medicare Other

## 2016-05-03 ENCOUNTER — Encounter: Payer: Self-pay | Admitting: Hematology and Oncology

## 2016-05-03 ENCOUNTER — Ambulatory Visit (HOSPITAL_BASED_OUTPATIENT_CLINIC_OR_DEPARTMENT_OTHER): Payer: Medicare Other

## 2016-05-03 ENCOUNTER — Other Ambulatory Visit (HOSPITAL_BASED_OUTPATIENT_CLINIC_OR_DEPARTMENT_OTHER): Payer: Medicare Other

## 2016-05-03 ENCOUNTER — Telehealth: Payer: Self-pay | Admitting: *Deleted

## 2016-05-03 ENCOUNTER — Ambulatory Visit (HOSPITAL_BASED_OUTPATIENT_CLINIC_OR_DEPARTMENT_OTHER): Payer: Medicare Other | Admitting: Hematology and Oncology

## 2016-05-03 VITALS — BP 125/61 | HR 67 | Temp 97.9°F | Resp 18

## 2016-05-03 DIAGNOSIS — N183 Chronic kidney disease, stage 3 unspecified: Secondary | ICD-10-CM

## 2016-05-03 DIAGNOSIS — C3412 Malignant neoplasm of upper lobe, left bronchus or lung: Secondary | ICD-10-CM | POA: Diagnosis not present

## 2016-05-03 DIAGNOSIS — Z95828 Presence of other vascular implants and grafts: Secondary | ICD-10-CM

## 2016-05-03 DIAGNOSIS — C8338 Diffuse large B-cell lymphoma, lymph nodes of multiple sites: Secondary | ICD-10-CM

## 2016-05-03 DIAGNOSIS — D630 Anemia in neoplastic disease: Secondary | ICD-10-CM | POA: Diagnosis not present

## 2016-05-03 DIAGNOSIS — Z5112 Encounter for antineoplastic immunotherapy: Secondary | ICD-10-CM | POA: Diagnosis not present

## 2016-05-03 DIAGNOSIS — C3492 Malignant neoplasm of unspecified part of left bronchus or lung: Secondary | ICD-10-CM

## 2016-05-03 DIAGNOSIS — C3490 Malignant neoplasm of unspecified part of unspecified bronchus or lung: Secondary | ICD-10-CM

## 2016-05-03 LAB — COMPREHENSIVE METABOLIC PANEL
ALT: 31 U/L (ref 0–55)
AST: 19 U/L (ref 5–34)
Albumin: 3.3 g/dL — ABNORMAL LOW (ref 3.5–5.0)
Alkaline Phosphatase: 79 U/L (ref 40–150)
Anion Gap: 8 mEq/L (ref 3–11)
BUN: 21.2 mg/dL (ref 7.0–26.0)
CALCIUM: 8.9 mg/dL (ref 8.4–10.4)
CHLORIDE: 107 meq/L (ref 98–109)
CO2: 23 meq/L (ref 22–29)
Creatinine: 1.8 mg/dL — ABNORMAL HIGH (ref 0.7–1.3)
EGFR: 45 mL/min/{1.73_m2} — ABNORMAL LOW (ref >90)
Glucose: 146 mg/dl — ABNORMAL HIGH (ref 70–140)
POTASSIUM: 4.2 meq/L (ref 3.5–5.1)
SODIUM: 139 meq/L (ref 136–145)
Total Bilirubin: 0.42 mg/dL (ref 0.20–1.20)
Total Protein: 6.4 g/dL (ref 6.4–8.3)

## 2016-05-03 LAB — LACTATE DEHYDROGENASE: LDH: 253 U/L — AB (ref 125–245)

## 2016-05-03 LAB — CBC WITH DIFFERENTIAL/PLATELET
BASO%: 0.6 % (ref 0.0–2.0)
BASOS ABS: 0.1 10*3/uL (ref 0.0–0.1)
EOS%: 4 % (ref 0.0–7.0)
Eosinophils Absolute: 0.5 10*3/uL (ref 0.0–0.5)
HEMATOCRIT: 37.6 % — AB (ref 38.4–49.9)
HGB: 12.3 g/dL — ABNORMAL LOW (ref 13.0–17.1)
LYMPH%: 9.4 % — AB (ref 14.0–49.0)
MCH: 27.9 pg (ref 27.2–33.4)
MCHC: 32.8 g/dL (ref 32.0–36.0)
MCV: 84.9 fL (ref 79.3–98.0)
MONO#: 0.8 10*3/uL (ref 0.1–0.9)
MONO%: 6.8 % (ref 0.0–14.0)
NEUT#: 9.3 10*3/uL — ABNORMAL HIGH (ref 1.5–6.5)
NEUT%: 79.2 % — AB (ref 39.0–75.0)
Platelets: 315 10*3/uL (ref 140–400)
RBC: 4.43 10*6/uL (ref 4.20–5.82)
RDW: 14 % (ref 11.0–14.6)
WBC: 11.8 10*3/uL — ABNORMAL HIGH (ref 4.0–10.3)
lymph#: 1.1 10*3/uL (ref 0.9–3.3)

## 2016-05-03 MED ORDER — ACETAMINOPHEN 325 MG PO TABS
650.0000 mg | ORAL_TABLET | Freq: Once | ORAL | Status: AC
  Administered 2016-05-03: 650 mg via ORAL

## 2016-05-03 MED ORDER — DIPHENHYDRAMINE HCL 25 MG PO CAPS
50.0000 mg | ORAL_CAPSULE | Freq: Once | ORAL | Status: AC
  Administered 2016-05-03: 50 mg via ORAL

## 2016-05-03 MED ORDER — SODIUM CHLORIDE 0.9 % IV SOLN
375.0000 mg/m2 | Freq: Once | INTRAVENOUS | Status: AC
  Administered 2016-05-03: 700 mg via INTRAVENOUS
  Filled 2016-05-03: qty 50

## 2016-05-03 MED ORDER — HEPARIN SOD (PORK) LOCK FLUSH 100 UNIT/ML IV SOLN
500.0000 [IU] | Freq: Once | INTRAVENOUS | Status: AC | PRN
  Administered 2016-05-03: 500 [IU]
  Filled 2016-05-03: qty 5

## 2016-05-03 MED ORDER — ACETAMINOPHEN 325 MG PO TABS
ORAL_TABLET | ORAL | Status: AC
  Filled 2016-05-03: qty 2

## 2016-05-03 MED ORDER — SODIUM CHLORIDE 0.9 % IJ SOLN
10.0000 mL | INTRAMUSCULAR | Status: DC | PRN
  Administered 2016-05-03: 10 mL via INTRAVENOUS
  Filled 2016-05-03: qty 10

## 2016-05-03 MED ORDER — SODIUM CHLORIDE 0.9 % IJ SOLN
10.0000 mL | INTRAMUSCULAR | Status: DC | PRN
  Administered 2016-05-03: 10 mL
  Filled 2016-05-03: qty 10

## 2016-05-03 MED ORDER — SODIUM CHLORIDE 0.9 % IV SOLN
Freq: Once | INTRAVENOUS | Status: AC
Start: 2016-05-03 — End: 2016-05-03
  Administered 2016-05-03: 09:00:00 via INTRAVENOUS

## 2016-05-03 MED ORDER — DIPHENHYDRAMINE HCL 25 MG PO CAPS
ORAL_CAPSULE | ORAL | Status: AC
  Filled 2016-05-03: qty 2

## 2016-05-03 NOTE — Patient Instructions (Signed)
[facility] Discharge Instructions for Patients Receiving Chemotherapy  Today you received the following chemotherapy agents: Rituxan   To help prevent nausea and vomiting after your treatment, we encourage you to take your nausea medication as directed.    If you develop nausea and vomiting that is not controlled by your nausea medication, call the clinic.   BELOW ARE SYMPTOMS THAT SHOULD BE REPORTED IMMEDIATELY:  *FEVER GREATER THAN 100.5 F  *CHILLS WITH OR WITHOUT FEVER  NAUSEA AND VOMITING THAT IS NOT CONTROLLED WITH YOUR NAUSEA MEDICATION  *UNUSUAL SHORTNESS OF BREATH  *UNUSUAL BRUISING OR BLEEDING  TENDERNESS IN MOUTH AND THROAT WITH OR WITHOUT PRESENCE OF ULCERS  *URINARY PROBLEMS  *BOWEL PROBLEMS  UNUSUAL RASH Items with * indicate a potential emergency and should be followed up as soon as possible.  Feel free to call the clinic you have any questions or concerns. The clinic phone number is [phone].  Please show the CHEMO ALERT CARD at check-in to the Emergency Department and triage nurse.   

## 2016-05-03 NOTE — Assessment & Plan Note (Signed)
He has chronic kidney disease and this is unrelated to the lymphoma.  Continue close monitoring. he will continue current medical management. I recommend close follow-up with primary care doctor for medication adjustment. 

## 2016-05-03 NOTE — Assessment & Plan Note (Signed)
Clinically, he has no evidence of disease recurrence. I will continue rituximab. I will see him back in 2 months Next PET scan due in February 2018

## 2016-05-03 NOTE — Assessment & Plan Note (Signed)
This is likely due to recent treatment. The patient denies recent history of bleeding such as epistaxis, hematuria or hematochezia. He is asymptomatic from the anemia. I will observe for now.  He does not require transfusion now. 

## 2016-05-03 NOTE — Assessment & Plan Note (Signed)
I review recent guidelines. He does not need adjuvant treatment. We'll monitor with periodic imaging study

## 2016-05-03 NOTE — Progress Notes (Addendum)
[facility] OFFICE PROGRESS NOTE  Patient Care Team: Harrison Mons, PA-C as PCP - General (Physician Assistant) Corliss Parish, MD as Consulting Physician (Nephrology) Fanny Skates, MD as Consulting Physician (General Surgery) Heath Lark, MD as Consulting Physician (Hematology and Oncology) Javier Glazier, MD as Consulting Physician (Pulmonary Disease)  SUMMARY OF ONCOLOGIC HISTORY: Oncology History   Lymphoma   Staging form: Lymphoid Neoplasms, AJCC 6th Edition     Clinical stage from 11/21/2014: Stage IV - Signed by Heath Lark, MD on 11/21/2014       Diffuse large B-cell lymphoma of lymph nodes of multiple regions ([place])   11/13/2014 Imaging    PEt scan showed widespread disease including possible splenic involvement      11/19/2014 Surgery    He has excisional LN biopsy and port placement      11/19/2014 Pathology Results    Biopsy of lymph nodes showed DLBCL      11/29/2014 - 01/31/2015 Chemotherapy    He received 4 cycles of RCHOP chemotherapy complicated by severe infusion reaction.      12/19/2014 - 01/09/2015 Chemotherapy    He received 2 cycles of prophylactic IT chemotherapy.      01/31/2015 Adverse Reaction    I reduce the dose of his chemotherapy, prednisone and dexamethasone due to poorly controlled diabetes and hyperglycemia      02/12/2015 Imaging     repeat PET CT scan show complete response to chemotherapy but new lung infiltrates      09/09/2015 Imaging    PET CT showed complete response except for new inflammatory change in the lung      03/01/2016 Imaging    CT chest showed mild interval growth of subsolid left upper lobe pulmonary nodule, which was hypermetabolic on 76/28/3151 PET-CT. A primary bronchogenic adenocarcinoma cannot be excluded. Thoracic surgical consultation is advised. No lymphadenopathy or other findings of metastatic disease in the chest. Moderate emphysema with mild diffuse bronchial wall thickening, suggesting COPD.  Aortic atherosclerosis. Stable small pericardial effusion/thickening.       Adenocarcinoma of lung, stage 1, left ([place])   03/24/2016 Procedure    He underwent electromagnetic navigational bronchoscopy with needle aspirations, brushings, and biopsies.      03/24/2016 Pathology Results    Accession: [id number] biopsy was positive for adenocarcinoma      04/15/2016 Initial Diagnosis    Adenocarcinoma of lung, stage 1, left ([place])     04/15/2016 Surgery    He underwent left video-assisted thoracoscopy, thoracoscopic left upper lobectomy, mediastinal lymph node dissection, on-Q local anesthetic catheter placement      04/15/2016 Pathology Results    Accession: [id number] surgical specimen revealed adenocarcinoma, 2.2 cm with negative margin and negative LN involvement       INTERVAL HISTORY: Please see below for problem oriented charting. He feels well. He is recovering from surgery. He denies pain. No recent infection. No new lymphadenopathy   REVIEW OF SYSTEMS:   Constitutional: Denies fevers, chills or abnormal weight loss Eyes: Denies blurriness of vision Ears, nose, mouth, throat, and face: Denies mucositis or sore throat Respiratory: Denies cough, dyspnea or wheezes Cardiovascular: Denies palpitation, chest discomfort or lower extremity swelling Gastrointestinal:  Denies nausea, heartburn or change in bowel habits Skin: Denies abnormal skin rashes Lymphatics: Denies new lymphadenopathy or easy bruising Neurological:Denies numbness, tingling or new weaknesses Behavioral/Psych: Mood is stable, no new changes  All other systems were reviewed with the patient and are negative.  I have reviewed the past medical history, past  surgical history, social history and family history with the patient and they are unchanged from previous note.  ALLERGIES:  is allergic to ace inhibitors and rituximab.  MEDICATIONS:  Current Outpatient Prescriptions  Medication Sig Dispense  Refill  . amLODipine (NORVASC) 10 MG tablet TAKE ONE TABLET BY MOUTH ONCE DAILY (Patient taking differently: Take 10 mg by mouth daily. ) 90 tablet 3  . aspirin 81 MG tablet Take 81 mg by mouth daily.    . fish oil-omega-3 fatty acids 1000 MG capsule Take 1 g by mouth daily.    Marland Kitchen glipiZIDE (GLUCOTROL XL) 10 MG 24 hr tablet TAKE ONE TABLET BY MOUTH ONCE DAILY (Patient taking differently: Take 10 mg by mouth daily. ) 90 tablet 3  . glucose blood (ACCU-CHEK AVIVA PLUS) test strip Use as instructed 100 each 4  . metoprolol tartrate (LOPRESSOR) 25 MG tablet Take 1 tablet (25 mg total) by mouth 2 (two) times daily. 60 tablet 1  . oxyCODONE (OXY IR/ROXICODONE) 5 MG immediate release tablet Take 1-2 tablets (5-10 mg total) by mouth every 6 (six) hours as needed for severe pain. 30 tablet 0  . pravastatin (PRAVACHOL) 40 MG tablet TAKE ONE TABLET BY MOUTH ONCE DAILY (Patient taking differently: Take 40 mg by mouth daily. ) 90 tablet 3  . sitaGLIPtin (JANUVIA) 50 MG tablet Take 1 tablet (50 mg total) by mouth daily. 30 tablet 12   No current facility-administered medications for this visit.    Facility-Administered Medications Ordered in Other Visits  Medication Dose Route Frequency Provider Last Rate Last Dose  . heparin lock flush 100 unit/mL  500 Units Intracatheter Once PRN Heath Lark, MD      . riTUXimab (RITUXAN) 700 mg in sodium chloride 0.9 % 250 mL (2.1875 mg/mL) chemo infusion  375 mg/m2 (Treatment Plan Recorded) Intravenous Once Heath Lark, MD      . sodium chloride 0.9 % injection 10 mL  10 mL Intracatheter PRN Heath Lark, MD        PHYSICAL EXAMINATION: ECOG PERFORMANCE STATUS: 1 - Symptomatic but completely ambulatory  Vitals:   05/03/16 0836  BP: 140/62  Pulse: 65  Resp: 18  Temp: 97.6 F (36.4 C)   Filed Weights   05/03/16 0836  Weight: 161 lb (73 kg)    GENERAL:alert, no distress and comfortable SKIN: skin color, texture, turgor are normal, no rashes or significant  lesions EYES: normal, Conjunctiva are pink and non-injected, sclera clear OROPHARYNX:no exudate, no erythema and lips, buccal mucosa, and tongue normal  NECK: supple, thyroid normal size, non-tender, without nodularity LYMPH:  no palpable lymphadenopathy in the cervical, axillary or inguinal LUNGS: clear to auscultation and percussion with normal breathing effort HEART: regular rate & rhythm and no murmurs and no lower extremity edema ABDOMEN:abdomen soft, non-tender and normal bowel sounds Musculoskeletal:no cyanosis of digits and no clubbing  NEURO: alert & oriented x 3 with fluent speech, no focal motor/sensory deficits  LABORATORY DATA:  I have reviewed the data as listed    Component Value Date/Time   NA 139 05/03/2016 0819   K 4.2 05/03/2016 0819   CL 109 04/23/2016 0345   CO2 23 05/03/2016 0819   GLUCOSE 146 (H) 05/03/2016 0819   BUN 21.2 05/03/2016 0819   CREATININE 1.8 (H) 05/03/2016 0819   CALCIUM 8.9 05/03/2016 0819   PROT 6.4 05/03/2016 0819   ALBUMIN 3.3 (L) 05/03/2016 0819   AST 19 05/03/2016 0819   ALT 31 05/03/2016 0819   ALKPHOS 79 05/03/2016  0819   BILITOT 0.42 05/03/2016 0819   GFRNONAA 34 (L) 04/23/2016 0345   GFRNONAA 33 (L) 09/11/2013 0920   GFRAA 40 (L) 04/23/2016 0345   GFRAA 38 (L) 09/11/2013 0920    No results found for: SPEP, UPEP  Lab Results  Component Value Date   WBC 11.8 (H) 05/03/2016   NEUTROABS 9.3 (H) 05/03/2016   HGB 12.3 (L) 05/03/2016   HCT 37.6 (L) 05/03/2016   MCV 84.9 05/03/2016   PLT 315 05/03/2016      Chemistry      Component Value Date/Time   NA 139 05/03/2016 0819   K 4.2 05/03/2016 0819   CL 109 04/23/2016 0345   CO2 23 05/03/2016 0819   BUN 21.2 05/03/2016 0819   CREATININE 1.8 (H) 05/03/2016 0819   GLU 382 (H) 01/31/2015 1551      Component Value Date/Time   CALCIUM 8.9 05/03/2016 0819   ALKPHOS 79 05/03/2016 0819   AST 19 05/03/2016 0819   ALT 31 05/03/2016 0819   BILITOT 0.42 05/03/2016 0819      ASSESSMENT & PLAN:  Diffuse large B-cell lymphoma of lymph nodes of multiple regions ([place]) Clinically, he has no evidence of disease recurrence. I will continue rituximab. I will see him back in 2 months Next PET scan due in February 2018  Chronic kidney disease, stage III (moderate) He has chronic kidney disease and this is unrelated to the lymphoma.  Continue close monitoring. he will continue current medical management. I recommend close follow-up with primary care doctor for medication adjustment.  Adenocarcinoma of lung, stage 1, left ([place]) I review recent guidelines. He does not need adjuvant treatment. We'll monitor with periodic imaging study  Anemia in neoplastic disease This is likely due to recent treatment. The patient denies recent history of bleeding such as epistaxis, hematuria or hematochezia. He is asymptomatic from the anemia. I will observe for now.  He does not require transfusion now.   No orders of the defined types were placed in this encounter.  All questions were answered. The patient knows to call the clinic with any problems, questions or concerns. No barriers to learning was detected. I spent 25 minutes counseling the patient face to face. The total time spent in the appointment was 40 minutes and more than 50% was on counseling and review of test results     Heath Lark, MD 05/03/2016 9:30 AM

## 2016-05-03 NOTE — Telephone Encounter (Signed)
Message sent to chemo scheduler to be added. Follow up appointments scheduled per 05/03/16 los. AVS report and appointment schedule given to patient, per 05/03/16 los.

## 2016-05-03 NOTE — Telephone Encounter (Signed)
Per LOS I have scheduled appts and notified the scheduler 

## 2016-05-04 ENCOUNTER — Encounter: Payer: Self-pay | Admitting: Thoracic Surgery (Cardiothoracic Vascular Surgery)

## 2016-05-04 ENCOUNTER — Ambulatory Visit
Admission: RE | Admit: 2016-05-04 | Discharge: 2016-05-04 | Disposition: A | Discharge status: Home / Self Care | Payer: Medicare Other | Source: Ambulatory Visit | Attending: Thoracic Surgery (Cardiothoracic Vascular Surgery) | Admitting: Thoracic Surgery (Cardiothoracic Vascular Surgery)

## 2016-05-04 ENCOUNTER — Ambulatory Visit (INDEPENDENT_AMBULATORY_CARE_PROVIDER_SITE_OTHER): Payer: Self-pay | Admitting: Thoracic Surgery (Cardiothoracic Vascular Surgery)

## 2016-05-04 VITALS — BP 134/79 | HR 68 | Resp 16 | Ht 70.0 in | Wt 160.0 lb

## 2016-05-04 DIAGNOSIS — Z902 Acquired absence of lung [part of]: Secondary | ICD-10-CM

## 2016-05-04 DIAGNOSIS — C3490 Malignant neoplasm of unspecified part of unspecified bronchus or lung: Secondary | ICD-10-CM

## 2016-05-04 DIAGNOSIS — C3412 Malignant neoplasm of upper lobe, left bronchus or lung: Secondary | ICD-10-CM

## 2016-05-04 NOTE — Progress Notes (Signed)
[name]       [place] [address]             [phone]       HPI: [name] [name] returns for a scheduled postoperative follow-up visit  [name] [name] is a 65 year old man who has a history of a large cell B-cell lymphoma. He had a lung nodule which grew over time and turned out to be well-differentiated adenocarcinoma. He had a thoracoscopic left upper lobectomy and node dissection on 04/14/2016. Postoperative course was complicated by a very small but persistent air leak. We were finally able to get him home on postoperative day #12.  He feels well. His voice is still hoarse, but he thinks it is improving. He is not having any incisional pain and is not taking any pain medication. He is anxious to increase his activities.  Past Medical History:  Diagnosis Date  . Anemia   . Cancer ([place]) 2016   lymphoma  . Chronic kidney disease, stage III (moderate)   . Cramps, extremity    hx of in legs bilat; currently having issues in toes bilat   . Diabetes ([place])   . Erectile dysfunction   . Essential hypertension, benign   . History of ETOH abuse    quit 1999  . History of tobacco abuse    quit 12/2008  . Hyperparathyroidism ([place])    secondary / renal per H&P with [facility] 07/17/2014   . Pulmonary infiltrate present on computed tomography 02/20/2015  . Shortness of breath dyspnea    exertion  . Type II or unspecified type diabetes mellitus with renal manifestations, not stated as uncontrolled(250.40)     Current Outpatient Prescriptions  Medication Sig Dispense Refill  . amLODipine (NORVASC) 10 MG tablet TAKE ONE TABLET BY MOUTH ONCE DAILY (Patient taking differently: Take 10 mg by mouth daily. ) 90 tablet 3  . aspirin 81 MG tablet Take 81 mg by mouth daily.    . fish oil-omega-3 fatty acids 1000 MG capsule Take 1 g by mouth daily.    Marland Kitchen glipiZIDE (GLUCOTROL XL) 10 MG 24 hr tablet TAKE ONE TABLET BY MOUTH ONCE DAILY (Patient taking differently: Take 10 mg by  mouth daily. ) 90 tablet 3  . glucose blood (ACCU-CHEK AVIVA PLUS) test strip Use as instructed 100 each 4  . metoprolol tartrate (LOPRESSOR) 25 MG tablet Take 1 tablet (25 mg total) by mouth 2 (two) times daily. 60 tablet 1  . pravastatin (PRAVACHOL) 40 MG tablet TAKE ONE TABLET BY MOUTH ONCE DAILY (Patient taking differently: Take 40 mg by mouth daily. ) 90 tablet 3  . sitaGLIPtin (JANUVIA) 50 MG tablet Take 1 tablet (50 mg total) by mouth daily. 30 tablet 12  . oxyCODONE (OXY IR/ROXICODONE) 5 MG immediate release tablet Take 1-2 tablets (5-10 mg total) by mouth every 6 (six) hours as needed for severe pain. (Patient not taking: Reported on 05/04/2016) 30 tablet 0   No current facility-administered medications for this visit.     Physical Exam BP 134/79 (BP Location: Left Arm, Patient Position: Sitting, Cuff Size: Large)   Pulse 68   Resp 16   Ht '5\' 10"'$  (1.778 m)   Wt 160 lb (72.6 kg)   SpO2 99% Comment: RA  BMI 22.83 kg/m  65 year old man in no acute distress HEENT- hoarse Lungs diminished breath sounds at left base, otherwise clear Cardiac regular rate and rhythm normal S1 and S2 No peripheral edema Incisions clean  dry and intact  Diagnostic Tests: CHEST  2 VIEW  COMPARISON:  PA and lateral chest x-ray of April 26, 2016  FINDINGS: There is a persistent left-sided pneumothorax which has decreased slightly in volume since the previous study. A small amount of pleural fluid is noted anteriorly in the mid hemi thorax which is also stable. There is no mediastinal shift. The right lung is clear. The heart and pulmonary vascularity are normal. The power port appliance catheter tip projects over the midportion of the SVC. The gas pattern in the upper abdomen is normal. The bony thorax exhibits no acute abnormality.  IMPRESSION: Slight interval decrease in the volume of the known left-sided hydro pneumothorax. The pneumothorax now likely occupies 15% or less of the pleural  space volume.  No acute abnormality observed elsewhere within the abdomen.   Electronically Signed   By: David  Martinique M.D.   On: 05/04/2016 11:20 I personally reviewed the chest x-ray. The left sided space is decreased.  [name] is a 65 year old man who is now 3 weeks out from a thoracoscopic left upper lobectomy and lymph node dissection for a T1b, N0, stage IA adenocarcinoma.  He is recovering well from the surgery. He does not have any significant incisional pain, and is not requiring narcotics.  He has not had any issues with chest pain or shortness of breath. He is anxious to increase his physical activities.  He may begin driving, appropriate precautions were discussed. There are no restrictions on his activities, but he was advised to build into new activities gradually.  He saw Dr. Alvy Bimler. No adjuvant therapy was recommended.  He is still hoarse. This is likely due to left recurrent nerve dysfunction following node dissection in the aortopulmonary window. This will often improve over time. It is not causing him any issues currently so I'll plan to see him back in 2 months and check on his progress. If he still hoarse at that point we'll consider referral to ENT.  Plan: Return in 2 months with PA and lateral chest x-ray.  [name] Nakayama, MD [facility] [phone]

## 2016-05-07 LAB — ACID FAST CULTURE WITH REFLEXED SENSITIVITIES (MYCOBACTERIA)
Acid Fast Culture: NEGATIVE
Acid Fast Culture: NEGATIVE

## 2016-05-11 ENCOUNTER — Encounter (HOSPITAL_COMMUNITY): Payer: Self-pay

## 2016-05-11 ENCOUNTER — Ambulatory Visit (INDEPENDENT_AMBULATORY_CARE_PROVIDER_SITE_OTHER): Payer: Medicare Other | Admitting: Physician Assistant

## 2016-05-11 ENCOUNTER — Encounter: Payer: Self-pay | Admitting: Physician Assistant

## 2016-05-11 VITALS — BP 134/70 | HR 85 | Temp 97.9°F | Resp 16 | Ht 70.0 in | Wt 161.8 lb

## 2016-05-11 DIAGNOSIS — E785 Hyperlipidemia, unspecified: Secondary | ICD-10-CM | POA: Diagnosis not present

## 2016-05-11 DIAGNOSIS — C3492 Malignant neoplasm of unspecified part of left bronchus or lung: Secondary | ICD-10-CM

## 2016-05-11 DIAGNOSIS — Z1211 Encounter for screening for malignant neoplasm of colon: Secondary | ICD-10-CM | POA: Diagnosis not present

## 2016-05-11 DIAGNOSIS — C8338 Diffuse large B-cell lymphoma, lymph nodes of multiple sites: Secondary | ICD-10-CM | POA: Diagnosis not present

## 2016-05-11 DIAGNOSIS — E1122 Type 2 diabetes mellitus with diabetic chronic kidney disease: Secondary | ICD-10-CM

## 2016-05-11 DIAGNOSIS — I129 Hypertensive chronic kidney disease with stage 1 through stage 4 chronic kidney disease, or unspecified chronic kidney disease: Secondary | ICD-10-CM | POA: Diagnosis not present

## 2016-05-11 NOTE — Progress Notes (Signed)
[name], male    DOB: 21-Dec-1950, 65 y.o.   MRN: [id number]  PCP: Harrison Mons, PA-C  Subjective:   Chief Complaint  Patient presents with  . Diabetes    HPI Presents for evaluation of diabetes.  Since his last visit with me 01/27/2016 he has been diagnosed with lung cancer. This is a new primary, recall that he was previously under surveillance following treatment of Diffuse Large B-cell Lymphoma. He underwent a thoracoscopic LEFT upper lobectomy and node dissection on 04/14/2016. He remains hoarse, likely due to LEFT recurrent nerve dysfunction following node dissection in the aortopulmonary window, which is expected to improve with time. If it persists at his 2 month post-op visit, ENT evaluation is planned.  CBC, metabolic profile checked by hem/onc 8 days ago. A1C 10/09 was 6.9% Urine microalbumin 10/2015 was normal. Lipid 10/2015 with LDL 71, HDL 38, TG 121  Zostavax-has the Rx, just needs to get it Eye exam-overdue, knows he needs it Overdue for colonoscopy (last 2006), was due in 2011.   Review of Systems  Constitutional: Negative for chills and fever.  HENT: Positive for voice change.   Respiratory: Negative for cough and shortness of breath.   Cardiovascular: Negative for chest pain, palpitations and leg swelling.  Gastrointestinal: Negative for diarrhea, nausea and vomiting.  Endocrine: Negative for polydipsia.  Genitourinary: Negative for dysuria, frequency and urgency.  Musculoskeletal: Negative for myalgias.  Skin: Negative for rash.  Neurological: Negative for dizziness and headaches.       Patient Active Problem List   Diagnosis Date Noted  . Adenocarcinoma of lung, stage 1, left ([place]) 04/15/2016  . Essential hypertension, benign 03/02/2016  . Port catheter in place 10/31/2015  . Nodule of left lung 09/11/2015  . Restrictive lung disease 04/23/2015  . Reactive thrombocytosis 01/09/2015  . Elevated liver enzymes 01/09/2015  .  Leukocytosis 12/19/2014  . Thrombocytopenia due to drugs 12/11/2014  . Diffuse large B-cell lymphoma of lymph nodes of multiple regions ([place]) 11/04/2014  . Anemia in neoplastic disease 11/04/2014  . Hyperlipidemia 05/11/2013  . Hypertensive renal disease   . DM (diabetes mellitus), type 2 with renal complications ([place])   . Chronic kidney disease, stage III (moderate)   . History of tobacco abuse   . Erectile dysfunction      Prior to Admission medications   Medication Sig Start Date End Date Taking? Authorizing Provider  amLODipine (NORVASC) 10 MG tablet TAKE ONE TABLET BY MOUTH ONCE DAILY Patient taking differently: Take 10 mg by mouth daily.  01/27/16  Yes Viveca Beckstrom, PA-C  aspirin 81 MG tablet Take 81 mg by mouth daily.   Yes Historical Provider, MD  fish oil-omega-3 fatty acids 1000 MG capsule Take 1 g by mouth daily.   Yes Historical Provider, MD  glipiZIDE (GLUCOTROL XL) 10 MG 24 hr tablet TAKE ONE TABLET BY MOUTH ONCE DAILY 01/27/16  Yes Karryn Kosinski, PA-C  metoprolol tartrate (LOPRESSOR) 25 MG tablet Take 1 tablet (25 mg total) by mouth 2 (two) times daily. 04/26/16  Yes Wayne E Gold, PA-C  pravastatin (PRAVACHOL) 40 MG tablet TAKE ONE TABLET BY MOUTH ONCE DAILY Patient taking differently: Take 40 mg by mouth daily.  10/21/15  Yes Dawan Farney, PA-C  sitaGLIPtin (JANUVIA) 50 MG tablet Take 1 tablet (50 mg total) by mouth daily. 01/27/16  Yes Corlene Sabia, PA-C  glucose blood (ACCU-CHEK AVIVA PLUS) test strip Use as instructed Patient not taking: Reported on 05/11/2016 09/24/14   Harrison Mons,  PA-C  oxyCODONE (OXY IR/ROXICODONE) 5 MG immediate release tablet Take 1-2 tablets (5-10 mg total) by mouth every 6 (six) hours as needed for severe pain. Patient not taking: Reported on 05/11/2016 04/26/16   John Giovanni, PA-C     Allergies  Allergen Reactions  . Ace Inhibitors Hives  . Rituximab Other (See Comments)    This medication caused him to sweat & shake         Objective:  Physical Exam  Constitutional: He is oriented to person, place, and time. He appears well-developed and well-nourished. He is active and cooperative. No distress.  BP 134/70   Pulse 85   Temp 97.9 F (36.6 C) (Oral)   Resp 16   Ht '5\' 10"'$  (1.778 m)   Wt 161 lb 12.8 oz (73.4 kg)   SpO2 94%   BMI 23.22 kg/m   HENT:  Head: Normocephalic and atraumatic.  Right Ear: Hearing normal.  Left Ear: Hearing normal.  Eyes: Conjunctivae are normal. No scleral icterus.  Neck: Normal range of motion. Neck supple. No thyromegaly present.  Cardiovascular: Normal rate, regular rhythm and normal heart sounds.   Pulses:      Radial pulses are 2+ on the right side, and 2+ on the left side.  Pulmonary/Chest: Effort normal and breath sounds normal.  Lymphadenopathy:       Head (right side): No tonsillar, no preauricular, no posterior auricular and no occipital adenopathy present.       Head (left side): No tonsillar, no preauricular, no posterior auricular and no occipital adenopathy present.    He has no cervical adenopathy.       Right: No supraclavicular adenopathy present.       Left: No supraclavicular adenopathy present.  Neurological: He is alert and oriented to person, place, and time. No sensory deficit.  Skin: Skin is warm, dry and intact. No rash noted. No cyanosis or erythema. Nails show no clubbing.  Psychiatric: He has a normal mood and affect. His speech is normal and behavior is normal.           Assessment & Plan:   1. Type 2 diabetes mellitus with chronic kidney disease, without long-term current use of insulin, unspecified CKD stage ([place]) Remarkably well controlled, especially given the recent treatment for new lung cancer. Continue healthy eating. Stay active. Reassess in 10/2016, sooner if needed.  2. Hypertensive renal disease, stage 1-4 or unspecified chronic kidney disease I see he had a bump in the renal function around the time of his lobectomy, which  improved by discharge to baseline.  3. Hyperlipidemia, unspecified hyperlipidemia type Good control in 10/2015. Due to all the recent labs, we elect to defer this to his visit in 10/2016.  4. Adenocarcinoma of lung, stage 1, left ([facility]) Follow with Hem/ONC and thoracic surgery as planned.  5. Diffuse large B-cell lymphoma of lymph nodes of multiple regions ([place]) Follow with Hem/Onc as planned.  6. Screening for colon cancer He finally agrees to have a colonoscopy! - Ambulatory referral to Gastroenterology   Fara Chute, PA-C Physician Assistant-Certified [facility]

## 2016-05-11 NOTE — Patient Instructions (Signed)
     IF you received an x-ray today, you will receive an invoice from [facility]. Please contact [facility] at [phone] with questions or concerns regarding your invoice.   IF you received labwork today, you will receive an invoice from [facility]. Please contact [facility] at [phone] with questions or concerns regarding your invoice.   Our billing staff will not be able to assist you with questions regarding bills from these companies.  You will be contacted with the lab results as soon as they are available. The fastest way to get your results is to activate your My Chart account. Instructions are located on the last page of this paperwork. If you have not heard from us regarding the results in 2 weeks, please contact this office.      

## 2016-06-30 ENCOUNTER — Other Ambulatory Visit: Payer: Self-pay | Admitting: Thoracic Surgery (Cardiothoracic Vascular Surgery)

## 2016-06-30 DIAGNOSIS — C3490 Malignant neoplasm of unspecified part of unspecified bronchus or lung: Secondary | ICD-10-CM

## 2016-07-01 ENCOUNTER — Other Ambulatory Visit: Payer: Self-pay | Admitting: Hematology and Oncology

## 2016-07-01 DIAGNOSIS — C3492 Malignant neoplasm of unspecified part of left bronchus or lung: Secondary | ICD-10-CM

## 2016-07-01 DIAGNOSIS — C8338 Diffuse large B-cell lymphoma, lymph nodes of multiple sites: Secondary | ICD-10-CM

## 2016-07-02 ENCOUNTER — Other Ambulatory Visit (HOSPITAL_BASED_OUTPATIENT_CLINIC_OR_DEPARTMENT_OTHER): Payer: Medicare Other

## 2016-07-02 ENCOUNTER — Telehealth: Payer: Self-pay | Admitting: Hematology and Oncology

## 2016-07-02 ENCOUNTER — Ambulatory Visit: Payer: Medicare Other

## 2016-07-02 ENCOUNTER — Ambulatory Visit (HOSPITAL_BASED_OUTPATIENT_CLINIC_OR_DEPARTMENT_OTHER): Payer: Medicare Other | Admitting: Hematology and Oncology

## 2016-07-02 ENCOUNTER — Other Ambulatory Visit: Payer: Self-pay | Admitting: Hematology and Oncology

## 2016-07-02 ENCOUNTER — Encounter: Payer: Self-pay | Admitting: Hematology and Oncology

## 2016-07-02 ENCOUNTER — Ambulatory Visit (HOSPITAL_BASED_OUTPATIENT_CLINIC_OR_DEPARTMENT_OTHER): Payer: Medicare Other

## 2016-07-02 VITALS — BP 122/81 | HR 81 | Temp 97.3°F | Resp 18

## 2016-07-02 VITALS — BP 155/69 | HR 89 | Temp 97.6°F | Resp 18 | Ht 70.0 in | Wt 164.4 lb

## 2016-07-02 DIAGNOSIS — C8338 Diffuse large B-cell lymphoma, lymph nodes of multiple sites: Secondary | ICD-10-CM

## 2016-07-02 DIAGNOSIS — N183 Chronic kidney disease, stage 3 unspecified: Secondary | ICD-10-CM

## 2016-07-02 DIAGNOSIS — E1122 Type 2 diabetes mellitus with diabetic chronic kidney disease: Secondary | ICD-10-CM | POA: Diagnosis not present

## 2016-07-02 DIAGNOSIS — Z95828 Presence of other vascular implants and grafts: Secondary | ICD-10-CM

## 2016-07-02 DIAGNOSIS — C3412 Malignant neoplasm of upper lobe, left bronchus or lung: Secondary | ICD-10-CM

## 2016-07-02 DIAGNOSIS — C3492 Malignant neoplasm of unspecified part of left bronchus or lung: Secondary | ICD-10-CM

## 2016-07-02 DIAGNOSIS — L7682 Other postprocedural complications of skin and subcutaneous tissue: Secondary | ICD-10-CM | POA: Insufficient documentation

## 2016-07-02 DIAGNOSIS — Z5112 Encounter for antineoplastic immunotherapy: Secondary | ICD-10-CM | POA: Diagnosis not present

## 2016-07-02 LAB — COMPREHENSIVE METABOLIC PANEL
ALBUMIN: 3.9 g/dL (ref 3.5–5.0)
ALT: 28 U/L (ref 0–55)
AST: 20 U/L (ref 5–34)
Alkaline Phosphatase: 98 U/L (ref 40–150)
Anion Gap: 11 mEq/L (ref 3–11)
BUN: 28.3 mg/dL — AB (ref 7.0–26.0)
CALCIUM: 9.4 mg/dL (ref 8.4–10.4)
CHLORIDE: 102 meq/L (ref 98–109)
CO2: 24 mEq/L (ref 22–29)
CREATININE: 2.1 mg/dL — AB (ref 0.7–1.3)
EGFR: 37 mL/min/{1.73_m2} — ABNORMAL LOW (ref >90)
GLUCOSE: 224 mg/dL — AB (ref 70–140)
Potassium: 4 mEq/L (ref 3.5–5.1)
SODIUM: 137 meq/L (ref 136–145)
Total Bilirubin: 0.69 mg/dL (ref 0.20–1.20)
Total Protein: 6.8 g/dL (ref 6.4–8.3)

## 2016-07-02 LAB — CBC WITH DIFFERENTIAL/PLATELET
BASO%: 0.6 % (ref 0.0–2.0)
Basophils Absolute: 0.1 10*3/uL (ref 0.0–0.1)
EOS%: 1.4 % (ref 0.0–7.0)
Eosinophils Absolute: 0.2 10*3/uL (ref 0.0–0.5)
HEMATOCRIT: 41.5 % (ref 38.4–49.9)
HEMOGLOBIN: 13.9 g/dL (ref 13.0–17.1)
LYMPH#: 1.3 10*3/uL (ref 0.9–3.3)
LYMPH%: 10 % — ABNORMAL LOW (ref 14.0–49.0)
MCH: 28.1 pg (ref 27.2–33.4)
MCHC: 33.5 g/dL (ref 32.0–36.0)
MCV: 83.9 fL (ref 79.3–98.0)
MONO#: 1 10*3/uL — ABNORMAL HIGH (ref 0.1–0.9)
MONO%: 7.5 % (ref 0.0–14.0)
NEUT#: 10.4 10*3/uL — ABNORMAL HIGH (ref 1.5–6.5)
NEUT%: 80.5 % — ABNORMAL HIGH (ref 39.0–75.0)
Platelets: 208 10*3/uL (ref 140–400)
RBC: 4.94 10*6/uL (ref 4.20–5.82)
RDW: 13.9 % (ref 11.0–14.6)
WBC: 13 10*3/uL — ABNORMAL HIGH (ref 4.0–10.3)

## 2016-07-02 LAB — LACTATE DEHYDROGENASE: LDH: 179 U/L (ref 125–245)

## 2016-07-02 MED ORDER — SODIUM CHLORIDE 0.9 % IJ SOLN
10.0000 mL | INTRAMUSCULAR | Status: DC | PRN
  Administered 2016-07-02: 10 mL
  Filled 2016-07-02: qty 10

## 2016-07-02 MED ORDER — ACETAMINOPHEN 325 MG PO TABS
ORAL_TABLET | ORAL | Status: AC
  Filled 2016-07-02: qty 2

## 2016-07-02 MED ORDER — SODIUM CHLORIDE 0.9 % IJ SOLN
10.0000 mL | INTRAMUSCULAR | Status: DC | PRN
  Administered 2016-07-02: 10 mL via INTRAVENOUS
  Filled 2016-07-02: qty 10

## 2016-07-02 MED ORDER — HEPARIN SOD (PORK) LOCK FLUSH 100 UNIT/ML IV SOLN
500.0000 [IU] | Freq: Once | INTRAVENOUS | Status: AC | PRN
  Administered 2016-07-02: 500 [IU]
  Filled 2016-07-02: qty 5

## 2016-07-02 MED ORDER — DIPHENHYDRAMINE HCL 25 MG PO CAPS
50.0000 mg | ORAL_CAPSULE | Freq: Once | ORAL | Status: AC
  Administered 2016-07-02: 50 mg via ORAL

## 2016-07-02 MED ORDER — SODIUM CHLORIDE 0.9 % IV SOLN
375.0000 mg/m2 | Freq: Once | INTRAVENOUS | Status: AC
  Administered 2016-07-02: 700 mg via INTRAVENOUS
  Filled 2016-07-02: qty 50

## 2016-07-02 MED ORDER — ACETAMINOPHEN 325 MG PO TABS
650.0000 mg | ORAL_TABLET | Freq: Once | ORAL | Status: AC
  Administered 2016-07-02: 650 mg via ORAL

## 2016-07-02 MED ORDER — DIPHENHYDRAMINE HCL 25 MG PO CAPS
ORAL_CAPSULE | ORAL | Status: AC
  Filled 2016-07-02: qty 2

## 2016-07-02 MED ORDER — SODIUM CHLORIDE 0.9 % IV SOLN
Freq: Once | INTRAVENOUS | Status: AC
  Administered 2016-07-02: 10:00:00 via INTRAVENOUS

## 2016-07-02 NOTE — Assessment & Plan Note (Signed)
Clinically, he has no evidence of disease recurrence. I will continue rituximab. I will see him back in 2 months Next PET scan due in February 2018

## 2016-07-02 NOTE — Telephone Encounter (Signed)
Appointments scheduled per 07/02/16 los. Patient was given a copy of the appointment schedule and AVS report, per 07/02/16 los.

## 2016-07-02 NOTE — Assessment & Plan Note (Signed)
His blood sugar is stable. Continue medical management

## 2016-07-02 NOTE — Assessment & Plan Note (Signed)
He has chronic kidney disease and this is unrelated to the lymphoma.  Continue close monitoring. he will continue current medical management. I recommend close follow-up with primary care doctor for medication adjustment. 

## 2016-07-02 NOTE — Assessment & Plan Note (Signed)
He continues to have incisional pain from prior surgery and mild hoarseness. Does not bother him. I recommend over-the-counter analgesics for pain as needed

## 2016-07-02 NOTE — Assessment & Plan Note (Signed)
I review recent guidelines. He does not need adjuvant treatment. We'll monitor with periodic imaging study

## 2016-07-02 NOTE — Progress Notes (Signed)
[facility] OFFICE PROGRESS NOTE  Patient Care Team: Harrison Mons, PA-C as PCP - General (Physician Assistant) Corliss Parish, MD as Consulting Physician (Nephrology) Fanny Skates, MD as Consulting Physician (General Surgery) Heath Lark, MD as Consulting Physician (Hematology and Oncology) Javier Glazier, MD as Consulting Physician (Pulmonary Disease) Juanita Craver, MD as Consulting Physician (Gastroenterology)  SUMMARY OF ONCOLOGIC HISTORY: Oncology History   Lymphoma   Staging form: Lymphoid Neoplasms, AJCC 6th Edition     Clinical stage from 11/21/2014: Stage IV - Signed by Heath Lark, MD on 11/21/2014       Diffuse large B-cell lymphoma of lymph nodes of multiple regions ([place])   11/13/2014 Imaging    PEt scan showed widespread disease including possible splenic involvement      11/19/2014 Surgery    He has excisional LN biopsy and port placement      11/19/2014 Pathology Results    Biopsy of lymph nodes showed DLBCL      11/29/2014 - 01/31/2015 Chemotherapy    He received 4 cycles of RCHOP chemotherapy complicated by severe infusion reaction.      12/19/2014 - 01/09/2015 Chemotherapy    He received 2 cycles of prophylactic IT chemotherapy.      01/31/2015 Adverse Reaction    I reduce the dose of his chemotherapy, prednisone and dexamethasone due to poorly controlled diabetes and hyperglycemia      02/12/2015 Imaging     repeat PET CT scan show complete response to chemotherapy but new lung infiltrates      09/09/2015 Imaging    PET CT showed complete response except for new inflammatory change in the lung      03/01/2016 Imaging    CT chest showed mild interval growth of subsolid left upper lobe pulmonary nodule, which was hypermetabolic on 31/54/0086 PET-CT. A primary bronchogenic adenocarcinoma cannot be excluded. Thoracic surgical consultation is advised. No lymphadenopathy or other findings of metastatic disease in the chest. Moderate emphysema  with mild diffuse bronchial wall thickening, suggesting COPD. Aortic atherosclerosis. Stable small pericardial effusion/thickening.       Adenocarcinoma of lung, stage 1, left ([place])   03/24/2016 Procedure    He underwent electromagnetic navigational bronchoscopy with needle aspirations, brushings, and biopsies.      03/24/2016 Pathology Results    Accession: [id number] biopsy was positive for adenocarcinoma      04/15/2016 Initial Diagnosis    Adenocarcinoma of lung, stage 1, left ([place])     04/15/2016 Surgery    He underwent left video-assisted thoracoscopy, thoracoscopic left upper lobectomy, mediastinal lymph node dissection, on-Q local anesthetic catheter placement      04/15/2016 Pathology Results    Accession: [id number] surgical specimen revealed adenocarcinoma, 2.2 cm with negative margin and negative LN involvement       INTERVAL HISTORY: Please see below for problem oriented charting. He is seen today for rituximab treatment. He is recovering well from prior surgery. He continues to have mild incisional pain on the chest wall and hoarseness. It is not severe. Denies recent infection. No new lymphadenopathy.  REVIEW OF SYSTEMS:   Constitutional: Denies fevers, chills or abnormal weight loss Eyes: Denies blurriness of vision Ears, nose, mouth, throat, and face: Denies mucositis or sore throat Respiratory: Denies cough, dyspnea or wheezes Cardiovascular: Denies palpitation, chest discomfort or lower extremity swelling Gastrointestinal:  Denies nausea, heartburn or change in bowel habits Skin: Denies abnormal skin rashes Lymphatics: Denies new lymphadenopathy or easy bruising Neurological:Denies numbness, tingling or new weaknesses Behavioral/Psych: Mood  is stable, no new changes  All other systems were reviewed with the patient and are negative.  I have reviewed the past medical history, past surgical history, social history and family history with the patient  and they are unchanged from previous note.  ALLERGIES:  is allergic to ace inhibitors and rituximab.  MEDICATIONS:  Current Outpatient Prescriptions  Medication Sig Dispense Refill  . amLODipine (NORVASC) 10 MG tablet TAKE ONE TABLET BY MOUTH ONCE DAILY (Patient taking differently: Take 10 mg by mouth daily. ) 90 tablet 3  . aspirin 81 MG tablet Take 81 mg by mouth daily.    . fish oil-omega-3 fatty acids 1000 MG capsule Take 1 g by mouth daily.    Marland Kitchen glipiZIDE (GLUCOTROL XL) 10 MG 24 hr tablet TAKE ONE TABLET BY MOUTH ONCE DAILY 90 tablet 3  . glucose blood (ACCU-CHEK AVIVA PLUS) test strip Use as instructed (Patient not taking: Reported on 05/11/2016) 100 each 4  . metoprolol tartrate (LOPRESSOR) 25 MG tablet Take 1 tablet (25 mg total) by mouth 2 (two) times daily. 60 tablet 1  . oxyCODONE (OXY IR/ROXICODONE) 5 MG immediate release tablet Take 1-2 tablets (5-10 mg total) by mouth every 6 (six) hours as needed for severe pain. (Patient not taking: Reported on 05/11/2016) 30 tablet 0  . pravastatin (PRAVACHOL) 40 MG tablet TAKE ONE TABLET BY MOUTH ONCE DAILY (Patient taking differently: Take 40 mg by mouth daily. ) 90 tablet 3  . sitaGLIPtin (JANUVIA) 50 MG tablet Take 1 tablet (50 mg total) by mouth daily. 30 tablet 12   No current facility-administered medications for this visit.    Facility-Administered Medications Ordered in Other Visits  Medication Dose Route Frequency Provider Last Rate Last Dose  . heparin lock flush 100 unit/mL  500 Units Intracatheter Once PRN Heath Lark, MD      . sodium chloride 0.9 % injection 10 mL  10 mL Intracatheter PRN Heath Lark, MD        PHYSICAL EXAMINATION: ECOG PERFORMANCE STATUS: 1 - Symptomatic but completely ambulatory  Vitals:   07/02/16 0856  BP: (!) 155/69  Pulse: 89  Resp: 18  Temp: 97.6 F (36.4 C)   Filed Weights   07/02/16 0856  Weight: 164 lb 6.4 oz (74.6 kg)    GENERAL:alert, no distress and comfortable SKIN: skin color,  texture, turgor are normal, no rashes or significant lesions EYES: normal, Conjunctiva are pink and non-injected, sclera clear OROPHARYNX:no exudate, no erythema and lips, buccal mucosa, and tongue normal  NECK: supple, thyroid normal size, non-tender, without nodularity LYMPH:  no palpable lymphadenopathy in the cervical, axillary or inguinal LUNGS: clear to auscultation and percussion with normal breathing effort HEART: regular rate & rhythm and no murmurs and no lower extremity edema ABDOMEN:abdomen soft, non-tender and normal bowel sounds Musculoskeletal:no cyanosis of digits and no clubbing  NEURO: alert & oriented x 3 with fluent speech, no focal motor/sensory deficits. Noted hoarseness  LABORATORY DATA:  I have reviewed the data as listed    Component Value Date/Time   NA 137 07/02/2016 0819   K 4.0 07/02/2016 0819   CL 109 04/23/2016 0345   CO2 24 07/02/2016 0819   GLUCOSE 224 (H) 07/02/2016 0819   BUN 28.3 (H) 07/02/2016 0819   CREATININE 2.1 (H) 07/02/2016 0819   CALCIUM 9.4 07/02/2016 0819   PROT 6.8 07/02/2016 0819   ALBUMIN 3.9 07/02/2016 0819   AST 20 07/02/2016 0819   ALT 28 07/02/2016 0819   ALKPHOS 98  07/02/2016 0819   BILITOT 0.69 07/02/2016 0819   GFRNONAA 34 (L) 04/23/2016 0345   GFRNONAA 33 (L) 09/11/2013 0920   GFRAA 40 (L) 04/23/2016 0345   GFRAA 38 (L) 09/11/2013 0920    No results found for: SPEP, UPEP  Lab Results  Component Value Date   WBC 13.0 (H) 07/02/2016   NEUTROABS 10.4 (H) 07/02/2016   HGB 13.9 07/02/2016   HCT 41.5 07/02/2016   MCV 83.9 07/02/2016   PLT 208 07/02/2016      Chemistry      Component Value Date/Time   NA 137 07/02/2016 0819   K 4.0 07/02/2016 0819   CL 109 04/23/2016 0345   CO2 24 07/02/2016 0819   BUN 28.3 (H) 07/02/2016 0819   CREATININE 2.1 (H) 07/02/2016 0819   GLU 382 (H) 01/31/2015 1551      Component Value Date/Time   CALCIUM 9.4 07/02/2016 0819   ALKPHOS 98 07/02/2016 0819   AST 20 07/02/2016 0819    ALT 28 07/02/2016 0819   BILITOT 0.69 07/02/2016 0819      ASSESSMENT & PLAN:  Diffuse large B-cell lymphoma of lymph nodes of multiple regions ([place]) Clinically, he has no evidence of disease recurrence. I will continue rituximab. I will see him back in 2 months Next PET scan due in February 2018  Adenocarcinoma of lung, stage 1, left ([place]) I review recent guidelines. He does not need adjuvant treatment. We'll monitor with periodic imaging study  Chronic kidney disease, stage III (moderate) He has chronic kidney disease and this is unrelated to the lymphoma.  Continue close monitoring. he will continue current medical management. I recommend close follow-up with primary care doctor for medication adjustment.  DM (diabetes mellitus), type 2 with renal complications His blood sugar is stable. Continue medical management  Incisional pain He continues to have incisional pain from prior surgery and mild hoarseness. Does not bother him. I recommend over-the-counter analgesics for pain as needed   Orders Placed This Encounter  Procedures  . NM PET Image Restag (PS) Skull Base To Thigh    Standing Status:   Future    Standing Expiration Date:   08/06/2017    Order Specific Question:   Reason for exam:    Answer:   lung cancer and lymphoma, exclude recurrence    Order Specific Question:   Preferred imaging location?    Answer:   [facility]  . CBC with Differential/Platelet    Standing Status:   Future    Standing Expiration Date:   08/06/2017  . Comprehensive metabolic panel    Standing Status:   Future    Standing Expiration Date:   08/06/2017  . Lactate dehydrogenase    Standing Status:   Future    Standing Expiration Date:   08/06/2017   All questions were answered. The patient knows to call the clinic with any problems, questions or concerns. No barriers to learning was detected. I spent 25 minutes counseling the patient face to face. The total time spent in the  appointment was 30 minutes and more than 50% was on counseling and review of test results     Heath Lark, MD 07/02/2016 10:41 AM

## 2016-07-02 NOTE — Patient Instructions (Signed)
[facility] Discharge Instructions for Patients Receiving Chemotherapy  Today you received the following chemotherapy agents Rituxan To help prevent nausea and vomiting after your treatment, we encourage you to take your nausea medication as prescribed.  If you develop nausea and vomiting that is not controlled by your nausea medication, call the clinic.   BELOW ARE SYMPTOMS THAT SHOULD BE REPORTED IMMEDIATELY:  *FEVER GREATER THAN 100.5 F  *CHILLS WITH OR WITHOUT FEVER  NAUSEA AND VOMITING THAT IS NOT CONTROLLED WITH YOUR NAUSEA MEDICATION  *UNUSUAL SHORTNESS OF BREATH  *UNUSUAL BRUISING OR BLEEDING  TENDERNESS IN MOUTH AND THROAT WITH OR WITHOUT PRESENCE OF ULCERS  *URINARY PROBLEMS  *BOWEL PROBLEMS  UNUSUAL RASH Items with * indicate a potential emergency and should be followed up as soon as possible.  Feel free to call the clinic you have any questions or concerns. The clinic phone number is [phone].  Please show the CHEMO ALERT CARD at check-in to the Emergency Department and triage nurse.   

## 2016-07-06 ENCOUNTER — Ambulatory Visit
Admission: RE | Admit: 2016-07-06 | Discharge: 2016-07-06 | Disposition: A | Discharge status: Home / Self Care | Payer: Medicare Other | Source: Ambulatory Visit | Attending: Thoracic Surgery (Cardiothoracic Vascular Surgery) | Admitting: Thoracic Surgery (Cardiothoracic Vascular Surgery)

## 2016-07-06 ENCOUNTER — Encounter: Payer: Self-pay | Admitting: Thoracic Surgery (Cardiothoracic Vascular Surgery)

## 2016-07-06 ENCOUNTER — Ambulatory Visit (INDEPENDENT_AMBULATORY_CARE_PROVIDER_SITE_OTHER): Payer: Self-pay | Admitting: Thoracic Surgery (Cardiothoracic Vascular Surgery)

## 2016-07-06 VITALS — BP 134/84 | HR 90 | Resp 16 | Ht 70.0 in | Wt 164.0 lb

## 2016-07-06 DIAGNOSIS — C3412 Malignant neoplasm of upper lobe, left bronchus or lung: Secondary | ICD-10-CM

## 2016-07-06 DIAGNOSIS — C3490 Malignant neoplasm of unspecified part of unspecified bronchus or lung: Secondary | ICD-10-CM

## 2016-07-06 DIAGNOSIS — Z902 Acquired absence of lung [part of]: Secondary | ICD-10-CM

## 2016-07-06 NOTE — Progress Notes (Signed)
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 He had a thoracoscopic left upper lobectomy and node dissection on 04/14/2016. This was a stage IA well-differentiated adenocarcinoma.   He had a persistent air leak postop which ultimately resolved. He also had hoarseness postoperatively.  He saw Dr. Alvy Bimler last week. No adjuvant therapy is indicated.  Overall he feels well. He's not having any problems with his breathing. He says his hoarseness has improved and it does not bother him.  Past Medical History:  Diagnosis Date  . Anemia   . Cancer ([place]) 2016   lymphoma  . Chronic kidney disease, stage III (moderate)   . Cramps, extremity    hx of in legs bilat; currently having issues in toes bilat   . Diabetes ([place])   . Erectile dysfunction   . Essential hypertension, benign   . History of ETOH abuse    quit 1999  . History of tobacco abuse    quit 12/2008  . Hyperparathyroidism ([place])    secondary / renal per H&P with [facility] 07/17/2014   . Pulmonary infiltrate present on computed tomography 02/20/2015  . Shortness of breath dyspnea    exertion  . Type II or unspecified type diabetes mellitus with renal manifestations, not stated as uncontrolled(250.40)       Current Outpatient Prescriptions  Medication Sig Dispense Refill  . amLODipine (NORVASC) 10 MG tablet TAKE ONE TABLET BY MOUTH ONCE DAILY (Patient taking differently: Take 10 mg by mouth daily. ) 90 tablet 3  . aspirin 81 MG tablet Take 81 mg by mouth daily.    . fish oil-omega-3 fatty acids 1000 MG capsule Take 1 g by mouth daily.    Marland Kitchen glipiZIDE (GLUCOTROL XL) 10 MG 24 hr tablet TAKE ONE TABLET BY MOUTH ONCE DAILY 90 tablet 3  . pravastatin (PRAVACHOL) 40 MG tablet TAKE ONE TABLET BY MOUTH ONCE DAILY  (Patient taking differently: Take 40 mg by mouth daily. ) 90 tablet 3  . sitaGLIPtin (JANUVIA) 50 MG tablet Take 1 tablet (50 mg total) by mouth daily. 30 tablet 12  . glucose blood (ACCU-CHEK AVIVA PLUS) test strip Use as instructed (Patient not taking: Reported on 07/06/2016) 100 each 4  . metoprolol tartrate (LOPRESSOR) 25 MG tablet Take 1 tablet (25 mg total) by mouth 2 (two) times daily. (Patient not taking: Reported on 07/06/2016) 60 tablet 1   No current facility-administered medications for this visit.     Physical Exam BP 134/84 (BP Location: Right Arm, Patient Position: Sitting, Cuff Size: Large)   Pulse 90   Resp 16   Ht '5\' 10"'$  (1.778 m)   Wt 164 lb (74.4 kg)   SpO2 99% Comment: ON RA  BMI 23.51 kg/m  66 year old man in no acute distress Hoarse Alert and oriented 3 with no focal motor deficits Cardiac regular rate and rhythm normal S1 and S2 Lungs clear with equal breath is bilaterally Incisions well healed  Diagnostic Tests: CHEST  2 VIEW  COMPARISON:  Chest radiographs 05/04/2016.  Chest CT 03/23/2016  FINDINGS: The small left hydro-pneumothorax seen in October has resolved. Stable lung volumes. Mediastinal contours are stable and within  normal limits. Stable right chest subclavian approach porta cath. Stable small right lateral chest wall or axilla surgical clips. No pneumothorax, pulmonary edema, pleural effusion or consolidation. Minimal streaky left lung base opacity is stable and appears to relate to either chronic scarring or atelectasis. No acute osseous abnormality identified. Negative visible bowel gas pattern.  IMPRESSION: Satisfactory post operative appearance of the chest. No acute cardiopulmonary abnormality.   Electronically Signed   By: Genevie Ann M.D.   On: 07/06/2016 08:30 I personally reviewed the chest x-ray and concur with the findings noted above  Impression: 66 year old man who is now 3 months out from a thoracoscopic left upper  lobectomy and mediastinal lymph node dissection for stage IA adenocarcinoma. Overall he is doing extremely well. He has some mild tenderness around the incision but does not require any pain medication.  He did not require adjuvant therapy  Hoarseness- likely recurrent nerve injury with lymph node dissection. I offered to refer him to ENT to evaluate. He feels that the hoarseness is improving and it is not bothering him and he does not want any additional workup at this point in time.  Hypertension- he ran out of his metoprolol about 2 weeks ago. He has started back on losartan and hydrochlorothiazide which he was on preoperatively. His blood pressure is recently well controlled this time. We will not refill Lopressor at this time.  Plan: He is scheduled to see Dr. Alvy Bimler again in late February. I will defer to her for his primary follow-up and she will also be following him for his lymphoma.  I would like to see him back one more time in 9 months for one year follow-up visit.  He knows he can call anytime if he has any concerns or issues.  Melrose Nakayama, MD [facility] [phone]

## 2016-08-30 ENCOUNTER — Encounter (HOSPITAL_COMMUNITY)
Admission: RE | Admit: 2016-08-30 | Discharge: 2016-08-30 | Disposition: A | Discharge status: Home / Self Care | Payer: Medicare Other | Source: Ambulatory Visit | Attending: Hematology and Oncology | Admitting: Hematology and Oncology

## 2016-08-30 DIAGNOSIS — C8338 Diffuse large B-cell lymphoma, lymph nodes of multiple sites: Secondary | ICD-10-CM | POA: Insufficient documentation

## 2016-08-30 DIAGNOSIS — C3492 Malignant neoplasm of unspecified part of left bronchus or lung: Secondary | ICD-10-CM | POA: Diagnosis present

## 2016-08-30 LAB — GLUCOSE, CAPILLARY: Glucose-Capillary: 171 mg/dL — ABNORMAL HIGH (ref 65–99)

## 2016-08-30 MED ORDER — FLUDEOXYGLUCOSE F - 18 (FDG) INJECTION
8.1200 | Freq: Once | INTRAVENOUS | Status: AC | PRN
  Administered 2016-08-30: 8.12 via INTRAVENOUS

## 2016-08-31 ENCOUNTER — Encounter: Payer: Self-pay | Admitting: Hematology and Oncology

## 2016-08-31 ENCOUNTER — Telehealth: Payer: Self-pay | Admitting: Hematology and Oncology

## 2016-08-31 ENCOUNTER — Ambulatory Visit (HOSPITAL_BASED_OUTPATIENT_CLINIC_OR_DEPARTMENT_OTHER): Payer: Medicare Other

## 2016-08-31 ENCOUNTER — Ambulatory Visit (HOSPITAL_BASED_OUTPATIENT_CLINIC_OR_DEPARTMENT_OTHER): Payer: Medicare Other | Admitting: Hematology and Oncology

## 2016-08-31 ENCOUNTER — Other Ambulatory Visit (HOSPITAL_BASED_OUTPATIENT_CLINIC_OR_DEPARTMENT_OTHER): Payer: Medicare Other

## 2016-08-31 ENCOUNTER — Ambulatory Visit: Payer: Medicare Other

## 2016-08-31 VITALS — BP 147/83 | HR 81 | Temp 98.0°F | Resp 17

## 2016-08-31 DIAGNOSIS — C3412 Malignant neoplasm of upper lobe, left bronchus or lung: Secondary | ICD-10-CM

## 2016-08-31 DIAGNOSIS — N183 Chronic kidney disease, stage 3 unspecified: Secondary | ICD-10-CM

## 2016-08-31 DIAGNOSIS — C8338 Diffuse large B-cell lymphoma, lymph nodes of multiple sites: Secondary | ICD-10-CM | POA: Diagnosis not present

## 2016-08-31 DIAGNOSIS — E1122 Type 2 diabetes mellitus with diabetic chronic kidney disease: Secondary | ICD-10-CM

## 2016-08-31 DIAGNOSIS — C3492 Malignant neoplasm of unspecified part of left bronchus or lung: Secondary | ICD-10-CM

## 2016-08-31 DIAGNOSIS — E119 Type 2 diabetes mellitus without complications: Secondary | ICD-10-CM | POA: Diagnosis not present

## 2016-08-31 DIAGNOSIS — Z5112 Encounter for antineoplastic immunotherapy: Secondary | ICD-10-CM | POA: Diagnosis not present

## 2016-08-31 DIAGNOSIS — Z95828 Presence of other vascular implants and grafts: Secondary | ICD-10-CM

## 2016-08-31 LAB — COMPREHENSIVE METABOLIC PANEL
ALT: 30 U/L (ref 0–55)
ANION GAP: 9 meq/L (ref 3–11)
AST: 21 U/L (ref 5–34)
Albumin: 4 g/dL (ref 3.5–5.0)
Alkaline Phosphatase: 99 U/L (ref 40–150)
BUN: 19.8 mg/dL (ref 7.0–26.0)
CHLORIDE: 103 meq/L (ref 98–109)
CO2: 25 meq/L (ref 22–29)
Calcium: 9.4 mg/dL (ref 8.4–10.4)
Creatinine: 2 mg/dL — ABNORMAL HIGH (ref 0.7–1.3)
EGFR: 39 mL/min/{1.73_m2} — AB (ref >90)
Glucose: 238 mg/dl — ABNORMAL HIGH (ref 70–140)
Potassium: 4.2 mEq/L (ref 3.5–5.1)
Sodium: 137 mEq/L (ref 136–145)
Total Bilirubin: 0.6 mg/dL (ref 0.20–1.20)
Total Protein: 6.9 g/dL (ref 6.4–8.3)

## 2016-08-31 LAB — CBC WITH DIFFERENTIAL/PLATELET
BASO%: 0.6 % (ref 0.0–2.0)
Basophils Absolute: 0.1 10*3/uL (ref 0.0–0.1)
EOS ABS: 0.1 10*3/uL (ref 0.0–0.5)
EOS%: 1.4 % (ref 0.0–7.0)
HCT: 40.2 % (ref 38.4–49.9)
HGB: 13.6 g/dL (ref 13.0–17.1)
LYMPH%: 10.8 % — AB (ref 14.0–49.0)
MCH: 27.9 pg (ref 27.2–33.4)
MCHC: 33.8 g/dL (ref 32.0–36.0)
MCV: 82.5 fL (ref 79.3–98.0)
MONO#: 0.6 10*3/uL (ref 0.1–0.9)
MONO%: 6 % (ref 0.0–14.0)
NEUT#: 8.8 10*3/uL — ABNORMAL HIGH (ref 1.5–6.5)
NEUT%: 81.2 % — AB (ref 39.0–75.0)
PLATELETS: 212 10*3/uL (ref 140–400)
RBC: 4.87 10*6/uL (ref 4.20–5.82)
RDW: 14.5 % (ref 11.0–14.6)
WBC: 10.8 10*3/uL — ABNORMAL HIGH (ref 4.0–10.3)
lymph#: 1.2 10*3/uL (ref 0.9–3.3)

## 2016-08-31 LAB — LACTATE DEHYDROGENASE: LDH: 251 U/L — ABNORMAL HIGH (ref 125–245)

## 2016-08-31 MED ORDER — ACETAMINOPHEN 325 MG PO TABS
650.0000 mg | ORAL_TABLET | Freq: Once | ORAL | Status: AC
  Administered 2016-08-31: 650 mg via ORAL

## 2016-08-31 MED ORDER — DIPHENHYDRAMINE HCL 25 MG PO CAPS
ORAL_CAPSULE | ORAL | Status: AC
  Filled 2016-08-31: qty 2

## 2016-08-31 MED ORDER — DIPHENHYDRAMINE HCL 25 MG PO CAPS
50.0000 mg | ORAL_CAPSULE | Freq: Once | ORAL | Status: AC
  Administered 2016-08-31: 50 mg via ORAL

## 2016-08-31 MED ORDER — HEPARIN SOD (PORK) LOCK FLUSH 100 UNIT/ML IV SOLN
500.0000 [IU] | Freq: Once | INTRAVENOUS | Status: AC | PRN
  Administered 2016-08-31: 500 [IU]
  Filled 2016-08-31: qty 5

## 2016-08-31 MED ORDER — ACETAMINOPHEN 325 MG PO TABS
ORAL_TABLET | ORAL | Status: AC
  Filled 2016-08-31: qty 2

## 2016-08-31 MED ORDER — SODIUM CHLORIDE 0.9 % IV SOLN
Freq: Once | INTRAVENOUS | Status: AC
  Administered 2016-08-31: 09:00:00 via INTRAVENOUS

## 2016-08-31 MED ORDER — SODIUM CHLORIDE 0.9 % IV SOLN
375.0000 mg/m2 | Freq: Once | INTRAVENOUS | Status: AC
  Administered 2016-08-31: 700 mg via INTRAVENOUS
  Filled 2016-08-31: qty 50

## 2016-08-31 MED ORDER — SODIUM CHLORIDE 0.9 % IJ SOLN
10.0000 mL | INTRAMUSCULAR | Status: DC | PRN
  Administered 2016-08-31: 10 mL
  Filled 2016-08-31: qty 10

## 2016-08-31 MED ORDER — SODIUM CHLORIDE 0.9 % IJ SOLN
10.0000 mL | INTRAMUSCULAR | Status: DC | PRN
  Administered 2016-08-31: 10 mL via INTRAVENOUS
  Filled 2016-08-31: qty 10

## 2016-08-31 NOTE — Patient Instructions (Signed)
Cancer Center Discharge Instructions for Patients Receiving Chemotherapy  Today you received the following chemotherapy agents Rituxan To help prevent nausea and vomiting after your treatment, we encourage you to take your nausea medication as prescribed.  If you develop nausea and vomiting that is not controlled by your nausea medication, call the clinic.   BELOW ARE SYMPTOMS THAT SHOULD BE REPORTED IMMEDIATELY:  *FEVER GREATER THAN 100.5 F  *CHILLS WITH OR WITHOUT FEVER  NAUSEA AND VOMITING THAT IS NOT CONTROLLED WITH YOUR NAUSEA MEDICATION  *UNUSUAL SHORTNESS OF BREATH  *UNUSUAL BRUISING OR BLEEDING  TENDERNESS IN MOUTH AND THROAT WITH OR WITHOUT PRESENCE OF ULCERS  *URINARY PROBLEMS  *BOWEL PROBLEMS  UNUSUAL RASH Items with * indicate a potential emergency and should be followed up as soon as possible.  Feel free to call the clinic you have any questions or concerns. The clinic phone number is [phone].  Please show the CHEMO ALERT CARD at check-in to the Emergency Department and triage nurse.   

## 2016-08-31 NOTE — Telephone Encounter (Signed)
Lab, flush, chemo and follow up appointments scheduled per 08/31/16 los. Patient was paged to Infusion, will pick up a copy of an updated appointment schedule from Infusion area after treatment.

## 2016-08-31 NOTE — Assessment & Plan Note (Signed)
He has chronic kidney disease and this is unrelated to the lymphoma.  Continue close monitoring. he will continue current medical management. I recommend close follow-up with primary care doctor for medication adjustment. 

## 2016-08-31 NOTE — Assessment & Plan Note (Signed)
His blood sugar is stable. Continue medical management

## 2016-08-31 NOTE — Assessment & Plan Note (Signed)
Clinically, he has no evidence of disease recurrence. I will continue rituximab. I will see him back in 2 months Next PET scan due in August 2018

## 2016-08-31 NOTE — Assessment & Plan Note (Signed)
I review recent guidelines. He does not need adjuvant treatment. We'll monitor with periodic imaging study PET CT scan from 08/30/2016 show no evidence of disease.  We will repeat another imaging study in 6 months, due around the end of August 2018 

## 2016-08-31 NOTE — Progress Notes (Signed)
[facility] OFFICE PROGRESS NOTE  Patient Care Team: Harrison Mons, PA-C as PCP - General (Physician Assistant) Corliss Parish, MD as Consulting Physician (Nephrology) Fanny Skates, MD as Consulting Physician (General Surgery) Heath Lark, MD as Consulting Physician (Hematology and Oncology) Javier Glazier, MD as Consulting Physician (Pulmonary Disease) Juanita Craver, MD as Consulting Physician (Gastroenterology)  SUMMARY OF ONCOLOGIC HISTORY: Oncology History   Lymphoma   Staging form: Lymphoid Neoplasms, AJCC 6th Edition     Clinical stage from 11/21/2014: Stage IV - Signed by Heath Lark, MD on 11/21/2014       Diffuse large B-cell lymphoma of lymph nodes of multiple regions ([place])   11/13/2014 Imaging    PEt scan showed widespread disease including possible splenic involvement      11/19/2014 Surgery    He has excisional LN biopsy and port placement      11/19/2014 Pathology Results    Biopsy of lymph nodes showed DLBCL      11/29/2014 - 01/31/2015 Chemotherapy    He received 4 cycles of RCHOP chemotherapy complicated by severe infusion reaction.      12/19/2014 - 01/09/2015 Chemotherapy    He received 2 cycles of prophylactic IT chemotherapy.      01/31/2015 Adverse Reaction    I reduce the dose of his chemotherapy, prednisone and dexamethasone due to poorly controlled diabetes and hyperglycemia      02/12/2015 Imaging     repeat PET CT scan show complete response to chemotherapy but new lung infiltrates      09/09/2015 Imaging    PET CT showed complete response except for new inflammatory change in the lung      03/01/2016 Imaging    CT chest showed mild interval growth of subsolid left upper lobe pulmonary nodule, which was hypermetabolic on 90/24/0973 PET-CT. A primary bronchogenic adenocarcinoma cannot be excluded. Thoracic surgical consultation is advised. No lymphadenopathy or other findings of metastatic disease in the chest. Moderate emphysema  with mild diffuse bronchial wall thickening, suggesting COPD. Aortic atherosclerosis. Stable small pericardial effusion/thickening.      08/30/2016 PET scan    No evidence of hypermetabolic residual or recurrent lymphoma. 2. Interval left upper lobe wedge resection for adenocarcinoma. Healing left rib fractures which are likely postsurgical. 3. Aortic atherosclerosis.       Adenocarcinoma of lung, stage 1, left ([place])   03/24/2016 Procedure    He underwent electromagnetic navigational bronchoscopy with needle aspirations, brushings, and biopsies.      03/24/2016 Pathology Results    Accession: [id number] biopsy was positive for adenocarcinoma      04/15/2016 Initial Diagnosis    Adenocarcinoma of lung, stage 1, left ([place])     04/15/2016 Surgery    He underwent left video-assisted thoracoscopy, thoracoscopic left upper lobectomy, mediastinal lymph node dissection, on-Q local anesthetic catheter placement      04/15/2016 Pathology Results    Accession: [id number] surgical specimen revealed adenocarcinoma, 2.2 cm with negative margin and negative LN involvement       INTERVAL HISTORY: Please see below for problem oriented charting. He is seen prior to cycle 9 of treatment. He feels well. Denies recent cough, chest pain or shortness of breath. Denies recent infection. No new lymphadenopathy.  REVIEW OF SYSTEMS:   Constitutional: Denies fevers, chills or abnormal weight loss Eyes: Denies blurriness of vision Ears, nose, mouth, throat, and face: Denies mucositis or sore throat Respiratory: Denies cough, dyspnea or wheezes Cardiovascular: Denies palpitation, chest discomfort or lower extremity swelling Gastrointestinal:  Denies nausea, heartburn or change in bowel habits Skin: Denies abnormal skin rashes Lymphatics: Denies new lymphadenopathy or easy bruising Neurological:Denies numbness, tingling or new weaknesses Behavioral/Psych: Mood is stable, no new changes  All other  systems were reviewed with the patient and are negative.  I have reviewed the past medical history, past surgical history, social history and family history with the patient and they are unchanged from previous note.  ALLERGIES:  is allergic to ace inhibitors and rituximab.  MEDICATIONS:  Current Outpatient Prescriptions  Medication Sig Dispense Refill  . aspirin 81 MG tablet Take 81 mg by mouth daily.    . fish oil-omega-3 fatty acids 1000 MG capsule Take 1 g by mouth daily.    Marland Kitchen glipiZIDE (GLUCOTROL XL) 10 MG 24 hr tablet TAKE ONE TABLET BY MOUTH ONCE DAILY 90 tablet 3  . glucose blood (ACCU-CHEK AVIVA PLUS) test strip Use as instructed 100 each 4  . metoprolol tartrate (LOPRESSOR) 25 MG tablet Take 1 tablet (25 mg total) by mouth 2 (two) times daily. 60 tablet 1  . pravastatin (PRAVACHOL) 40 MG tablet TAKE ONE TABLET BY MOUTH ONCE DAILY (Patient taking differently: Take 40 mg by mouth daily. ) 90 tablet 3  . sitaGLIPtin (JANUVIA) 50 MG tablet Take 1 tablet (50 mg total) by mouth daily. 30 tablet 12  . amLODipine (NORVASC) 10 MG tablet TAKE ONE TABLET BY MOUTH ONCE DAILY (Patient taking differently: Take 10 mg by mouth daily. ) 90 tablet 3   No current facility-administered medications for this visit.     PHYSICAL EXAMINATION: ECOG PERFORMANCE STATUS: 0 - Asymptomatic  Vitals:   08/31/16 0846  BP: (!) 146/64  Pulse: 84  Resp: 18  Temp: 98.3 F (36.8 C)   Filed Weights   08/31/16 0846  Weight: 165 lb 14.4 oz (75.3 kg)    GENERAL:alert, no distress and comfortable SKIN: skin color, texture, turgor are normal, no rashes or significant lesions EYES: normal, Conjunctiva are pink and non-injected, sclera clear OROPHARYNX:no exudate, no erythema and lips, buccal mucosa, and tongue normal  NECK: supple, thyroid normal size, non-tender, without nodularity LYMPH:  no palpable lymphadenopathy in the cervical, axillary or inguinal LUNGS: clear to auscultation and percussion with  normal breathing effort HEART: regular rate & rhythm and no murmurs and no lower extremity edema ABDOMEN:abdomen soft, non-tender and normal bowel sounds Musculoskeletal:no cyanosis of digits and no clubbing  NEURO: alert & oriented x 3 with fluent speech, no focal motor/sensory deficits  LABORATORY DATA:  I have reviewed the data as listed    Component Value Date/Time   NA 137 07/02/2016 0819   K 4.0 07/02/2016 0819   CL 109 04/23/2016 0345   CO2 24 07/02/2016 0819   GLUCOSE 224 (H) 07/02/2016 0819   BUN 28.3 (H) 07/02/2016 0819   CREATININE 2.1 (H) 07/02/2016 0819   CALCIUM 9.4 07/02/2016 0819   PROT 6.8 07/02/2016 0819   ALBUMIN 3.9 07/02/2016 0819   AST 20 07/02/2016 0819   ALT 28 07/02/2016 0819   ALKPHOS 98 07/02/2016 0819   BILITOT 0.69 07/02/2016 0819   GFRNONAA 34 (L) 04/23/2016 0345   GFRNONAA 33 (L) 09/11/2013 0920   GFRAA 40 (L) 04/23/2016 0345   GFRAA 38 (L) 09/11/2013 0920    No results found for: SPEP, UPEP  Lab Results  Component Value Date   WBC 10.8 (H) 08/31/2016   NEUTROABS 8.8 (H) 08/31/2016   HGB 13.6 08/31/2016   HCT 40.2 08/31/2016   MCV 82.5  08/31/2016   PLT 212 08/31/2016      Chemistry      Component Value Date/Time   NA 137 07/02/2016 0819   K 4.0 07/02/2016 0819   CL 109 04/23/2016 0345   CO2 24 07/02/2016 0819   BUN 28.3 (H) 07/02/2016 0819   CREATININE 2.1 (H) 07/02/2016 0819   GLU 382 (H) 01/31/2015 1551      Component Value Date/Time   CALCIUM 9.4 07/02/2016 0819   ALKPHOS 98 07/02/2016 0819   AST 20 07/02/2016 0819   ALT 28 07/02/2016 0819   BILITOT 0.69 07/02/2016 0819       RADIOGRAPHIC STUDIES: I have personally reviewed the radiological images as listed and agreed with the findings in the report. Nm Pet Image Restag (ps) Skull Base To Thigh  Result Date: 08/30/2016 CLINICAL DATA:  Subsequent treatment strategy for diffuse large B-cell lymphoma. Prior left upper lobe wedge resection for adenocarcinoma. EXAM:  NUCLEAR MEDICINE PET SKULL BASE TO THIGH TECHNIQUE: 8.1 mCi F-18 FDG was injected intravenously. Full-ring PET imaging was performed from the skull base to thigh after the radiotracer. CT data was obtained and used for attenuation correction and anatomic localization. FASTING BLOOD GLUCOSE:  Value: 171 mg/dl COMPARISON:  09/09/15.  Chest CT 03/22/2016. FINDINGS: NECK No areas of abnormal hypermetabolism. Bilateral carotid atherosclerosis. No cervical adenopathy. CHEST No thoracic nodal or pulmonary parenchymal hypermetabolism identified. Interval left upper lobe wedge resection. Trace left pleural thickening is likely secondary. Right Port-A-Cath which terminates in the mid to low SVC. Right axillary node dissection. Centrilobular emphysema. ABDOMEN/PELVIS No abdominopelvic parenchymal or nodal hypermetabolism. Abdominal aortic atherosclerosis. Mild prostatomegaly. No abdominopelvic adenopathy. SKELETON hypermetabolism corresponding to healing posteromedial left eighth rib fracture. No other abnormal osseous hypermetabolism. Right greater than left hip osteoarthritis. Degenerative partial fusion of the bilateral sacroiliac joints. Healing fifth lateral left rib fracture. IMPRESSION: 1. No evidence of hypermetabolic residual or recurrent lymphoma. 2. Interval left upper lobe wedge resection for adenocarcinoma. Healing left rib fractures which are likely postsurgical. 3.  Aortic atherosclerosis. Electronically Signed   By: Abigail Miyamoto M.D.   On: 08/30/2016 08:46    ASSESSMENT & PLAN:  Adenocarcinoma of lung, stage 1, left ([place]) I review recent guidelines. He does not need adjuvant treatment. We'll monitor with periodic imaging study PET CT scan from 08/30/2016 show no evidence of disease.  We will repeat another imaging study in 6 months, due around the end of August 2018  Diffuse large B-cell lymphoma of lymph nodes of multiple regions ([place]) Clinically, he has no evidence of disease recurrence. I will  continue rituximab. I will see him back in 2 months Next PET scan due in August 2018  Chronic kidney disease, stage III (moderate) He has chronic kidney disease and this is unrelated to the lymphoma.  Continue close monitoring. he will continue current medical management. I recommend close follow-up with primary care doctor for medication adjustment.  DM (diabetes mellitus), type 2 with renal complications His blood sugar is stable. Continue medical management   No orders of the defined types were placed in this encounter.  All questions were answered. The patient knows to call the clinic with any problems, questions or concerns. No barriers to learning was detected. I spent 15 minutes counseling the patient face to face. The total time spent in the appointment was 20 minutes and more than 50% was on counseling and review of test results     Heath Lark, MD 08/31/2016 8:57 AM

## 2016-09-28 ENCOUNTER — Other Ambulatory Visit: Payer: Medicare Other

## 2016-09-28 ENCOUNTER — Ambulatory Visit: Payer: Medicare Other | Admitting: Hematology and Oncology

## 2016-09-28 ENCOUNTER — Ambulatory Visit: Payer: Medicare Other

## 2016-10-26 ENCOUNTER — Ambulatory Visit (INDEPENDENT_AMBULATORY_CARE_PROVIDER_SITE_OTHER): Payer: Medicare Other | Admitting: Physician Assistant

## 2016-10-26 ENCOUNTER — Encounter: Payer: Self-pay | Admitting: Physician Assistant

## 2016-10-26 VITALS — BP 138/81 | HR 68 | Temp 98.5°F | Resp 16 | Ht 70.0 in | Wt 167.0 lb

## 2016-10-26 DIAGNOSIS — E785 Hyperlipidemia, unspecified: Secondary | ICD-10-CM | POA: Diagnosis not present

## 2016-10-26 DIAGNOSIS — N183 Chronic kidney disease, stage 3 unspecified: Secondary | ICD-10-CM

## 2016-10-26 DIAGNOSIS — E1122 Type 2 diabetes mellitus with diabetic chronic kidney disease: Secondary | ICD-10-CM

## 2016-10-26 DIAGNOSIS — Z1211 Encounter for screening for malignant neoplasm of colon: Secondary | ICD-10-CM

## 2016-10-26 DIAGNOSIS — I10 Essential (primary) hypertension: Secondary | ICD-10-CM

## 2016-10-26 DIAGNOSIS — Z125 Encounter for screening for malignant neoplasm of prostate: Secondary | ICD-10-CM

## 2016-10-26 DIAGNOSIS — J431 Panlobular emphysema: Secondary | ICD-10-CM

## 2016-10-26 DIAGNOSIS — J439 Emphysema, unspecified: Secondary | ICD-10-CM | POA: Insufficient documentation

## 2016-10-26 DIAGNOSIS — I129 Hypertensive chronic kidney disease with stage 1 through stage 4 chronic kidney disease, or unspecified chronic kidney disease: Secondary | ICD-10-CM

## 2016-10-26 MED ORDER — METOPROLOL TARTRATE 25 MG PO TABS: 25.0000 mg | ORAL_TABLET | Freq: Two times a day (BID) | ORAL | 3 refills | Status: DC

## 2016-10-26 MED ORDER — GLIPIZIDE ER 10 MG PO TB24
ORAL_TABLET | ORAL | 3 refills | Status: DC
Start: 2016-10-26 — End: 2017-01-25

## 2016-10-26 MED ORDER — LOSARTAN POTASSIUM 100 MG PO TABS: 100.0000 mg | ORAL_TABLET | Freq: Every day | ORAL | 3 refills | Status: DC

## 2016-10-26 MED ORDER — AMLODIPINE BESYLATE 10 MG PO TABS: ORAL_TABLET | ORAL | 3 refills | Status: DC

## 2016-10-26 MED ORDER — HYDROCHLOROTHIAZIDE 25 MG PO TABS: 25.0000 mg | ORAL_TABLET | Freq: Every day | ORAL | 3 refills | Status: DC

## 2016-10-26 MED ORDER — PRAVASTATIN SODIUM 40 MG PO TABS: ORAL_TABLET | ORAL | 3 refills | Status: DC

## 2016-10-26 NOTE — Assessment & Plan Note (Signed)
Stable renal function at last check.

## 2016-10-26 NOTE — Progress Notes (Signed)
[name], male    DOB: 1950/07/28, 66 y.o.   MRN: [id number]  PCP: Harrison Mons, PA-C  Chief Complaint  Patient presents with  . Follow-up    6 month    Subjective:   Presents for evaluation of diabetes.  Doesn't check home glucose or BP. Needs eye exam and colonoscopy. Saw GI, and decided on Cologuard, but the box never arrived in the mail. He did not follow-up with Dr. Lorie Apley office regarding the test kit. He has an eye specialist, but hasn't scheduled his annual visit.  Feels well. No problems or concerns. Chronic hoarseness due to treatments for adenocarcinoma of the lung. Tolerating medications without adverse effects.  Last lipid panel 10/2015 revealed LDL 71, HDL 38, TG 121, TC 133. Last labs with oncology 08/31/2016. CMET revealed glucose 238, creatinine 2.0.  No CP, SOB, HA, dizziness. No polydipsia, polyuria. No nausea, vomiting or diarrhea.    Review of Systems  Constitutional: Negative.   HENT: Positive for voice change. Negative for congestion, dental problem, postnasal drip, rhinorrhea and sore throat.   Eyes: Negative for visual disturbance.  Respiratory: Negative for cough, chest tightness, shortness of breath and wheezing.   Cardiovascular: Negative for chest pain, palpitations and leg swelling.  Endocrine: Negative for cold intolerance, heat intolerance, polydipsia, polyphagia and polyuria.  Genitourinary: Negative for dysuria, frequency and urgency.  Musculoskeletal: Negative.   Allergic/Immunologic: Positive for immunocompromised state.  Neurological: Negative.   Hematological: Negative.   Psychiatric/Behavioral: Negative.        Patient Active Problem List   Diagnosis Date Noted  . Incisional pain 07/02/2016  . Adenocarcinoma of lung, stage 1, left ([place]) 04/15/2016  . Essential hypertension, benign 03/02/2016  . Port catheter in place 10/31/2015  . Nodule of left lung 09/11/2015  . Restrictive lung disease  04/23/2015  . Reactive thrombocytosis 01/09/2015  . Elevated liver enzymes 01/09/2015  . Leukocytosis 12/19/2014  . Thrombocytopenia due to drugs 12/11/2014  . Diffuse large B-cell lymphoma of lymph nodes of multiple regions ([place]) 11/04/2014  . Anemia in neoplastic disease 11/04/2014  . Hyperlipidemia 05/11/2013  . Hypertensive renal disease   . DM (diabetes mellitus), type 2 with renal complications ([place])   . Chronic kidney disease, stage III (moderate)   . History of tobacco abuse   . Erectile dysfunction      Prior to Admission medications   Medication Sig Start Date End Date Taking? Authorizing Provider  amLODipine (NORVASC) 10 MG tablet TAKE ONE TABLET BY MOUTH ONCE DAILY Patient taking differently: Take 10 mg by mouth daily.  01/27/16   Zeah Germano, PA-C  aspirin 81 MG tablet Take 81 mg by mouth daily.    Historical Provider, MD  fish oil-omega-3 fatty acids 1000 MG capsule Take 1 g by mouth daily.    Historical Provider, MD  glipiZIDE (GLUCOTROL XL) 10 MG 24 hr tablet TAKE ONE TABLET BY MOUTH ONCE DAILY 01/27/16   Sarra Rachels, PA-C  glucose blood (ACCU-CHEK AVIVA PLUS) test strip Use as instructed 09/24/14   Kolina Kube, PA-C  metoprolol tartrate (LOPRESSOR) 25 MG tablet Take 1 tablet (25 mg total) by mouth 2 (two) times daily. 04/26/16   Wayne E Gold, PA-C  pravastatin (PRAVACHOL) 40 MG tablet TAKE ONE TABLET BY MOUTH ONCE DAILY Patient taking differently: Take 40 mg by mouth daily.  10/21/15   Anallely Rosell, PA-C  sitaGLIPtin (JANUVIA) 50 MG tablet Take 1 tablet (50 mg total) by mouth daily. 01/27/16  Harrison Mons, PA-C     Allergies  Allergen Reactions  . Ace Inhibitors Hives  . Rituximab Other (See Comments)    This medication caused him to sweat & shake       Objective:  Physical Exam  Constitutional: He is oriented to person, place, and time. He appears well-developed and well-nourished. He is active and cooperative. No distress.  BP 138/81   Pulse 68    Temp 98.5 F (36.9 C) (Oral)   Resp 16   Ht '5\' 10"'  (1.778 m)   Wt 167 lb (75.8 kg)   SpO2 98%   BMI 23.96 kg/m   HENT:  Head: Normocephalic and atraumatic.  Right Ear: Hearing normal.  Left Ear: Hearing normal.  Eyes: Conjunctivae are normal. No scleral icterus.  Neck: Normal range of motion. Neck supple. No thyromegaly present.  Hoarse voice  Cardiovascular: Normal rate, regular rhythm and normal heart sounds.   Pulses:      Radial pulses are 2+ on the right side, and 2+ on the left side.  Pulmonary/Chest: Effort normal and breath sounds normal.  Lymphadenopathy:       Head (right side): No tonsillar, no preauricular, no posterior auricular and no occipital adenopathy present.       Head (left side): No tonsillar, no preauricular, no posterior auricular and no occipital adenopathy present.    He has no cervical adenopathy.       Right: No supraclavicular adenopathy present.       Left: No supraclavicular adenopathy present.  Neurological: He is alert and oriented to person, place, and time. No sensory deficit.  Skin: Skin is warm, dry and intact. No rash noted. No cyanosis or erythema. Nails show no clubbing.  Psychiatric: He has a normal mood and affect. His speech is normal and behavior is normal.           Assessment & Plan:   Problem List Items Addressed This Visit    DM (diabetes mellitus), type 2 with renal complications ([place]) (Chronic)    Uncontrolled glucose 2 months ago. Await A1C today.      Relevant Medications   glipiZIDE (GLUCOTROL XL) 10 MG 24 hr tablet   losartan (COZAAR) 100 MG tablet   pravastatin (PRAVACHOL) 40 MG tablet   Other Relevant Orders   Hemoglobin A1c   Microalbumin, urine   Chronic kidney disease, stage III (moderate) (Chronic)    Stable renal function at last check.      Hyperlipidemia    Update lipids today. Adjust regimen if needed.      Relevant Medications   amLODipine (NORVASC) 10 MG tablet   hydrochlorothiazide  (HYDRODIURIL) 25 MG tablet   losartan (COZAAR) 100 MG tablet   pravastatin (PRAVACHOL) 40 MG tablet   metoprolol tartrate (LOPRESSOR) 25 MG tablet   Other Relevant Orders   Lipid panel   Essential hypertension, benign - Primary    Reasonably well controlled today.       Relevant Medications   amLODipine (NORVASC) 10 MG tablet   hydrochlorothiazide (HYDRODIURIL) 25 MG tablet   losartan (COZAAR) 100 MG tablet   pravastatin (PRAVACHOL) 40 MG tablet   metoprolol tartrate (LOPRESSOR) 25 MG tablet   COPD (chronic obstructive pulmonary disease) with emphysema (HCC)    Stable. No recent exacerbations.       Other Visit Diagnoses    Hypertensive renal disease, stage 1-4 or unspecified chronic kidney disease  (Chronic)      Screening for prostate cancer  Relevant Orders   PSA   Screening for colon cancer       Relevant Orders   Cologuard       Return in about 3 months (around 01/25/2017) for re-evaluation of diabetes.   Fara Chute, PA-C [facility]

## 2016-10-26 NOTE — Assessment & Plan Note (Signed)
Update lipids today. Adjust regimen if needed.

## 2016-10-26 NOTE — Patient Instructions (Addendum)
Please schedule the eye appointment.    IF you received an x-ray today, you will receive an invoice from [facility]. Please contact [facility] at [phone] with questions or concerns regarding your invoice.   IF you received labwork today, you will receive an invoice from [facility]. Please contact [facility] at [phone] with questions or concerns regarding your invoice.   Our billing staff will not be able to assist you with questions regarding bills from these companies.  You will be contacted with the lab results as soon as they are available. The fastest way to get your results is to activate your My Chart account. Instructions are located on the last page of this paperwork. If you have not heard from [place] regarding the results in 2 weeks, please contact this office.

## 2016-10-26 NOTE — Assessment & Plan Note (Signed)
Uncontrolled glucose 2 months ago. Await A1C today.

## 2016-10-26 NOTE — Assessment & Plan Note (Signed)
Reasonably well controlled today.

## 2016-10-26 NOTE — Assessment & Plan Note (Signed)
Stable. No recent exacerbations.

## 2016-10-27 LAB — LIPID PANEL
CHOLESTEROL TOTAL: 157 mg/dL (ref 100–199)
Chol/HDL Ratio: 4.5 ratio (ref 0.0–5.0)
HDL: 35 mg/dL — AB (ref >39)
LDL Calculated: 93 mg/dL (ref 0–99)
TRIGLYCERIDES: 147 mg/dL (ref 0–149)
VLDL CHOLESTEROL CAL: 29 mg/dL (ref 5–40)

## 2016-10-27 LAB — MICROALBUMIN, URINE

## 2016-10-27 LAB — HEMOGLOBIN A1C
Est. average glucose Bld gHb Est-mCnc: 189 mg/dL
Hgb A1c MFr Bld: 8.2 % — ABNORMAL HIGH (ref 4.8–5.6)

## 2016-10-27 LAB — PSA: Prostate Specific Ag, Serum: 0.8 ng/mL (ref 0.0–4.0)

## 2016-11-01 ENCOUNTER — Telehealth: Payer: Self-pay | Admitting: Hematology and Oncology

## 2016-11-01 ENCOUNTER — Other Ambulatory Visit: Payer: Self-pay | Admitting: Hematology and Oncology

## 2016-11-01 ENCOUNTER — Ambulatory Visit (HOSPITAL_BASED_OUTPATIENT_CLINIC_OR_DEPARTMENT_OTHER): Payer: Medicare Other | Admitting: Hematology and Oncology

## 2016-11-01 ENCOUNTER — Ambulatory Visit: Payer: Medicare Other

## 2016-11-01 ENCOUNTER — Other Ambulatory Visit (HOSPITAL_BASED_OUTPATIENT_CLINIC_OR_DEPARTMENT_OTHER): Payer: Medicare Other

## 2016-11-01 ENCOUNTER — Ambulatory Visit (HOSPITAL_BASED_OUTPATIENT_CLINIC_OR_DEPARTMENT_OTHER): Payer: Medicare Other

## 2016-11-01 VITALS — BP 129/70 | HR 74 | Temp 97.6°F | Resp 18

## 2016-11-01 DIAGNOSIS — C8338 Diffuse large B-cell lymphoma, lymph nodes of multiple sites: Secondary | ICD-10-CM

## 2016-11-01 DIAGNOSIS — C3412 Malignant neoplasm of upper lobe, left bronchus or lung: Secondary | ICD-10-CM

## 2016-11-01 DIAGNOSIS — Z5112 Encounter for antineoplastic immunotherapy: Secondary | ICD-10-CM

## 2016-11-01 DIAGNOSIS — Z95828 Presence of other vascular implants and grafts: Secondary | ICD-10-CM

## 2016-11-01 DIAGNOSIS — N183 Chronic kidney disease, stage 3 unspecified: Secondary | ICD-10-CM

## 2016-11-01 DIAGNOSIS — C3492 Malignant neoplasm of unspecified part of left bronchus or lung: Secondary | ICD-10-CM

## 2016-11-01 LAB — COMPREHENSIVE METABOLIC PANEL
ALBUMIN: 4 g/dL (ref 3.5–5.0)
ALK PHOS: 85 U/L (ref 40–150)
ALT: 25 U/L (ref 0–55)
ANION GAP: 10 meq/L (ref 3–11)
AST: 23 U/L (ref 5–34)
BILIRUBIN TOTAL: 0.72 mg/dL (ref 0.20–1.20)
BUN: 29 mg/dL — ABNORMAL HIGH (ref 7.0–26.0)
CO2: 24 meq/L (ref 22–29)
Calcium: 9.2 mg/dL (ref 8.4–10.4)
Chloride: 101 mEq/L (ref 98–109)
Creatinine: 2.4 mg/dL — ABNORMAL HIGH (ref 0.7–1.3)
EGFR: 31 mL/min/{1.73_m2} — AB (ref >90)
Glucose: 304 mg/dl — ABNORMAL HIGH (ref 70–140)
POTASSIUM: 3.8 meq/L (ref 3.5–5.1)
SODIUM: 135 meq/L — AB (ref 136–145)
TOTAL PROTEIN: 6.7 g/dL (ref 6.4–8.3)

## 2016-11-01 LAB — CBC WITH DIFFERENTIAL/PLATELET
BASO%: 0.2 % (ref 0.0–2.0)
Basophils Absolute: 0 10*3/uL (ref 0.0–0.1)
EOS%: 1 % (ref 0.0–7.0)
Eosinophils Absolute: 0.1 10*3/uL (ref 0.0–0.5)
HEMATOCRIT: 38.1 % — AB (ref 38.4–49.9)
HEMOGLOBIN: 13 g/dL (ref 13.0–17.1)
LYMPH#: 1.1 10*3/uL (ref 0.9–3.3)
LYMPH%: 10.8 % — ABNORMAL LOW (ref 14.0–49.0)
MCH: 28.3 pg (ref 27.2–33.4)
MCHC: 34.1 g/dL (ref 32.0–36.0)
MCV: 82.8 fL (ref 79.3–98.0)
MONO#: 0.7 10*3/uL (ref 0.1–0.9)
MONO%: 6.6 % (ref 0.0–14.0)
NEUT#: 8.6 10*3/uL — ABNORMAL HIGH (ref 1.5–6.5)
NEUT%: 81.4 % — ABNORMAL HIGH (ref 39.0–75.0)
PLATELETS: 201 10*3/uL (ref 140–400)
RBC: 4.6 10*6/uL (ref 4.20–5.82)
RDW: 13.5 % (ref 11.0–14.6)
WBC: 10.5 10*3/uL — ABNORMAL HIGH (ref 4.0–10.3)

## 2016-11-01 MED ORDER — DIPHENHYDRAMINE HCL 25 MG PO CAPS
ORAL_CAPSULE | ORAL | Status: AC
  Filled 2016-11-01: qty 2

## 2016-11-01 MED ORDER — DIPHENHYDRAMINE HCL 25 MG PO CAPS
50.0000 mg | ORAL_CAPSULE | Freq: Once | ORAL | Status: AC
  Administered 2016-11-01: 50 mg via ORAL

## 2016-11-01 MED ORDER — ACETAMINOPHEN 325 MG PO TABS
650.0000 mg | ORAL_TABLET | Freq: Once | ORAL | Status: AC
  Administered 2016-11-01: 650 mg via ORAL

## 2016-11-01 MED ORDER — SODIUM CHLORIDE 0.9 % IV SOLN
375.0000 mg/m2 | Freq: Once | INTRAVENOUS | Status: AC
  Administered 2016-11-01: 700 mg via INTRAVENOUS
  Filled 2016-11-01: qty 50

## 2016-11-01 MED ORDER — SODIUM CHLORIDE 0.9 % IV SOLN
Freq: Once | INTRAVENOUS | Status: AC
  Administered 2016-11-01: 10:00:00 via INTRAVENOUS

## 2016-11-01 MED ORDER — SODIUM CHLORIDE 0.9 % IJ SOLN
10.0000 mL | INTRAMUSCULAR | Status: DC | PRN
  Administered 2016-11-01: 10 mL
  Filled 2016-11-01: qty 10

## 2016-11-01 MED ORDER — SODIUM CHLORIDE 0.9 % IJ SOLN
10.0000 mL | INTRAMUSCULAR | Status: DC | PRN
  Administered 2016-11-01: 10 mL via INTRAVENOUS
  Filled 2016-11-01: qty 10

## 2016-11-01 MED ORDER — HEPARIN SOD (PORK) LOCK FLUSH 100 UNIT/ML IV SOLN
500.0000 [IU] | Freq: Once | INTRAVENOUS | Status: AC | PRN
  Administered 2016-11-01: 500 [IU]
  Filled 2016-11-01: qty 5

## 2016-11-01 MED ORDER — ACETAMINOPHEN 325 MG PO TABS
ORAL_TABLET | ORAL | Status: AC
  Filled 2016-11-01: qty 2

## 2016-11-01 NOTE — Patient Instructions (Signed)
Implanted Port Home Guide An implanted port is a type of central line that is placed under the skin. Central lines are used to provide IV access when treatment or nutrition needs to be given through a person's veins. Implanted ports are used for long-term IV access. An implanted port may be placed because:  You need IV medicine that would be irritating to the small veins in your hands or arms.  You need long-term IV medicines, such as antibiotics.  You need IV nutrition for a long period.  You need frequent blood draws for lab tests.  You need dialysis.  Implanted ports are usually placed in the chest area, but they can also be placed in the upper arm, the abdomen, or the leg. An implanted port has two main parts:  Reservoir. The reservoir is round and will appear as a small, raised area under your skin. The reservoir is the part where a needle is inserted to give medicines or draw blood.  Catheter. The catheter is a thin, flexible tube that extends from the reservoir. The catheter is placed into a large vein. Medicine that is inserted into the reservoir goes into the catheter and then into the vein.  How will I care for my incision site? Do not get the incision site wet. Bathe or shower as directed by your health care provider. How is my port accessed? Special steps must be taken to access the port:  Before the port is accessed, a numbing cream can be placed on the skin. This helps numb the skin over the port site.  Your health care provider uses a sterile technique to access the port. ? Your health care provider must put on a mask and sterile gloves. ? The skin over your port is cleaned carefully with an antiseptic and allowed to dry. ? The port is gently pinched between sterile gloves, and a needle is inserted into the port.  Only "non-coring" port needles should be used to access the port. Once the port is accessed, a blood return should be checked. This helps ensure that the port  is in the vein and is not clogged.  If your port needs to remain accessed for a constant infusion, a clear (transparent) bandage will be placed over the needle site. The bandage and needle will need to be changed every week, or as directed by your health care provider.  Keep the bandage covering the needle clean and dry. Do not get it wet. Follow your health care provider's instructions on how to take a shower or bath while the port is accessed.  If your port does not need to stay accessed, no bandage is needed over the port.  What is flushing? Flushing helps keep the port from getting clogged. Follow your health care provider's instructions on how and when to flush the port. Ports are usually flushed with saline solution or a medicine called heparin. The need for flushing will depend on how the port is used.  If the port is used for intermittent medicines or blood draws, the port will need to be flushed: ? After medicines have been given. ? After blood has been drawn. ? As part of routine maintenance.  If a constant infusion is running, the port may not need to be flushed.  How long will my port stay implanted? The port can stay in for as long as your health care provider thinks it is needed. When it is time for the port to come out, surgery will be   done to remove it. The procedure is similar to the one performed when the port was put in. When should I seek immediate medical care? When you have an implanted port, you should seek immediate medical care if:  You notice a bad smell coming from the incision site.  You have swelling, redness, or drainage at the incision site.  You have more swelling or pain at the port site or the surrounding area.  You have a fever that is not controlled with medicine.  This information is not intended to replace advice given to you by your health care provider. Make sure you discuss any questions you have with your health care provider. Document  Released: 06/21/2005 Document Revised: 11/27/2015 Document Reviewed: 02/26/2013 Elsevier Interactive Patient Education  2017 [employer]vier Inc.  

## 2016-11-01 NOTE — Telephone Encounter (Signed)
Gave patient AVS and calender per 4/30 los.

## 2016-11-01 NOTE — Patient Instructions (Signed)
[facility] Discharge Instructions for Patients Receiving Chemotherapy  Today you received the following chemotherapy agents: Rituxan   To help prevent nausea and vomiting after your treatment, we encourage you to take your nausea medication as directed.    If you develop nausea and vomiting that is not controlled by your nausea medication, call the clinic.   BELOW ARE SYMPTOMS THAT SHOULD BE REPORTED IMMEDIATELY:  *FEVER GREATER THAN 100.5 F  *CHILLS WITH OR WITHOUT FEVER  NAUSEA AND VOMITING THAT IS NOT CONTROLLED WITH YOUR NAUSEA MEDICATION  *UNUSUAL SHORTNESS OF BREATH  *UNUSUAL BRUISING OR BLEEDING  TENDERNESS IN MOUTH AND THROAT WITH OR WITHOUT PRESENCE OF ULCERS  *URINARY PROBLEMS  *BOWEL PROBLEMS  UNUSUAL RASH Items with * indicate a potential emergency and should be followed up as soon as possible.  Feel free to call the clinic you have any questions or concerns. The clinic phone number is [phone].  Please show the CHEMO ALERT CARD at check-in to the Emergency Department and triage nurse.   

## 2016-11-02 ENCOUNTER — Encounter: Payer: Self-pay | Admitting: Hematology and Oncology

## 2016-11-02 NOTE — Assessment & Plan Note (Signed)
I review recent guidelines. He does not need adjuvant treatment. We'll monitor with periodic imaging study PET CT scan from 08/30/2016 show no evidence of disease.  We will repeat another imaging study in 6 months, due around the end of August 2018 

## 2016-11-02 NOTE — Progress Notes (Signed)
[facility] OFFICE PROGRESS NOTE  Patient Care Team: Harrison Mons, PA-C as PCP - General (Physician Assistant) Corliss Parish, MD as Consulting Physician (Nephrology) Fanny Skates, MD as Consulting Physician (General Surgery) Heath Lark, MD as Consulting Physician (Hematology and Oncology) Javier Glazier, MD as Consulting Physician (Pulmonary Disease) Juanita Craver, MD as Consulting Physician (Gastroenterology)  SUMMARY OF ONCOLOGIC HISTORY: Oncology History   Lymphoma   Staging form: Lymphoid Neoplasms, AJCC 6th Edition     Clinical stage from 11/21/2014: Stage IV - Signed by Heath Lark, MD on 11/21/2014       Diffuse large B-cell lymphoma of lymph nodes of multiple regions ([place])   11/13/2014 Imaging    PEt scan showed widespread disease including possible splenic involvement      11/19/2014 Surgery    He has excisional LN biopsy and port placement      11/19/2014 Pathology Results    Biopsy of lymph nodes showed DLBCL      11/29/2014 - 01/31/2015 Chemotherapy    He received 4 cycles of RCHOP chemotherapy complicated by severe infusion reaction.      12/19/2014 - 01/09/2015 Chemotherapy    He received 2 cycles of prophylactic IT chemotherapy.      01/31/2015 Adverse Reaction    I reduce the dose of his chemotherapy, prednisone and dexamethasone due to poorly controlled diabetes and hyperglycemia      02/12/2015 Imaging     repeat PET CT scan show complete response to chemotherapy but new lung infiltrates      09/09/2015 Imaging    PET CT showed complete response except for new inflammatory change in the lung      03/01/2016 Imaging    CT chest showed mild interval growth of subsolid left upper lobe pulmonary nodule, which was hypermetabolic on 34/19/3790 PET-CT. A primary bronchogenic adenocarcinoma cannot be excluded. Thoracic surgical consultation is advised. No lymphadenopathy or other findings of metastatic disease in the chest. Moderate emphysema  with mild diffuse bronchial wall thickening, suggesting COPD. Aortic atherosclerosis. Stable small pericardial effusion/thickening.      08/30/2016 PET scan    No evidence of hypermetabolic residual or recurrent lymphoma. 2. Interval left upper lobe wedge resection for adenocarcinoma. Healing left rib fractures which are likely postsurgical. 3. Aortic atherosclerosis.       Adenocarcinoma of lung, stage 1, left ([place])   03/24/2016 Procedure    He underwent electromagnetic navigational bronchoscopy with needle aspirations, brushings, and biopsies.      03/24/2016 Pathology Results    Accession: [id number] biopsy was positive for adenocarcinoma      04/15/2016 Initial Diagnosis    Adenocarcinoma of lung, stage 1, left ([place])     04/15/2016 Surgery    He underwent left video-assisted thoracoscopy, thoracoscopic left upper lobectomy, mediastinal lymph node dissection, on-Q local anesthetic catheter placement      04/15/2016 Pathology Results    Accession: [id number] surgical specimen revealed adenocarcinoma, 2.2 cm with negative margin and negative LN involvement       INTERVAL HISTORY: Please see below for problem oriented charting. He returns for further follow-up and treatment. He feels well. No recent infection No new lymphadenopathy. Denies recent chest pain or shortness of breath He has chronic hoarseness since his lung surgery, stable.  REVIEW OF SYSTEMS:   Constitutional: Denies fevers, chills or abnormal weight loss Eyes: Denies blurriness of vision Ears, nose, mouth, throat, and face: Denies mucositis or sore throat Respiratory: Denies cough, dyspnea or wheezes Cardiovascular: Denies palpitation, chest  discomfort or lower extremity swelling Gastrointestinal:  Denies nausea, heartburn or change in bowel habits Skin: Denies abnormal skin rashes Lymphatics: Denies new lymphadenopathy or easy bruising Neurological:Denies numbness, tingling or new  weaknesses Behavioral/Psych: Mood is stable, no new changes  All other systems were reviewed with the patient and are negative.  I have reviewed the past medical history, past surgical history, social history and family history with the patient and they are unchanged from previous note.  ALLERGIES:  is allergic to ace inhibitors and rituximab.  MEDICATIONS:  Current Outpatient Prescriptions  Medication Sig Dispense Refill  . amLODipine (NORVASC) 10 MG tablet TAKE ONE TABLET BY MOUTH ONCE DAILY 90 tablet 3  . aspirin 81 MG tablet Take 81 mg by mouth daily.    . fish oil-omega-3 fatty acids 1000 MG capsule Take 1 g by mouth daily.    Marland Kitchen glipiZIDE (GLUCOTROL XL) 10 MG 24 hr tablet TAKE ONE TABLET BY MOUTH ONCE DAILY 90 tablet 3  . glucose blood (ACCU-CHEK AVIVA PLUS) test strip Use as instructed 100 each 4  . hydrochlorothiazide (HYDRODIURIL) 25 MG tablet Take 1 tablet (25 mg total) by mouth daily. 90 tablet 3  . losartan (COZAAR) 100 MG tablet Take 1 tablet (100 mg total) by mouth daily. 90 tablet 3  . pravastatin (PRAVACHOL) 40 MG tablet TAKE ONE TABLET BY MOUTH ONCE DAILY 90 tablet 3  . sitaGLIPtin (JANUVIA) 50 MG tablet Take 1 tablet (50 mg total) by mouth daily. 30 tablet 12  . metoprolol tartrate (LOPRESSOR) 25 MG tablet Take 1 tablet (25 mg total) by mouth 2 (two) times daily. (Patient not taking: Reported on 11/01/2016) 180 tablet 3   No current facility-administered medications for this visit.     PHYSICAL EXAMINATION: ECOG PERFORMANCE STATUS: 0 - Asymptomatic  Vitals:   11/01/16 0857  BP: (!) 147/62  Pulse: 88  Resp: 17  Temp: 98 F (36.7 C)   Filed Weights   11/01/16 0857  Weight: 167 lb (75.8 kg)    GENERAL:alert, no distress and comfortable SKIN: skin color, texture, turgor are normal, no rashes or significant lesions EYES: normal, Conjunctiva are pink and non-injected, sclera clear OROPHARYNX:no exudate, no erythema and lips, buccal mucosa, and tongue normal   NECK: supple, thyroid normal size, non-tender, without nodularity LYMPH:  no palpable lymphadenopathy in the cervical, axillary or inguinal LUNGS: clear to auscultation and percussion with normal breathing effort HEART: regular rate & rhythm and no murmurs and no lower extremity edema ABDOMEN:abdomen soft, non-tender and normal bowel sounds Musculoskeletal:no cyanosis of digits and no clubbing  NEURO: alert & oriented x 3 with fluent speech, no focal motor/sensory deficits  LABORATORY DATA:  I have reviewed the data as listed    Component Value Date/Time   NA 135 (L) 11/01/2016 0826   K 3.8 11/01/2016 0826   CL 109 04/23/2016 0345   CO2 24 11/01/2016 0826   GLUCOSE 304 (H) 11/01/2016 0826   BUN 29.0 (H) 11/01/2016 0826   CREATININE 2.4 (H) 11/01/2016 0826   CALCIUM 9.2 11/01/2016 0826   PROT 6.7 11/01/2016 0826   ALBUMIN 4.0 11/01/2016 0826   AST 23 11/01/2016 0826   ALT 25 11/01/2016 0826   ALKPHOS 85 11/01/2016 0826   BILITOT 0.72 11/01/2016 0826   GFRNONAA 34 (L) 04/23/2016 0345   GFRNONAA 33 (L) 09/11/2013 0920   GFRAA 40 (L) 04/23/2016 0345   GFRAA 38 (L) 09/11/2013 0920    No results found for: SPEP, UPEP  Lab Results  Component Value Date   WBC 10.5 (H) 11/01/2016   NEUTROABS 8.6 (H) 11/01/2016   HGB 13.0 11/01/2016   HCT 38.1 (L) 11/01/2016   MCV 82.8 11/01/2016   PLT 201 11/01/2016      Chemistry      Component Value Date/Time   NA 135 (L) 11/01/2016 0826   K 3.8 11/01/2016 0826   CL 109 04/23/2016 0345   CO2 24 11/01/2016 0826   BUN 29.0 (H) 11/01/2016 0826   CREATININE 2.4 (H) 11/01/2016 0826   GLU 382 (H) 01/31/2015 1551      Component Value Date/Time   CALCIUM 9.2 11/01/2016 0826   ALKPHOS 85 11/01/2016 0826   AST 23 11/01/2016 0826   ALT 25 11/01/2016 0826   BILITOT 0.72 11/01/2016 0826       ASSESSMENT & PLAN:  Diffuse large B-cell lymphoma of lymph nodes of multiple regions (HCC) Clinically, he has no evidence of disease  recurrence. I will continue rituximab. I will see him back in 2 months Next PET scan due in August 2018  Adenocarcinoma of lung, stage 1, left ([place]) I review recent guidelines. He does not need adjuvant treatment. We'll monitor with periodic imaging study PET CT scan from 08/30/2016 show no evidence of disease.  We will repeat another imaging study in 6 months, due around the end of August 2018  Chronic kidney disease, stage III (moderate) He has chronic kidney disease and this is unrelated to the lymphoma.  Continue close monitoring. he will continue current medical management. I recommend close follow-up with primary care doctor for medication adjustment.   No orders of the defined types were placed in this encounter.  All questions were answered. The patient knows to call the clinic with any problems, questions or concerns. No barriers to learning was detected. I spent 15 minutes counseling the patient face to face. The total time spent in the appointment was 20 minutes and more than 50% was on counseling and review of test results     Heath Lark, MD 11/02/2016 6:39 PM

## 2016-11-02 NOTE — Assessment & Plan Note (Signed)
He has chronic kidney disease and this is unrelated to the lymphoma.  Continue close monitoring. he will continue current medical management. I recommend close follow-up with primary care doctor for medication adjustment. 

## 2016-11-02 NOTE — Assessment & Plan Note (Signed)
Clinically, he has no evidence of disease recurrence. I will continue rituximab. I will see him back in 2 months Next PET scan due in August 2018

## 2016-11-05 ENCOUNTER — Other Ambulatory Visit: Payer: Self-pay | Admitting: Physician Assistant

## 2016-11-05 DIAGNOSIS — E1122 Type 2 diabetes mellitus with diabetic chronic kidney disease: Secondary | ICD-10-CM

## 2016-11-05 MED ORDER — INSULIN GLARGINE 100 UNIT/ML SOLOSTAR PEN: 10.0000 [IU] | PEN_INJECTOR | Freq: Every day | SUBCUTANEOUS | 11 refills | Status: DC

## 2016-11-08 ENCOUNTER — Telehealth: Payer: Self-pay | Admitting: Physician Assistant

## 2016-11-08 MED ORDER — ACCU-CHEK SOFT TOUCH LANCETS MISC: 2 refills | Status: DC

## 2016-11-08 NOTE — Telephone Encounter (Signed)
Pt came in stated that he needs accu-chek guide needles (lancets) for  His meter..  Please advise: [phone]

## 2016-11-09 LAB — COLOGUARD: Cologuard: NEGATIVE

## 2016-11-17 ENCOUNTER — Encounter: Payer: Self-pay | Admitting: Physician Assistant

## 2016-11-30 ENCOUNTER — Telehealth: Payer: Self-pay | Admitting: Physician Assistant

## 2016-11-30 MED ORDER — INSULIN PEN NEEDLE 32G X 4 MM MISC: 3 refills | Status: DC

## 2016-11-30 MED ORDER — GLUCOSE BLOOD VI STRP: ORAL_STRIP | 3 refills | Status: DC

## 2016-11-30 NOTE — Telephone Encounter (Signed)
Meds ordered this encounter  Medications  . glucose blood (GLUCOSE METER TEST) test strip    Sig: Use as instructed    Dispense:  100 each    Refill:  3    Accu-Check Guide    Order Specific Question:   Supervising Provider    Answer:   SHAW, EVA N [[id number]]  . Insulin Pen Needle 32G X 4 MM MISC    Sig: Use to check home glucose as directed    Dispense:  100 each    Refill:  3    ReliOn    Order Specific Question:   Supervising Provider    Answer:   Brigitte Pulse, EVA N [[id number]]

## 2016-12-30 ENCOUNTER — Telehealth: Payer: Self-pay | Admitting: Hematology and Oncology

## 2016-12-30 ENCOUNTER — Ambulatory Visit (HOSPITAL_BASED_OUTPATIENT_CLINIC_OR_DEPARTMENT_OTHER): Payer: Medicare Other | Admitting: Hematology and Oncology

## 2016-12-30 ENCOUNTER — Ambulatory Visit (HOSPITAL_BASED_OUTPATIENT_CLINIC_OR_DEPARTMENT_OTHER): Payer: Medicare Other

## 2016-12-30 ENCOUNTER — Ambulatory Visit: Payer: Medicare Other

## 2016-12-30 ENCOUNTER — Other Ambulatory Visit (HOSPITAL_BASED_OUTPATIENT_CLINIC_OR_DEPARTMENT_OTHER): Payer: Medicare Other

## 2016-12-30 VITALS — BP 124/58 | HR 67 | Temp 98.0°F | Resp 18

## 2016-12-30 DIAGNOSIS — N183 Chronic kidney disease, stage 3 unspecified: Secondary | ICD-10-CM

## 2016-12-30 DIAGNOSIS — C8338 Diffuse large B-cell lymphoma, lymph nodes of multiple sites: Secondary | ICD-10-CM

## 2016-12-30 DIAGNOSIS — C3492 Malignant neoplasm of unspecified part of left bronchus or lung: Secondary | ICD-10-CM

## 2016-12-30 DIAGNOSIS — Z5112 Encounter for antineoplastic immunotherapy: Secondary | ICD-10-CM

## 2016-12-30 DIAGNOSIS — Z95828 Presence of other vascular implants and grafts: Secondary | ICD-10-CM

## 2016-12-30 DIAGNOSIS — C3412 Malignant neoplasm of upper lobe, left bronchus or lung: Secondary | ICD-10-CM

## 2016-12-30 LAB — CBC WITH DIFFERENTIAL/PLATELET
BASO%: 0.6 % (ref 0.0–2.0)
BASOS ABS: 0.1 10*3/uL (ref 0.0–0.1)
EOS ABS: 0.1 10*3/uL (ref 0.0–0.5)
EOS%: 1.1 % (ref 0.0–7.0)
HCT: 41.4 % (ref 38.4–49.9)
HEMOGLOBIN: 13.9 g/dL (ref 13.0–17.1)
LYMPH%: 10.3 % — AB (ref 14.0–49.0)
MCH: 28.4 pg (ref 27.2–33.4)
MCHC: 33.5 g/dL (ref 32.0–36.0)
MCV: 84.5 fL (ref 79.3–98.0)
MONO#: 0.6 10*3/uL (ref 0.1–0.9)
MONO%: 5.8 % (ref 0.0–14.0)
NEUT#: 9.1 10*3/uL — ABNORMAL HIGH (ref 1.5–6.5)
NEUT%: 82.2 % — AB (ref 39.0–75.0)
Platelets: 219 10*3/uL (ref 140–400)
RBC: 4.89 10*6/uL (ref 4.20–5.82)
RDW: 13.7 % (ref 11.0–14.6)
WBC: 11.1 10*3/uL — ABNORMAL HIGH (ref 4.0–10.3)
lymph#: 1.1 10*3/uL (ref 0.9–3.3)

## 2016-12-30 LAB — COMPREHENSIVE METABOLIC PANEL
ALT: 35 U/L (ref 0–55)
ANION GAP: 8 meq/L (ref 3–11)
AST: 36 U/L — AB (ref 5–34)
Albumin: 4 g/dL (ref 3.5–5.0)
Alkaline Phosphatase: 74 U/L (ref 40–150)
BILIRUBIN TOTAL: 0.5 mg/dL (ref 0.20–1.20)
BUN: 27 mg/dL — AB (ref 7.0–26.0)
CHLORIDE: 106 meq/L (ref 98–109)
CO2: 26 meq/L (ref 22–29)
CREATININE: 2 mg/dL — AB (ref 0.7–1.3)
Calcium: 9.8 mg/dL (ref 8.4–10.4)
EGFR: 39 mL/min/{1.73_m2} — ABNORMAL LOW (ref >90)
GLUCOSE: 139 mg/dL (ref 70–140)
Potassium: 4 mEq/L (ref 3.5–5.1)
Sodium: 140 mEq/L (ref 136–145)
TOTAL PROTEIN: 6.8 g/dL (ref 6.4–8.3)

## 2016-12-30 MED ORDER — HEPARIN SOD (PORK) LOCK FLUSH 100 UNIT/ML IV SOLN
500.0000 [IU] | Freq: Once | INTRAVENOUS | Status: AC | PRN
  Administered 2016-12-30: 500 [IU]
  Filled 2016-12-30: qty 5

## 2016-12-30 MED ORDER — DIPHENHYDRAMINE HCL 25 MG PO CAPS
50.0000 mg | ORAL_CAPSULE | Freq: Once | ORAL | Status: AC
  Administered 2016-12-30: 50 mg via ORAL

## 2016-12-30 MED ORDER — SODIUM CHLORIDE 0.9 % IV SOLN
Freq: Once | INTRAVENOUS | Status: AC
  Administered 2016-12-30: 10:00:00 via INTRAVENOUS

## 2016-12-30 MED ORDER — SODIUM CHLORIDE 0.9 % IJ SOLN
10.0000 mL | INTRAMUSCULAR | Status: DC | PRN
  Administered 2016-12-30: 10 mL
  Filled 2016-12-30: qty 10

## 2016-12-30 MED ORDER — ACETAMINOPHEN 325 MG PO TABS
ORAL_TABLET | ORAL | Status: AC
  Filled 2016-12-30: qty 2

## 2016-12-30 MED ORDER — DIPHENHYDRAMINE HCL 25 MG PO CAPS
ORAL_CAPSULE | ORAL | Status: AC
  Filled 2016-12-30: qty 2

## 2016-12-30 MED ORDER — ACETAMINOPHEN 325 MG PO TABS
650.0000 mg | ORAL_TABLET | Freq: Once | ORAL | Status: AC
  Administered 2016-12-30: 650 mg via ORAL

## 2016-12-30 MED ORDER — SODIUM CHLORIDE 0.9 % IJ SOLN
10.0000 mL | INTRAMUSCULAR | Status: DC | PRN
  Administered 2016-12-30: 10 mL via INTRAVENOUS
  Filled 2016-12-30: qty 10

## 2016-12-30 MED ORDER — SODIUM CHLORIDE 0.9 % IV SOLN
375.0000 mg/m2 | Freq: Once | INTRAVENOUS | Status: AC
  Administered 2016-12-30: 700 mg via INTRAVENOUS
  Filled 2016-12-30: qty 50

## 2016-12-30 NOTE — Patient Instructions (Signed)
[facility] Discharge Instructions for Patients Receiving Chemotherapy  Today you received the following chemotherapy agents: Rituxan   To help prevent nausea and vomiting after your treatment, we encourage you to take your nausea medication as directed.    If you develop nausea and vomiting that is not controlled by your nausea medication, call the clinic.   BELOW ARE SYMPTOMS THAT SHOULD BE REPORTED IMMEDIATELY:  *FEVER GREATER THAN 100.5 F  *CHILLS WITH OR WITHOUT FEVER  NAUSEA AND VOMITING THAT IS NOT CONTROLLED WITH YOUR NAUSEA MEDICATION  *UNUSUAL SHORTNESS OF BREATH  *UNUSUAL BRUISING OR BLEEDING  TENDERNESS IN MOUTH AND THROAT WITH OR WITHOUT PRESENCE OF ULCERS  *URINARY PROBLEMS  *BOWEL PROBLEMS  UNUSUAL RASH Items with * indicate a potential emergency and should be followed up as soon as possible.  Feel free to call the clinic you have any questions or concerns. The clinic phone number is [phone].  Please show the CHEMO ALERT CARD at check-in to the Emergency Department and triage nurse.   

## 2016-12-30 NOTE — Patient Instructions (Signed)

## 2016-12-30 NOTE — Progress Notes (Signed)
Per Dr Alvy Bimler ok to tx with today's labs.

## 2016-12-30 NOTE — Telephone Encounter (Signed)
Scheduled appt per 6/28 los - Patient called back to the treatment area before schedule was finished - patient aware to ask Rn in treatment area to print out new schedule.

## 2016-12-31 ENCOUNTER — Encounter: Payer: Self-pay | Admitting: Hematology and Oncology

## 2016-12-31 NOTE — Assessment & Plan Note (Signed)
Clinically, he has no evidence of disease recurrence. I will continue rituximab. I will see him back in 2 months

## 2016-12-31 NOTE — Assessment & Plan Note (Signed)
He has chronic kidney disease and this is unrelated to the lymphoma.  Continue close monitoring. he will continue current medical management. I recommend close follow-up with primary care doctor for medication adjustment.

## 2016-12-31 NOTE — Progress Notes (Signed)
[facility] OFFICE PROGRESS NOTE  Patient Care Team: Harrison Mons, PA-C as PCP - General (Physician Assistant) Corliss Parish, MD as Consulting Physician (Nephrology) Fanny Skates, MD as Consulting Physician (General Surgery) Heath Lark, MD as Consulting Physician (Hematology and Oncology) Javier Glazier, MD as Consulting Physician (Pulmonary Disease) Juanita Craver, MD as Consulting Physician (Gastroenterology)  SUMMARY OF ONCOLOGIC HISTORY: Oncology History   Lymphoma   Staging form: Lymphoid Neoplasms, AJCC 6th Edition     Clinical stage from 11/21/2014: Stage IV - Signed by Heath Lark, MD on 11/21/2014       Diffuse large B-cell lymphoma of lymph nodes of multiple regions ([place])   11/13/2014 Imaging    PEt scan showed widespread disease including possible splenic involvement      11/19/2014 Surgery    He has excisional LN biopsy and port placement      11/19/2014 Pathology Results    Biopsy of lymph nodes showed DLBCL      11/29/2014 - 01/31/2015 Chemotherapy    He received 4 cycles of RCHOP chemotherapy complicated by severe infusion reaction.      12/19/2014 - 01/09/2015 Chemotherapy    He received 2 cycles of prophylactic IT chemotherapy.      01/31/2015 Adverse Reaction    I reduce the dose of his chemotherapy, prednisone and dexamethasone due to poorly controlled diabetes and hyperglycemia      02/12/2015 Imaging     repeat PET CT scan show complete response to chemotherapy but new lung infiltrates      09/09/2015 Imaging    PET CT showed complete response except for new inflammatory change in the lung      03/01/2016 Imaging    CT chest showed mild interval growth of subsolid left upper lobe pulmonary nodule, which was hypermetabolic on 83/15/1761 PET-CT. A primary bronchogenic adenocarcinoma cannot be excluded. Thoracic surgical consultation is advised. No lymphadenopathy or other findings of metastatic disease in the chest. Moderate  emphysema with mild diffuse bronchial wall thickening, suggesting COPD. Aortic atherosclerosis. Stable small pericardial effusion/thickening.      08/30/2016 PET scan    No evidence of hypermetabolic residual or recurrent lymphoma. 2. Interval left upper lobe wedge resection for adenocarcinoma. Healing left rib fractures which are likely postsurgical. 3. Aortic atherosclerosis.       Adenocarcinoma of lung, stage 1, left ([place])   03/24/2016 Procedure    He underwent electromagnetic navigational bronchoscopy with needle aspirations, brushings, and biopsies.      03/24/2016 Pathology Results    Accession: [id number] biopsy was positive for adenocarcinoma      04/15/2016 Initial Diagnosis    Adenocarcinoma of lung, stage 1, left ([place])     04/15/2016 Surgery    He underwent left video-assisted thoracoscopy, thoracoscopic left upper lobectomy, mediastinal lymph node dissection, on-Q local anesthetic catheter placement      04/15/2016 Pathology Results    Accession: [id number] surgical specimen revealed adenocarcinoma, 2.2 cm with negative margin and negative LN involvement       INTERVAL HISTORY: Please see below for problem oriented charting. Returns for cycle 11 of chemotherapy He denies recent cough, chest pain or lung infection. No new lymphadenopathy.  Appetite is stable, denies recent weight loss.  REVIEW OF SYSTEMS:   Constitutional: Denies fevers, chills or abnormal weight loss Eyes: Denies blurriness of vision Ears, nose, mouth, throat, and face: Denies mucositis or sore throat Respiratory: Denies cough, dyspnea or wheezes Cardiovascular: Denies palpitation, chest discomfort or lower extremity swelling Gastrointestinal:  Denies nausea, heartburn or change in bowel habits Skin: Denies abnormal skin rashes Lymphatics: Denies new lymphadenopathy or easy bruising Neurological:Denies numbness, tingling or new weaknesses Behavioral/Psych: Mood is stable, no new changes   All other systems were reviewed with the patient and are negative.  I have reviewed the past medical history, past surgical history, social history and family history with the patient and they are unchanged from previous note.  ALLERGIES:  is allergic to ace inhibitors and rituximab.  MEDICATIONS:  Current Outpatient Prescriptions  Medication Sig Dispense Refill  . amLODipine (NORVASC) 10 MG tablet TAKE ONE TABLET BY MOUTH ONCE DAILY 90 tablet 3  . aspirin 81 MG tablet Take 81 mg by mouth daily.    . fish oil-omega-3 fatty acids 1000 MG capsule Take 1 g by mouth daily.    Marland Kitchen glipiZIDE (GLUCOTROL XL) 10 MG 24 hr tablet TAKE ONE TABLET BY MOUTH ONCE DAILY 90 tablet 3  . glucose blood (GLUCOSE METER TEST) test strip Use as instructed 100 each 3  . hydrochlorothiazide (HYDRODIURIL) 25 MG tablet Take 1 tablet (25 mg total) by mouth daily. 90 tablet 3  . Insulin Glargine (LANTUS) 100 UNIT/ML Solostar Pen Inject 10 Units into the skin daily at 10 pm. 15 mL 11  . Insulin Pen Needle 32G X 4 MM MISC Use to check home glucose as directed 100 each 3  . Lancets (ACCU-CHEK SOFT TOUCH) lancets Use as instructed 100 each 2  . losartan (COZAAR) 100 MG tablet Take 1 tablet (100 mg total) by mouth daily. 90 tablet 3  . metoprolol tartrate (LOPRESSOR) 25 MG tablet Take 1 tablet (25 mg total) by mouth 2 (two) times daily. (Patient not taking: Reported on 11/01/2016) 180 tablet 3  . pravastatin (PRAVACHOL) 40 MG tablet TAKE ONE TABLET BY MOUTH ONCE DAILY 90 tablet 3  . sitaGLIPtin (JANUVIA) 50 MG tablet Take 1 tablet (50 mg total) by mouth daily. 30 tablet 12   No current facility-administered medications for this visit.     PHYSICAL EXAMINATION: ECOG PERFORMANCE STATUS: 0 - Asymptomatic  Vitals:   12/30/16 0903  BP: 137/67  Pulse: 71  Resp: 18  Temp: 97.9 F (36.6 C)   Filed Weights   12/30/16 0903  Weight: 167 lb 14.4 oz (76.2 kg)    GENERAL:alert, no distress and comfortable SKIN: skin color,  texture, turgor are normal, no rashes or significant lesions EYES: normal, Conjunctiva are pink and non-injected, sclera clear OROPHARYNX:no exudate, no erythema and lips, buccal mucosa, and tongue normal  NECK: supple, thyroid normal size, non-tender, without nodularity LYMPH:  no palpable lymphadenopathy in the cervical, axillary or inguinal LUNGS: clear to auscultation and percussion with normal breathing effort HEART: regular rate & rhythm and no murmurs and no lower extremity edema ABDOMEN:abdomen soft, non-tender and normal bowel sounds Musculoskeletal:no cyanosis of digits and no clubbing  NEURO: alert & oriented x 3 with fluent speech, no focal motor/sensory deficits  LABORATORY DATA:  I have reviewed the data as listed    Component Value Date/Time   NA 140 12/30/2016 0812   K 4.0 12/30/2016 0812   CL 109 04/23/2016 0345   CO2 26 12/30/2016 0812   GLUCOSE 139 12/30/2016 0812   BUN 27.0 (H) 12/30/2016 0812   CREATININE 2.0 (H) 12/30/2016 0812   CALCIUM 9.8 12/30/2016 0812   PROT 6.8 12/30/2016 0812   ALBUMIN 4.0 12/30/2016 0812   AST 36 (H) 12/30/2016 0812   ALT 35 12/30/2016 0812   ALKPHOS 74  12/30/2016 0812   BILITOT 0.50 12/30/2016 0812   GFRNONAA 34 (L) 04/23/2016 0345   GFRNONAA 33 (L) 09/11/2013 0920   GFRAA 40 (L) 04/23/2016 0345   GFRAA 38 (L) 09/11/2013 0920    No results found for: SPEP, UPEP  Lab Results  Component Value Date   WBC 11.1 (H) 12/30/2016   NEUTROABS 9.1 (H) 12/30/2016   HGB 13.9 12/30/2016   HCT 41.4 12/30/2016   MCV 84.5 12/30/2016   PLT 219 12/30/2016      Chemistry      Component Value Date/Time   NA 140 12/30/2016 0812   K 4.0 12/30/2016 0812   CL 109 04/23/2016 0345   CO2 26 12/30/2016 0812   BUN 27.0 (H) 12/30/2016 0812   CREATININE 2.0 (H) 12/30/2016 0812   GLU 382 (H) 01/31/2015 1551      Component Value Date/Time   CALCIUM 9.8 12/30/2016 0812   ALKPHOS 74 12/30/2016 0812   AST 36 (H) 12/30/2016 0812   ALT 35  12/30/2016 0812   BILITOT 0.50 12/30/2016 0812      ASSESSMENT & PLAN:  Diffuse large B-cell lymphoma of lymph nodes of multiple regions ([place]) Clinically, he has no evidence of disease recurrence. I will continue rituximab. I will see him back in 2 months  Adenocarcinoma of lung, stage 1, left ([place]) I review recent guidelines. He does not need adjuvant treatment. We'll monitor with periodic imaging study PET CT scan from 08/30/2016 show no evidence of disease.  We will repeat another imaging study in 6 months, due around the end of August 2018  Chronic kidney disease, stage III (moderate) He has chronic kidney disease and this is unrelated to the lymphoma.  Continue close monitoring. he will continue current medical management. I recommend close follow-up with primary care doctor for medication adjustment.   Orders Placed This Encounter  Procedures  . NM PET Image Restag (PS) Skull Base To Thigh    Standing Status:   Future    Standing Expiration Date:   03/02/2018    Order Specific Question:   Reason for Exam (SYMPTOM  OR DIAGNOSIS REQUIRED)    Answer:   staging lymphoma and lung ca    Order Specific Question:   Preferred imaging location?    Answer:   [facility]   All questions were answered. The patient knows to call the clinic with any problems, questions or concerns. No barriers to learning was detected. I spent 15 minutes counseling the patient face to face. The total time spent in the appointment was 20 minutes and more than 50% was on counseling and review of test results     Heath Lark, MD 12/31/2016 6:53 AM

## 2016-12-31 NOTE — Assessment & Plan Note (Signed)
I review recent guidelines. He does not need adjuvant treatment. We'll monitor with periodic imaging study PET CT scan from 08/30/2016 show no evidence of disease.  We will repeat another imaging study in 6 months, due around the end of August 2018

## 2017-01-25 ENCOUNTER — Ambulatory Visit (INDEPENDENT_AMBULATORY_CARE_PROVIDER_SITE_OTHER): Payer: Medicare Other | Admitting: Physician Assistant

## 2017-01-25 ENCOUNTER — Encounter: Payer: Self-pay | Admitting: Physician Assistant

## 2017-01-25 VITALS — BP 140/84 | HR 75 | Temp 97.5°F | Resp 18 | Ht 70.0 in | Wt 167.8 lb

## 2017-01-25 DIAGNOSIS — E785 Hyperlipidemia, unspecified: Secondary | ICD-10-CM | POA: Diagnosis not present

## 2017-01-25 DIAGNOSIS — J431 Panlobular emphysema: Secondary | ICD-10-CM

## 2017-01-25 DIAGNOSIS — I129 Hypertensive chronic kidney disease with stage 1 through stage 4 chronic kidney disease, or unspecified chronic kidney disease: Secondary | ICD-10-CM | POA: Diagnosis not present

## 2017-01-25 DIAGNOSIS — E1122 Type 2 diabetes mellitus with diabetic chronic kidney disease: Secondary | ICD-10-CM | POA: Diagnosis not present

## 2017-01-25 DIAGNOSIS — I10 Essential (primary) hypertension: Secondary | ICD-10-CM | POA: Diagnosis not present

## 2017-01-25 DIAGNOSIS — N183 Chronic kidney disease, stage 3 unspecified: Secondary | ICD-10-CM

## 2017-01-25 MED ORDER — SITAGLIPTIN PHOSPHATE 50 MG PO TABS: 50.0000 mg | ORAL_TABLET | Freq: Every day | ORAL | 12 refills | Status: DC

## 2017-01-25 MED ORDER — METOPROLOL TARTRATE 25 MG PO TABS: 25.0000 mg | ORAL_TABLET | Freq: Two times a day (BID) | ORAL | 3 refills | Status: DC

## 2017-01-25 MED ORDER — GLIPIZIDE ER 10 MG PO TB24: ORAL_TABLET | ORAL | 3 refills | Status: DC

## 2017-01-25 NOTE — Progress Notes (Signed)
[name], male    DOB: 04/11/1951, 66 y.o.   MRN: [id number]  PCP: Harrison Mons, PA-C  Chief Complaint  Patient presents with  . Diabetes    per pt states glucose has been "great" at home.  . Follow-up  . Medication Refill    Glipizide 24 hr tablet 10 MG    Subjective:   Presents for evaluation of diabetes.  He was started on Lantus insulin after A1C at his last visit increased from controlled to >8%. He's doing well, not having any problems with self injecting. Relates that his numbers at home are much better.  Eye exam is due.  ONE MORE chemo session, scheduled for next month, for the treatment of lung cancer. Plans to have port removed. Overall feeling really optimistic. Recall that his wife had just completed treatment for breast cancer when he was diagnosed with lymphoma. Upon completion of his treatment, her breast cancer recurred. He then developed unrelated lung cancer shortly after her death.   Review of Systems Denies chest pain, shortness of breath, HA, dizziness, vision change, nausea, vomiting, diarrhea, constipation, melena, hematochezia, dysuria, increased urinary urgency or frequency, increased hunger or thirst, unintentional weight change, unexplained myalgias or arthralgias, rash.    Patient Active Problem List   Diagnosis Date Noted  . COPD (chronic obstructive pulmonary disease) with emphysema ([place]) 10/26/2016  . Incisional pain 07/02/2016  . Adenocarcinoma of lung, stage 1, left ([place]) 04/15/2016  . Essential hypertension, benign 03/02/2016  . Port catheter in place 10/31/2015  . Nodule of left lung 09/11/2015  . Restrictive lung disease 04/23/2015  . Reactive thrombocytosis 01/09/2015  . Elevated liver enzymes 01/09/2015  . Leukocytosis 12/19/2014  . Thrombocytopenia due to drugs 12/11/2014  . Diffuse large B-cell lymphoma of lymph nodes of multiple regions ([place]) 11/04/2014  . Anemia in neoplastic disease 11/04/2014  .  Hyperlipidemia 05/11/2013  . Hypertensive renal disease, stage 1-4 or unspecified chronic kidney disease   . DM (diabetes mellitus), type 2 with renal complications ([place])   . Chronic kidney disease, stage III (moderate)   . History of tobacco abuse   . Erectile dysfunction      Prior to Admission medications   Medication Sig Start Date End Date Taking? Authorizing Provider  amLODipine (NORVASC) 10 MG tablet TAKE ONE TABLET BY MOUTH ONCE DAILY 10/26/16  Yes Aracelia Brinson, PA-C  aspirin 81 MG tablet Take 81 mg by mouth daily.   Yes [provider]  fish oil-omega-3 fatty acids 1000 MG capsule Take 1 g by mouth daily.   Yes [provider]  glipiZIDE (GLUCOTROL XL) 10 MG 24 hr tablet TAKE ONE TABLET BY MOUTH ONCE DAILY 10/26/16  Yes Dalvin Clipper, PA-C  glucose blood (GLUCOSE METER TEST) test strip Use as instructed 11/30/16  Yes Maryjo Ragon, PA-C  hydrochlorothiazide (HYDRODIURIL) 25 MG tablet Take 1 tablet (25 mg total) by mouth daily. 10/26/16  Yes Shalin Linders, PA-C  Insulin Glargine (LANTUS) 100 UNIT/ML Solostar Pen Inject 10 Units into the skin daily at 10 pm. 11/05/16  Yes Jacqulynn Cadet, Kimmy Totten, PA-C  Insulin Pen Needle 32G X 4 MM MISC Use to check home glucose as directed 11/30/16  Yes Inez Rosato, PA-C  Lancets (ACCU-CHEK SOFT TOUCH) lancets Use as instructed 11/08/16  Yes Kayann Maj, PA-C  losartan (COZAAR) 100 MG tablet Take 1 tablet (100 mg total) by mouth daily. 10/26/16  Yes Amaani Guilbault, PA-C  pravastatin (PRAVACHOL) 40 MG tablet TAKE ONE TABLET BY  MOUTH ONCE DAILY 10/26/16  Yes Pranavi Aure, PA-C  sitaGLIPtin (JANUVIA) 50 MG tablet Take 1 tablet (50 mg total) by mouth daily. 01/27/16  Yes Grover Robinson, PA-C  metoprolol tartrate (LOPRESSOR) 25 MG tablet Take 1 tablet (25 mg total) by mouth 2 (two) times daily. Patient not taking: Reported on 01/25/2017 10/26/16   Harrison Mons, PA-C     Allergies  Allergen Reactions  . Ace Inhibitors Hives  .  Rituximab Other (See Comments)    This medication caused him to sweat & shake. Patient is able to tolerate non-RIR rituximab        Objective:  Physical Exam  Constitutional: He is oriented to person, place, and time. He appears well-developed and well-nourished. He is active and cooperative. No distress.  BP 140/84   Pulse 75   Temp (!) 97.5 F (36.4 C) (Oral)   Resp 18   Ht 5\' 10"  (1.778 m)   Wt 167 lb 12.8 oz (76.1 kg)   SpO2 98%   BMI 24.08 kg/m   HENT:  Head: Normocephalic and atraumatic.  Right Ear: Hearing normal.  Left Ear: Hearing normal.  Eyes: Conjunctivae are normal. No scleral icterus.  Neck: Normal range of motion. Neck supple. No thyromegaly present.  Cardiovascular: Normal rate, regular rhythm and normal heart sounds.   Pulses:      Radial pulses are 2+ on the right side, and 2+ on the left side.  Pulmonary/Chest: Effort normal and breath sounds normal.  Lymphadenopathy:       Head (right side): No tonsillar, no preauricular, no posterior auricular and no occipital adenopathy present.       Head (left side): No tonsillar, no preauricular, no posterior auricular and no occipital adenopathy present.    He has no cervical adenopathy.       Right: No supraclavicular adenopathy present.       Left: No supraclavicular adenopathy present.  Neurological: He is alert and oriented to person, place, and time. No sensory deficit.  Skin: Skin is warm, dry and intact. No rash noted. No cyanosis or erythema. Nails show no clubbing.  Psychiatric: He has a normal mood and affect. His speech is normal and behavior is normal.    Diabetic Foot Exam - Simple   Simple Foot Form Diabetic Foot exam was performed with the following findings:  Yes 01/25/2017  8:30 AM  Visual Inspection No deformities, no ulcerations, no other skin breakdown bilaterally:  Yes Sensation Testing Intact to touch and monofilament testing bilaterally:  Yes Pulse Check Posterior Tibialis and Dorsalis  pulse intact bilaterally:  Yes Comments       Assessment & Plan:   Problem List Items Addressed This Visit    Hypertensive renal disease, stage 1-4 or unspecified chronic kidney disease (Chronic)    Await labs. Adjust regimen as indicated by results. Continue to maximize HTN and DM control.      Relevant Medications   metoprolol tartrate (LOPRESSOR) 25 MG tablet   DM (diabetes mellitus), type 2 with renal complications ([facility]) (Chronic)    Await labs. Adjust regimen as indicated by results. Treatment options are somewhat limited by renal function.      Relevant Medications   sitaGLIPtin (JANUVIA) 50 MG tablet   glipiZIDE (GLUCOTROL XL) 10 MG 24 hr tablet   Other Relevant Orders   Hemoglobin A1c (Completed)   Hyperlipidemia    Await labs. Adjust regimen as indicated by results.       Relevant Medications   metoprolol tartrate (  LOPRESSOR) 25 MG tablet   Other Relevant Orders   Lipid panel (Completed)   Essential hypertension, benign - Primary    Controlled. Continue current treatment.      Relevant Medications   metoprolol tartrate (LOPRESSOR) 25 MG tablet   COPD (chronic obstructive pulmonary disease) with emphysema (H[facility])    Stable. Complete treatment for lung cancer. Continue to monitor.          Return in about 3 months (around 04/27/2017) for re-evaluation of diabetes, cholesterol and flu vaccine.   Fara Chute, PA-C [facility] [place]

## 2017-01-25 NOTE — Patient Instructions (Addendum)
Schedule your eye exam!  Ask your pharmacist about switching the Januvia to [facility] it be less expensive for you?    IF you received an x-ray today, you will receive an invoice from [facility]. Please contact [facility] at [phone] with questions or concerns regarding your invoice.   IF you received labwork today, you will receive an invoice from [facility]. Please contact [facility] at [phone] with questions or concerns regarding your invoice.   Our billing staff will not be able to assist you with questions regarding bills from these companies.  You will be contacted with the lab results as soon as they are available. The fastest way to get your results is to activate your My Chart account. Instructions are located on the last page of this paperwork. If you have not heard from [place] regarding the results in 2 weeks, please contact this office.

## 2017-01-26 ENCOUNTER — Encounter: Payer: Self-pay | Admitting: Physician Assistant

## 2017-01-26 LAB — LIPID PANEL
CHOL/HDL RATIO: 3.3 ratio (ref 0.0–5.0)
Cholesterol, Total: 119 mg/dL (ref 100–199)
HDL: 36 mg/dL — AB (ref >39)
LDL CALC: 60 mg/dL (ref 0–99)
TRIGLYCERIDES: 116 mg/dL (ref 0–149)
VLDL CHOLESTEROL CAL: 23 mg/dL (ref 5–40)

## 2017-01-26 LAB — HEMOGLOBIN A1C
Est. average glucose Bld gHb Est-mCnc: 146 mg/dL
HEMOGLOBIN A1C: 6.7 % — AB (ref 4.8–5.6)

## 2017-01-26 NOTE — Assessment & Plan Note (Signed)
Await labs. Adjust regimen as indicated by results. Treatment options are somewhat limited by renal function.

## 2017-01-26 NOTE — Assessment & Plan Note (Signed)
Stable. Complete treatment for lung cancer. Continue to monitor.

## 2017-01-26 NOTE — Assessment & Plan Note (Signed)
Await labs. Adjust regimen as indicated by results.  

## 2017-01-26 NOTE — Assessment & Plan Note (Signed)
Await labs. Adjust regimen as indicated by results. Continue to maximize HTN and DM control.

## 2017-01-26 NOTE — Assessment & Plan Note (Signed)
Controlled. Continue current treatment. 

## 2017-02-25 ENCOUNTER — Encounter (HOSPITAL_COMMUNITY)
Admission: RE | Admit: 2017-02-25 | Discharge: 2017-02-25 | Disposition: A | Discharge status: Home / Self Care | Payer: Medicare Other | Source: Ambulatory Visit | Attending: Hematology and Oncology | Admitting: Hematology and Oncology

## 2017-02-25 DIAGNOSIS — C3492 Malignant neoplasm of unspecified part of left bronchus or lung: Secondary | ICD-10-CM | POA: Insufficient documentation

## 2017-02-25 DIAGNOSIS — C8338 Diffuse large B-cell lymphoma, lymph nodes of multiple sites: Secondary | ICD-10-CM

## 2017-02-25 LAB — GLUCOSE, CAPILLARY: Glucose-Capillary: 123 mg/dL — ABNORMAL HIGH (ref 65–99)

## 2017-02-25 MED ORDER — FLUDEOXYGLUCOSE F - 18 (FDG) INJECTION
8.4000 | Freq: Once | INTRAVENOUS | Status: AC | PRN
  Administered 2017-02-25: 8.4 via INTRAVENOUS

## 2017-02-28 ENCOUNTER — Ambulatory Visit (HOSPITAL_BASED_OUTPATIENT_CLINIC_OR_DEPARTMENT_OTHER): Payer: Medicare Other

## 2017-02-28 ENCOUNTER — Ambulatory Visit (HOSPITAL_BASED_OUTPATIENT_CLINIC_OR_DEPARTMENT_OTHER): Payer: Medicare Other | Admitting: Hematology and Oncology

## 2017-02-28 ENCOUNTER — Ambulatory Visit: Payer: Medicare Other

## 2017-02-28 ENCOUNTER — Telehealth: Payer: Self-pay | Admitting: Hematology and Oncology

## 2017-02-28 ENCOUNTER — Encounter: Payer: Self-pay | Admitting: Hematology and Oncology

## 2017-02-28 ENCOUNTER — Other Ambulatory Visit (HOSPITAL_BASED_OUTPATIENT_CLINIC_OR_DEPARTMENT_OTHER): Payer: Medicare Other

## 2017-02-28 ENCOUNTER — Other Ambulatory Visit: Payer: Self-pay | Admitting: Hematology and Oncology

## 2017-02-28 ENCOUNTER — Telehealth: Payer: Self-pay | Admitting: *Deleted

## 2017-02-28 VITALS — BP 121/67 | HR 51 | Temp 97.8°F | Resp 17

## 2017-02-28 VITALS — BP 142/69 | HR 61 | Temp 98.1°F | Resp 18 | Wt 169.1 lb

## 2017-02-28 DIAGNOSIS — C8338 Diffuse large B-cell lymphoma, lymph nodes of multiple sites: Secondary | ICD-10-CM | POA: Diagnosis not present

## 2017-02-28 DIAGNOSIS — N183 Chronic kidney disease, stage 3 unspecified: Secondary | ICD-10-CM

## 2017-02-28 DIAGNOSIS — Z95828 Presence of other vascular implants and grafts: Secondary | ICD-10-CM

## 2017-02-28 DIAGNOSIS — Z85118 Personal history of other malignant neoplasm of bronchus and lung: Secondary | ICD-10-CM | POA: Diagnosis not present

## 2017-02-28 DIAGNOSIS — C3492 Malignant neoplasm of unspecified part of left bronchus or lung: Secondary | ICD-10-CM

## 2017-02-28 DIAGNOSIS — Z5112 Encounter for antineoplastic immunotherapy: Secondary | ICD-10-CM | POA: Diagnosis not present

## 2017-02-28 LAB — COMPREHENSIVE METABOLIC PANEL
ALK PHOS: 71 U/L (ref 40–150)
ALT: 30 U/L (ref 0–55)
ANION GAP: 8 meq/L (ref 3–11)
AST: 25 U/L (ref 5–34)
Albumin: 3.8 g/dL (ref 3.5–5.0)
BILIRUBIN TOTAL: 0.79 mg/dL (ref 0.20–1.20)
BUN: 31 mg/dL — ABNORMAL HIGH (ref 7.0–26.0)
CALCIUM: 9.3 mg/dL (ref 8.4–10.4)
CO2: 26 mEq/L (ref 22–29)
CREATININE: 2.2 mg/dL — AB (ref 0.7–1.3)
Chloride: 105 mEq/L (ref 98–109)
EGFR: 35 mL/min/{1.73_m2} — ABNORMAL LOW (ref >90)
Glucose: 135 mg/dl (ref 70–140)
Potassium: 3.8 mEq/L (ref 3.5–5.1)
Sodium: 139 mEq/L (ref 136–145)
TOTAL PROTEIN: 6.7 g/dL (ref 6.4–8.3)

## 2017-02-28 LAB — CBC WITH DIFFERENTIAL/PLATELET
BASO%: 0.5 % (ref 0.0–2.0)
BASOS ABS: 0.1 10*3/uL (ref 0.0–0.1)
EOS%: 1 % (ref 0.0–7.0)
Eosinophils Absolute: 0.1 10*3/uL (ref 0.0–0.5)
HCT: 41.9 % (ref 38.4–49.9)
HEMOGLOBIN: 13.9 g/dL (ref 13.0–17.1)
LYMPH%: 9.5 % — AB (ref 14.0–49.0)
MCH: 28.2 pg (ref 27.2–33.4)
MCHC: 33.2 g/dL (ref 32.0–36.0)
MCV: 84.9 fL (ref 79.3–98.0)
MONO#: 0.7 10*3/uL (ref 0.1–0.9)
MONO%: 6.4 % (ref 0.0–14.0)
NEUT%: 82.6 % — ABNORMAL HIGH (ref 39.0–75.0)
NEUTROS ABS: 9.6 10*3/uL — AB (ref 1.5–6.5)
Platelets: 185 10*3/uL (ref 140–400)
RBC: 4.94 10*6/uL (ref 4.20–5.82)
RDW: 13.8 % (ref 11.0–14.6)
WBC: 11.6 10*3/uL — AB (ref 4.0–10.3)
lymph#: 1.1 10*3/uL (ref 0.9–3.3)

## 2017-02-28 MED ORDER — RITUXIMAB CHEMO INJECTION 500 MG/50ML
375.0000 mg/m2 | Freq: Once | INTRAVENOUS | Status: AC
  Administered 2017-02-28: 700 mg via INTRAVENOUS
  Filled 2017-02-28: qty 50

## 2017-02-28 MED ORDER — DIPHENHYDRAMINE HCL 25 MG PO CAPS
ORAL_CAPSULE | ORAL | Status: AC
  Filled 2017-02-28: qty 2

## 2017-02-28 MED ORDER — SODIUM CHLORIDE 0.9 % IJ SOLN
10.0000 mL | INTRAMUSCULAR | Status: DC | PRN
  Administered 2017-02-28: 10 mL via INTRAVENOUS
  Filled 2017-02-28: qty 10

## 2017-02-28 MED ORDER — SODIUM CHLORIDE 0.9 % IJ SOLN
10.0000 mL | INTRAMUSCULAR | Status: DC | PRN
  Administered 2017-02-28: 10 mL
  Filled 2017-02-28: qty 10

## 2017-02-28 MED ORDER — SODIUM CHLORIDE 0.9 % IV SOLN
Freq: Once | INTRAVENOUS | Status: AC
  Administered 2017-02-28: 10:00:00 via INTRAVENOUS

## 2017-02-28 MED ORDER — DIPHENHYDRAMINE HCL 25 MG PO CAPS
50.0000 mg | ORAL_CAPSULE | Freq: Once | ORAL | Status: AC
  Administered 2017-02-28: 50 mg via ORAL

## 2017-02-28 MED ORDER — ACETAMINOPHEN 325 MG PO TABS
650.0000 mg | ORAL_TABLET | Freq: Once | ORAL | Status: AC
  Administered 2017-02-28: 650 mg via ORAL

## 2017-02-28 MED ORDER — HEPARIN SOD (PORK) LOCK FLUSH 100 UNIT/ML IV SOLN
500.0000 [IU] | Freq: Once | INTRAVENOUS | Status: AC | PRN
  Administered 2017-02-28: 500 [IU]
  Filled 2017-02-28: qty 5

## 2017-02-28 MED ORDER — ACETAMINOPHEN 325 MG PO TABS
ORAL_TABLET | ORAL | Status: AC
  Filled 2017-02-28: qty 2

## 2017-02-28 NOTE — Telephone Encounter (Signed)
Called Dr Darrel Hoover office to have port removed.They will call patient to schedule removal

## 2017-02-28 NOTE — Patient Instructions (Signed)
[facility] Discharge Instructions for Patients Receiving Chemotherapy  Today you received the following chemotherapy agents: Rituxan   To help prevent nausea and vomiting after your treatment, we encourage you to take your nausea medication as directed.    If you develop nausea and vomiting that is not controlled by your nausea medication, call the clinic.   BELOW ARE SYMPTOMS THAT SHOULD BE REPORTED IMMEDIATELY:  *FEVER GREATER THAN 100.5 F  *CHILLS WITH OR WITHOUT FEVER  NAUSEA AND VOMITING THAT IS NOT CONTROLLED WITH YOUR NAUSEA MEDICATION  *UNUSUAL SHORTNESS OF BREATH  *UNUSUAL BRUISING OR BLEEDING  TENDERNESS IN MOUTH AND THROAT WITH OR WITHOUT PRESENCE OF ULCERS  *URINARY PROBLEMS  *BOWEL PROBLEMS  UNUSUAL RASH Items with * indicate a potential emergency and should be followed up as soon as possible.  Feel free to call the clinic you have any questions or concerns. The clinic phone number is [phone].  Please show the CHEMO ALERT CARD at check-in to the Emergency Department and triage nurse.   

## 2017-02-28 NOTE — Telephone Encounter (Signed)
Gave patient avs and calendar with upcoming appts.  °

## 2017-02-28 NOTE — Progress Notes (Signed)
Per Dr Alvy Bimler ok to tx today w/ creatinine 2.2.

## 2017-02-28 NOTE — Progress Notes (Signed)
[facility] OFFICE PROGRESS NOTE  Patient Care Team: Harrison Mons, PA-C as PCP - General (Physician Assistant) Corliss Parish, MD as Consulting Physician (Nephrology) Fanny Skates, MD as Consulting Physician (General Surgery) Heath Lark, MD as Consulting Physician (Hematology and Oncology) Javier Glazier, MD as Consulting Physician (Pulmonary Disease) Juanita Craver, MD as Consulting Physician (Gastroenterology)  SUMMARY OF ONCOLOGIC HISTORY: Oncology History   Lymphoma   Staging form: Lymphoid Neoplasms, AJCC 6th Edition     Clinical stage from 11/21/2014: Stage IV - Signed by Heath Lark, MD on 11/21/2014       Diffuse large B-cell lymphoma of lymph nodes of multiple regions ([place])   11/13/2014 Imaging    PEt scan showed widespread disease including possible splenic involvement      11/19/2014 Surgery    He has excisional LN biopsy and port placement      11/19/2014 Pathology Results    Biopsy of lymph nodes showed DLBCL      11/29/2014 - 01/31/2015 Chemotherapy    He received 4 cycles of RCHOP chemotherapy complicated by severe infusion reaction.      12/19/2014 - 01/09/2015 Chemotherapy    He received 2 cycles of prophylactic IT chemotherapy.      01/31/2015 Adverse Reaction    I reduce the dose of his chemotherapy, prednisone and dexamethasone due to poorly controlled diabetes and hyperglycemia      02/12/2015 Imaging     repeat PET CT scan show complete response to chemotherapy but new lung infiltrates      09/09/2015 Imaging    PET CT showed complete response except for new inflammatory change in the lung      03/01/2016 Imaging    CT chest showed mild interval growth of subsolid left upper lobe pulmonary nodule, which was hypermetabolic on 26/83/4196 PET-CT. A primary bronchogenic adenocarcinoma cannot be excluded. Thoracic surgical consultation is advised. No lymphadenopathy or other findings of metastatic disease in the chest. Moderate  emphysema with mild diffuse bronchial wall thickening, suggesting COPD. Aortic atherosclerosis. Stable small pericardial effusion/thickening.      08/30/2016 PET scan    No evidence of hypermetabolic residual or recurrent lymphoma. 2. Interval left upper lobe wedge resection for adenocarcinoma. Healing left rib fractures which are likely postsurgical. 3. Aortic atherosclerosis.      02/25/2017 PET scan    1. No abnormal nodal enlargement or hypermetabolic activity to suggest recurrent lymphoma. Postoperative findings in the left chest without findings of recurrent left lung cancer. 2. Aortic Atherosclerosis (ICD10-I70.0) and Emphysema (ICD10-J43.9).       Adenocarcinoma of lung, stage 1, left ([place])   03/24/2016 Procedure    He underwent electromagnetic navigational bronchoscopy with needle aspirations, brushings, and biopsies.      03/24/2016 Pathology Results    Accession: [id number] biopsy was positive for adenocarcinoma      04/15/2016 Initial Diagnosis    Adenocarcinoma of lung, stage 1, left ([place])     04/15/2016 Surgery    He underwent left video-assisted thoracoscopy, thoracoscopic left upper lobectomy, mediastinal lymph node dissection, on-Q local anesthetic catheter placement      04/15/2016 Pathology Results    Accession: [id number] surgical specimen revealed adenocarcinoma, 2.2 cm with negative margin and negative LN involvement       INTERVAL HISTORY: Please see below for problem oriented charting. He returns with his daughter for final treatment of rituximab He feels well No recent cough, chest pain or shortness of breath No new lymphadenopathy Denies recent reaction to treatment.  REVIEW OF SYSTEMS:   Constitutional: Denies fevers, chills or abnormal weight loss Eyes: Denies blurriness of vision Ears, nose, mouth, throat, and face: Denies mucositis or sore throat Respiratory: Denies cough, dyspnea or wheezes Cardiovascular: Denies palpitation, chest  discomfort or lower extremity swelling Gastrointestinal:  Denies nausea, heartburn or change in bowel habits Skin: Denies abnormal skin rashes Lymphatics: Denies new lymphadenopathy or easy bruising Neurological:Denies numbness, tingling or new weaknesses Behavioral/Psych: Mood is stable, no new changes  All other systems were reviewed with the patient and are negative.  I have reviewed the past medical history, past surgical history, social history and family history with the patient and they are unchanged from previous note.  ALLERGIES:  is allergic to ace inhibitors and rituximab.  MEDICATIONS:  Current Outpatient Prescriptions  Medication Sig Dispense Refill  . amLODipine (NORVASC) 10 MG tablet TAKE ONE TABLET BY MOUTH ONCE DAILY 90 tablet 3  . aspirin 81 MG tablet Take 81 mg by mouth daily.    . fish oil-omega-3 fatty acids 1000 MG capsule Take 1 g by mouth daily.    Marland Kitchen glipiZIDE (GLUCOTROL XL) 10 MG 24 hr tablet TAKE ONE TABLET BY MOUTH ONCE DAILY 90 tablet 3  . glucose blood (GLUCOSE METER TEST) test strip Use as instructed 100 each 3  . hydrochlorothiazide (HYDRODIURIL) 25 MG tablet Take 1 tablet (25 mg total) by mouth daily. 90 tablet 3  . Insulin Glargine (LANTUS) 100 UNIT/ML Solostar Pen Inject 10 Units into the skin daily at 10 pm. 15 mL 11  . Insulin Pen Needle 32G X 4 MM MISC Use to check home glucose as directed 100 each 3  . Lancets (ACCU-CHEK SOFT TOUCH) lancets Use as instructed 100 each 2  . losartan (COZAAR) 100 MG tablet Take 1 tablet (100 mg total) by mouth daily. 90 tablet 3  . metoprolol tartrate (LOPRESSOR) 25 MG tablet Take 1 tablet (25 mg total) by mouth 2 (two) times daily. 180 tablet 3  . pravastatin (PRAVACHOL) 40 MG tablet TAKE ONE TABLET BY MOUTH ONCE DAILY 90 tablet 3  . sitaGLIPtin (JANUVIA) 50 MG tablet Take 1 tablet (50 mg total) by mouth daily. 30 tablet 12   No current facility-administered medications for this visit.    Facility-Administered  Medications Ordered in Other Visits  Medication Dose Route Frequency Provider Last Rate Last Dose  . heparin lock flush 100 unit/mL  500 Units Intracatheter Once PRN Alvy Bimler, James Senn, MD      . sodium chloride 0.9 % injection 10 mL  10 mL Intracatheter PRN Alvy Bimler, Mackie Goon, MD        PHYSICAL EXAMINATION: ECOG PERFORMANCE STATUS: 0 - Asymptomatic  Vitals:   02/28/17 0912  BP: (!) 142/69  Pulse: 61  Resp: 18  Temp: 98.1 F (36.7 C)  SpO2: 100%   Filed Weights   02/28/17 0912  Weight: 169 lb 1.6 oz (76.7 kg)    GENERAL:alert, no distress and comfortable SKIN: skin color, texture, turgor are normal, no rashes or significant lesions EYES: normal, Conjunctiva are pink and non-injected, sclera clear OROPHARYNX:no exudate, no erythema and lips, buccal mucosa, and tongue normal  NECK: supple, thyroid normal size, non-tender, without nodularity LYMPH:  no palpable lymphadenopathy in the cervical, axillary or inguinal LUNGS: clear to auscultation and percussion with normal breathing effort HEART: regular rate & rhythm and no murmurs and no lower extremity edema ABDOMEN:abdomen soft, non-tender and normal bowel sounds Musculoskeletal:no cyanosis of digits and no clubbing  NEURO: alert & oriented x 3  with fluent speech, no focal motor/sensory deficits  LABORATORY DATA:  I have reviewed the data as listed    Component Value Date/Time   NA 139 02/28/2017 0829   K 3.8 02/28/2017 0829   CL 109 04/23/2016 0345   CO2 26 02/28/2017 0829   GLUCOSE 135 02/28/2017 0829   BUN 31.0 (H) 02/28/2017 0829   CREATININE 2.2 (H) 02/28/2017 0829   CALCIUM 9.3 02/28/2017 0829   PROT 6.7 02/28/2017 0829   ALBUMIN 3.8 02/28/2017 0829   AST 25 02/28/2017 0829   ALT 30 02/28/2017 0829   ALKPHOS 71 02/28/2017 0829   BILITOT 0.79 02/28/2017 0829   GFRNONAA 34 (L) 04/23/2016 0345   GFRNONAA 33 (L) 09/11/2013 0920   GFRAA 40 (L) 04/23/2016 0345   GFRAA 38 (L) 09/11/2013 0920    No results found for: SPEP,  UPEP  Lab Results  Component Value Date   WBC 11.6 (H) 02/28/2017   NEUTROABS 9.6 (H) 02/28/2017   HGB 13.9 02/28/2017   HCT 41.9 02/28/2017   MCV 84.9 02/28/2017   PLT 185 02/28/2017      Chemistry      Component Value Date/Time   NA 139 02/28/2017 0829   K 3.8 02/28/2017 0829   CL 109 04/23/2016 0345   CO2 26 02/28/2017 0829   BUN 31.0 (H) 02/28/2017 0829   CREATININE 2.2 (H) 02/28/2017 0829   GLU 382 (H) 01/31/2015 1551      Component Value Date/Time   CALCIUM 9.3 02/28/2017 0829   ALKPHOS 71 02/28/2017 0829   AST 25 02/28/2017 0829   ALT 30 02/28/2017 0829   BILITOT 0.79 02/28/2017 0829       RADIOGRAPHIC STUDIES: I have personally reviewed the radiological images as listed and agreed with the findings in the report. Nm Pet Image Restag (ps) Skull Base To Thigh  Result Date: 02/25/2017 CLINICAL DATA:  Subsequent treatment strategy for diffuse large B-cell lymphoma. Left lung cancer. EXAM: NUCLEAR MEDICINE PET SKULL BASE TO THIGH TECHNIQUE: 8.4 mCi F-18 FDG was injected intravenously. Full-ring PET imaging was performed from the skull base to thigh after the radiotracer. CT data was obtained and used for attenuation correction and anatomic localization. FASTING BLOOD GLUCOSE:  Value: 123 mg/dl COMPARISON:  Multiple exams, including 08/30/2016 FINDINGS: NECK No hypermetabolic lymph nodes in the neck. Slight asymmetry of palatine tonsillar activity but probably physiologic/incidental. CHEST No hypermetabolic mediastinal or hilar nodes. No suspicious pulmonary nodules on the CT data. Right sided Port-A-Cath tip: SVC. Right axillary clips noted. Atherosclerotic calcification of the aortic arch and branch vessels. Centrilobular emphysema. Subsegmental atelectasis along the right hemidiaphragm. Stable postoperative findings in the left lung. ABDOMEN/PELVIS No abnormal hypermetabolic activity within the liver, pancreas, adrenal glands, or spleen. No hypermetabolic lymph nodes in the  abdomen or pelvis. Aortoiliac atherosclerotic vascular disease. SKELETON No focal hypermetabolic activity to suggest skeletal metastasis. Late healing response of left-sided ribs as noted previously. Incidental lipoma of the left external oblique muscle, image 137/4, no accentuated metabolic activity. IMPRESSION: 1. No abnormal nodal enlargement or hypermetabolic activity to suggest recurrent lymphoma. Postoperative findings in the left chest without findings of recurrent left lung cancer. 2. Aortic Atherosclerosis (ICD10-I70.0) and Emphysema (ICD10-J43.9). Electronically Signed   By: Van Clines M.D.   On: 02/25/2017 09:16    ASSESSMENT & PLAN:  Diffuse large B-cell lymphoma of lymph nodes of multiple regions ([place]) Clinically, he has no evidence of disease recurrence. He will receive the last dose of rituximab today After that,  I will get his port removed I plan to see him back again in 6 months with history, physical examination and blood work only  Adenocarcinoma of lung, stage 1, left ([place]) I review recent guidelines. He does not need adjuvant treatment. PET CT scan from 02/25/2017 show no evidence of disease He will continue surveillance imaging once a year  Chronic kidney disease, stage III (moderate) He has chronic kidney disease and this is unrelated to the lymphoma.  Continue close monitoring. he will continue current medical management.   Orders Placed This Encounter  Procedures  . IR Removal Tun Access W/ Port W/O FL    Standing Status:   Future    Standing Expiration Date:   04/30/2018    Order Specific Question:   Reason for exam:    Answer:   port no longer needed    Order Specific Question:   Preferred Imaging Location?    Answer:   [facility]   All questions were answered. The patient knows to call the clinic with any problems, questions or concerns. No barriers to learning was detected. I spent 15 minutes counseling the patient face to face. The total  time spent in the appointment was 20 minutes and more than 50% was on counseling and review of test results     Heath Lark, MD 02/28/2017 1:39 PM

## 2017-02-28 NOTE — Assessment & Plan Note (Addendum)
He has chronic kidney disease and this is unrelated to the lymphoma.  Continue close monitoring. he will continue current medical management.

## 2017-02-28 NOTE — Assessment & Plan Note (Signed)
I review recent guidelines. He does not need adjuvant treatment. PET CT scan from 02/25/2017 show no evidence of disease He will continue surveillance imaging once a year

## 2017-02-28 NOTE — Assessment & Plan Note (Signed)
Clinically, he has no evidence of disease recurrence. He will receive the last dose of rituximab today After that, I will get his port removed I plan to see him back again in 6 months with history, physical examination and blood work only

## 2017-03-16 ENCOUNTER — Telehealth: Payer: Self-pay

## 2017-03-16 NOTE — Telephone Encounter (Signed)
Called pt to schedule medicare annual wellness visit with nurse health advisor. Pt reports that his insurance company offered to come out and do this visit in his home but he declined. Pt says he will think about it and schedule with the office if he wants to come in for awv.    [name] [name], B.A.  Care Guide [phone]

## 2017-04-04 ENCOUNTER — Other Ambulatory Visit: Payer: Self-pay | Admitting: Thoracic Surgery (Cardiothoracic Vascular Surgery)

## 2017-04-04 DIAGNOSIS — C3490 Malignant neoplasm of unspecified part of unspecified bronchus or lung: Secondary | ICD-10-CM

## 2017-04-05 ENCOUNTER — Encounter: Payer: Self-pay | Admitting: Thoracic Surgery (Cardiothoracic Vascular Surgery)

## 2017-04-05 ENCOUNTER — Ambulatory Visit
Admission: RE | Admit: 2017-04-05 | Discharge: 2017-04-05 | Disposition: A | Discharge status: Home / Self Care | Payer: Medicare Other | Source: Ambulatory Visit | Attending: Thoracic Surgery (Cardiothoracic Vascular Surgery) | Admitting: Thoracic Surgery (Cardiothoracic Vascular Surgery)

## 2017-04-05 ENCOUNTER — Ambulatory Visit (INDEPENDENT_AMBULATORY_CARE_PROVIDER_SITE_OTHER): Payer: Medicare Other | Admitting: Thoracic Surgery (Cardiothoracic Vascular Surgery)

## 2017-04-05 VITALS — BP 136/75 | HR 51 | Ht 70.0 in | Wt 169.0 lb

## 2017-04-05 DIAGNOSIS — Z902 Acquired absence of lung [part of]: Secondary | ICD-10-CM | POA: Diagnosis not present

## 2017-04-05 DIAGNOSIS — C3412 Malignant neoplasm of upper lobe, left bronchus or lung: Secondary | ICD-10-CM | POA: Diagnosis not present

## 2017-04-05 DIAGNOSIS — C3490 Malignant neoplasm of unspecified part of unspecified bronchus or lung: Secondary | ICD-10-CM

## 2017-04-05 NOTE — Progress Notes (Signed)
[address]       [name] [address]             [phone]    HPI: [name] [name] returns for a scheduled 1 year follow-up visit  [name] [name] is a 66 year old man with a history of large cell B-cell lymphoma, stage IA adenocarcinoma left upper lobe, remote history of tobacco abuse (quit 2010), stage III chronic kidney disease, hypertension, remote history of ethanol abuse, hyperparathyroidism, and type 2 diabetes. He was found have a left upper lobe lung nodule on CT scan done for follow-up of a large cell B-cell lymphoma. I did a thoracoscopic left upper lobectomy on 04/14/2016. The nodule turned out to be a stage IA adenocarcinoma. His nodes were negative.  Postoperatively he had hoarseness. That persists, but does not bother him.  He denies shortness of breath, hemoptysis, wheezing, or chest pain. Overall he feels well.  Past Medical History:  Diagnosis Date  . Anemia   . Cancer ([place]) 2016   lymphoma  . Chronic kidney disease (CKD), stage III (moderate) ([facility])   . Chronic kidney disease, stage III (moderate) ([facility])   . Cramps, extremity    hx of in legs bilat; currently having issues in toes bilat   . Diabetes ([place])   . Erectile dysfunction   . Essential hypertension, benign   . History of ETOH abuse    quit 1999  . History of tobacco abuse    quit 12/2008  . Hyperparathyroidism ([place])    secondary / renal per H&P with [facility] 07/17/2014   . Pulmonary infiltrate present on computed tomography 02/20/2015  . Shortness of breath dyspnea    exertion  . Type II or unspecified type diabetes mellitus with renal manifestations, not stated as uncontrolled(250.40)      Current Outpatient Prescriptions  Medication Sig Dispense Refill  . amLODipine (NORVASC) 10 MG tablet TAKE ONE TABLET BY MOUTH ONCE DAILY 90 tablet 3  . aspirin 81 MG tablet Take 81 mg by mouth daily.    . fish oil-omega-3 fatty acids 1000 MG capsule Take 1 g by mouth daily.    Marland Kitchen glipiZIDE  (GLUCOTROL XL) 10 MG 24 hr tablet TAKE ONE TABLET BY MOUTH ONCE DAILY 90 tablet 3  . glucose blood (GLUCOSE METER TEST) test strip Use as instructed 100 each 3  . hydrochlorothiazide (HYDRODIURIL) 25 MG tablet Take 1 tablet (25 mg total) by mouth daily. 90 tablet 3  . Insulin Glargine (LANTUS) 100 UNIT/ML Solostar Pen Inject 10 Units into the skin daily at 10 pm. 15 mL 11  . Insulin Pen Needle 32G X 4 MM MISC Use to check home glucose as directed 100 each 3  . Lancets (ACCU-CHEK SOFT TOUCH) lancets Use as instructed 100 each 2  . losartan (COZAAR) 100 MG tablet Take 1 tablet (100 mg total) by mouth daily. 90 tablet 3  . metoprolol tartrate (LOPRESSOR) 25 MG tablet Take 1 tablet (25 mg total) by mouth 2 (two) times daily. 180 tablet 3  . pravastatin (PRAVACHOL) 40 MG tablet TAKE ONE TABLET BY MOUTH ONCE DAILY 90 tablet 3  . sitaGLIPtin (JANUVIA) 50 MG tablet Take 1 tablet (50 mg total) by mouth daily. 30 tablet 12   No current facility-administered medications for this visit.     Physical Exam BP 136/75   Pulse (!) 51   Ht 5\' 10"  (1.778 m)   Wt 169 lb (76.7 kg)   SpO2 98%  BMI 24.43 kg/m  66 year old man in no acute distress Well-developed well-nourished Alert and oriented 3, hoarse voice, otherwise neurologically intact No cervical or supraclavicular adenopathy Lungs diminished at left base, otherwise clear Incisions clean dry and intact Cardiac regular rate and rhythm normal S1 and S2  Diagnostic Tests: CHEST  2 VIEW  COMPARISON:  PET-CT 02/25/2017 and earlier.  FINDINGS: Stable right chest power port. Stable lung volumes. Mediastinal contours are stable and within normal limits. No pneumothorax, pulmonary edema, pleural effusion or confluent pulmonary opacity. Stable right axillary surgical clips. Negative visible bowel gas pattern. No acute osseous abnormality identified.  IMPRESSION: Stable.  No acute cardiopulmonary abnormality.   Electronically Signed   By:  [name] [name] M.D.   On: 04/05/2017 08:13 NUCLEAR MEDICINE PET SKULL BASE TO THIGH  TECHNIQUE: 8.4 mCi F-18 FDG was injected intravenously. Full-ring PET imaging was performed from the skull base to thigh after the radiotracer. CT data was obtained and used for attenuation correction and anatomic localization.  FASTING BLOOD GLUCOSE:  Value: 123 mg/dl  COMPARISON:  Multiple exams, including 08/30/2016  FINDINGS: NECK  No hypermetabolic lymph nodes in the neck. Slight asymmetry of palatine tonsillar activity but probably physiologic/incidental.  CHEST  No hypermetabolic mediastinal or hilar nodes. No suspicious pulmonary nodules on the CT data.  Right sided Port-A-Cath tip: SVC. Right axillary clips noted. Atherosclerotic calcification of the aortic arch and branch vessels.  Centrilobular emphysema. Subsegmental atelectasis along the right hemidiaphragm. Stable postoperative findings in the left lung.  ABDOMEN/PELVIS  No abnormal hypermetabolic activity within the liver, pancreas, adrenal glands, or spleen. No hypermetabolic lymph nodes in the abdomen or pelvis.  Aortoiliac atherosclerotic vascular disease.  SKELETON  No focal hypermetabolic activity to suggest skeletal metastasis. Late healing response of left-sided ribs as noted previously. Incidental lipoma of the left external oblique muscle, image 137/4, no accentuated metabolic activity.  IMPRESSION: 1. No abnormal nodal enlargement or hypermetabolic activity to suggest recurrent lymphoma. Postoperative findings in the left chest without findings of recurrent left lung cancer. 2. Aortic Atherosclerosis (ICD10-I70.0) and Emphysema (ICD10-J43.9).   Electronically Signed   By: [name] [name] M.D.   On: 02/25/2017 09:16 I personally reviewed the chest x-ray from today as well as the PET CT from August. I concur with the findings noted above.  [name] is a 66 year old gentleman  who had a thoracoscopic left upper lobectomy for stage IA adenocarcinoma a year ago. He has no evidence of recurrent disease. He will continue to be followed by Dr. Alvy [name] with a lung cancer and his large cell B-cell lymphoma.  Since he will be seeing Dr. Alvy [name] a regular basis for follow-up, he does not need to continue to come back and see me. If any questions arise with which I can be of assistance,  I will be happy to see him.  Persistent hoarseness post lobectomy- I offered to refer him for ENT evaluation. His hoarseness does not bother him and he refused that offer.  Plan: Follow-up with Dr. Alvy [name] as planned  I will be happy to to see him back any time if I can be of any further assistance.  Melrose Nakayama, MD [facility] [phone]

## 2017-04-07 ENCOUNTER — Telehealth: Payer: Self-pay

## 2017-04-07 NOTE — Telephone Encounter (Signed)
Called office and faxed over order for port removal. Dr. Darrel Hoover patient. States that they will call patient.

## 2017-04-29 ENCOUNTER — Ambulatory Visit (INDEPENDENT_AMBULATORY_CARE_PROVIDER_SITE_OTHER): Payer: Medicare Other | Admitting: Physician Assistant

## 2017-04-29 ENCOUNTER — Encounter: Payer: Self-pay | Admitting: Physician Assistant

## 2017-04-29 VITALS — BP 124/78 | HR 58 | Temp 98.2°F | Resp 18 | Ht 70.0 in | Wt 167.8 lb

## 2017-04-29 DIAGNOSIS — Z23 Encounter for immunization: Secondary | ICD-10-CM

## 2017-04-29 DIAGNOSIS — N183 Chronic kidney disease, stage 3 unspecified: Secondary | ICD-10-CM

## 2017-04-29 DIAGNOSIS — E1122 Type 2 diabetes mellitus with diabetic chronic kidney disease: Secondary | ICD-10-CM | POA: Diagnosis not present

## 2017-04-29 DIAGNOSIS — I10 Essential (primary) hypertension: Secondary | ICD-10-CM

## 2017-04-29 DIAGNOSIS — E785 Hyperlipidemia, unspecified: Secondary | ICD-10-CM | POA: Diagnosis not present

## 2017-04-29 DIAGNOSIS — I129 Hypertensive chronic kidney disease with stage 1 through stage 4 chronic kidney disease, or unspecified chronic kidney disease: Secondary | ICD-10-CM | POA: Insufficient documentation

## 2017-04-29 NOTE — Assessment & Plan Note (Signed)
Controlled. Continue current treatment. 

## 2017-04-29 NOTE — Assessment & Plan Note (Signed)
Await labs. Adjust regimen as indicated by results. COntinue monitoring and follow-up per nephrology.

## 2017-04-29 NOTE — Assessment & Plan Note (Addendum)
Has been well controlled, with LDL <70 . Await labs. Adjust regimen as indicated by results.

## 2017-04-29 NOTE — Progress Notes (Signed)
Subjective:    [name], male    DOB: 07/05/1951, 66 y.o.   MRN: [id number]  HPI  [name] [name] is a 66 year old African American male with a past medical history significant for type 2 diabetes mellitus, chronic kidney disease, hyperlipidemia, hypertension, and COPD who presents today for diabetes, hypertension, and hyperlipidemia follow-up.   [name] [name] reports everything is going well so far. [name] [name] states he is compliant with all of his medications and is tolerating them well without adverse effects.   [name] [name] states he is not checking his blood pressure at home. He has a blood pressure cuff at home, but does not use it. He states he would be willing to check it but feels like he doesn't need to. [name] [name] denies headaches, changes to his vision. [name] [name] states his last optometry appointment was a while ago. He denies chest pain, dyspnea, orthopnea, PND, or peripheral edema. [name] [name] [name] denies changes in urinary frequency and hematuria.   [name] reports he is checking his blood glucose levels at home. He states he checks the blood glucose levels every morning and they range from 70-120. [name] [name] denies dizziness. He [name] denies changes in sensation to his hands and feet. [name] [name] states he performs feet checks once per week.   [name] [name] states he eats what he wants to eat. He reports he does not eat well-balanced meals. [name] [name] reports he will snack every now and then, but not very often. [name] [name] reports he will eat burgers, chicken, beef, fish every now and then, vegetables if they come with the meat. He [name] states he will eat salads for some meals.  [name] [name] walks 1.5-2 miles when the weather is nice. [name] [name] denies alcohol use. He [name] denies the use of tobacco products or illicit drugs.   Medications:  Prior to Admission medications   Medication Sig Start Date End Date Taking? Authorizing Provider  amLODipine (NORVASC) 10 MG tablet TAKE  ONE TABLET BY MOUTH ONCE DAILY 10/26/16  Yes Jeffery, Chelle, PA-C  aspirin 81 MG tablet Take 81 mg by mouth daily.   Yes [provider]  fish oil-omega-3 fatty acids 1000 MG capsule Take 1 g by mouth daily.   Yes [provider]  glipiZIDE (GLUCOTROL XL) 10 MG 24 hr tablet TAKE ONE TABLET BY MOUTH ONCE DAILY 01/25/17  Yes Jeffery, Chelle, PA-C  glucose blood (GLUCOSE METER TEST) test strip Use as instructed 11/30/16  Yes Jeffery, Chelle, PA-C  hydrochlorothiazide (HYDRODIURIL) 25 MG tablet Take 1 tablet (25 mg total) by mouth daily. 10/26/16  Yes Jeffery, Chelle, PA-C  Insulin Glargine (LANTUS) 100 UNIT/ML Solostar Pen Inject 10 Units into the skin daily at 10 pm. 11/05/16  Yes Jacqulynn Cadet, Chelle, PA-C  Insulin Pen Needle 32G X 4 MM MISC Use to check home glucose as directed 11/30/16  Yes Jeffery, Chelle, PA-C  Lancets (ACCU-CHEK SOFT TOUCH) lancets Use as instructed 11/08/16  Yes Jeffery, Chelle, PA-C  losartan (COZAAR) 100 MG tablet Take 1 tablet (100 mg total) by mouth daily. 10/26/16  Yes Jeffery, Chelle, PA-C  metoprolol tartrate (LOPRESSOR) 25 MG tablet Take 1 tablet (25 mg total) by mouth 2 (two) times daily. 01/25/17  Yes Jeffery, Chelle, PA-C  pravastatin (PRAVACHOL) 40 MG tablet TAKE ONE TABLET BY MOUTH ONCE DAILY 10/26/16  Yes Jeffery, Chelle, PA-C  sitaGLIPtin (JANUVIA) 50 MG tablet Take 1 tablet (50 mg total) by mouth daily. 01/25/17  Yes  Harrison Mons, PA-C   Allergies: Allergies  Allergen Reactions  . Ace Inhibitors Hives  . Rituximab Other (See Comments)    This medication caused him to sweat & shake. Patient is able to tolerate non-RIR rituximab    Chronic Medical Conditions:  Patient Active Problem List   Diagnosis Date Noted  . COPD (chronic obstructive pulmonary disease) with emphysema ([place]) 10/26/2016  . Incisional pain 07/02/2016  . Adenocarcinoma of lung, stage 1, left ([place]) 04/15/2016  . Essential hypertension, benign 03/02/2016  . Port catheter in place  10/31/2015  . Nodule of left lung 09/11/2015  . Restrictive lung disease 04/23/2015  . Reactive thrombocytosis 01/09/2015  . Elevated liver enzymes 01/09/2015  . Leukocytosis 12/19/2014  . Thrombocytopenia due to drugs 12/11/2014  . Diffuse large B-cell lymphoma of lymph nodes of multiple regions ([place]) 11/04/2014  . Anemia in neoplastic disease 11/04/2014  . Hyperlipidemia 05/11/2013  . DM (diabetes mellitus), type 2 with renal complications ([place])   . Chronic kidney disease, stage III (moderate) (HCC)   . History of tobacco abuse   . Erectile dysfunction    Review of Systems     Objective:   Physical Exam  Constitutional: He appears well-developed and well-nourished. He is cooperative.  BP 124/78 (BP Location: Left Arm, Patient Position: Sitting, Cuff Size: Normal)   Pulse (!) 58   Temp 98.2 F (36.8 C) (Oral)   Resp 18   Ht 5\' 10"  (1.778 m)   Wt 167 lb 12.8 oz (76.1 kg)   SpO2 97%   BMI 24.08 kg/m    HENT:  Head: Normocephalic and atraumatic.  Eyes:    Neck: Normal range of motion. Neck supple. No thyromegaly present.  Cardiovascular: Normal rate, regular rhythm, S1 normal, S2 normal and normal heart sounds.   No murmur heard. Pulses:      Radial pulses are 2+ on the right side, and 1+ on the left side.       Dorsalis pedis pulses are 2+ on the right side, and 1+ on the left side.       Posterior tibial pulses are 1+ on the right side, and 1+ on the left side.  Pulmonary/Chest: Effort normal and breath sounds normal. He has no wheezes.  Lymphadenopathy:    He has no cervical adenopathy.  Neurological: He is alert. No sensory deficit.  Skin: Skin is warm and dry.      Assessment & Plan:  1. Type 2 diabetes mellitus with stage 3 chronic kidney disease, without long-term current use of insulin (HCC) - Hemoglobin A1c obtained today in clinic  - Continue Glipizide 10mg  daily - Continue Lantus 10 units daily at 10pm - Continue Januvia 50mg  daily - Encouraged  patient to attempt to eat well-balanced meals that include fruits and vegetables, whole grains, lean proteins, and low sodium foods. [name] encouraged patient to get at least 30 minutes of exercise 5 times per week.  - [name] encouraged patient to continue to check blood glucose levels at home.  - Return to clinic in 3 months for diabetes follow-up.  2. Essential hypertension, benign - Continue Amlodipine 10mg  daily  - Continue Hydrochlorothiazide 25mg  daily  - Continue Losartan 100mg  daily - Continue Metoprolol tartrate 25mg  daily  - Encouraged patient to attempt to eat well-balanced meals that include fruits and vegetables, whole grains, lean proteins, and low sodium foods. [name] encouraged patient to get at least 30 minutes of exercise 5 times per week.  - [name] encouraged patient to check blood  pressure at home.  - Return to clinic in 3 months for hypertension follow-up.  3. Hyperlipidemia, unspecified hyperlipidemia type - Lipid panel obtained today in clinic - Continue Pravastatin 40mg  daily - Encouraged patient to attempt to eat well-balanced meals that include fruits and vegetables, whole grains, lean proteins, and low sodium foods. [name] encouraged patient to get at least 30 minutes of exercise 5 times per week.  - Return to clinic in 3 months for hyperlipidemia follow-up.  4. Health Maintenance - Flu Vaccine QUAD 36+ mos IM administered today in clinic  Cinde Ebert, PA-S

## 2017-04-29 NOTE — Patient Instructions (Signed)
     IF you received an x-ray today, you will receive an invoice from [facility]. Please contact [facility] at [phone] with questions or concerns regarding your invoice.   IF you received labwork today, you will receive an invoice from [facility]. Please contact [facility] at [phone] with questions or concerns regarding your invoice.   Our billing staff will not be able to assist you with questions regarding bills from these companies.  You will be contacted with the lab results as soon as they are available. The fastest way to get your results is to activate your My Chart account. Instructions are located on the last page of this paperwork. If you have not heard from us regarding the results in 2 weeks, please contact this office.     

## 2017-04-29 NOTE — Assessment & Plan Note (Signed)
Await labs. Adjust regimen as indicated by results.  

## 2017-04-29 NOTE — Progress Notes (Signed)
[name], male    DOB: Jan 20, 1951, 66 y.o.   MRN: [id number]  PCP: Harrison Mons, PA-C  Chief Complaint  Patient presents with  . Diabetes  . Hyperlipidemia  . Follow-up    Subjective:   Presents for evaluation of diabetes, HTN, hyperlipidemia.  He's been doing well. No specific concerns or complaints. He has completed treatment for stage 1 adenocarcinoma of the lung, and is scheduled to have the portacath removed on 10/29.  BP and glucose have been generally well controlled, with some intermittent elevated readings during treatments for lung cancer, and prior to that, treatments for B-cell lymphoma. Renal disease ahs been stable since glucose and BP controlled achieved.  He is tolerating his current medications well, without adverse effects.  Review of Systems Denies chest pain, shortness of breath, HA, dizziness, vision change, nausea, vomiting, diarrhea, constipation, melena, hematochezia, dysuria, increased urinary urgency or frequency, increased hunger or thirst, unintentional weight change, unexplained myalgias or arthralgias, rash.   Patient Active Problem List   Diagnosis Date Noted  . COPD (chronic obstructive pulmonary disease) with emphysema ([place]) 10/26/2016  . Incisional pain 07/02/2016  . Adenocarcinoma of lung, stage 1, left ([place]) 04/15/2016  . Essential hypertension, benign 03/02/2016  . Port catheter in place 10/31/2015  . Nodule of left lung 09/11/2015  . Restrictive lung disease 04/23/2015  . Reactive thrombocytosis 01/09/2015  . Elevated liver enzymes 01/09/2015  . Leukocytosis 12/19/2014  . Thrombocytopenia due to drugs 12/11/2014  . Diffuse large B-cell lymphoma of lymph nodes of multiple regions ([place]) 11/04/2014  . Anemia in neoplastic disease 11/04/2014  . Hyperlipidemia 05/11/2013  . DM (diabetes mellitus), type 2 with renal complications ([place])   . Chronic kidney disease, stage III (moderate) (HCC)   . History of tobacco  abuse   . Erectile dysfunction      Prior to Admission medications   Medication Sig Start Date End Date Taking? Authorizing Provider  amLODipine (NORVASC) 10 MG tablet TAKE ONE TABLET BY MOUTH ONCE DAILY 10/26/16  Yes Anicia Leuthold, PA-C  aspirin 81 MG tablet Take 81 mg by mouth daily.   Yes [provider]  fish oil-omega-3 fatty acids 1000 MG capsule Take 1 g by mouth daily.   Yes [provider]  glipiZIDE (GLUCOTROL XL) 10 MG 24 hr tablet TAKE ONE TABLET BY MOUTH ONCE DAILY 01/25/17  Yes Nocholas Damaso, PA-C  glucose blood (GLUCOSE METER TEST) test strip Use as instructed 11/30/16  Yes Luisana Lutzke, PA-C  hydrochlorothiazide (HYDRODIURIL) 25 MG tablet Take 1 tablet (25 mg total) by mouth daily. 10/26/16  Yes Eon Zunker, PA-C  Insulin Glargine (LANTUS) 100 UNIT/ML Solostar Pen Inject 10 Units into the skin daily at 10 pm. 11/05/16  Yes Jacqulynn Cadet, Jamesina Gaugh, PA-C  Insulin Pen Needle 32G X 4 MM MISC Use to check home glucose as directed 11/30/16  Yes Arlynn Mcdermid, PA-C  Lancets (ACCU-CHEK SOFT TOUCH) lancets Use as instructed 11/08/16  Yes Leldon Steege, PA-C  losartan (COZAAR) 100 MG tablet Take 1 tablet (100 mg total) by mouth daily. 10/26/16  Yes Trysten Bernard, PA-C  metoprolol tartrate (LOPRESSOR) 25 MG tablet Take 1 tablet (25 mg total) by mouth 2 (two) times daily. 01/25/17  Yes Anias Bartol, PA-C  pravastatin (PRAVACHOL) 40 MG tablet TAKE ONE TABLET BY MOUTH ONCE DAILY 10/26/16  Yes Kareem Aul, PA-C  sitaGLIPtin (JANUVIA) 50 MG tablet Take 1 tablet (50 mg total) by mouth daily. 01/25/17  Yes Harrison Mons, PA-C  Allergies  Allergen Reactions  . Ace Inhibitors Hives  . Rituximab Other (See Comments)    This medication caused him to sweat & shake. Patient is able to tolerate non-RIR rituximab        Objective:  Physical Exam  Constitutional: He is oriented to person, place, and time. He appears well-developed and well-nourished. He is active and  cooperative. No distress.  BP 124/78 (BP Location: Left Arm, Patient Position: Sitting, Cuff Size: Normal)   Pulse (!) 58   Temp 98.2 F (36.8 C) (Oral)   Resp 18   Ht 5\' 10"  (1.778 m)   Wt 167 lb 12.8 oz (76.1 kg)   SpO2 97%   BMI 24.08 kg/m   HENT:  Head: Normocephalic and atraumatic.  Right Ear: Hearing normal.  Left Ear: Hearing normal.  Eyes: Conjunctivae are normal. No scleral icterus.  Neck: Normal range of motion. Neck supple. No thyromegaly present.  Cardiovascular: Normal rate, regular rhythm and normal heart sounds.   Pulses:      Radial pulses are 2+ on the right side, and 2+ on the left side.  Pulmonary/Chest: Effort normal and breath sounds normal.  Lymphadenopathy:       Head (right side): No tonsillar, no preauricular, no posterior auricular and no occipital adenopathy present.       Head (left side): No tonsillar, no preauricular, no posterior auricular and no occipital adenopathy present.    He has no cervical adenopathy.       Right: No supraclavicular adenopathy present.       Left: No supraclavicular adenopathy present.  Neurological: He is alert and oriented to person, place, and time. No sensory deficit.  Skin: Skin is warm, dry and intact. No rash noted. No cyanosis or erythema. Nails show no clubbing.  Psychiatric: He has a normal mood and affect. His speech is normal and behavior is normal.           Assessment & Plan:   Problem List Items Addressed This Visit    DM (diabetes mellitus), type 2 with renal complications ([place]) - Primary (Chronic)    Await labs. Adjust regimen as indicated by results.       Relevant Orders   Hemoglobin A1c   HM DIABETES EYE EXAM (Completed)   Chronic kidney disease, stage III (moderate) (HCC) (Chronic)    Await labs. Adjust regimen as indicated by results. COntinue monitoring and follow-up per nephrology.      Hyperlipidemia    Has been well controlled, with LDL <70 . Await labs. Adjust regimen as indicated  by results.      Relevant Orders   Lipid panel   Essential hypertension, benign    Controlled. Continue current treatment.       Other Visit Diagnoses    Need for influenza vaccination       Relevant Orders   Flu Vaccine QUAD 36+ mos IM (Completed)       Return in about 3 months (around 07/30/2017) for re-evalaution of diabetes, blood pressure and cholesterol.   Fara Chute, PA-C [facility]

## 2017-04-30 LAB — LIPID PANEL
CHOL/HDL RATIO: 4 ratio (ref 0.0–5.0)
Cholesterol, Total: 125 mg/dL (ref 100–199)
HDL: 31 mg/dL — ABNORMAL LOW (ref >39)
LDL CALC: 70 mg/dL (ref 0–99)
TRIGLYCERIDES: 118 mg/dL (ref 0–149)
VLDL Cholesterol Cal: 24 mg/dL (ref 5–40)

## 2017-04-30 LAB — HEMOGLOBIN A1C
ESTIMATED AVERAGE GLUCOSE: 137 mg/dL
HEMOGLOBIN A1C: 6.4 % — AB (ref 4.8–5.6)

## 2017-05-10 ENCOUNTER — Encounter: Payer: Self-pay | Admitting: Physician Assistant

## 2017-05-22 IMAGING — CR DG CHEST 2V
2 series · 2 of 2 positions shown · non-contrast
Comparison: CT scan of the chest March 23, 2016

CLINICAL DATA: Preoperative examination prior to lung surgery.

EXAM:
CHEST  2 VIEW

[w chest pa]
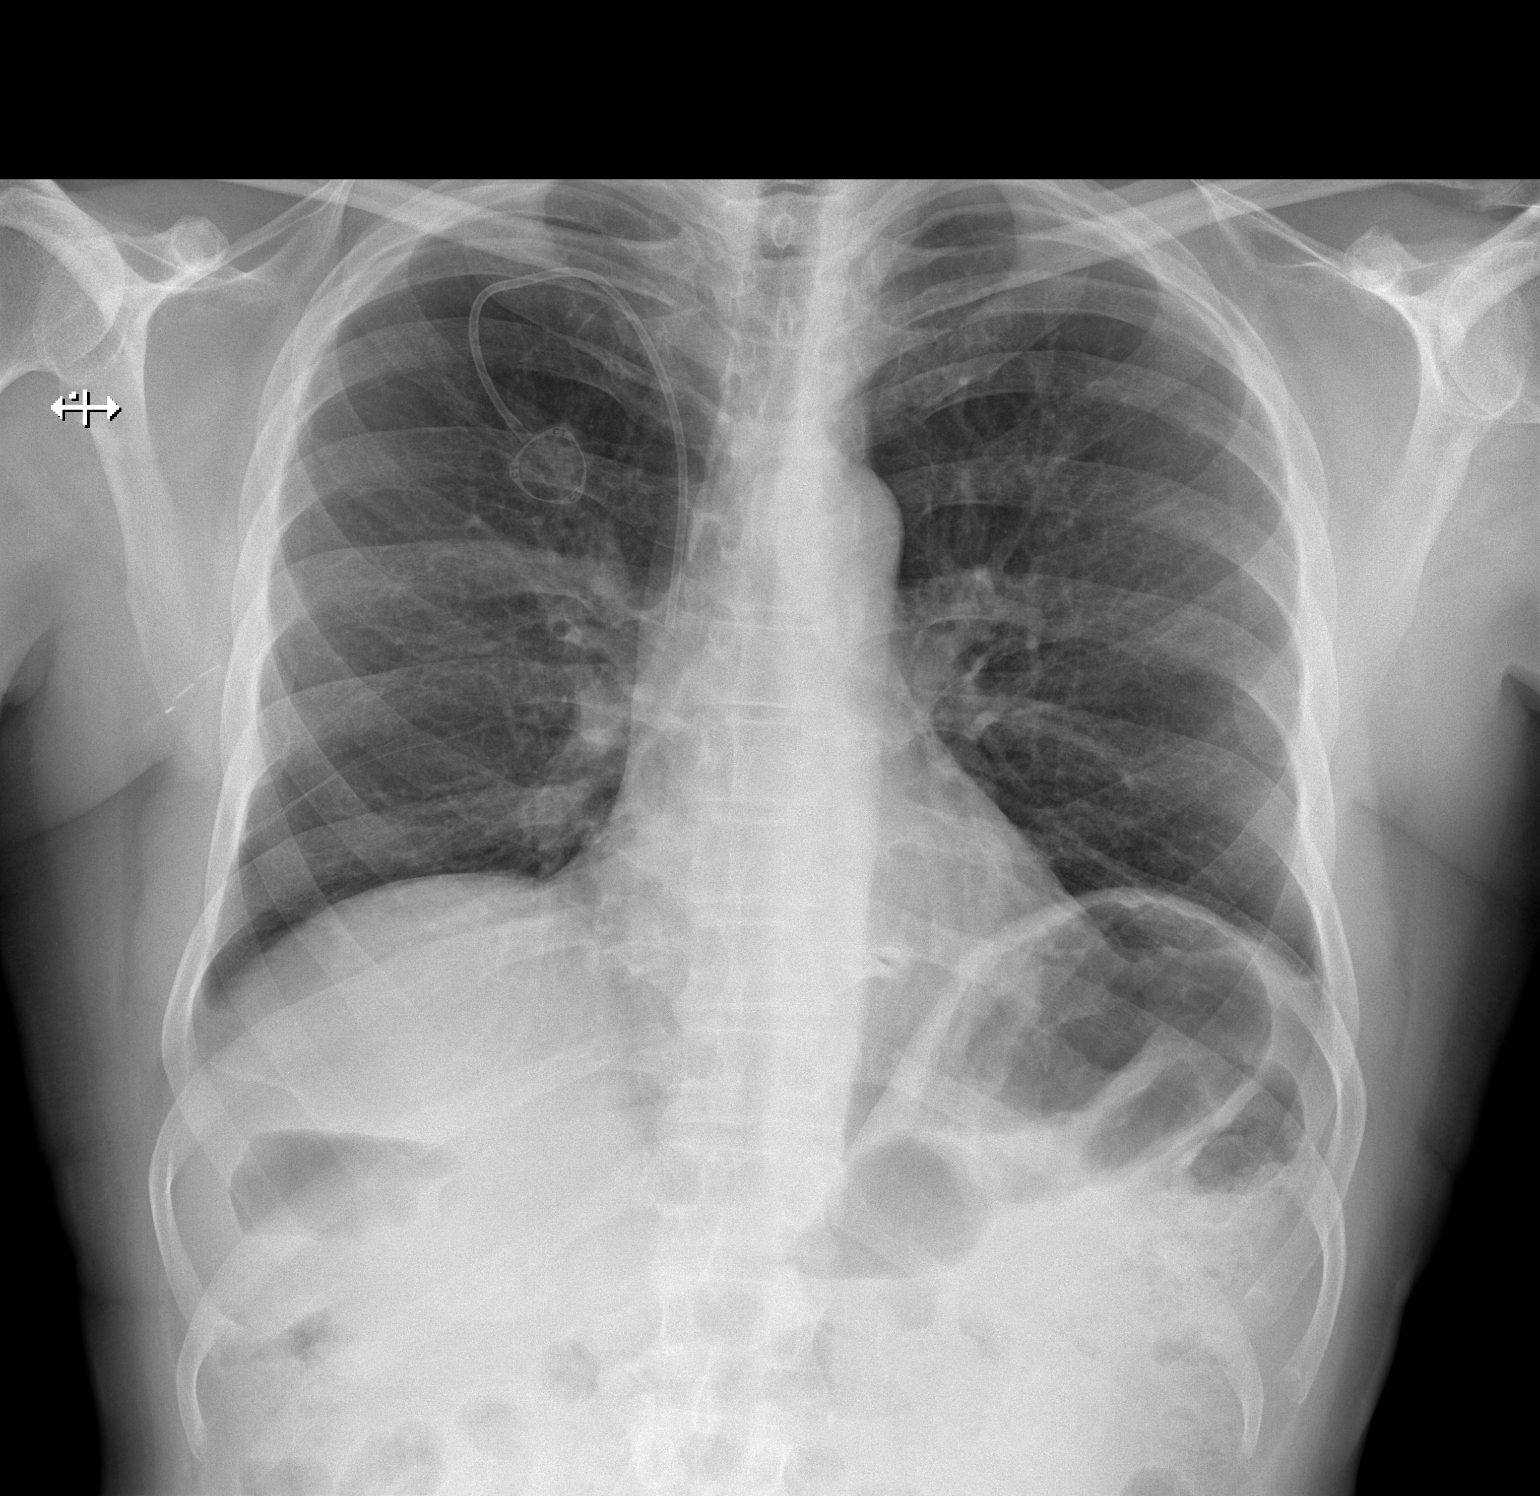

[w chest lat]
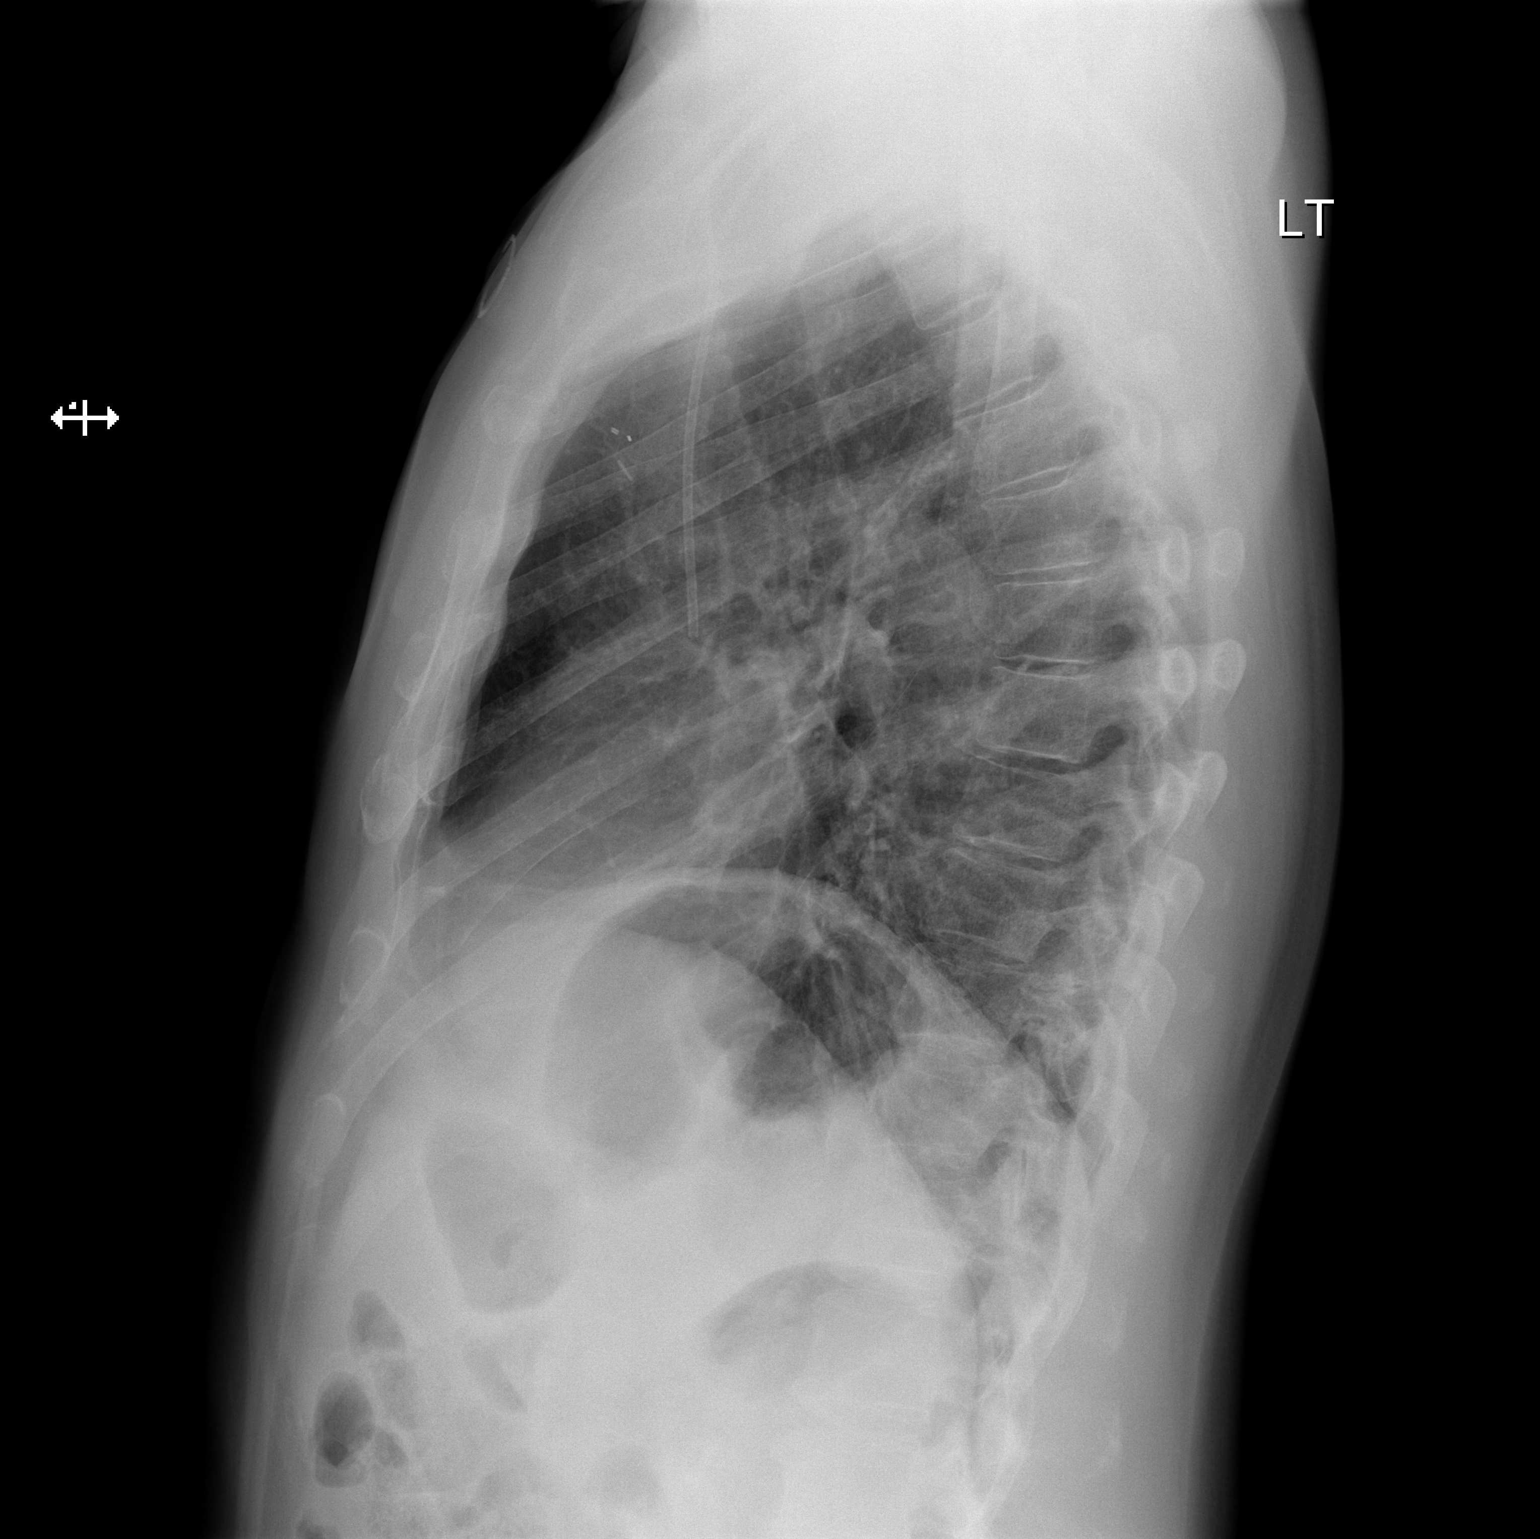

[2 of 2 positions shown; findings below may reference images not displayed]

FINDINGS: The lungs are well-expanded. The left upper lobe pulmonary nodule
noted on previous PET-CT study is not evident on today's plain
radiograph. There is no alveolar infiltrate or pleural effusion. The
heart and pulmonary vascularity are normal. The Port-A-Cath
appliance tip projects over the midportion of the SVC. The bony
thorax is unremarkable.
IMPRESSION: There is no active cardiopulmonary disease. The patient has a known
abnormal left upper lobe nodule not visible on this study.

## 2017-05-24 IMAGING — DX DG CHEST 1V PORT
1 series · 1 of 1 positions shown · non-contrast
Comparison: 04/12/2016.  Chest CT 03/23/2016

CLINICAL DATA: Thoracotomy

EXAM:
PORTABLE CHEST 1 VIEW

[chest ap]
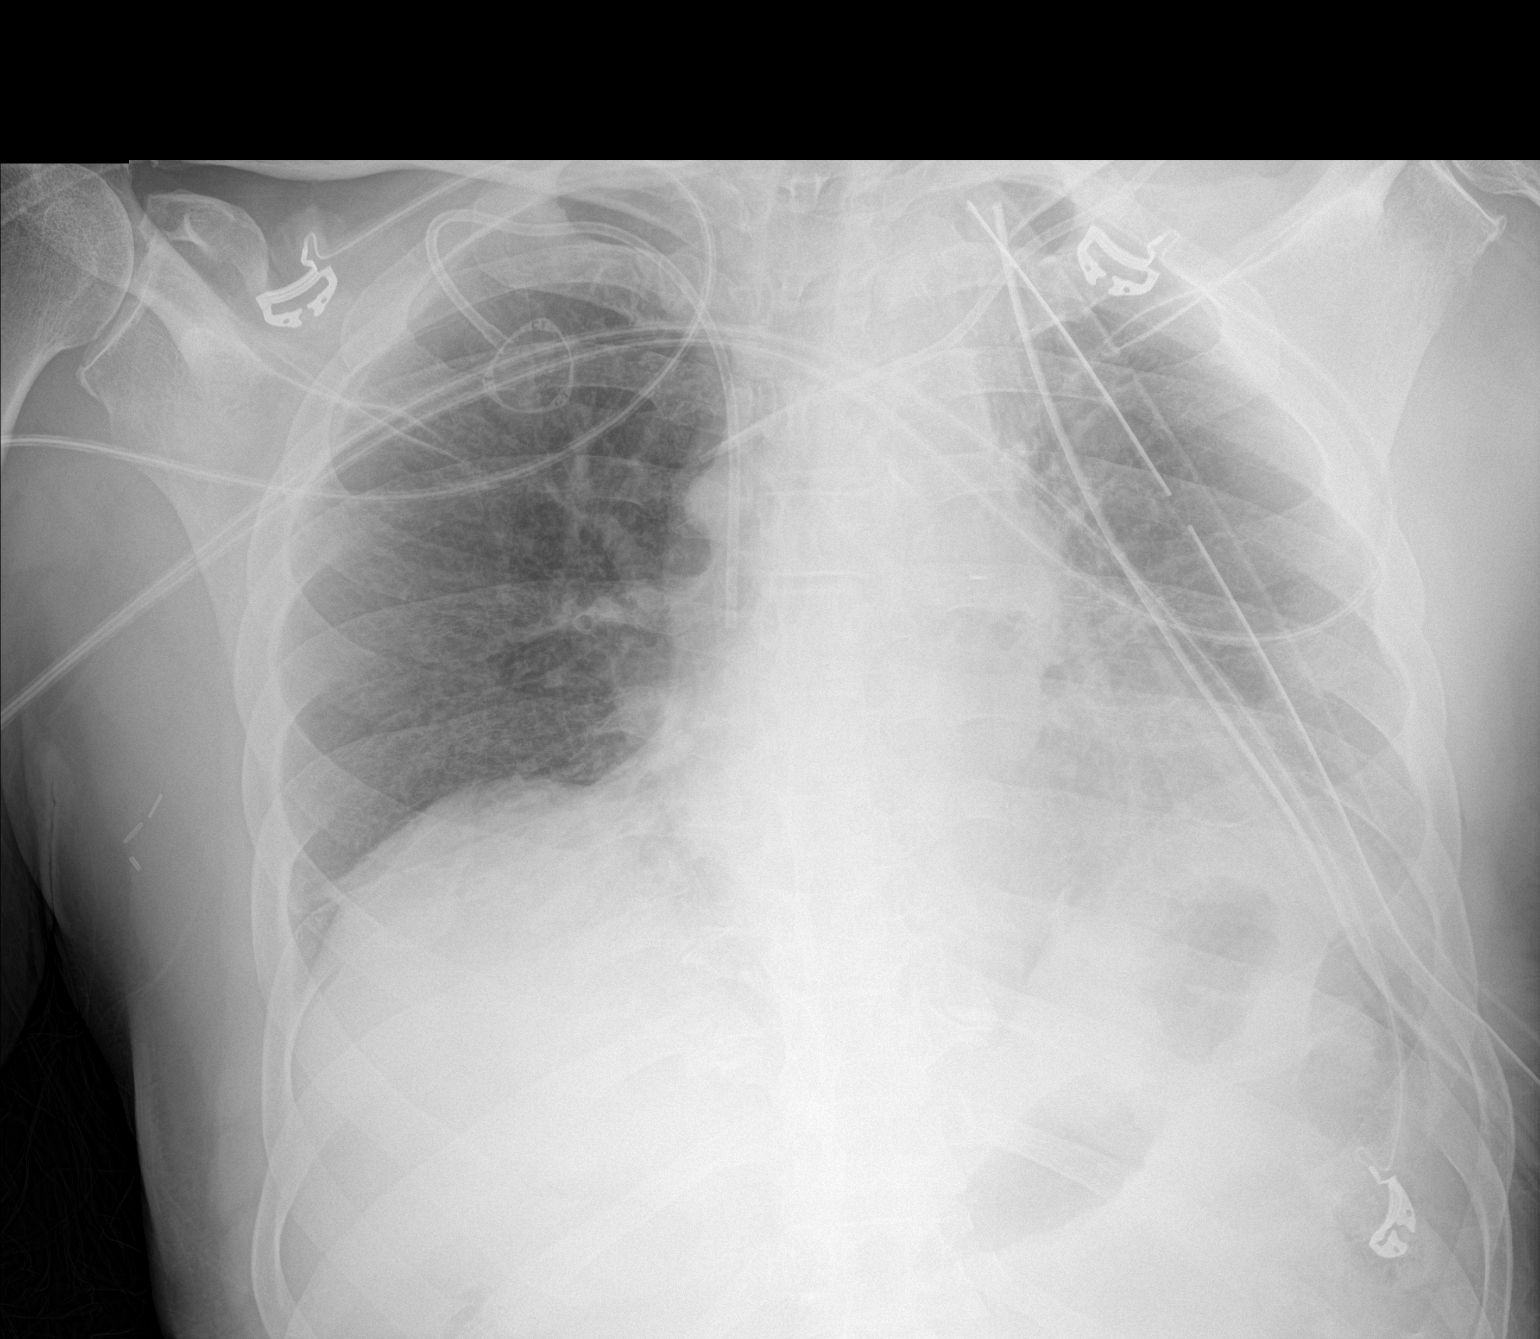

[1 of 1 positions shown; findings below may reference images not displayed]

FINDINGS: Two chest tubes on the left. No pneumothorax. No significant pleural
effusion on the left. Left lower lobe atelectasis.

Left subclavian central venous catheter tip in the proximal SVC.
Port-A-Cath tip in the SVC unchanged.

Right lower lobe atelectasis.  Negative for heart failure.
IMPRESSION: Postop changes on the left.  No pneumothorax.

Bibasilar atelectasis.

Central venous catheter tip in the proximal SVC.

## 2017-05-25 IMAGING — CR DG CHEST 1V PORT
1 series · 1 of 1 positions shown · non-contrast
Comparison: Portable chest x-ray April 14, 2016

CLINICAL DATA: Follow-up postsurgical changes on the left,chest
tube treatment.

EXAM:
PORTABLE CHEST 1 VIEW

[AP]
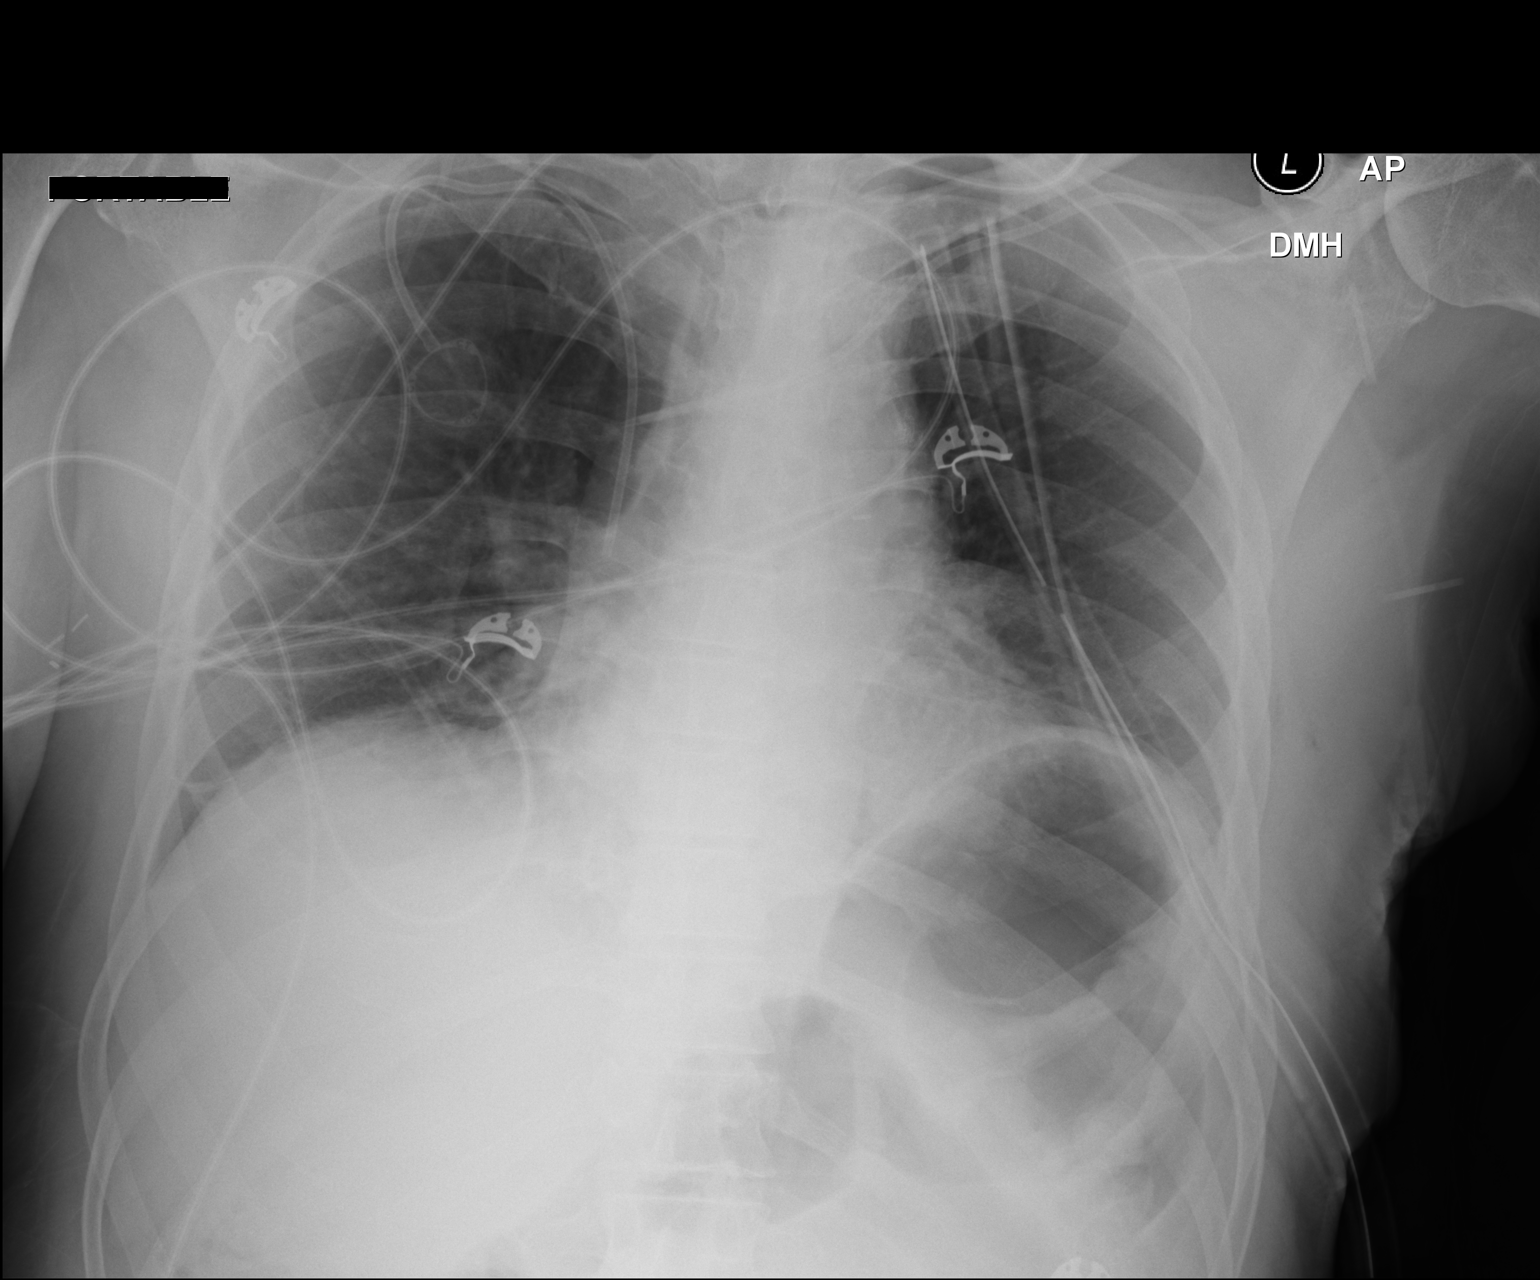

[1 of 1 positions shown; findings below may reference images not displayed]

FINDINGS: The lungs arm borderline hypoinflated. There is no pneumothorax or
pleural effusion. Minimal subsegmental atelectasis at the lung bases
is present. The heart and pulmonary vascularity are normal. There is
calcification in the wall of the aortic arch. There are 2 left-sided
apical chest tubes which appears stable. The Port-A-Cath appliance
tip projects over the midportion of the SVC. The left subclavian
venous catheter tip projects over the proximal SVC. The bowel gas
pattern in the upper abdomen is unremarkable.
IMPRESSION: No pneumothorax on the left. Minimal bibasilar subsegmental
atelectasis which appears stable. The support tubes are in
reasonable position.

## 2017-06-04 IMAGING — DX DG CHEST 2V
2 series · 2 of 2 positions shown · non-contrast
Comparison: 04/24/2016

CLINICAL DATA: Status post chest tube removal

EXAM:
CHEST  2 VIEW

[chest pa]
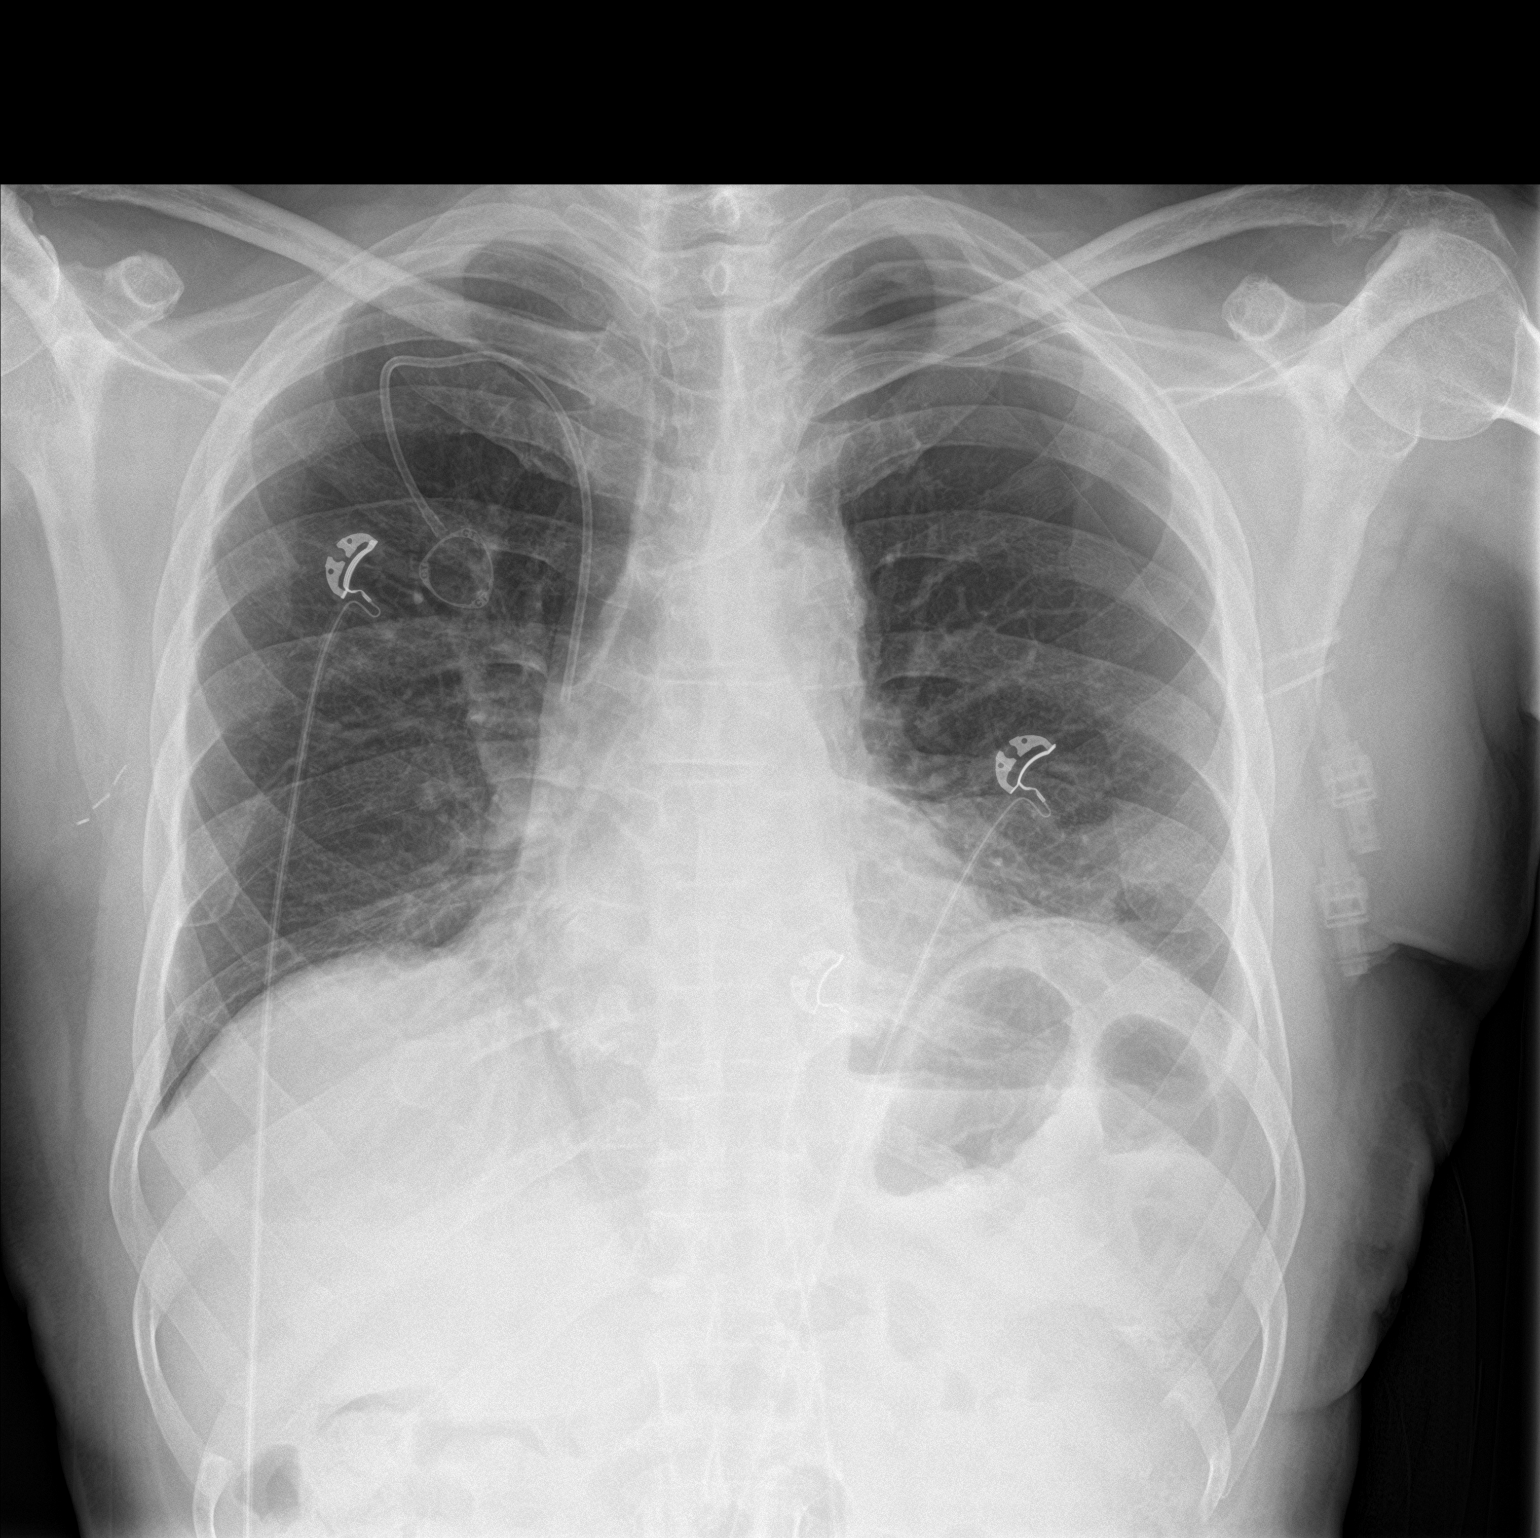

[chest lat]
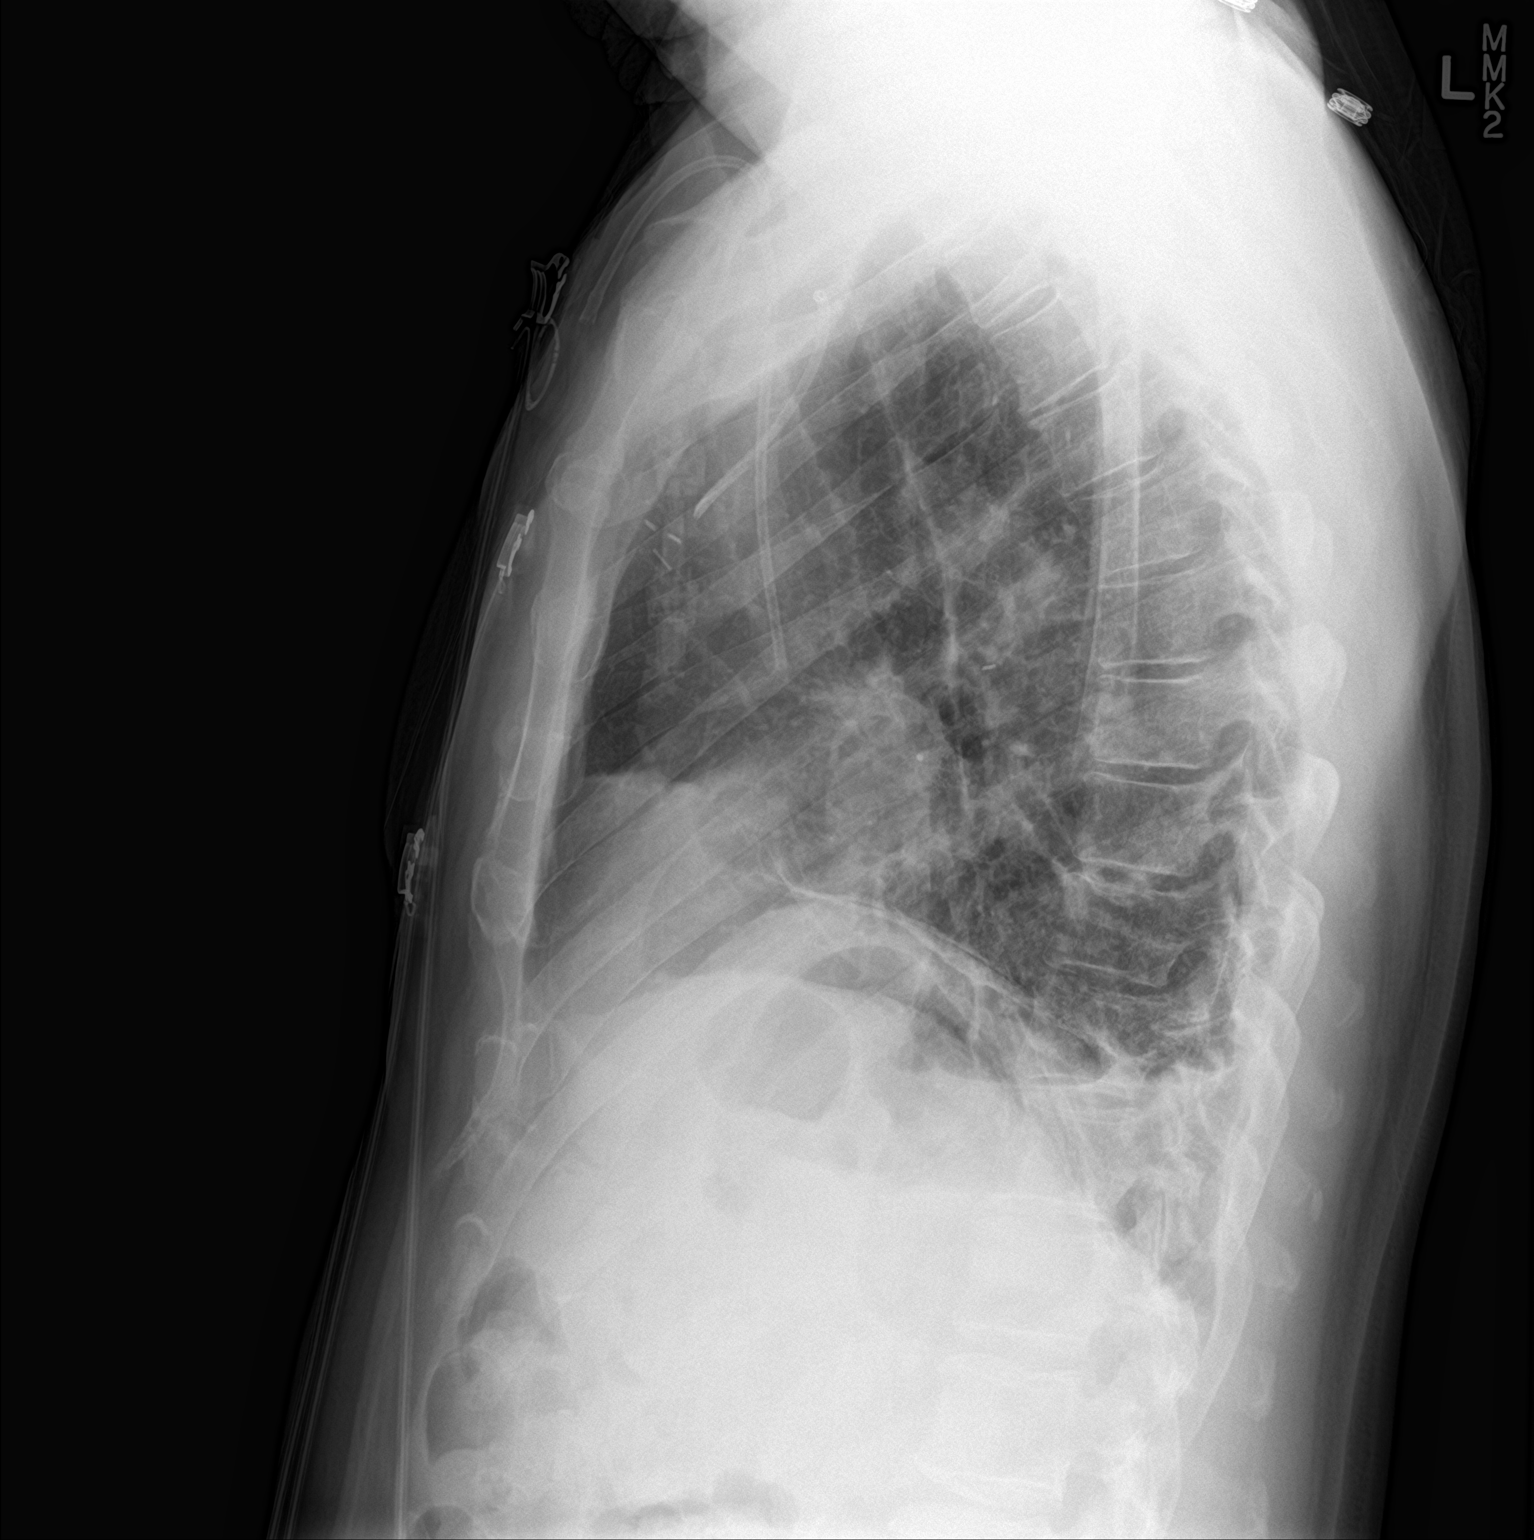

[2 of 2 positions shown; findings below may reference images not displayed]

FINDINGS: Right chest wall port is again seen. The previously noted left chest
tube has been removed in the interval. Left central venous line is
again seen and stable. The lungs are well aerated bilaterally. There
is evidence of recurrent pneumothorax with approximately 3 cm
excursion at the apex. No focal infiltrate is noted.
IMPRESSION: Recurrent left pneumothorax following chest tube removal.

These results will be called to the ordering clinician or
representative by the Radiologist Assistant, and communication
documented in the PACS or zVision Dashboard.

## 2017-06-17 ENCOUNTER — Ambulatory Visit (HOSPITAL_BASED_OUTPATIENT_CLINIC_OR_DEPARTMENT_OTHER): Payer: Medicare Other | Admitting: Medical

## 2017-06-17 ENCOUNTER — Ambulatory Visit (HOSPITAL_BASED_OUTPATIENT_CLINIC_OR_DEPARTMENT_OTHER): Payer: Medicare Other

## 2017-06-17 ENCOUNTER — Other Ambulatory Visit: Payer: Self-pay | Admitting: *Deleted

## 2017-06-17 ENCOUNTER — Telehealth: Payer: Self-pay | Admitting: *Deleted

## 2017-06-17 VITALS — BP 150/70 | HR 57 | Temp 98.4°F | Resp 18 | Ht 70.0 in | Wt 167.8 lb

## 2017-06-17 DIAGNOSIS — R140 Abdominal distension (gaseous): Secondary | ICD-10-CM

## 2017-06-17 DIAGNOSIS — C8338 Diffuse large B-cell lymphoma, lymph nodes of multiple sites: Secondary | ICD-10-CM

## 2017-06-17 DIAGNOSIS — C3412 Malignant neoplasm of upper lobe, left bronchus or lung: Secondary | ICD-10-CM | POA: Diagnosis not present

## 2017-06-17 DIAGNOSIS — C3492 Malignant neoplasm of unspecified part of left bronchus or lung: Secondary | ICD-10-CM

## 2017-06-17 DIAGNOSIS — R600 Localized edema: Secondary | ICD-10-CM

## 2017-06-17 LAB — CBC WITH DIFFERENTIAL/PLATELET
BASO%: 0.6 % (ref 0.0–2.0)
Basophils Absolute: 0.1 10*3/uL (ref 0.0–0.1)
EOS%: 0.6 % (ref 0.0–7.0)
Eosinophils Absolute: 0.1 10*3/uL (ref 0.0–0.5)
HCT: 44.7 % (ref 38.4–49.9)
HGB: 14.7 g/dL (ref 13.0–17.1)
LYMPH%: 9.4 % — AB (ref 14.0–49.0)
MCH: 27.9 pg (ref 27.2–33.4)
MCHC: 32.8 g/dL (ref 32.0–36.0)
MCV: 85 fL (ref 79.3–98.0)
MONO#: 0.6 10*3/uL (ref 0.1–0.9)
MONO%: 4.6 % (ref 0.0–14.0)
NEUT%: 84.8 % — ABNORMAL HIGH (ref 39.0–75.0)
NEUTROS ABS: 10.6 10*3/uL — AB (ref 1.5–6.5)
PLATELETS: 225 10*3/uL (ref 140–400)
RBC: 5.26 10*6/uL (ref 4.20–5.82)
RDW: 13.4 % (ref 11.0–14.6)
WBC: 12.5 10*3/uL — AB (ref 4.0–10.3)
lymph#: 1.2 10*3/uL (ref 0.9–3.3)

## 2017-06-17 LAB — COMPREHENSIVE METABOLIC PANEL
ALT: 31 U/L (ref 0–55)
AST: 25 U/L (ref 5–34)
Albumin: 4.1 g/dL (ref 3.5–5.0)
Alkaline Phosphatase: 76 U/L (ref 40–150)
Anion Gap: 11 mEq/L (ref 3–11)
BILIRUBIN TOTAL: 0.68 mg/dL (ref 0.20–1.20)
BUN: 30.5 mg/dL — AB (ref 7.0–26.0)
CO2: 25 meq/L (ref 22–29)
CREATININE: 2.2 mg/dL — AB (ref 0.7–1.3)
Calcium: 9.3 mg/dL (ref 8.4–10.4)
Chloride: 105 mEq/L (ref 98–109)
EGFR: 34 mL/min/{1.73_m2} — ABNORMAL LOW (ref >60)
GLUCOSE: 144 mg/dL — AB (ref 70–140)
Potassium: 4.1 mEq/L (ref 3.5–5.1)
SODIUM: 141 meq/L (ref 136–145)
TOTAL PROTEIN: 7.1 g/dL (ref 6.4–8.3)

## 2017-06-17 NOTE — Progress Notes (Signed)
[facility] Progress Note   [name] [name] [id number] 02-08-1951 66 y.o.  [name] [name] is managed by Dr. Heath Lark  Actively treated with chemotherapy: no  Last Treated: 02/28/2017  Assessment: Plan:    Abdominal bloating - Plan: CT Abdomen Pelvis Wo Contrast, CT Chest Wo Contrast  Adenocarcinoma of lung, stage 1, left (HCC) - Plan: CT Chest Wo Contrast  Diffuse large B-cell lymphoma of lymph nodes of multiple regions (HCC) - Plan: CT Abdomen Pelvis Wo Contrast, CT Chest Wo Contrast  Abdominal distension (gaseous) - Plan: CT Abdomen Pelvis Wo Contrast   Abdominal bloating: The patient has been referred for a CT of the abdomen and pelvis.  He is also been instructed to begin over-the-counter Gas-X empirically.  Adenocarcinoma of the lung, stage I, left: The patient has been referred for a restaging CT scan of the chest.    Diffuse large B-cell lymphoma of lymph nodes of multiple regions: The patient has been referred for a restaging CT scan of the chest abdomen and pelvis given his new symptoms of abdominal swelling.    Please see After Visit Summary for patient specific instructions.  Future Appointments  Date Time Provider [place]  08/02/2017  9:00 AM Harrison Mons, PA-C [facility]CP-PCP [facility]  08/30/2017  8:00 AM [facility] None  08/30/2017  8:30 AM Heath Lark, MD [facility] None    Orders Placed This Encounter  Procedures  . CT Abdomen Pelvis Wo Contrast  . CT Chest Wo Contrast       Subjective:   Patient ID:  [name] [name] is a 66 y.o. (DOB Feb 04, 1951) male.  Chief Complaint:  Chief Complaint  Patient presents with  . 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  He was treated with 4 cycles of R CHOP from 11/29/2014 to 01/31/2015.  He received 2 cycles of prophylactic intrathecal  chemotherapy. A CT scan completed on 03/01/2016 returned showing an mild interval growth of a sub-solid left upper lobe pulmonary nodule which was hypermetabolic on a PET scan of 09/09/2015.  The patient had a wedge resection completed of the left upper lobe with pathology returning showing an adenocarcinoma.  PET scan completed on 02/25/2017 showed no abnormal nodal enlargement or hypermetabolic activity to suggest recurrent lymphoma.  Postoperative changes are noted in the left chest without findings of recurrent left lung cancer.  He was last treated with rituximab on he received his last dose of rituximab on 02/28/2017 and was to follow-up in 6 months.  Plans were to have repeat imaging studies done in 1 year.  Reported to the office today with a report of progressive swelling in his abdomen mainly over his right upper quadrant which had been worsening over the past 1 week and was causing some discomfort in his right side.  He denies fevers, chills, sweats, nausea, vomiting, constipation, diarrhea, changes in appetite, or unexplained weight loss.  Medications: I have reviewed the patient's current medications.  Allergies:  Allergies  Allergen Reactions  . Ace Inhibitors Hives  . Rituximab Other (See Comments)    This medication caused him to sweat & shake. Patient is able to tolerate non-RIR rituximab     Past Medical History:  Diagnosis Date  . Anemia   . Cancer ([place]) 2016   lymphoma  . Chronic kidney disease (CKD),  stage III (moderate) ([place])   . Chronic kidney disease, stage III (moderate) (HCC)   . Cramps, extremity    hx of in legs bilat; currently having issues in toes bilat   . Diabetes ([place])   . Erectile dysfunction   . Essential hypertension, benign   . History of ETOH abuse    quit 1999  . History of tobacco abuse    quit 12/2008  . Hyperparathyroidism ([place])    secondary / renal per H&P with [facility] 07/17/2014   . Pulmonary infiltrate present on computed tomography  02/20/2015  . Shortness of breath dyspnea    exertion  . Type II or unspecified type diabetes mellitus with renal manifestations, not stated as uncontrolled(250.40)     Past Surgical History:  Procedure Laterality Date  . ARM WOUND REPAIR / CLOSURE     chainsaw; left forearm/wrist  . AXILLARY LYMPH NODE BIOPSY Right 11/19/2014   Procedure: EXCISIONAL BIOPSY DEEP RIGHT AXILLARY LYMPH NODE ;  Surgeon: Fanny Skates, MD;  Location: [facility];  Service: General;  Laterality: Right;  . colonscopy     . PORTACATH PLACEMENT N/A 11/19/2014   Procedure: INSERTION PORT-A-CATH; rt chest Surgeon: Fanny Skates, MD;  Location: [facility];  Service: General;  Laterality: N/A;  . VIDEO ASSISTED THORACOSCOPY (VATS)/ LOBECTOMY Left 04/14/2016   Procedure: VIDEO ASSISTED THORACOSCOPY (VATS)/LEFT UPPER LOBECTOMY;  Surgeon: Melrose Nakayama, MD;  Location: [facility];  Service: Thoracic;  Laterality: Left;  Marland Kitchen VIDEO BRONCHOSCOPY WITH ENDOBRONCHIAL NAVIGATION Left 03/24/2016   Procedure: VIDEO BRONCHOSCOPY WITH ENDOBRONCHIAL NAVIGATION;  Surgeon: Melrose Nakayama, MD;  Location: [facility];  Service: Thoracic;  Laterality: Left;    Family History  Problem Relation Age of Onset  . Cancer Father        colon  . Cancer Sister        Breast  . Stroke Brother   . Stroke Brother   . Lung disease Neg Hx   . Rheumatologic disease Neg Hx     Social History   Socioeconomic History  . Marital status: Widowed    Spouse name: [name]  . Number of children: 2  . Years of education: 49  . Highest education level: Not on file  Social Needs  . Financial resource strain: Not on file  . Food insecurity - worry: Not on file  . Food insecurity - inability: Not on file  . Transportation needs - medical: Not on file  . Transportation needs - non-medical: Not on file  Occupational History  . Occupation: retired    Comment: Location manager  Tobacco Use  . Smoking status: Former Smoker    Packs/day: 1.00     Years: 50.00    Pack years: 50.00    Types: Cigarettes    Last attempt to quit: 07/05/2005    Years since quitting: 11.9  . Smokeless tobacco: Never Used  Substance and Sexual Activity  . Alcohol use: No    Alcohol/week: 0.0 oz    Comment: hx of ETOH use; quit drinking approx 2008   . Drug use: No  . Sexual activity: Yes    Partners: Female  Other Topics Concern  . Not on file  Social History Narrative   Lived with wife, [name], until her death in 07-22-15. His sister stays with him now.  [name] was diagnosed with stage III breast cancer 03/2014. She completed chemotherapy and had a mastectomy, and radiation therapy 09/2014. In 03/2015, her cancer recurred "everywhere."  [facility]:   He is originally from [facility]. He has always lived in [place]. 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 No known TB exposure. No known asbestos exposure. He has traveled to [place], [place], [place], & [place]. 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 No pets currently. Previously had cats and a dog. No bird exposure. No mold exposure. No hot tub exposure.    Past Medical History, Surgical history, Social history, and Family history were reviewed and updated as appropriate.   Please see review of systems for further details on the patient's review from today.   Review of Systems:  Review of Systems  Constitutional: Negative for appetite change, chills, diaphoresis, fever and unexpected weight change.  HENT: Negative for trouble swallowing.   Respiratory: Negative for cough, choking, chest tightness and shortness of breath.   Cardiovascular: Negative for chest pain and palpitations.  Gastrointestinal: Positive for abdominal distention and abdominal pain. Negative for constipation, diarrhea, nausea and vomiting.  Genitourinary: Negative for difficulty urinating.  Neurological: Negative for headaches.    Objective:   Physical Exam:    BP (!) 150/70 (BP Location: Left Arm, Patient Position: Sitting)   Pulse (!) 57   Temp 98.4 F (36.9 C) (Oral)   Resp 18   Ht 5\' 10"  (1.778 m)   Wt 167 lb 12.8 oz (76.1 kg)   SpO2 99%   BMI 24.08 kg/m   ECOG: 0  Physical Exam  Constitutional: No distress.  HENT:  Head: Normocephalic and atraumatic.  Mouth/Throat: Oropharynx is clear and moist. No oropharyngeal exudate.  Eyes: Right eye exhibits no discharge. Left eye exhibits no discharge. No scleral icterus.  Neck: Normal range of motion. Neck supple.  Cardiovascular: Normal rate, regular rhythm and normal heart sounds. Exam reveals no gallop and no friction rub.  No murmur heard. Pulmonary/Chest: Effort normal and breath sounds normal. No respiratory distress. He has no wheezes. He has no rales.    Abdominal: Soft. Bowel sounds are normal. He exhibits no distension and no mass. There is no tenderness. There is no rebound and no guarding.  Musculoskeletal: He exhibits no edema.  Lymphadenopathy:    He has no cervical adenopathy.  Neurological: He is alert. Coordination normal.  Skin: Skin is warm and dry. No rash noted. He is not diaphoretic. No erythema.  Psychiatric: He has a normal mood and affect. His behavior is normal. Thought content normal.    Lab Review:     Component Value Date/Time   NA 141 06/17/2017 0938   K 4.1 06/17/2017 0938   CL 109 04/23/2016 0345   CO2 25 06/17/2017 0938   GLUCOSE 144 (H) 06/17/2017 0938   BUN 30.5 (H) 06/17/2017 0938   CREATININE 2.2 (H) 06/17/2017 0938   CALCIUM 9.3 06/17/2017 0938   PROT 7.1 06/17/2017 0938   ALBUMIN 4.1 06/17/2017 0938   AST 25 06/17/2017 0938   ALT 31 06/17/2017 0938   ALKPHOS 76 06/17/2017 0938   BILITOT 0.68 06/17/2017 0938   GFRNONAA 34 (L) 04/23/2016 0345   GFRNONAA 33 (L) 09/11/2013 0920   GFRAA 40 (L) 04/23/2016 0345   GFRAA 38 (L) 09/11/2013 0920       Component Value Date/Time   WBC 12.5 (H) 06/17/2017 0938   WBC 13.0 (H) 04/22/2016 0448    RBC 5.26 06/17/2017 0938   RBC 4.10 (L) 04/22/2016 0448   HGB 14.7 06/17/2017  0938   HCT 44.7 06/17/2017 0938   PLT 225 06/17/2017 0938   MCV 85.0 06/17/2017 0938   MCH 27.9 06/17/2017 0938   MCH 27.6 04/22/2016 0448   MCHC 32.8 06/17/2017 0938   MCHC 33.5 04/22/2016 0448   RDW 13.4 06/17/2017 0938   LYMPHSABS 1.2 06/17/2017 0938   MONOABS 0.6 06/17/2017 0938   EOSABS 0.1 06/17/2017 0938   BASOSABS 0.1 06/17/2017 0938   -------------------------------  Imaging from last 24 hours (if applicable):  Radiology interpretation: No results found.      This case was discussed with Dr. Alvy Bimler. She expresses agreement with my management of this patient.

## 2017-06-17 NOTE — Telephone Encounter (Signed)
Pt came to [facility] lobby asking to see Dr Alvy Bimler. States his  Upper abdomen" is swollen- started in groin area and has moved up". Started ~ 1 week ago.  Eating and drinking without problems.  Bowels moving every day.

## 2017-07-08 ENCOUNTER — Ambulatory Visit (HOSPITAL_COMMUNITY)
Admission: RE | Admit: 2017-07-08 | Discharge: 2017-07-08 | Disposition: A | Discharge status: Home / Self Care | Payer: Medicare Other | Source: Ambulatory Visit | Attending: Medical | Admitting: Medical

## 2017-07-08 DIAGNOSIS — C8338 Diffuse large B-cell lymphoma, lymph nodes of multiple sites: Secondary | ICD-10-CM | POA: Diagnosis present

## 2017-07-08 DIAGNOSIS — J439 Emphysema, unspecified: Secondary | ICD-10-CM | POA: Insufficient documentation

## 2017-07-08 DIAGNOSIS — R140 Abdominal distension (gaseous): Secondary | ICD-10-CM

## 2017-07-08 DIAGNOSIS — N400 Enlarged prostate without lower urinary tract symptoms: Secondary | ICD-10-CM | POA: Diagnosis not present

## 2017-07-08 DIAGNOSIS — Z85118 Personal history of other malignant neoplasm of bronchus and lung: Secondary | ICD-10-CM | POA: Diagnosis not present

## 2017-07-08 DIAGNOSIS — I700 Atherosclerosis of aorta: Secondary | ICD-10-CM | POA: Diagnosis not present

## 2017-07-08 DIAGNOSIS — C3492 Malignant neoplasm of unspecified part of left bronchus or lung: Secondary | ICD-10-CM

## 2017-07-08 DIAGNOSIS — Z902 Acquired absence of lung [part of]: Secondary | ICD-10-CM | POA: Insufficient documentation

## 2017-07-08 DIAGNOSIS — Z8572 Personal history of non-Hodgkin lymphomas: Secondary | ICD-10-CM | POA: Diagnosis not present

## 2017-07-08 DIAGNOSIS — K769 Liver disease, unspecified: Secondary | ICD-10-CM | POA: Insufficient documentation

## 2017-07-08 DIAGNOSIS — K4090 Unilateral inguinal hernia, without obstruction or gangrene, not specified as recurrent: Secondary | ICD-10-CM | POA: Insufficient documentation

## 2017-07-08 DIAGNOSIS — R911 Solitary pulmonary nodule: Secondary | ICD-10-CM | POA: Diagnosis not present

## 2017-07-21 ENCOUNTER — Telehealth: Payer: Self-pay

## 2017-07-21 NOTE — Telephone Encounter (Signed)
Called pt to schedule AWV. Pt previously didn't want to schedule but said he would think about it. Patient is coming in for office visit on 1/29. Patient declined to schedule his AWV at this time.    [name] [name], B.A.  Care Guide - [facility]

## 2017-08-02 ENCOUNTER — Encounter: Payer: Self-pay | Admitting: Physician Assistant

## 2017-08-02 ENCOUNTER — Other Ambulatory Visit: Payer: Self-pay

## 2017-08-02 ENCOUNTER — Ambulatory Visit: Payer: Medicare Other | Admitting: Physician Assistant

## 2017-08-02 VITALS — BP 128/60 | HR 88 | Temp 98.4°F | Resp 18 | Ht 70.0 in | Wt 166.8 lb

## 2017-08-02 DIAGNOSIS — I129 Hypertensive chronic kidney disease with stage 1 through stage 4 chronic kidney disease, or unspecified chronic kidney disease: Secondary | ICD-10-CM | POA: Diagnosis not present

## 2017-08-02 DIAGNOSIS — E785 Hyperlipidemia, unspecified: Secondary | ICD-10-CM | POA: Diagnosis not present

## 2017-08-02 DIAGNOSIS — N183 Chronic kidney disease, stage 3 unspecified: Secondary | ICD-10-CM

## 2017-08-02 DIAGNOSIS — I10 Essential (primary) hypertension: Secondary | ICD-10-CM | POA: Diagnosis not present

## 2017-08-02 DIAGNOSIS — J431 Panlobular emphysema: Secondary | ICD-10-CM | POA: Diagnosis not present

## 2017-08-02 DIAGNOSIS — C3492 Malignant neoplasm of unspecified part of left bronchus or lung: Secondary | ICD-10-CM | POA: Diagnosis not present

## 2017-08-02 DIAGNOSIS — E1122 Type 2 diabetes mellitus with diabetic chronic kidney disease: Secondary | ICD-10-CM

## 2017-08-02 NOTE — Progress Notes (Signed)
Subjective:    [name], male    DOB: 11-03-1950, 67 y.o.   MRN: [id number]  Chief Complaint  Patient presents with  . Diabetes  . Hypertension  . Hyperlipidemia  . Follow-up   Presents today for DM, HTN, and hyperlipidemia follow-up. Was last seen on 04/29/17.  Blood pressure, DM, and lipids have been well controlled. 04/29/17 lab results: A1C - 6.4%, LDL - 70.   Overall, he is doing well. No specific concerns or complaints for today's visit.  Finished treatment for stage 1 adenocarcinoma in October 2018, which he is happy about.  Tolerating his current medications well, without adverse effects.   He does not monitor his blood pressure at home.  Checks his blood glucose in the mornings. Levels are 115 and below.  Diet: Not on a diet, eats vegetables, avoids certain fruits - he does not want to raise his glucose  Exercise: walking outside when the weather is nice Eye doctor: yearly, will make his annual appointment soon Nephrologist: "over 3 years ago"   Review of Systems  Constitutional: Negative for chills, fatigue, fever and unexpected weight change.  Eyes: Negative for visual disturbance.  Respiratory: Negative for cough, chest tightness and shortness of breath.   Cardiovascular: Negative for chest pain, palpitations and leg swelling.  Gastrointestinal: Negative for abdominal pain, constipation, diarrhea, nausea and vomiting.  Genitourinary: Negative for decreased urine volume, difficulty urinating, frequency, hematuria and urgency.  Neurological: Negative for dizziness, weakness, numbness and headaches.    Patient Active Problem List   Diagnosis Date Noted  . Hypertensive renal disease, stage 1-4 or unspecified chronic kidney disease 04/29/2017  . COPD (chronic obstructive pulmonary disease) with emphysema ([place]) 10/26/2016  . Incisional pain 07/02/2016  . Adenocarcinoma of lung, stage 1, left ([place]) 04/15/2016  . Essential hypertension, benign  03/02/2016  . Port catheter in place 10/31/2015  . Nodule of left lung 09/11/2015  . Restrictive lung disease 04/23/2015  . Reactive thrombocytosis 01/09/2015  . Elevated liver enzymes 01/09/2015  . Leukocytosis 12/19/2014  . Thrombocytopenia due to drugs 12/11/2014  . Diffuse large B-cell lymphoma of lymph nodes of multiple regions ([place]) 11/04/2014  . Anemia in neoplastic disease 11/04/2014  . Hyperlipidemia 05/11/2013  . DM (diabetes mellitus), type 2 with renal complications ([place])   . Chronic kidney disease, stage III (moderate) (HCC)   . History of tobacco abuse   . Erectile dysfunction    Prior to Admission medications   Medication Sig Start Date End Date Taking? Authorizing Provider  amLODipine (NORVASC) 10 MG tablet TAKE ONE TABLET BY MOUTH ONCE DAILY 10/26/16  Yes Jeffery, Chelle, PA-C  aspirin 81 MG tablet Take 81 mg by mouth daily.   Yes [provider]  fish oil-omega-3 fatty acids 1000 MG capsule Take 1 g by mouth daily.   Yes [provider]  glipiZIDE (GLUCOTROL XL) 10 MG 24 hr tablet TAKE ONE TABLET BY MOUTH ONCE DAILY 01/25/17  Yes Jeffery, Chelle, PA-C  glucose blood (GLUCOSE METER TEST) test strip Use as instructed 11/30/16  Yes Jeffery, Chelle, PA-C  hydrochlorothiazide (HYDRODIURIL) 25 MG tablet Take 1 tablet (25 mg total) by mouth daily. 10/26/16  Yes Jeffery, Chelle, PA-C  Insulin Glargine (LANTUS) 100 UNIT/ML Solostar Pen Inject 10 Units into the skin daily at 10 pm. 11/05/16  Yes Jacqulynn Cadet, Chelle, PA-C  Insulin Pen Needle 32G X 4 MM MISC Use to check home glucose as directed 11/30/16  Yes Jeffery, Chelle, PA-C  Lancets (ACCU-CHEK SOFT  TOUCH) lancets Use as instructed 11/08/16  Yes Jeffery, Chelle, PA-C  losartan (COZAAR) 100 MG tablet Take 1 tablet (100 mg total) by mouth daily. 10/26/16  Yes Jeffery, Chelle, PA-C  metoprolol tartrate (LOPRESSOR) 25 MG tablet Take 1 tablet (25 mg total) by mouth 2 (two) times daily. 01/25/17  Yes Jeffery, Chelle, PA-C    pravastatin (PRAVACHOL) 40 MG tablet TAKE ONE TABLET BY MOUTH ONCE DAILY 10/26/16  Yes Jeffery, Chelle, PA-C  sitaGLIPtin (JANUVIA) 50 MG tablet Take 1 tablet (50 mg total) by mouth daily. 01/25/17  Yes Harrison Mons, PA-C   Allergies  Allergen Reactions  . Ace Inhibitors Hives  . Rituximab Other (See Comments)    This medication caused him to sweat & shake. Patient is able to tolerate non-RIR rituximab       Objective:   Physical Exam  Constitutional: He is oriented to person, place, and time. He appears well-developed and well-nourished.  Neck: No JVD present. No thyromegaly present.  Cardiovascular: Normal rate, regular rhythm, normal heart sounds and intact distal pulses. Exam reveals no gallop and no friction rub.  No murmur heard. Pulses:      Radial pulses are 2+ on the right side, and 2+ on the left side.       Posterior tibial pulses are 2+ on the right side, and 2+ on the left side.  Pulmonary/Chest: Effort normal and breath sounds normal. No respiratory distress. He has no wheezes. He has no rales. He exhibits no tenderness.  Musculoskeletal: He exhibits no edema.  Lymphadenopathy:       Head (right side): No submental, no submandibular, no tonsillar, no preauricular, no posterior auricular and no occipital adenopathy present.       Head (left side): No submental, no submandibular, no tonsillar, no preauricular, no posterior auricular and no occipital adenopathy present.    He has no cervical adenopathy.  Neurological: He is alert and oriented to person, place, and time.  Skin: Skin is warm and dry.  Psychiatric: He has a normal mood and affect. His behavior is normal.   Blood pressure 128/60, pulse 88, temperature 98.4 F (36.9 C), temperature source Oral, resp. rate 18, height 5\' 10"  (1.778 m), weight 166 lb 12.8 oz (75.7 kg), SpO2 100 %.     Assessment & Plan:  1. Type 2 diabetes mellitus with stage 3 chronic kidney disease, without long-term current use of insulin  ([place]) Has been well controlled. Continue current treatment. Await labs. Adjust treatment as indicated by results.  - Comprehensive metabolic panel - Hemoglobin A1c  2. Chronic kidney disease, stage III (moderate) (HCC) Has not had a follow-up with nephrologist in years. Await lab results. Adjust treatment as indicated by results. If BUN, Cr, and/or GFR levels have changed significantly, will suggest follow-up with nephrologist.  - CBC with Differential/Platelet - Comprehensive metabolic panel  3. Essential hypertension, benign Stable/controlled. Continue current treatment.  - CBC with Differential/Platelet - Comprehensive metabolic panel  4. Hyperlipidemia, unspecified hyperlipidemia type Has been well controlled. Await labs. Adjust treatment as indicated by results.  - Comprehensive metabolic panel - Lipid panel  Return in about 3 months (around 10/31/2017) for re-evalauation of diabetes, HTN hyperlipidema.  Noemi Chapel, PA-S

## 2017-08-02 NOTE — Patient Instructions (Addendum)
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 Please change the name of your primary care provider (with your insurance) to Dr. Delman Cheadle if you cannot change it to me.    IF you received an x-ray today, you will receive an invoice from [facility]. Please contact [facility] at [phone] with questions or concerns regarding your invoice.   IF you received labwork today, you will receive an invoice from [facility]. Please contact [facility] at [phone] with questions or concerns regarding your invoice.   Our billing staff will not be able to assist you with questions regarding bills from these companies.  You will be contacted with the lab results as soon as they are available. The fastest way to get your results is to activate your My Chart account. Instructions are located on the last page of this paperwork. If you have not heard from [place] regarding the results in 2 weeks, please contact this office.

## 2017-08-02 NOTE — Progress Notes (Signed)
[name], male    DOB: 1950/12/26, 67 y.o.   MRN: [id number]  PCP: Harrison Mons, PA-C  Chief Complaint  Patient presents with  . Diabetes  . Hypertension  . Hyperlipidemia  . Follow-up    Subjective:   Presents for evaluation of diabetes, HTN, hyperlipidemia.  Feels pretty good. Tolerating his current medication regimen. Home glucose <115. Diabetes has been controlled, last A1C 6.4%. Doesn't check home BP, has been controlled in the office. LDL last fall was 70. Exercises when the weather is nice. Hasn't seen nephrology is 3 years. Is scheduling his annual eye exam.  Has completed treatment for adenocarcinoma of the lung in 04/2017. Marland Kitchen   Review of Systems  Constitutional: Negative for activity change, appetite change, fatigue and unexpected weight change.  HENT: Positive for voice change (hoarseness is mildly improved). Negative for congestion, dental problem, ear pain, hearing loss, mouth sores, postnasal drip, rhinorrhea, sneezing, sore throat, tinnitus and trouble swallowing.   Eyes: Negative for photophobia, pain, redness and visual disturbance.  Respiratory: Negative for cough, chest tightness and shortness of breath.   Cardiovascular: Negative for chest pain, palpitations and leg swelling.  Gastrointestinal: Negative for abdominal pain, blood in stool, constipation, diarrhea, nausea and vomiting.  Endocrine: Negative for cold intolerance, heat intolerance, polydipsia, polyphagia and polyuria.  Genitourinary: Negative for dysuria, frequency, hematuria and urgency.  Musculoskeletal: Negative for arthralgias, gait problem, myalgias and neck stiffness.  Skin: Negative for rash.  Neurological: Negative for dizziness, speech difficulty, weakness, light-headedness, numbness and headaches.  Hematological: Negative for adenopathy.  Psychiatric/Behavioral: Negative for confusion and sleep disturbance. The patient is not nervous/anxious.         Patient Active Problem List   Diagnosis Date Noted  . Hypertensive renal disease, stage 1-4 or unspecified chronic kidney disease 04/29/2017  . COPD (chronic obstructive pulmonary disease) with emphysema ([place]) 10/26/2016  . Incisional pain 07/02/2016  . Adenocarcinoma of lung, stage 1, left ([place]) 04/15/2016  . Essential hypertension, benign 03/02/2016  . Port catheter in place 10/31/2015  . Nodule of left lung 09/11/2015  . Restrictive lung disease 04/23/2015  . Reactive thrombocytosis 01/09/2015  . Elevated liver enzymes 01/09/2015  . Leukocytosis 12/19/2014  . Thrombocytopenia due to drugs 12/11/2014  . Diffuse large B-cell lymphoma of lymph nodes of multiple regions ([place]) 11/04/2014  . Anemia in neoplastic disease 11/04/2014  . Hyperlipidemia 05/11/2013  . DM (diabetes mellitus), type 2 with renal complications ([place])   . Chronic kidney disease, stage III (moderate) (HCC)   . History of tobacco abuse   . Erectile dysfunction      Prior to Admission medications   Medication Sig Start Date End Date Taking? Authorizing Provider  amLODipine (NORVASC) 10 MG tablet TAKE ONE TABLET BY MOUTH ONCE DAILY 10/26/16  Yes Avin Gibbons, PA-C  aspirin 81 MG tablet Take 81 mg by mouth daily.   Yes [provider]  fish oil-omega-3 fatty acids 1000 MG capsule Take 1 g by mouth daily.   Yes [provider]  glipiZIDE (GLUCOTROL XL) 10 MG 24 hr tablet TAKE ONE TABLET BY MOUTH ONCE DAILY 01/25/17  Yes Golden Gilreath, PA-C  glucose blood (GLUCOSE METER TEST) test strip Use as instructed 11/30/16  Yes Kesley Gaffey, PA-C  hydrochlorothiazide (HYDRODIURIL) 25 MG tablet Take 1 tablet (25 mg total) by mouth daily. 10/26/16  Yes Divine Hansley, PA-C  Insulin Glargine (LANTUS) 100 UNIT/ML Solostar Pen Inject 10 Units into the skin daily at 10 pm.  11/05/16  Yes Dawnielle Christiana, PA-C  Insulin Pen Needle 32G X 4 MM MISC Use to check home glucose as directed 11/30/16  Yes Kassady Laboy,  Sissy Goetzke, PA-C  Lancets (ACCU-CHEK SOFT TOUCH) lancets Use as instructed 11/08/16  Yes Madisson Kulaga, PA-C  losartan (COZAAR) 100 MG tablet Take 1 tablet (100 mg total) by mouth daily. 10/26/16  Yes Sarabi Sockwell, PA-C  metoprolol tartrate (LOPRESSOR) 25 MG tablet Take 1 tablet (25 mg total) by mouth 2 (two) times daily. 01/25/17  Yes Girolamo Lortie, PA-C  pravastatin (PRAVACHOL) 40 MG tablet TAKE ONE TABLET BY MOUTH ONCE DAILY 10/26/16  Yes Dayvin Aber, PA-C  sitaGLIPtin (JANUVIA) 50 MG tablet Take 1 tablet (50 mg total) by mouth daily. 01/25/17  Yes Harrison Mons, PA-C     Allergies  Allergen Reactions  . Ace Inhibitors Hives  . Rituximab Other (See Comments)    This medication caused him to sweat & shake. Patient is able to tolerate non-RIR rituximab        Objective:  Physical Exam  Constitutional: He is oriented to person, place, and time. He appears well-developed and well-nourished. He is active and cooperative. No distress.  BP 128/60 (BP Location: Right Arm, Patient Position: Sitting, Cuff Size: Normal)   Pulse 88   Temp 98.4 F (36.9 C) (Oral)   Resp 18   Ht 5\' 10"  (1.778 m)   Wt 166 lb 12.8 oz (75.7 kg)   SpO2 100%   BMI 23.93 kg/m   HENT:  Head: Normocephalic and atraumatic.  Right Ear: Hearing normal.  Left Ear: Hearing normal.  Eyes: Conjunctivae are normal. No scleral icterus.  Neck: Normal range of motion and full passive range of motion without pain. Neck supple. No thyromegaly present.  Cardiovascular: Normal rate, regular rhythm and normal heart sounds.  Pulses:      Radial pulses are 2+ on the right side, and 2+ on the left side.  Pulmonary/Chest: Effort normal and breath sounds normal.  Lymphadenopathy:       Head (right side): No tonsillar, no preauricular, no posterior auricular and no occipital adenopathy present.       Head (left side): No tonsillar, no preauricular, no posterior auricular and no occipital adenopathy present.    He has no  cervical adenopathy.       Right: No supraclavicular adenopathy present.       Left: No supraclavicular adenopathy present.  Neurological: He is alert and oriented to person, place, and time. No sensory deficit.  Skin: Skin is warm, dry and intact. No rash noted. No cyanosis or erythema. Nails show no clubbing.  Psychiatric: He has a normal mood and affect. His speech is normal and behavior is normal.           Assessment & Plan:   Problem List Items Addressed This Visit    DM (diabetes mellitus), type 2 with renal complications ([place]) - Primary (Chronic)    Has been controlled. Will adjust regimen if A1C >7%.      Relevant Orders   Comprehensive metabolic panel (Completed)   Hemoglobin A1c (Completed)   Chronic kidney disease, stage III (moderate) (HCC) (Chronic)    Has not seen nephrology since 2016. Will forward lab results.      Relevant Orders   CBC with Differential/Platelet (Completed)   Comprehensive metabolic panel (Completed)   Hyperlipidemia    Await labs. Adjust regimen as indicated by results. LDL goal <70. If needs additional lowering, would change from pravastatin to more potent  statin.      Relevant Orders   Comprehensive metabolic panel (Completed)   Lipid panel (Completed)   Essential hypertension, benign    Stable. Controlled. Continue current treatment of amlodipine, Lopressor and Cozaar.      Relevant Orders   CBC with Differential/Platelet (Completed)   Comprehensive metabolic panel (Completed)   Adenocarcinoma of lung, stage 1, left ([place])    Continue follow-up surveillance per oncology.      COPD (chronic obstructive pulmonary disease) with emphysema (HCC)    Stable. Asymptomatic.      Hypertensive renal disease, stage 1-4 or unspecified chronic kidney disease       Return in about 3 months (around 10/31/2017) for re-evalauation of diabetes, HTN hyperlipidema.   Fara Chute, PA-C [facility]

## 2017-08-03 LAB — COMPREHENSIVE METABOLIC PANEL
ALBUMIN: 4.5 g/dL (ref 3.6–4.8)
ALT: 22 IU/L (ref 0–44)
AST: 19 IU/L (ref 0–40)
Albumin/Globulin Ratio: 2 (ref 1.2–2.2)
Alkaline Phosphatase: 70 IU/L (ref 39–117)
BUN / CREAT RATIO: 11 (ref 10–24)
BUN: 22 mg/dL (ref 8–27)
Bilirubin Total: 0.6 mg/dL (ref 0.0–1.2)
CALCIUM: 9.3 mg/dL (ref 8.6–10.2)
CO2: 22 mmol/L (ref 20–29)
Chloride: 102 mmol/L (ref 96–106)
Creatinine, Ser: 2.06 mg/dL — ABNORMAL HIGH (ref 0.76–1.27)
GFR, EST AFRICAN AMERICAN: 37 mL/min/{1.73_m2} — AB (ref >59)
GFR, EST NON AFRICAN AMERICAN: 32 mL/min/{1.73_m2} — AB (ref >59)
Globulin, Total: 2.2 g/dL (ref 1.5–4.5)
Glucose: 105 mg/dL — ABNORMAL HIGH (ref 65–99)
Potassium: 4.1 mmol/L (ref 3.5–5.2)
Sodium: 143 mmol/L (ref 134–144)
TOTAL PROTEIN: 6.7 g/dL (ref 6.0–8.5)

## 2017-08-03 LAB — CBC WITH DIFFERENTIAL/PLATELET
BASOS: 0 %
Basophils Absolute: 0 10*3/uL (ref 0.0–0.2)
EOS (ABSOLUTE): 0.1 10*3/uL (ref 0.0–0.4)
EOS: 1 %
HEMOGLOBIN: 14.4 g/dL (ref 13.0–17.7)
Hematocrit: 43.1 % (ref 37.5–51.0)
IMMATURE GRANS (ABS): 0 10*3/uL (ref 0.0–0.1)
IMMATURE GRANULOCYTES: 0 %
LYMPHS: 13 %
Lymphocytes Absolute: 1.5 10*3/uL (ref 0.7–3.1)
MCH: 28.6 pg (ref 26.6–33.0)
MCHC: 33.4 g/dL (ref 31.5–35.7)
MCV: 86 fL (ref 79–97)
MONOCYTES: 5 %
Monocytes Absolute: 0.6 10*3/uL (ref 0.1–0.9)
NEUTROS ABS: 9.3 10*3/uL — AB (ref 1.4–7.0)
NEUTROS PCT: 81 %
Platelets: 252 10*3/uL (ref 150–379)
RBC: 5.04 x10E6/uL (ref 4.14–5.80)
RDW: 14.1 % (ref 12.3–15.4)
WBC: 11.6 10*3/uL — ABNORMAL HIGH (ref 3.4–10.8)

## 2017-08-03 LAB — LIPID PANEL
CHOL/HDL RATIO: 3.8 ratio (ref 0.0–5.0)
CHOLESTEROL TOTAL: 127 mg/dL (ref 100–199)
HDL: 33 mg/dL — ABNORMAL LOW (ref >39)
LDL CALC: 71 mg/dL (ref 0–99)
TRIGLYCERIDES: 113 mg/dL (ref 0–149)
VLDL CHOLESTEROL CAL: 23 mg/dL (ref 5–40)

## 2017-08-03 LAB — HEMOGLOBIN A1C
Est. average glucose Bld gHb Est-mCnc: 143 mg/dL
Hgb A1c MFr Bld: 6.6 % — ABNORMAL HIGH (ref 4.8–5.6)

## 2017-08-06 NOTE — Assessment & Plan Note (Signed)
Continue follow-up surveillance per oncology.

## 2017-08-06 NOTE — Assessment & Plan Note (Signed)
Await labs. Adjust regimen as indicated by results. LDL goal <70. If needs additional lowering, would change from pravastatin to more potent statin.

## 2017-08-06 NOTE — Assessment & Plan Note (Signed)
Has been controlled. Will adjust regimen if A1C >7%.

## 2017-08-06 NOTE — Assessment & Plan Note (Signed)
Stable. Asymptomatic.

## 2017-08-06 NOTE — Assessment & Plan Note (Signed)
Has not seen nephrology since 2016. Will forward lab results.

## 2017-08-06 NOTE — Assessment & Plan Note (Signed)
Stable. Controlled. Continue current treatment of amlodipine, Lopressor and Cozaar.

## 2017-08-07 ENCOUNTER — Encounter: Payer: Self-pay | Admitting: Physician Assistant

## 2017-08-30 ENCOUNTER — Inpatient Hospital Stay: Payer: Medicare Other

## 2017-08-30 ENCOUNTER — Encounter: Payer: Self-pay | Admitting: Hematology and Oncology

## 2017-08-30 ENCOUNTER — Telehealth: Payer: Self-pay | Admitting: Hematology and Oncology

## 2017-08-30 ENCOUNTER — Inpatient Hospital Stay: Payer: Medicare Other | Attending: Hematology and Oncology | Admitting: Hematology and Oncology

## 2017-08-30 DIAGNOSIS — C8338 Diffuse large B-cell lymphoma, lymph nodes of multiple sites: Secondary | ICD-10-CM

## 2017-08-30 DIAGNOSIS — Z8572 Personal history of non-Hodgkin lymphomas: Secondary | ICD-10-CM | POA: Diagnosis not present

## 2017-08-30 DIAGNOSIS — N183 Chronic kidney disease, stage 3 unspecified: Secondary | ICD-10-CM

## 2017-08-30 DIAGNOSIS — Z85118 Personal history of other malignant neoplasm of bronchus and lung: Secondary | ICD-10-CM | POA: Diagnosis not present

## 2017-08-30 DIAGNOSIS — C3492 Malignant neoplasm of unspecified part of left bronchus or lung: Secondary | ICD-10-CM

## 2017-08-30 DIAGNOSIS — R600 Localized edema: Secondary | ICD-10-CM

## 2017-08-30 LAB — CBC WITH DIFFERENTIAL/PLATELET
Basophils Absolute: 0 10*3/uL (ref 0.0–0.1)
Basophils Relative: 0 %
Eosinophils Absolute: 0.2 10*3/uL (ref 0.0–0.5)
Eosinophils Relative: 2 %
HEMATOCRIT: 40.9 % (ref 38.4–49.9)
HEMOGLOBIN: 13.7 g/dL (ref 13.0–17.1)
LYMPHS PCT: 15 %
Lymphs Abs: 1.7 10*3/uL (ref 0.9–3.3)
MCH: 28.5 pg (ref 27.2–33.4)
MCHC: 33.5 g/dL (ref 32.0–36.0)
MCV: 85 fL (ref 79.3–98.0)
Monocytes Absolute: 0.7 10*3/uL (ref 0.1–0.9)
Monocytes Relative: 6 %
NEUTROS PCT: 77 %
Neutro Abs: 8.9 10*3/uL — ABNORMAL HIGH (ref 1.5–6.5)
Platelets: 214 10*3/uL (ref 140–400)
RBC: 4.81 MIL/uL (ref 4.20–5.82)
RDW: 13.2 % (ref 11.0–14.6)
WBC: 11.5 10*3/uL — AB (ref 4.0–10.3)

## 2017-08-30 LAB — COMPREHENSIVE METABOLIC PANEL
ALT: 34 U/L (ref 0–55)
AST: 24 U/L (ref 5–34)
Albumin: 3.8 g/dL (ref 3.5–5.0)
Alkaline Phosphatase: 67 U/L (ref 40–150)
Anion gap: 9 (ref 3–11)
BUN: 24 mg/dL (ref 7–26)
CO2: 26 mmol/L (ref 22–29)
Calcium: 9.4 mg/dL (ref 8.4–10.4)
Chloride: 107 mmol/L (ref 98–109)
Creatinine, Ser: 2.04 mg/dL — ABNORMAL HIGH (ref 0.70–1.30)
GFR calc non Af Amer: 32 mL/min — ABNORMAL LOW (ref >60)
GFR, EST AFRICAN AMERICAN: 37 mL/min — AB (ref >60)
Glucose, Bld: 104 mg/dL (ref 70–140)
POTASSIUM: 4 mmol/L (ref 3.5–5.1)
Sodium: 142 mmol/L (ref 136–145)
Total Bilirubin: 0.7 mg/dL (ref 0.2–1.2)
Total Protein: 6.5 g/dL (ref 6.4–8.3)

## 2017-08-30 NOTE — Assessment & Plan Note (Addendum)
I review recent guidelines. He does not need adjuvant treatment. PET CT scan from 02/25/2017 and recent CT scan in January 2019 showed no evidence of disease He will continue surveillance imaging once a year

## 2017-08-30 NOTE — Assessment & Plan Note (Signed)
He has chronic kidney disease.  Continue close monitoring. he will continue current medical management.

## 2017-08-30 NOTE — Telephone Encounter (Signed)
Gave patient AVs and calendar of upcoming august appointments.  °

## 2017-08-30 NOTE — Progress Notes (Signed)
[facility] OFFICE PROGRESS NOTE  Patient Care Team: Harrison Mons, PA-C as PCP - General (Physician Assistant) Corliss Parish, MD as Consulting Physician (Nephrology) Fanny Skates, MD as Consulting Physician (General Surgery) Heath Lark, MD as Consulting Physician (Hematology and Oncology) Javier Glazier, MD as Consulting Physician (Pulmonary Disease) Juanita Craver, MD as Consulting Physician (Gastroenterology)  ASSESSMENT & PLAN:  Diffuse large B-cell lymphoma of lymph nodes of multiple regions [facility]) Clinically, he has no evidence of disease recurrence. I plan to see him back again in 6 months with history, physical examination and blood work only  Adenocarcinoma of lung, stage 1, left ([place]) I review recent guidelines. He does not need adjuvant treatment. PET CT scan from 02/25/2017 and recent CT scan in January 2019 showed no evidence of disease He will continue surveillance imaging once a year  Chronic kidney disease, stage III (moderate) He has chronic kidney disease.  Continue close monitoring. he will continue current medical management.   No orders of the defined types were placed in this encounter.   INTERVAL HISTORY: Please see below for problem oriented charting. He returns for further follow-up He denies recent cough, chest pain or shortness of breath No recent infection No new lymphadenopathy Appetite is stable, no recent weight loss  SUMMARY OF ONCOLOGIC HISTORY: Oncology History   Lymphoma   Staging form: Lymphoid Neoplasms, AJCC 6th Edition     Clinical stage from 11/21/2014: Stage IV - Signed by Heath Lark, MD on 11/21/2014       Diffuse large B-cell lymphoma of lymph nodes of multiple regions ([place])   11/13/2014 Imaging    PEt scan showed widespread disease including possible splenic involvement      11/19/2014 Surgery    He has excisional LN biopsy and port placement      11/19/2014 Pathology Results    Biopsy of lymph  nodes showed DLBCL      11/29/2014 - 01/31/2015 Chemotherapy    He received 4 cycles of RCHOP chemotherapy complicated by severe infusion reaction.      12/19/2014 - 01/09/2015 Chemotherapy    He received 2 cycles of prophylactic IT chemotherapy.      01/31/2015 Adverse Reaction    I reduce the dose of his chemotherapy, prednisone and dexamethasone due to poorly controlled diabetes and hyperglycemia      02/12/2015 Imaging     repeat PET CT scan show complete response to chemotherapy but new lung infiltrates      09/09/2015 Imaging    PET CT showed complete response except for new inflammatory change in the lung      03/01/2016 Imaging    CT chest showed mild interval growth of subsolid left upper lobe pulmonary nodule, which was hypermetabolic on 67/67/2094 PET-CT. A primary bronchogenic adenocarcinoma cannot be excluded. Thoracic surgical consultation is advised. No lymphadenopathy or other findings of metastatic disease in the chest. Moderate emphysema with mild diffuse bronchial wall thickening, suggesting COPD. Aortic atherosclerosis. Stable small pericardial effusion/thickening.      08/30/2016 PET scan    No evidence of hypermetabolic residual or recurrent lymphoma. 2. Interval left upper lobe wedge resection for adenocarcinoma. Healing left rib fractures which are likely postsurgical. 3. Aortic atherosclerosis.      02/25/2017 PET scan    1. No abnormal nodal enlargement or hypermetabolic activity to suggest recurrent lymphoma. Postoperative findings in the left chest without findings of recurrent left lung cancer. 2. Aortic Atherosclerosis (ICD10-I70.0) and Emphysema (ICD10-J43.9).  Adenocarcinoma of lung, stage 1, left ([place])   03/24/2016 Procedure    He underwent electromagnetic navigational bronchoscopy with needle aspirations, brushings, and biopsies.      03/24/2016 Pathology Results    Accession: [id number] biopsy was positive for adenocarcinoma      04/15/2016  Initial Diagnosis    Adenocarcinoma of lung, stage 1, left ([place])      04/15/2016 Surgery    He underwent left video-assisted thoracoscopy, thoracoscopic left upper lobectomy, mediastinal lymph node dissection, on-Q local anesthetic catheter placement      04/15/2016 Pathology Results    Accession: [id number] surgical specimen revealed adenocarcinoma, 2.2 cm with negative margin and negative LN involvement      07/08/2017 Imaging    1. No evidence of local tumor recurrence in the left hemithorax status post left upper lobectomy. 2. No evidence of recurrent lymphoma. No adenopathy. Normal size spleen. 3. Solitary 5 mm solid pulmonary nodule in the superior segment right lower lobe, for which 16 month stability has been demonstrated, probably benign. 4. Subcentimeter low-attenuation lateral segment left liver lobe lesion, for which 16 month stability has been demonstrated, probably benign. 5. No evidence of bowel obstruction or acute bowel inflammation. 6. Chronic findings include: Aortic Atherosclerosis (ICD10-I70.0) and Emphysema (ICD10-J43.9). Mild-to-moderate prostatomegaly. Small fat containing left inguinal hernia.       REVIEW OF SYSTEMS:   Constitutional: Denies fevers, chills or abnormal weight loss Eyes: Denies blurriness of vision Ears, nose, mouth, throat, and face: Denies mucositis or sore throat Respiratory: Denies cough, dyspnea or wheezes Cardiovascular: Denies palpitation, chest discomfort or lower extremity swelling Gastrointestinal:  Denies nausea, heartburn or change in bowel habits Skin: Denies abnormal skin rashes Lymphatics: Denies new lymphadenopathy or easy bruising Neurological:Denies numbness, tingling or new weaknesses Behavioral/Psych: Mood is stable, no new changes  All other systems were reviewed with the patient and are negative.  I have reviewed the past medical history, past surgical history, social history and family history with the patient and they  are unchanged from previous note.  ALLERGIES:  is allergic to ace inhibitors and rituximab.  MEDICATIONS:  Current Outpatient Medications  Medication Sig Dispense Refill  . amLODipine (NORVASC) 10 MG tablet TAKE ONE TABLET BY MOUTH ONCE DAILY 90 tablet 3  . aspirin 81 MG tablet Take 81 mg by mouth daily.    . fish oil-omega-3 fatty acids 1000 MG capsule Take 1 g by mouth daily.    Marland Kitchen glipiZIDE (GLUCOTROL XL) 10 MG 24 hr tablet TAKE ONE TABLET BY MOUTH ONCE DAILY 90 tablet 3  . glucose blood (GLUCOSE METER TEST) test strip Use as instructed 100 each 3  . hydrochlorothiazide (HYDRODIURIL) 25 MG tablet Take 1 tablet (25 mg total) by mouth daily. 90 tablet 3  . Insulin Glargine (LANTUS) 100 UNIT/ML Solostar Pen Inject 10 Units into the skin daily at 10 pm. 15 mL 11  . Insulin Pen Needle 32G X 4 MM MISC Use to check home glucose as directed 100 each 3  . Lancets (ACCU-CHEK SOFT TOUCH) lancets Use as instructed 100 each 2  . losartan (COZAAR) 100 MG tablet Take 1 tablet (100 mg total) by mouth daily. 90 tablet 3  . metoprolol tartrate (LOPRESSOR) 25 MG tablet Take 1 tablet (25 mg total) by mouth 2 (two) times daily. 180 tablet 3  . pravastatin (PRAVACHOL) 40 MG tablet TAKE ONE TABLET BY MOUTH ONCE DAILY 90 tablet 3  . sitaGLIPtin (JANUVIA) 50 MG tablet Take 1 tablet (50 mg total) by mouth  daily. 30 tablet 12   No current facility-administered medications for this visit.     PHYSICAL EXAMINATION: ECOG PERFORMANCE STATUS: 0 - Asymptomatic  Vitals:   08/30/17 0805  BP: (!) 144/65  Pulse: (!) 59  Resp: 18  Temp: 97.9 F (36.6 C)  SpO2: 100%   Filed Weights   08/30/17 0805  Weight: 169 lb 14.4 oz (77.1 kg)    GENERAL:alert, no distress and comfortable SKIN: skin color, texture, turgor are normal, no rashes or significant lesions EYES: normal, Conjunctiva are pink and non-injected, sclera clear OROPHARYNX:no exudate, no erythema and lips, buccal mucosa, and tongue normal  NECK:  supple, thyroid normal size, non-tender, without nodularity LYMPH:  no palpable lymphadenopathy in the cervical, axillary or inguinal LUNGS: clear to auscultation and percussion with normal breathing effort HEART: regular rate & rhythm and no murmurs and no lower extremity edema ABDOMEN:abdomen soft, non-tender and normal bowel sounds Musculoskeletal:no cyanosis of digits and no clubbing  NEURO: alert & oriented x 3 with fluent speech, no focal motor/sensory deficits  LABORATORY DATA:  I have reviewed the data as listed    Component Value Date/Time   NA 142 08/30/2017 0743   NA 143 08/02/2017 0930   NA 141 06/17/2017 0938   K 4.0 08/30/2017 0743   K 4.1 06/17/2017 0938   CL 107 08/30/2017 0743   CO2 26 08/30/2017 0743   CO2 25 06/17/2017 0938   GLUCOSE 104 08/30/2017 0743   GLUCOSE 144 (H) 06/17/2017 0938   BUN 24 08/30/2017 0743   BUN 22 08/02/2017 0930   BUN 30.5 (H) 06/17/2017 0938   CREATININE 2.04 (H) 08/30/2017 0743   CREATININE 2.2 (H) 06/17/2017 0938   CALCIUM 9.4 08/30/2017 0743   CALCIUM 9.3 06/17/2017 0938   PROT 6.5 08/30/2017 0743   PROT 6.7 08/02/2017 0930   PROT 7.1 06/17/2017 0938   ALBUMIN 3.8 08/30/2017 0743   ALBUMIN 4.5 08/02/2017 0930   ALBUMIN 4.1 06/17/2017 0938   AST 24 08/30/2017 0743   AST 25 06/17/2017 0938   ALT 34 08/30/2017 0743   ALT 31 06/17/2017 0938   ALKPHOS 67 08/30/2017 0743   ALKPHOS 76 06/17/2017 0938   BILITOT 0.7 08/30/2017 0743   BILITOT 0.6 08/02/2017 0930   BILITOT 0.68 06/17/2017 0938   GFRNONAA 32 (L) 08/30/2017 0743   GFRNONAA 33 (L) 09/11/2013 0920   GFRAA 37 (L) 08/30/2017 0743   GFRAA 38 (L) 09/11/2013 0920    No results found for: SPEP, UPEP  Lab Results  Component Value Date   WBC 11.5 (H) 08/30/2017   NEUTROABS 8.9 (H) 08/30/2017   HGB 13.7 08/30/2017   HCT 40.9 08/30/2017   MCV 85.0 08/30/2017   PLT 214 08/30/2017      Chemistry      Component Value Date/Time   NA 142 08/30/2017 0743   NA 143  08/02/2017 0930   NA 141 06/17/2017 0938   K 4.0 08/30/2017 0743   K 4.1 06/17/2017 0938   CL 107 08/30/2017 0743   CO2 26 08/30/2017 0743   CO2 25 06/17/2017 0938   BUN 24 08/30/2017 0743   BUN 22 08/02/2017 0930   BUN 30.5 (H) 06/17/2017 0938   CREATININE 2.04 (H) 08/30/2017 0743   CREATININE 2.2 (H) 06/17/2017 0938   GLU 382 (H) 01/31/2015 1551      Component Value Date/Time   CALCIUM 9.4 08/30/2017 0743   CALCIUM 9.3 06/17/2017 0938   ALKPHOS 67 08/30/2017 0743   ALKPHOS  76 06/17/2017 0938   AST 24 08/30/2017 0743   AST 25 06/17/2017 0938   ALT 34 08/30/2017 0743   ALT 31 06/17/2017 0938   BILITOT 0.7 08/30/2017 0743   BILITOT 0.6 08/02/2017 0930   BILITOT 0.68 06/17/2017 0938       All questions were answered. The patient knows to call the clinic with any problems, questions or concerns. No barriers to learning was detected.  I spent 15 minutes counseling the patient face to face. The total time spent in the appointment was 20 minutes and more than 50% was on counseling and review of test results  Heath Lark, MD 08/30/2017 9:50 AM

## 2017-08-30 NOTE — Assessment & Plan Note (Signed)
Clinically, he has no evidence of disease recurrence. I plan to see him back again in 6 months with history, physical examination and blood work only

## 2017-10-10 ENCOUNTER — Encounter: Payer: Self-pay | Admitting: Physician Assistant

## 2017-11-02 ENCOUNTER — Ambulatory Visit: Payer: Medicare Other | Admitting: Physician Assistant

## 2017-11-02 ENCOUNTER — Other Ambulatory Visit: Payer: Self-pay

## 2017-11-02 ENCOUNTER — Encounter: Payer: Self-pay | Admitting: Physician Assistant

## 2017-11-02 VITALS — BP 120/60 | HR 58 | Temp 98.1°F | Resp 16 | Ht 70.0 in | Wt 168.6 lb

## 2017-11-02 DIAGNOSIS — I10 Essential (primary) hypertension: Secondary | ICD-10-CM | POA: Diagnosis not present

## 2017-11-02 DIAGNOSIS — N183 Chronic kidney disease, stage 3 unspecified: Secondary | ICD-10-CM

## 2017-11-02 DIAGNOSIS — E785 Hyperlipidemia, unspecified: Secondary | ICD-10-CM

## 2017-11-02 DIAGNOSIS — I129 Hypertensive chronic kidney disease with stage 1 through stage 4 chronic kidney disease, or unspecified chronic kidney disease: Secondary | ICD-10-CM | POA: Diagnosis not present

## 2017-11-02 DIAGNOSIS — E1122 Type 2 diabetes mellitus with diabetic chronic kidney disease: Secondary | ICD-10-CM | POA: Diagnosis not present

## 2017-11-02 LAB — COMPREHENSIVE METABOLIC PANEL
A/G RATIO: 1.9 (ref 1.2–2.2)
ALK PHOS: 77 IU/L (ref 39–117)
ALT: 29 IU/L (ref 0–44)
AST: 23 IU/L (ref 0–40)
Albumin: 4.2 g/dL (ref 3.6–4.8)
BUN / CREAT RATIO: 15 (ref 10–24)
BUN: 31 mg/dL — AB (ref 8–27)
Bilirubin Total: 0.4 mg/dL (ref 0.0–1.2)
CHLORIDE: 104 mmol/L (ref 96–106)
CO2: 22 mmol/L (ref 20–29)
Calcium: 9.3 mg/dL (ref 8.6–10.2)
Creatinine, Ser: 2.07 mg/dL — ABNORMAL HIGH (ref 0.76–1.27)
GFR calc non Af Amer: 32 mL/min/{1.73_m2} — ABNORMAL LOW (ref >59)
GFR, EST AFRICAN AMERICAN: 37 mL/min/{1.73_m2} — AB (ref >59)
GLUCOSE: 134 mg/dL — AB (ref 65–99)
Globulin, Total: 2.2 g/dL (ref 1.5–4.5)
POTASSIUM: 4.3 mmol/L (ref 3.5–5.2)
Sodium: 142 mmol/L (ref 134–144)
Total Protein: 6.4 g/dL (ref 6.0–8.5)

## 2017-11-02 LAB — CBC WITH DIFFERENTIAL/PLATELET
BASOS ABS: 0 10*3/uL (ref 0.0–0.2)
BASOS: 0 %
EOS (ABSOLUTE): 0.2 10*3/uL (ref 0.0–0.4)
Eos: 2 %
Hematocrit: 41.7 % (ref 37.5–51.0)
Hemoglobin: 13.9 g/dL (ref 13.0–17.7)
Immature Grans (Abs): 0.1 10*3/uL (ref 0.0–0.1)
Immature Granulocytes: 1 %
LYMPHS ABS: 1.6 10*3/uL (ref 0.7–3.1)
Lymphs: 15 %
MCH: 28.5 pg (ref 26.6–33.0)
MCHC: 33.3 g/dL (ref 31.5–35.7)
MCV: 86 fL (ref 79–97)
MONOS ABS: 0.6 10*3/uL (ref 0.1–0.9)
Monocytes: 6 %
NEUTROS ABS: 8.1 10*3/uL — AB (ref 1.4–7.0)
Neutrophils: 76 %
PLATELETS: 241 10*3/uL (ref 150–379)
RBC: 4.88 x10E6/uL (ref 4.14–5.80)
RDW: 13.9 % (ref 12.3–15.4)
WBC: 10.6 10*3/uL (ref 3.4–10.8)

## 2017-11-02 LAB — HEMOGLOBIN A1C
Est. average glucose Bld gHb Est-mCnc: 157 mg/dL
Hgb A1c MFr Bld: 7.1 % — ABNORMAL HIGH (ref 4.8–5.6)

## 2017-11-02 LAB — LIPID PANEL
CHOL/HDL RATIO: 4 ratio (ref 0.0–5.0)
CHOLESTEROL TOTAL: 136 mg/dL (ref 100–199)
HDL: 34 mg/dL — AB (ref >39)
LDL CALC: 81 mg/dL (ref 0–99)
TRIGLYCERIDES: 103 mg/dL (ref 0–149)
VLDL CHOLESTEROL CAL: 21 mg/dL (ref 5–40)

## 2017-11-02 MED ORDER — GLUCOSE BLOOD VI STRP: ORAL_STRIP | 3 refills | Status: AC

## 2017-11-02 MED ORDER — GLIPIZIDE ER 10 MG PO TB24: ORAL_TABLET | ORAL | 3 refills | Status: AC

## 2017-11-02 MED ORDER — INSULIN GLARGINE 100 UNIT/ML SOLOSTAR PEN: 10.0000 [IU] | PEN_INJECTOR | Freq: Every day | SUBCUTANEOUS | 11 refills | Status: AC

## 2017-11-02 MED ORDER — HYDROCHLOROTHIAZIDE 25 MG PO TABS: 25.0000 mg | ORAL_TABLET | Freq: Every day | ORAL | 3 refills | Status: AC

## 2017-11-02 MED ORDER — PRAVASTATIN SODIUM 40 MG PO TABS: ORAL_TABLET | ORAL | 3 refills | Status: AC

## 2017-11-02 MED ORDER — ACCU-CHEK SOFT TOUCH LANCETS MISC: 3 refills | Status: DC

## 2017-11-02 MED ORDER — METOPROLOL TARTRATE 25 MG PO TABS: 25.0000 mg | ORAL_TABLET | Freq: Two times a day (BID) | ORAL | 3 refills | Status: AC

## 2017-11-02 MED ORDER — SITAGLIPTIN PHOSPHATE 50 MG PO TABS: 50.0000 mg | ORAL_TABLET | Freq: Every day | ORAL | 12 refills | Status: AC

## 2017-11-02 MED ORDER — AMLODIPINE BESYLATE 10 MG PO TABS: ORAL_TABLET | ORAL | 3 refills | Status: AC

## 2017-11-02 MED ORDER — INSULIN PEN NEEDLE 32G X 4 MM MISC: 3 refills | Status: AC

## 2017-11-02 MED ORDER — LOSARTAN POTASSIUM 100 MG PO TABS: 100.0000 mg | ORAL_TABLET | Freq: Every day | ORAL | 3 refills | Status: AC

## 2017-11-02 NOTE — Progress Notes (Signed)
[name], male    DOB: [birth year], 67 y.o.   MRN: [id number]  PCP: Harrison Mons, PA-C  Chief Complaint  Patient presents with  . Hyperlipidemia    follow up   . Diabetes    follow up   . Hypertension    follow up     Subjective:   Presents for evaluation of diabetes, hypertension and hyperlipidemia.  Diabetes and hyperlipidemia have been well controlled. Sees oncology regularly, most recently 08/30/2017. Last nephrology visit over 3 years ago. Renal disease has remained stable.  Doing well, feeling good. Tolerating medications without adverse effects. Has a crystal blue, with chrome rims, '69 [place]. 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  Leg cramps (calves and toes) at night. Gets up and moves around and they resolve, he can go back to sleep. Present x years.   Review of Systems  Constitutional: Negative for activity change, appetite change, fatigue and unexpected weight change.  HENT: Negative for congestion, dental problem, ear pain, hearing loss, mouth sores, postnasal drip, rhinorrhea, sneezing, sore throat, tinnitus and trouble swallowing.   Eyes: Negative for photophobia, pain, redness and visual disturbance.  Respiratory: Negative for cough, chest tightness and shortness of breath.   Cardiovascular: Negative for chest pain, palpitations and leg swelling.  Gastrointestinal: Negative for abdominal pain, blood in stool, constipation, diarrhea, nausea and vomiting.  Endocrine: Negative for cold intolerance, heat intolerance, polydipsia, polyphagia and polyuria.  Genitourinary: Negative for dysuria, frequency, hematuria and urgency.  Musculoskeletal: Negative for arthralgias, gait problem, myalgias and neck stiffness.  Skin: Negative for rash.  Neurological: Negative for dizziness, speech difficulty, weakness, light-headedness, numbness and headaches.  Hematological: Negative for adenopathy.  Psychiatric/Behavioral:  Negative for confusion and sleep disturbance. The patient is not nervous/anxious.        Patient Active Problem List   Diagnosis Date Noted  . Hypertensive renal disease, stage 1-4 or unspecified chronic kidney disease 04/29/2017  . COPD (chronic obstructive pulmonary disease) with emphysema ([place]) 10/26/2016  . Incisional pain 07/02/2016  . Adenocarcinoma of lung, stage 1, left ([place]) 04/15/2016  . Essential hypertension, benign 03/02/2016  . Port catheter in place 10/31/2015  . Nodule of left lung 09/11/2015  . Restrictive lung disease 04/23/2015  . Reactive thrombocytosis 01/09/2015  . Elevated liver enzymes 01/09/2015  . Leukocytosis 12/19/2014  . Thrombocytopenia due to drugs 12/11/2014  . Diffuse large B-cell lymphoma of lymph nodes of multiple regions ([place]) 11/04/2014  . Anemia in neoplastic disease 11/04/2014  . Hyperlipidemia 05/11/2013  . DM (diabetes mellitus), type 2 with renal complications ([place])   . Chronic kidney disease, stage III (moderate) (HCC)   . History of tobacco abuse   . Erectile dysfunction      Prior to Admission medications   Medication Sig Start Date End Date Taking? Authorizing Provider  amLODipine (NORVASC) 10 MG tablet TAKE ONE TABLET BY MOUTH ONCE DAILY 10/26/16  Yes Jamori Biggar, PA-C  aspirin 81 MG tablet Take 81 mg by mouth daily.   Yes [provider]  fish oil-omega-3 fatty acids 1000 MG capsule Take 1 g by mouth daily.   Yes [provider]  glipiZIDE (GLUCOTROL XL) 10 MG 24 hr tablet TAKE ONE TABLET BY MOUTH ONCE DAILY 01/25/17  Yes Jordyan Hardiman, PA-C  glucose blood (GLUCOSE METER TEST) test strip Use as instructed 11/30/16  Yes Oaklen Thiam, PA-C  hydrochlorothiazide (HYDRODIURIL) 25 MG tablet Take 1 tablet (25  mg total) by mouth daily. 10/26/16  Yes Reatha Sur, PA-C  Insulin Glargine (LANTUS) 100 UNIT/ML Solostar Pen Inject 10 Units into the skin daily at 10 pm. 11/05/16  Yes Jacqulynn Cadet, Emalee Knies, PA-C  Insulin Pen  Needle 32G X 4 MM MISC Use to check home glucose as directed 11/30/16  Yes Zed Wanninger, PA-C  Lancets (ACCU-CHEK SOFT TOUCH) lancets Use as instructed 11/08/16  Yes Loy Mccartt, PA-C  losartan (COZAAR) 100 MG tablet Take 1 tablet (100 mg total) by mouth daily. 10/26/16  Yes Novaleigh Kohlman, PA-C  metoprolol tartrate (LOPRESSOR) 25 MG tablet Take 1 tablet (25 mg total) by mouth 2 (two) times daily. 01/25/17  Yes Kenesha Moshier, PA-C  pravastatin (PRAVACHOL) 40 MG tablet TAKE ONE TABLET BY MOUTH ONCE DAILY 10/26/16  Yes Gelila Well, PA-C  sitaGLIPtin (JANUVIA) 50 MG tablet Take 1 tablet (50 mg total) by mouth daily. 01/25/17  Yes Harrison Mons, PA-C     Allergies  Allergen Reactions  . Ace Inhibitors Hives  . Rituximab Other (See Comments)    This medication caused him to sweat & shake. Patient is able to tolerate non-RIR rituximab        Objective:  Physical Exam  Constitutional: He is oriented to person, place, and time. He appears well-developed and well-nourished. He is active and cooperative. No distress.  BP 120/60   Pulse (!) 58   Temp 98.1 F (36.7 C)   Resp 16   Ht 5\' 10"  (1.778 m)   Wt 168 lb 9.6 oz (76.5 kg)   SpO2 97%   BMI 24.19 kg/m   HENT:  Head: Normocephalic and atraumatic.  Right Ear: Hearing normal.  Left Ear: Hearing normal.  Eyes: Conjunctivae are normal. No scleral icterus.  Neck: Normal range of motion. Neck supple. No thyromegaly present.  Cardiovascular: Normal rate, regular rhythm and normal heart sounds.  Pulses:      Radial pulses are 2+ on the right side, and 2+ on the left side.  Pulmonary/Chest: Effort normal and breath sounds normal.  Lymphadenopathy:       Head (right side): No tonsillar, no preauricular, no posterior auricular and no occipital adenopathy present.       Head (left side): No tonsillar, no preauricular, no posterior auricular and no occipital adenopathy present.    He has no cervical adenopathy.       Right: No  supraclavicular adenopathy present.       Left: No supraclavicular adenopathy present.  Neurological: He is alert and oriented to person, place, and time. No sensory deficit.  Skin: Skin is warm, dry and intact. No rash noted. No cyanosis or erythema. Nails show no clubbing.  Psychiatric: He has a normal mood and affect. His speech is normal and behavior is normal.    Wt Readings from Last 3 Encounters:  11/02/17 168 lb 9.6 oz (76.5 kg)  08/30/17 169 lb 14.4 oz (77.1 kg)  08/02/17 166 lb 12.8 oz (75.7 kg)          Assessment & Plan:   Problem List Items Addressed This Visit    DM (diabetes mellitus), type 2 with renal complications ([place]) - Primary (Chronic)    Has been well controlled.  Continue healthy lifestyle measures.  Continue glipizide extended release 10 mg once daily, Januvia 50 mg once daily and Lantus insulin 10 units every evening.      Relevant Medications   glipiZIDE (GLUCOTROL XL) 10 MG 24 hr tablet   Insulin Glargine (LANTUS) 100 UNIT/ML  Solostar Pen   losartan (COZAAR) 100 MG tablet   pravastatin (PRAVACHOL) 40 MG tablet   sitaGLIPtin (JANUVIA) 50 MG tablet   Other Relevant Orders   Hemoglobin A1c   Microalbumin / creatinine urine ratio   Chronic kidney disease, stage III (moderate) (HCC) (Chronic)    Remains stable, GFR 37.  Stage IIIb.  Has not seen nephrology since January 2016.  Not inclined to follow-up at this time.      Relevant Orders   Comprehensive metabolic panel   Hyperlipidemia    Tolerating pravastatin 40 mg daily.  Await labs.  If LDL is greater than 70 would change to atorvastatin or rosuvastatin.      Relevant Medications   amLODipine (NORVASC) 10 MG tablet   hydrochlorothiazide (HYDRODIURIL) 25 MG tablet   losartan (COZAAR) 100 MG tablet   metoprolol tartrate (LOPRESSOR) 25 MG tablet   pravastatin (PRAVACHOL) 40 MG tablet   Other Relevant Orders   Lipid panel   Essential hypertension, benign    Well-controlled.  Continue  hydrochlorothiazide 25 mg daily, losartan 100 mg daily, metoprolol 25 mg twice daily, and amlodipine 10 mg daily.      Relevant Medications   amLODipine (NORVASC) 10 MG tablet   hydrochlorothiazide (HYDRODIURIL) 25 MG tablet   losartan (COZAAR) 100 MG tablet   metoprolol tartrate (LOPRESSOR) 25 MG tablet   pravastatin (PRAVACHOL) 40 MG tablet   Other Relevant Orders   CBC with Differential/Platelet   Hypertensive renal disease, stage 1-4 or unspecified chronic kidney disease   Relevant Medications   metoprolol tartrate (LOPRESSOR) 25 MG tablet       Return in about 3 months (around 02/02/2018) for re-evaluation of diabetes, blood pressure, cholesterol.   Fara Chute, PA-C [facility]

## 2017-11-02 NOTE — Assessment & Plan Note (Signed)
Remains stable, GFR 37.  Stage IIIb.  Has not seen nephrology since January 2016.  Not inclined to follow-up at this time.

## 2017-11-02 NOTE — Patient Instructions (Signed)
     IF you received an x-ray today, you will receive an invoice from [facility]. Please contact [facility] at [phone] with questions or concerns regarding your invoice.   IF you received labwork today, you will receive an invoice from [facility]. Please contact [facility] at [phone] with questions or concerns regarding your invoice.   Our billing staff will not be able to assist you with questions regarding bills from these companies.  You will be contacted with the lab results as soon as they are available. The fastest way to get your results is to activate your My Chart account. Instructions are located on the last page of this paperwork. If you have not heard from us regarding the results in 2 weeks, please contact this office.     

## 2017-11-02 NOTE — Assessment & Plan Note (Signed)
Tolerating pravastatin 40 mg daily.  Await labs.  If LDL is greater than 70 would change to atorvastatin or rosuvastatin.

## 2017-11-02 NOTE — Assessment & Plan Note (Addendum)
Well-controlled.  Continue hydrochlorothiazide 25 mg daily, losartan 100 mg daily, metoprolol 25 mg twice daily, and amlodipine 10 mg daily.

## 2017-11-02 NOTE — Assessment & Plan Note (Signed)
Has been well controlled.  Continue healthy lifestyle measures.  Continue glipizide extended release 10 mg once daily, Januvia 50 mg once daily and Lantus insulin 10 units every evening.

## 2017-11-04 LAB — MICROALBUMIN / CREATININE URINE RATIO
Creatinine, Urine: 87.6 mg/dL
MICROALB/CREAT RATIO: 11.2 mg/g{creat} (ref 0.0–30.0)
MICROALBUM., U, RANDOM: 9.8 ug/mL

## 2017-11-15 ENCOUNTER — Telehealth: Payer: Self-pay | Admitting: *Deleted

## 2017-11-15 NOTE — Telephone Encounter (Signed)
Pt states he did have port removed " a long time ago". Cannot remember date.

## 2017-11-15 NOTE — Telephone Encounter (Signed)
-----   Message from Heath Lark, MD sent at 11/15/2017  8:05 AM EDT ----- Regarding: port removal Did he go to general surtgery for port removbal?

## 2017-11-17 ENCOUNTER — Other Ambulatory Visit: Payer: Self-pay

## 2017-11-17 MED ORDER — ACCU-CHEK SOFT TOUCH LANCETS MISC: 3 refills | Status: AC

## 2017-12-12 ENCOUNTER — Encounter: Payer: Self-pay | Admitting: *Deleted

## 2018-02-08 ENCOUNTER — Other Ambulatory Visit: Payer: Self-pay | Admitting: Physician Assistant

## 2018-02-08 DIAGNOSIS — Z136 Encounter for screening for cardiovascular disorders: Secondary | ICD-10-CM

## 2018-02-28 ENCOUNTER — Inpatient Hospital Stay: Payer: Medicare Other | Attending: Hematology and Oncology | Admitting: Hematology and Oncology

## 2018-02-28 ENCOUNTER — Telehealth: Payer: Self-pay | Admitting: Hematology and Oncology

## 2018-02-28 DIAGNOSIS — C8338 Diffuse large B-cell lymphoma, lymph nodes of multiple sites: Secondary | ICD-10-CM

## 2018-02-28 DIAGNOSIS — N183 Chronic kidney disease, stage 3 unspecified: Secondary | ICD-10-CM

## 2018-02-28 DIAGNOSIS — Z8572 Personal history of non-Hodgkin lymphomas: Secondary | ICD-10-CM | POA: Insufficient documentation

## 2018-02-28 DIAGNOSIS — Z85118 Personal history of other malignant neoplasm of bronchus and lung: Secondary | ICD-10-CM | POA: Diagnosis present

## 2018-02-28 DIAGNOSIS — C3492 Malignant neoplasm of unspecified part of left bronchus or lung: Secondary | ICD-10-CM

## 2018-02-28 DIAGNOSIS — C3490 Malignant neoplasm of unspecified part of unspecified bronchus or lung: Secondary | ICD-10-CM

## 2018-02-28 NOTE — Telephone Encounter (Signed)
Gave avs and calendar ° °

## 2018-03-01 ENCOUNTER — Ambulatory Visit
Admission: RE | Admit: 2018-03-01 | Discharge: 2018-03-01 | Disposition: A | Discharge status: Home / Self Care | Payer: Medicare Other | Source: Ambulatory Visit | Attending: Family Medicine | Admitting: Family Medicine

## 2018-03-01 ENCOUNTER — Encounter: Payer: Self-pay | Admitting: Hematology and Oncology

## 2018-03-01 DIAGNOSIS — Z136 Encounter for screening for cardiovascular disorders: Secondary | ICD-10-CM

## 2018-03-01 NOTE — Progress Notes (Signed)
[facility] OFFICE PROGRESS NOTE  Patient Care Team: Harrison Mons, [place] as PCP - General (Family Medicine) Corliss Parish, MD as Consulting Physician (Nephrology) Fanny Skates, MD as Consulting Physician (General Surgery) Heath Lark, MD as Consulting Physician (Hematology and Oncology) Javier Glazier, MD as Consulting Physician (Pulmonary Disease) Juanita Craver, MD as Consulting Physician (Gastroenterology)  ASSESSMENT & PLAN:  Diffuse large B-cell lymphoma of lymph nodes of multiple regions [facility]) Clinically, he has no evidence of disease recurrence. I plan to see him back again in 6 months with history, physical examination and imaging studies  Adenocarcinoma of lung, stage 1, left ([place]) I review recent guidelines. He does not need adjuvant treatment. PET CT scan from 02/25/2017 and recent CT scan in January 2019 showed no evidence of disease He will continue surveillance imaging once a year  Chronic kidney disease, stage III (moderate) He has chronic kidney disease and this is unrelated to the lymphoma.  Continue close monitoring. he will continue current medical management. We discussed the importance of risk factor modification including weight loss and dietary change to control his diabetes better   Orders Placed This Encounter  Procedures  . CT Abdomen Pelvis Wo Contrast    Standing Status:   Future    Standing Expiration Date:   04/05/2019    Order Specific Question:   Preferred imaging location?    Answer:   [facility]    Order Specific Question:   Is Oral Contrast requested for this exam?    Answer:   Yes, Per Radiology protocol    Order Specific Question:   Radiology Contrast Protocol - do NOT remove file path    Answer:   \\[email or web address]  . CT Chest Wo Contrast    Standing Status:   Future    Standing Expiration Date:   04/05/2019    Order Specific Question:   Preferred imaging location?    Answer:    [facility]    Order Specific Question:   Radiology Contrast Protocol - do NOT remove file path    Answer:   \\[email or web address]  . Comprehensive metabolic panel    Standing Status:   Future    Standing Expiration Date:   04/05/2019  . CBC with Differential/Platelet    Standing Status:   Future    Standing Expiration Date:   04/05/2019  . Lactate dehydrogenase    Standing Status:   Future    Standing Expiration Date:   04/05/2019    INTERVAL HISTORY: Please see below for problem oriented charting. He returns for follow-up for lymphoma and lung cancer He feels well Denies recent cough, chest pain or shortness of breath No new lymphadenopathy  SUMMARY OF ONCOLOGIC HISTORY: Oncology History   Lymphoma   Staging form: Lymphoid Neoplasms, AJCC 6th Edition     Clinical stage from 11/21/2014: Stage IV - Signed by Heath Lark, MD on 11/21/2014       Diffuse large B-cell lymphoma of lymph nodes of multiple regions ([place])   11/13/2014 Imaging    PEt scan showed widespread disease including possible splenic involvement    11/19/2014 Surgery    He has excisional LN biopsy and port placement    11/19/2014 Pathology Results    Biopsy of lymph nodes showed DLBCL    11/29/2014 - 01/31/2015 Chemotherapy    He received 4 cycles of RCHOP chemotherapy complicated by severe infusion reaction.    12/19/2014 - 01/09/2015 Chemotherapy    He received 2 cycles of  prophylactic IT chemotherapy.    01/31/2015 Adverse Reaction    I reduce the dose of his chemotherapy, prednisone and dexamethasone due to poorly controlled diabetes and hyperglycemia    02/12/2015 Imaging     repeat PET CT scan show complete response to chemotherapy but new lung infiltrates    09/09/2015 Imaging    PET CT showed complete response except for new inflammatory change in the lung    03/01/2016 Imaging    CT chest showed mild interval growth of subsolid left upper lobe pulmonary nodule, which was  hypermetabolic on 70/35/0093 PET-CT. A primary bronchogenic adenocarcinoma cannot be excluded. Thoracic surgical consultation is advised. No lymphadenopathy or other findings of metastatic disease in the chest. Moderate emphysema with mild diffuse bronchial wall thickening, suggesting COPD. Aortic atherosclerosis. Stable small pericardial effusion/thickening.    08/30/2016 PET scan    No evidence of hypermetabolic residual or recurrent lymphoma. 2. Interval left upper lobe wedge resection for adenocarcinoma. Healing left rib fractures which are likely postsurgical. 3. Aortic atherosclerosis.    02/25/2017 PET scan    1. No abnormal nodal enlargement or hypermetabolic activity to suggest recurrent lymphoma. Postoperative findings in the left chest without findings of recurrent left lung cancer. 2. Aortic Atherosclerosis (ICD10-I70.0) and Emphysema (ICD10-J43.9).     Adenocarcinoma of lung, stage 1, left ([place])   03/24/2016 Procedure    He underwent electromagnetic navigational bronchoscopy with needle aspirations, brushings, and biopsies.    03/24/2016 Pathology Results    Accession: [id number] biopsy was positive for adenocarcinoma    04/15/2016 Initial Diagnosis    Adenocarcinoma of lung, stage 1, left ([place])    04/15/2016 Surgery    He underwent left video-assisted thoracoscopy, thoracoscopic left upper lobectomy, mediastinal lymph node dissection, on-Q local anesthetic catheter placement    04/15/2016 Pathology Results    Accession: [id number] surgical specimen revealed adenocarcinoma, 2.2 cm with negative margin and negative LN involvement    07/08/2017 Imaging    1. No evidence of local tumor recurrence in the left hemithorax status post left upper lobectomy. 2. No evidence of recurrent lymphoma. No adenopathy. Normal size spleen. 3. Solitary 5 mm solid pulmonary nodule in the superior segment right lower lobe, for which 16 month stability has been demonstrated, probably benign. 4.  Subcentimeter low-attenuation lateral segment left liver lobe lesion, for which 16 month stability has been demonstrated, probably benign. 5. No evidence of bowel obstruction or acute bowel inflammation. 6. Chronic findings include: Aortic Atherosclerosis (ICD10-I70.0) and Emphysema (ICD10-J43.9). Mild-to-moderate prostatomegaly. Small fat containing left inguinal hernia.     REVIEW OF SYSTEMS:   Constitutional: Denies fevers, chills or abnormal weight loss Eyes: Denies blurriness of vision Ears, nose, mouth, throat, and face: Denies mucositis or sore throat Respiratory: Denies cough, dyspnea or wheezes Cardiovascular: Denies palpitation, chest discomfort or lower extremity swelling Gastrointestinal:  Denies nausea, heartburn or change in bowel habits Skin: Denies abnormal skin rashes Lymphatics: Denies new lymphadenopathy or easy bruising Neurological:Denies numbness, tingling or new weaknesses Behavioral/Psych: Mood is stable, no new changes  All other systems were reviewed with the patient and are negative.  I have reviewed the past medical history, past surgical history, social history and family history with the patient and they are unchanged from previous note.  ALLERGIES:  is allergic to ace inhibitors and rituximab.  MEDICATIONS:  Current Outpatient Medications  Medication Sig Dispense Refill  . amLODipine (NORVASC) 10 MG tablet TAKE ONE TABLET BY MOUTH ONCE DAILY 90 tablet 3  . aspirin 81 MG tablet  Take 81 mg by mouth daily.    . fish oil-omega-3 fatty acids 1000 MG capsule Take 1 g by mouth daily.    Mar[place] Kitchen glipiZIDE (GLUCOTROL XL) 10 MG 24 hr tablet TAKE ONE TABLET BY MOUTH ONCE DAILY 90 tablet 3  . glucose blood (GLUCOSE METER TEST) test strip Use as instructed 100 each 3  . hydrochlorothiazide (HYDRODIURIL) 25 MG tablet Take 1 tablet (25 mg total) by mouth daily. 90 tablet 3  . Insulin Glargine (LANTUS) 100 UNIT/ML Solostar Pen Inject 10 Units into the skin daily at 10 pm. 15  mL 11  . Insulin Pen Needle 32G X 4 MM MISC Use to check home glucose as directed 100 each 3  . Lancets (ACCU-CHEK SOFT TOUCH) lancets Use as instructed 100 each 3  . losartan (COZAAR) 100 MG tablet Take 1 tablet (100 mg total) by mouth daily. 90 tablet 3  . metoprolol tartrate (LOPRESSOR) 25 MG tablet Take 1 tablet (25 mg total) by mouth 2 (two) times daily. 180 tablet 3  . pravastatin (PRAVACHOL) 40 MG tablet TAKE ONE TABLET BY MOUTH ONCE DAILY 90 tablet 3  . sitaGLIPtin (JANUVIA) 50 MG tablet Take 1 tablet (50 mg total) by mouth daily. 30 tablet 12   No current facility-administered medications for this visit.     PHYSICAL EXAMINATION: ECOG PERFORMANCE STATUS: 0 - Asymptomatic  Vitals:   02/28/18 0830  BP: (!) 157/63  Pulse: (!) 57  Resp: 18  Temp: 98 F (36.7 C)  SpO2: 100%   Filed Weights   02/28/18 0830  Weight: 174 lb (78.9 kg)    GENERAL:alert, no distress and comfortable SKIN: skin color, texture, turgor are normal, no rashes or significant lesions EYES: normal, Conjunctiva are pink and non-injected, sclera clear OROPHARYNX:no exudate, no erythema and lips, buccal mucosa, and tongue normal  NECK: supple, thyroid normal size, non-tender, without nodularity LYMPH:  no palpable lymphadenopathy in the cervical, axillary or inguinal LUNGS: clear to auscultation and percussion with normal breathing effort HEART: regular rate & rhythm and no murmurs and no lower extremity edema ABDOMEN:abdomen soft, non-tender and normal bowel sounds Musculoskeletal:no cyanosis of digits and no clubbing  NEURO: alert & oriented x 3 with fluent speech, no focal motor/sensory deficits  LABORATORY DATA:  I have reviewed the data as listed    Component Value Date/Time   NA 142 11/02/2017 0833   NA 141 06/17/2017 0938   K 4.3 11/02/2017 0833   K 4.1 06/17/2017 0938   CL 104 11/02/2017 0833   CO2 22 11/02/2017 0833   CO2 25 06/17/2017 0938   GLUCOSE 134 (H) 11/02/2017 0833   GLUCOSE  104 08/30/2017 0743   GLUCOSE 144 (H) 06/17/2017 0938   BUN 31 (H) 11/02/2017 0833   BUN 30.5 (H) 06/17/2017 0938   CREATININE 2.07 (H) 11/02/2017 0833   CREATININE 2.2 (H) 06/17/2017 0938   CALCIUM 9.3 11/02/2017 0833   CALCIUM 9.3 06/17/2017 0938   PROT 6.4 11/02/2017 0833   PROT 7.1 06/17/2017 0938   ALBUMIN 4.2 11/02/2017 0833   ALBUMIN 4.1 06/17/2017 0938   AST 23 11/02/2017 0833   AST 25 06/17/2017 0938   ALT 29 11/02/2017 0833   ALT 31 06/17/2017 0938   ALKPHOS 77 11/02/2017 0833   ALKPHOS 76 06/17/2017 0938   BILITOT 0.4 11/02/2017 0833   BILITOT 0.68 06/17/2017 0938   GFRNONAA 32 (L) 11/02/2017 0833   GFRNONAA 33 (L) 09/11/2013 0920   GFRAA 37 (L) 11/02/2017 4496  GFRAA 38 (L) 09/11/2013 0920    No results found for: SPEP, UPEP  Lab Results  Component Value Date   WBC 10.6 11/02/2017   NEUTROABS 8.1 (H) 11/02/2017   HGB 13.9 11/02/2017   HCT 41.7 11/02/2017   MCV 86 11/02/2017   PLT 241 11/02/2017      Chemistry      Component Value Date/Time   NA 142 11/02/2017 0833   NA 141 06/17/2017 0938   K 4.3 11/02/2017 0833   K 4.1 06/17/2017 0938   CL 104 11/02/2017 0833   CO2 22 11/02/2017 0833   CO2 25 06/17/2017 0938   BUN 31 (H) 11/02/2017 0833   BUN 30.5 (H) 06/17/2017 0938   CREATININE 2.07 (H) 11/02/2017 0833   CREATININE 2.2 (H) 06/17/2017 0938   GLU 382 (H) 01/31/2015 1551      Component Value Date/Time   CALCIUM 9.3 11/02/2017 0833   CALCIUM 9.3 06/17/2017 0938   ALKPHOS 77 11/02/2017 0833   ALKPHOS 76 06/17/2017 0938   AST 23 11/02/2017 0833   AST 25 06/17/2017 0938   ALT 29 11/02/2017 0833   ALT 31 06/17/2017 0938   BILITOT 0.4 11/02/2017 0833   BILITOT 0.68 06/17/2017 0938       RADIOGRAPHIC STUDIES: I have personally reviewed the radiological images as listed and agreed with the findings in the report. [place] Screening Aaa  Result Date: 03/01/2018 CLINICAL DATA:  Appropriate age.  First time exam.  Smoking history. EXAM: US  ABDOMINAL AORTA MEDICARE SCREENING TECHNIQUE: Ultrasound examination of the abdominal aorta was performed as a screening evaluation for abdominal aortic aneurysm. COMPARISON:  None. FINDINGS: Abdominal aortic measurements as follows: Proximal:  2.2 cm Mid:  2.0 cm Distal:  1.7 cm IMPRESSION: No evidence of abdominal aortic aneurysm. Electronically Signed   By: Marybelle Killings M.D.   On: 03/01/2018 08:52    All questions were answered. The patient knows to call the clinic with any problems, questions or concerns. No barriers to learning was detected.  I spent 15 minutes counseling the patient face to face. The total time spent in the appointment was 20 minutes and more than 50% was on counseling and review of test results  Heath Lark, MD 03/01/2018 1:38 PM

## 2018-03-01 NOTE — Assessment & Plan Note (Addendum)
Clinically, he has no evidence of disease recurrence. I plan to see him back again in 6 months with history, physical examination and imaging studies

## 2018-03-01 NOTE — Assessment & Plan Note (Signed)
I review recent guidelines. He does not need adjuvant treatment. PET CT scan from 02/25/2017 and recent CT scan in January 2019 showed no evidence of disease He will continue surveillance imaging once a year

## 2018-03-01 NOTE — Assessment & Plan Note (Signed)
He has chronic kidney disease and this is unrelated to the lymphoma.  Continue close monitoring. he will continue current medical management. We discussed the importance of risk factor modification including weight loss and dietary change to control his diabetes better

## 2018-07-13 ENCOUNTER — Telehealth: Payer: Self-pay

## 2018-07-13 ENCOUNTER — Ambulatory Visit (HOSPITAL_COMMUNITY)
Admission: RE | Admit: 2018-07-13 | Discharge: 2018-07-13 | Disposition: A | Discharge status: Home / Self Care | Payer: Medicare Other | Source: Ambulatory Visit | Attending: Hematology and Oncology | Admitting: Hematology and Oncology

## 2018-07-13 DIAGNOSIS — C3490 Malignant neoplasm of unspecified part of unspecified bronchus or lung: Secondary | ICD-10-CM | POA: Diagnosis present

## 2018-07-13 DIAGNOSIS — C3492 Malignant neoplasm of unspecified part of left bronchus or lung: Secondary | ICD-10-CM

## 2018-07-13 DIAGNOSIS — C8338 Diffuse large B-cell lymphoma, lymph nodes of multiple sites: Secondary | ICD-10-CM | POA: Diagnosis not present

## 2018-07-13 NOTE — Telephone Encounter (Signed)
-----   Message from Heath Lark, MD sent at 07/13/2018 10:13 AM EST ----- Regarding: labs appt Can you move his labs to be done Tuesday morning? Save him a trip

## 2018-07-13 NOTE — Telephone Encounter (Signed)
Called with below message. He wants to leave appts as scheduled.

## 2018-07-17 ENCOUNTER — Inpatient Hospital Stay: Payer: Medicare Other | Attending: Hematology and Oncology

## 2018-07-17 DIAGNOSIS — Z85118 Personal history of other malignant neoplasm of bronchus and lung: Secondary | ICD-10-CM | POA: Diagnosis present

## 2018-07-17 DIAGNOSIS — Z8572 Personal history of non-Hodgkin lymphomas: Secondary | ICD-10-CM | POA: Diagnosis present

## 2018-07-17 DIAGNOSIS — C3490 Malignant neoplasm of unspecified part of unspecified bronchus or lung: Secondary | ICD-10-CM

## 2018-07-17 DIAGNOSIS — N183 Chronic kidney disease, stage 3 (moderate): Secondary | ICD-10-CM | POA: Insufficient documentation

## 2018-07-17 DIAGNOSIS — C3492 Malignant neoplasm of unspecified part of left bronchus or lung: Secondary | ICD-10-CM

## 2018-07-17 DIAGNOSIS — C8338 Diffuse large B-cell lymphoma, lymph nodes of multiple sites: Secondary | ICD-10-CM

## 2018-07-17 LAB — LACTATE DEHYDROGENASE: LDH: 140 U/L (ref 98–192)

## 2018-07-17 LAB — CBC WITH DIFFERENTIAL/PLATELET
Abs Immature Granulocytes: 0.08 10*3/uL — ABNORMAL HIGH (ref 0.00–0.07)
BASOS PCT: 0 %
Basophils Absolute: 0.1 10*3/uL (ref 0.0–0.1)
EOS ABS: 0.2 10*3/uL (ref 0.0–0.5)
Eosinophils Relative: 2 %
HEMATOCRIT: 43.1 % (ref 39.0–52.0)
Hemoglobin: 14.3 g/dL (ref 13.0–17.0)
Immature Granulocytes: 1 %
Lymphocytes Relative: 15 %
Lymphs Abs: 1.9 10*3/uL (ref 0.7–4.0)
MCH: 28.2 pg (ref 26.0–34.0)
MCHC: 33.2 g/dL (ref 30.0–36.0)
MCV: 85 fL (ref 80.0–100.0)
MONO ABS: 0.8 10*3/uL (ref 0.1–1.0)
MONOS PCT: 6 %
Neutro Abs: 10.1 10*3/uL — ABNORMAL HIGH (ref 1.7–7.7)
Neutrophils Relative %: 76 %
PLATELETS: 226 10*3/uL (ref 150–400)
RBC: 5.07 MIL/uL (ref 4.22–5.81)
RDW: 13.2 % (ref 11.5–15.5)
WBC: 13.1 10*3/uL — ABNORMAL HIGH (ref 4.0–10.5)
nRBC: 0 % (ref 0.0–0.2)

## 2018-07-17 LAB — COMPREHENSIVE METABOLIC PANEL
ALT: 29 U/L (ref 0–44)
AST: 22 U/L (ref 15–41)
Albumin: 3.7 g/dL (ref 3.5–5.0)
Alkaline Phosphatase: 69 U/L (ref 38–126)
Anion gap: 9 (ref 5–15)
BILIRUBIN TOTAL: 0.7 mg/dL (ref 0.3–1.2)
BUN: 27 mg/dL — ABNORMAL HIGH (ref 8–23)
CHLORIDE: 106 mmol/L (ref 98–111)
CO2: 25 mmol/L (ref 22–32)
Calcium: 8.9 mg/dL (ref 8.9–10.3)
Creatinine, Ser: 2.12 mg/dL — ABNORMAL HIGH (ref 0.61–1.24)
GFR, EST AFRICAN AMERICAN: 36 mL/min — AB (ref >60)
GFR, EST NON AFRICAN AMERICAN: 31 mL/min — AB (ref >60)
Glucose, Bld: 152 mg/dL — ABNORMAL HIGH (ref 70–99)
Potassium: 4.2 mmol/L (ref 3.5–5.1)
Sodium: 140 mmol/L (ref 135–145)
TOTAL PROTEIN: 6.2 g/dL — AB (ref 6.5–8.1)

## 2018-07-18 ENCOUNTER — Telehealth: Payer: Self-pay | Admitting: Hematology and Oncology

## 2018-07-18 ENCOUNTER — Encounter: Payer: Self-pay | Admitting: Hematology and Oncology

## 2018-07-18 ENCOUNTER — Inpatient Hospital Stay: Payer: Medicare Other | Admitting: Hematology and Oncology

## 2018-07-18 DIAGNOSIS — N183 Chronic kidney disease, stage 3 unspecified: Secondary | ICD-10-CM

## 2018-07-18 DIAGNOSIS — C3492 Malignant neoplasm of unspecified part of left bronchus or lung: Secondary | ICD-10-CM

## 2018-07-18 DIAGNOSIS — C8338 Diffuse large B-cell lymphoma, lymph nodes of multiple sites: Secondary | ICD-10-CM

## 2018-07-18 DIAGNOSIS — Z85118 Personal history of other malignant neoplasm of bronchus and lung: Secondary | ICD-10-CM | POA: Diagnosis not present

## 2018-07-18 DIAGNOSIS — Z8572 Personal history of non-Hodgkin lymphomas: Secondary | ICD-10-CM | POA: Diagnosis not present

## 2018-07-18 NOTE — Assessment & Plan Note (Signed)
I have reviewed his CT imaging which showed no evidence of disease He does not need adjuvant treatment I recommend discontinuation of routine surveillance imaging study with CT imaging I will order x-ray of his chest only in his next visit

## 2018-07-18 NOTE — Assessment & Plan Note (Signed)
He has chronic kidney disease and this is unrelated to the lymphoma.  Continue close monitoring. he will continue current medical management. We discussed the importance of risk factor modification including weight loss and dietary change to control his diabetes better

## 2018-07-18 NOTE — Telephone Encounter (Signed)
Gave avs and calendar ° °

## 2018-07-18 NOTE — Assessment & Plan Note (Signed)
Clinically, he has no evidence of disease recurrence. The patient is more than 2 years out from his prior chemo I do not recommend routine surveillance imaging study anymore moving forward I will see him back in a year with history, physical examination and blood work

## 2018-07-18 NOTE — Progress Notes (Signed)
[facility] OFFICE PROGRESS NOTE  Patient Care Team: Harrison Mons, [place] as PCP - General (Family Medicine) Corliss Parish, MD as Consulting Physician (Nephrology) Fanny Skates, MD as Consulting Physician (General Surgery) Heath Lark, MD as Consulting Physician (Hematology and Oncology) Javier Glazier, MD as Consulting Physician (Pulmonary Disease) Juanita Craver, MD as Consulting Physician (Gastroenterology)  ASSESSMENT & PLAN:  Adenocarcinoma of lung, stage 1, left ([place]) I have reviewed his CT imaging which showed no evidence of disease He does not need adjuvant treatment I recommend discontinuation of routine surveillance imaging study with CT imaging I will order x-ray of his chest only in his next visit  Diffuse large B-cell lymphoma of lymph nodes of multiple regions [facility]) Clinically, he has no evidence of disease recurrence. The patient is more than 2 years out from his prior chemo I do not recommend routine surveillance imaging study anymore moving forward I will see him back in a year with history, physical examination and blood work  Chronic kidney disease, stage III (moderate) He has chronic kidney disease and this is unrelated to the lymphoma.  Continue close monitoring. he will continue current medical management. We discussed the importance of risk factor modification including weight loss and dietary change to control his diabetes better   Orders Placed This Encounter  Procedures  . DG Chest 2 View    Standing Status:   Future    Standing Expiration Date:   08/22/2019    Order Specific Question:   Reason for exam:    Answer:   luung ca history    Order Specific Question:   Preferred imaging location?    Answer:   [facility]  . CBC with Differential/Platelet    Standing Status:   Future    Standing Expiration Date:   08/22/2019  . Comprehensive metabolic panel    Standing Status:   Future    Standing Expiration Date:    08/22/2019    INTERVAL HISTORY: Please see below for problem oriented charting. He returns for further follow-up He feels well No new lymphadenopathy Denies recent cough, chest pain or shortness of breath His appetite has been stable  SUMMARY OF ONCOLOGIC HISTORY: Oncology History   Lymphoma   Staging form: Lymphoid Neoplasms, AJCC 6th Edition     Clinical stage from 11/21/2014: Stage IV - Signed by Heath Lark, MD on 11/21/2014       Diffuse large B-cell lymphoma of lymph nodes of multiple regions ([place])   11/13/2014 Imaging    PEt scan showed widespread disease including possible splenic involvement    11/19/2014 Surgery    He has excisional LN biopsy and port placement    11/19/2014 Pathology Results    Biopsy of lymph nodes showed DLBCL    11/29/2014 - 01/31/2015 Chemotherapy    He received 4 cycles of RCHOP chemotherapy complicated by severe infusion reaction.    12/19/2014 - 01/09/2015 Chemotherapy    He received 2 cycles of prophylactic IT chemotherapy.    01/31/2015 Adverse Reaction    I reduce the dose of his chemotherapy, prednisone and dexamethasone due to poorly controlled diabetes and hyperglycemia    02/12/2015 Imaging     repeat PET CT scan show complete response to chemotherapy but new lung infiltrates    09/09/2015 Imaging    PET CT showed complete response except for new inflammatory change in the lung    03/01/2016 Imaging    CT chest showed mild interval growth of subsolid left upper  lobe pulmonary nodule, which was hypermetabolic on 48/18/5631 PET-CT. A primary bronchogenic adenocarcinoma cannot be excluded. Thoracic surgical consultation is advised. No lymphadenopathy or other findings of metastatic disease in the chest. Moderate emphysema with mild diffuse bronchial wall thickening, suggesting COPD. Aortic atherosclerosis. Stable small pericardial effusion/thickening.    08/30/2016 PET scan    No evidence of hypermetabolic residual or recurrent lymphoma. 2.  Interval left upper lobe wedge resection for adenocarcinoma. Healing left rib fractures which are likely postsurgical. 3. Aortic atherosclerosis.    02/25/2017 PET scan    1. No abnormal nodal enlargement or hypermetabolic activity to suggest recurrent lymphoma. Postoperative findings in the left chest without findings of recurrent left lung cancer. 2. Aortic Atherosclerosis (ICD10-I70.0) and Emphysema (ICD10-J43.9).     Adenocarcinoma of lung, stage 1, left ([place])   03/24/2016 Procedure    He underwent electromagnetic navigational bronchoscopy with needle aspirations, brushings, and biopsies.    03/24/2016 Pathology Results    Accession: [id number] biopsy was positive for adenocarcinoma    04/15/2016 Initial Diagnosis    Adenocarcinoma of lung, stage 1, left ([place])    04/15/2016 Surgery    He underwent left video-assisted thoracoscopy, thoracoscopic left upper lobectomy, mediastinal lymph node dissection, on-Q local anesthetic catheter placement    04/15/2016 Pathology Results    Accession: [id number] surgical specimen revealed adenocarcinoma, 2.2 cm with negative margin and negative LN involvement    07/08/2017 Imaging    1. No evidence of local tumor recurrence in the left hemithorax status post left upper lobectomy. 2. No evidence of recurrent lymphoma. No adenopathy. Normal size spleen. 3. Solitary 5 mm solid pulmonary nodule in the superior segment right lower lobe, for which 16 month stability has been demonstrated, probably benign. 4. Subcentimeter low-attenuation lateral segment left liver lobe lesion, for which 16 month stability has been demonstrated, probably benign. 5. No evidence of bowel obstruction or acute bowel inflammation. 6. Chronic findings include: Aortic Atherosclerosis (ICD10-I70.0) and Emphysema (ICD10-J43.9). Mild-to-moderate prostatomegaly. Small fat containing left inguinal hernia.    07/13/2018 Imaging    1. Status post left upper lobectomy, without recurrent  or metastatic disease. 2. Right axillary node dissection, without evidence of active lymphoma. 3. Aortic atherosclerosis (ICD10-I70.0) and emphysema (ICD10-J43.9). 4. Right lower lobe pulmonary nodule is similar and can be presumed benign.     REVIEW OF SYSTEMS:   Constitutional: Denies fevers, chills or abnormal weight loss Eyes: Denies blurriness of vision Ears, nose, mouth, throat, and face: Denies mucositis or sore throat Respiratory: Denies cough, dyspnea or wheezes Cardiovascular: Denies palpitation, chest discomfort or lower extremity swelling Gastrointestinal:  Denies nausea, heartburn or change in bowel habits Skin: Denies abnormal skin rashes Lymphatics: Denies new lymphadenopathy or easy bruising Neurological:Denies numbness, tingling or new weaknesses Behavioral/Psych: Mood is stable, no new changes  All other systems were reviewed with the patient and are negative.  I have reviewed the past medical history, past surgical history, social history and family history with the patient and they are unchanged from previous note.  ALLERGIES:  is allergic to ace inhibitors and rituximab.  MEDICATIONS:  Current Outpatient Medications  Medication Sig Dispense Refill  . amLODipine (NORVASC) 10 MG tablet TAKE ONE TABLET BY MOUTH ONCE DAILY 90 tablet 3  . aspirin 81 MG tablet Take 81 mg by mouth daily.    . fish oil-omega-3 fatty acids 1000 MG capsule Take 1 g by mouth daily.    Marland Kitchen glipiZIDE (GLUCOTROL XL) 10 MG 24 hr tablet TAKE ONE TABLET BY MOUTH ONCE  DAILY 90 tablet 3  . glucose blood (GLUCOSE METER TEST) test strip Use as instructed 100 each 3  . hydrochlorothiazide (HYDRODIURIL) 25 MG tablet Take 1 tablet (25 mg total) by mouth daily. 90 tablet 3  . Insulin Glargine (LANTUS) 100 UNIT/ML Solostar Pen Inject 10 Units into the skin daily at 10 pm. 15 mL 11  . Insulin Pen Needle 32G X 4 MM MISC Use to check home glucose as directed 100 each 3  . Lancets (ACCU-CHEK SOFT TOUCH)  lancets Use as instructed 100 each 3  . losartan (COZAAR) 100 MG tablet Take 1 tablet (100 mg total) by mouth daily. 90 tablet 3  . metoprolol tartrate (LOPRESSOR) 25 MG tablet Take 1 tablet (25 mg total) by mouth 2 (two) times daily. 180 tablet 3  . pravastatin (PRAVACHOL) 40 MG tablet TAKE ONE TABLET BY MOUTH ONCE DAILY 90 tablet 3  . sitaGLIPtin (JANUVIA) 50 MG tablet Take 1 tablet (50 mg total) by mouth daily. 30 tablet 12   No current facility-administered medications for this visit.     PHYSICAL EXAMINATION: ECOG PERFORMANCE STATUS: 0 - Asymptomatic  Vitals:   07/18/18 0852  BP: (!) 156/82  Pulse: 64  Resp: 18  Temp: 98 F (36.7 C)  SpO2: 99%   Filed Weights   07/18/18 0852  Weight: 172 lb 12.8 oz (78.4 kg)    GENERAL:alert, no distress and comfortable SKIN: skin color, texture, turgor are normal, no rashes or significant lesions EYES: normal, Conjunctiva are pink and non-injected, sclera clear OROPHARYNX:no exudate, no erythema and lips, buccal mucosa, and tongue normal  NECK: supple, thyroid normal size, non-tender, without nodularity LYMPH:  no palpable lymphadenopathy in the cervical, axillary or inguinal LUNGS: clear to auscultation and percussion with normal breathing effort HEART: regular rate & rhythm and no murmurs and no lower extremity edema ABDOMEN:abdomen soft, non-tender and normal bowel sounds Musculoskeletal:no cyanosis of digits and no clubbing  NEURO: alert & oriented x 3 with fluent speech, no focal motor/sensory deficits  LABORATORY DATA:  I have reviewed the data as listed    Component Value Date/Time   NA 140 07/17/2018 0800   NA 142 11/02/2017 0833   NA 141 06/17/2017 0938   K 4.2 07/17/2018 0800   K 4.1 06/17/2017 0938   CL 106 07/17/2018 0800   CO2 25 07/17/2018 0800   CO2 25 06/17/2017 0938   GLUCOSE 152 (H) 07/17/2018 0800   GLUCOSE 144 (H) 06/17/2017 0938   BUN 27 (H) 07/17/2018 0800   BUN 31 (H) 11/02/2017 0833   BUN 30.5 (H)  06/17/2017 0938   CREATININE 2.12 (H) 07/17/2018 0800   CREATININE 2.2 (H) 06/17/2017 0938   CALCIUM 8.9 07/17/2018 0800   CALCIUM 9.3 06/17/2017 0938   PROT 6.2 (L) 07/17/2018 0800   PROT 6.4 11/02/2017 0833   PROT 7.1 06/17/2017 0938   ALBUMIN 3.7 07/17/2018 0800   ALBUMIN 4.2 11/02/2017 0833   ALBUMIN 4.1 06/17/2017 0938   AST 22 07/17/2018 0800   AST 25 06/17/2017 0938   ALT 29 07/17/2018 0800   ALT 31 06/17/2017 0938   ALKPHOS 69 07/17/2018 0800   ALKPHOS 76 06/17/2017 0938   BILITOT 0.7 07/17/2018 0800   BILITOT 0.4 11/02/2017 0833   BILITOT 0.68 06/17/2017 0938   GFRNONAA 31 (L) 07/17/2018 0800   GFRNONAA 33 (L) 09/11/2013 0920   GFRAA 36 (L) 07/17/2018 0800   GFRAA 38 (L) 09/11/2013 0920    No results found for: SPEP, UPEP  Lab Results  Component Value Date   WBC 13.1 (H) 07/17/2018   NEUTROABS 10.1 (H) 07/17/2018   HGB 14.3 07/17/2018   HCT 43.1 07/17/2018   MCV 85.0 07/17/2018   PLT 226 07/17/2018      Chemistry      Component Value Date/Time   NA 140 07/17/2018 0800   NA 142 11/02/2017 0833   NA 141 06/17/2017 0938   K 4.2 07/17/2018 0800   K 4.1 06/17/2017 0938   CL 106 07/17/2018 0800   CO2 25 07/17/2018 0800   CO2 25 06/17/2017 0938   BUN 27 (H) 07/17/2018 0800   BUN 31 (H) 11/02/2017 0833   BUN 30.5 (H) 06/17/2017 0938   CREATININE 2.12 (H) 07/17/2018 0800   CREATININE 2.2 (H) 06/17/2017 0938   GLU 382 (H) 01/31/2015 1551      Component Value Date/Time   CALCIUM 8.9 07/17/2018 0800   CALCIUM 9.3 06/17/2017 0938   ALKPHOS 69 07/17/2018 0800   ALKPHOS 76 06/17/2017 0938   AST 22 07/17/2018 0800   AST 25 06/17/2017 0938   ALT 29 07/17/2018 0800   ALT 31 06/17/2017 0938   BILITOT 0.7 07/17/2018 0800   BILITOT 0.4 11/02/2017 0833   BILITOT 0.68 06/17/2017 0938       RADIOGRAPHIC STUDIES: I have personally reviewed the radiological images as listed and agreed with the findings in the report. Ct Abdomen Pelvis Wo Contrast  Result  Date: 07/13/2018 CLINICAL DATA:  Non-small-cell lung cancer and lymphoma. Left upper lobectomy 04/14/2016. Biopsy of right axillary node 11/19/2014. COPD. Renal insufficiency. EXAM: CT CHEST, ABDOMEN AND PELVIS WITHOUT CONTRAST TECHNIQUE: Multidetector CT imaging of the chest, abdomen and pelvis was performed following the standard protocol without IV contrast. COMPARISON:  07/08/2016. FINDINGS: CT CHEST FINDINGS Cardiovascular: Aortic atherosclerosis. Normal heart size, without pericardial effusion. Mediastinum/Nodes: No supraclavicular adenopathy. Right axillary node dissection, without adenopathy. No mediastinal or definite hilar adenopathy, given limitations of unenhanced CT. Lungs/Pleura: No pleural fluid. Left upper lobectomy. Moderate bullous type emphysema. 5 mm superior segment right lower lobe pulmonary nodule is similar on image 72/6 and can be presumed benign. Musculoskeletal: No acute osseous abnormality. CT ABDOMEN PELVIS FINDINGS Hepatobiliary: Suspicion of a subcentimeter lateral segment left liver lobe low-density lesion, similar and therefore benign. Normal gallbladder, without biliary ductal dilatation. Pancreas: Normal, without mass or ductal dilatation. Spleen: Normal in size, without focal abnormality. Adrenals/Urinary Tract: Normal adrenal glands. Mild renal cortical thinning bilaterally. No bladder calculi. Stomach/Bowel: Normal stomach, without wall thickening. Normal colon, appendix, and terminal ileum. Normal small bowel. Vascular/Lymphatic: Aortic and branch vessel atherosclerosis. No abdominopelvic adenopathy. Reproductive: Normal prostate. Other: Small fat containing left inguinal hernia. No significant free fluid. No evidence of omental or peritoneal disease. 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 Bilateral hip osteoarthritis. IMPRESSION: 1. Status post left upper lobectomy, without recurrent or metastatic disease. 2. Right axillary node  dissection, without evidence of active lymphoma. 3. Aortic atherosclerosis (ICD10-I70.0) and emphysema (ICD10-J43.9). 4. Right lower lobe pulmonary nodule is similar and can be presumed benign. Electronically Signed   By: Abigail Miyamoto M.D.   On: 07/13/2018 09:57   Ct Chest Wo Contrast  Result Date: 07/13/2018 CLINICAL DATA:  Non-small-cell lung cancer and lymphoma. Left upper lobectomy 04/14/2016. Biopsy of right axillary node 11/19/2014. COPD. Renal insufficiency. EXAM: CT CHEST, ABDOMEN AND PELVIS WITHOUT CONTRAST TECHNIQUE: Multidetector CT imaging of the chest, abdomen and pelvis was performed following the standard protocol without IV contrast. COMPARISON:  07/08/2016. FINDINGS:  CT CHEST FINDINGS Cardiovascular: Aortic atherosclerosis. Normal heart size, without pericardial effusion. Mediastinum/Nodes: No supraclavicular adenopathy. Right axillary node dissection, without adenopathy. No mediastinal or definite hilar adenopathy, given limitations of unenhanced CT. Lungs/Pleura: No pleural fluid. Left upper lobectomy. Moderate bullous type emphysema. 5 mm superior segment right lower lobe pulmonary nodule is similar on image 72/6 and can be presumed benign. Musculoskeletal: No acute osseous abnormality. CT ABDOMEN PELVIS FINDINGS Hepatobiliary: Suspicion of a subcentimeter lateral segment left liver lobe low-density lesion, similar and therefore benign. Normal gallbladder, without biliary ductal dilatation. Pancreas: Normal, without mass or ductal dilatation. Spleen: Normal in size, without focal abnormality. Adrenals/Urinary Tract: Normal adrenal glands. Mild renal cortical thinning bilaterally. No bladder calculi. Stomach/Bowel: Normal stomach, without wall thickening. Normal colon, appendix, and terminal ileum. Normal small bowel. Vascular/Lymphatic: Aortic and branch vessel atherosclerosis. No abdominopelvic adenopathy. Reproductive: Normal prostate. Other: Small fat containing left inguinal hernia. No  significant free fluid. No evidence of omental or peritoneal disease. 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 Bilateral hip osteoarthritis. IMPRESSION: 1. Status post left upper lobectomy, without recurrent or metastatic disease. 2. Right axillary node dissection, without evidence of active lymphoma. 3. Aortic atherosclerosis (ICD10-I70.0) and emphysema (ICD10-J43.9). 4. Right lower lobe pulmonary nodule is similar and can be presumed benign. Electronically Signed   By: Abigail Miyamoto M.D.   On: 07/13/2018 09:57    All questions were answered. The patient knows to call the clinic with any problems, questions or concerns. No barriers to learning was detected.  I spent 15 minutes counseling the patient face to face. The total time spent in the appointment was 20 minutes and more than 50% was on counseling and review of test results  Heath Lark, MD 07/18/2018 9:15 AM

## 2019-06-25 ENCOUNTER — Ambulatory Visit (HOSPITAL_COMMUNITY)
Admission: RE | Admit: 2019-06-25 | Discharge: 2019-06-25 | Disposition: A | Discharge status: Home / Self Care | Payer: Medicare Other | Source: Ambulatory Visit | Attending: Hematology and Oncology | Admitting: Hematology and Oncology

## 2019-06-25 ENCOUNTER — Other Ambulatory Visit: Payer: Self-pay

## 2019-06-25 DIAGNOSIS — C3492 Malignant neoplasm of unspecified part of left bronchus or lung: Secondary | ICD-10-CM | POA: Insufficient documentation

## 2019-07-06 ENCOUNTER — Other Ambulatory Visit: Payer: Self-pay

## 2019-07-06 ENCOUNTER — Emergency Department (HOSPITAL_COMMUNITY): Payer: Medicare Other

## 2019-07-06 ENCOUNTER — Encounter (HOSPITAL_COMMUNITY): Payer: Self-pay | Admitting: Emergency Medicine

## 2019-07-06 ENCOUNTER — Inpatient Hospital Stay (HOSPITAL_COMMUNITY)
Admission: EM | Admit: 2019-07-06 | Discharge: 2019-07-08 | DRG: 603 | Disposition: A | Discharge status: Home / Self Care | Payer: Medicare Other | Attending: Family Medicine | Admitting: Family Medicine

## 2019-07-06 DIAGNOSIS — Z20822 Contact with and (suspected) exposure to covid-19: Secondary | ICD-10-CM | POA: Diagnosis present

## 2019-07-06 DIAGNOSIS — Z8572 Personal history of non-Hodgkin lymphomas: Secondary | ICD-10-CM

## 2019-07-06 DIAGNOSIS — Z794 Long term (current) use of insulin: Secondary | ICD-10-CM

## 2019-07-06 DIAGNOSIS — Z902 Acquired absence of lung [part of]: Secondary | ICD-10-CM

## 2019-07-06 DIAGNOSIS — L03114 Cellulitis of left upper limb: Principal | ICD-10-CM | POA: Diagnosis present

## 2019-07-06 DIAGNOSIS — E785 Hyperlipidemia, unspecified: Secondary | ICD-10-CM | POA: Diagnosis present

## 2019-07-06 DIAGNOSIS — Z800 Family history of malignant neoplasm of digestive organs: Secondary | ICD-10-CM

## 2019-07-06 DIAGNOSIS — N184 Chronic kidney disease, stage 4 (severe): Secondary | ICD-10-CM | POA: Diagnosis present

## 2019-07-06 DIAGNOSIS — Z823 Family history of stroke: Secondary | ICD-10-CM

## 2019-07-06 DIAGNOSIS — I129 Hypertensive chronic kidney disease with stage 1 through stage 4 chronic kidney disease, or unspecified chronic kidney disease: Secondary | ICD-10-CM | POA: Diagnosis present

## 2019-07-06 DIAGNOSIS — Z7982 Long term (current) use of aspirin: Secondary | ICD-10-CM

## 2019-07-06 DIAGNOSIS — E1122 Type 2 diabetes mellitus with diabetic chronic kidney disease: Secondary | ICD-10-CM | POA: Diagnosis present

## 2019-07-06 DIAGNOSIS — M778 Other enthesopathies, not elsewhere classified: Secondary | ICD-10-CM | POA: Diagnosis present

## 2019-07-06 DIAGNOSIS — M19022 Primary osteoarthritis, left elbow: Secondary | ICD-10-CM | POA: Diagnosis present

## 2019-07-06 DIAGNOSIS — Z87891 Personal history of nicotine dependence: Secondary | ICD-10-CM

## 2019-07-06 DIAGNOSIS — M7989 Other specified soft tissue disorders: Secondary | ICD-10-CM

## 2019-07-06 DIAGNOSIS — E1100 Type 2 diabetes mellitus with hyperosmolarity without nonketotic hyperglycemic-hyperosmolar coma (NKHHC): Secondary | ICD-10-CM

## 2019-07-06 DIAGNOSIS — Z85118 Personal history of other malignant neoplasm of bronchus and lung: Secondary | ICD-10-CM

## 2019-07-06 DIAGNOSIS — Z803 Family history of malignant neoplasm of breast: Secondary | ICD-10-CM

## 2019-07-06 LAB — MAGNESIUM: Magnesium: 2.1 mg/dL (ref 1.7–2.4)

## 2019-07-06 LAB — CBC
HCT: 42.9 % (ref 39.0–52.0)
Hemoglobin: 14.1 g/dL (ref 13.0–17.0)
MCH: 28.3 pg (ref 26.0–34.0)
MCHC: 32.9 g/dL (ref 30.0–36.0)
MCV: 86.1 fL (ref 80.0–100.0)
Platelets: 277 10*3/uL (ref 150–400)
RBC: 4.98 MIL/uL (ref 4.22–5.81)
RDW: 13.2 % (ref 11.5–15.5)
WBC: 17.7 10*3/uL — ABNORMAL HIGH (ref 4.0–10.5)
nRBC: 0 % (ref 0.0–0.2)

## 2019-07-06 LAB — BASIC METABOLIC PANEL
Anion gap: 12 (ref 5–15)
BUN: 35 mg/dL — ABNORMAL HIGH (ref 8–23)
CO2: 22 mmol/L (ref 22–32)
Calcium: 8.8 mg/dL — ABNORMAL LOW (ref 8.9–10.3)
Chloride: 102 mmol/L (ref 98–111)
Creatinine, Ser: 2.43 mg/dL — ABNORMAL HIGH (ref 0.61–1.24)
GFR calc Af Amer: 30 mL/min — ABNORMAL LOW (ref >60)
GFR calc non Af Amer: 26 mL/min — ABNORMAL LOW (ref >60)
Glucose, Bld: 189 mg/dL — ABNORMAL HIGH (ref 70–99)
Potassium: 4.1 mmol/L (ref 3.5–5.1)
Sodium: 136 mmol/L (ref 135–145)

## 2019-07-06 LAB — GLUCOSE, CAPILLARY: Glucose-Capillary: 292 mg/dL — ABNORMAL HIGH (ref 70–99)

## 2019-07-06 LAB — TROPONIN I (HIGH SENSITIVITY)
Troponin I (High Sensitivity): 7 ng/L (ref <18)
Troponin I (High Sensitivity): 11 ng/L (ref <18)

## 2019-07-06 LAB — RESPIRATORY PANEL BY RT PCR (FLU A&B, COVID)
Influenza A by PCR: NEGATIVE
Influenza B by PCR: NEGATIVE
SARS Coronavirus 2 by RT PCR: NEGATIVE

## 2019-07-06 LAB — CBG MONITORING, ED: Glucose-Capillary: 169 mg/dL — ABNORMAL HIGH (ref 70–99)

## 2019-07-06 MED ORDER — POLYETHYLENE GLYCOL 3350 17 G PO PACK: 17.0000 g | PACK | Freq: Every day | ORAL | Status: DC | PRN

## 2019-07-06 MED ORDER — HYDROCHLOROTHIAZIDE 25 MG PO TABS
25.0000 mg | ORAL_TABLET | Freq: Every day | ORAL | Status: DC
  Administered 2019-07-07 – 2019-07-08 (×2): 25 mg via ORAL
  Filled 2019-07-06 (×3): qty 1

## 2019-07-06 MED ORDER — LOSARTAN POTASSIUM 50 MG PO TABS
100.0000 mg | ORAL_TABLET | Freq: Every day | ORAL | Status: DC
  Administered 2019-07-07 – 2019-07-08 (×2): 100 mg via ORAL
  Filled 2019-07-06 (×3): qty 2

## 2019-07-06 MED ORDER — INSULIN GLARGINE 100 UNIT/ML ~~LOC~~ SOLN
10.0000 [IU] | Freq: Every day | SUBCUTANEOUS | Status: DC
  Filled 2019-07-06: qty 0.1

## 2019-07-06 MED ORDER — INSULIN GLARGINE 100 UNIT/ML ~~LOC~~ SOLN
5.0000 [IU] | Freq: Every day | SUBCUTANEOUS | Status: DC
  Administered 2019-07-06: 5 [IU] via SUBCUTANEOUS
  Filled 2019-07-06 (×3): qty 0.05

## 2019-07-06 MED ORDER — AMLODIPINE BESYLATE 10 MG PO TABS
10.0000 mg | ORAL_TABLET | Freq: Every day | ORAL | Status: DC
  Administered 2019-07-07 – 2019-07-08 (×2): 10 mg via ORAL
  Filled 2019-07-06 (×3): qty 1

## 2019-07-06 MED ORDER — INSULIN ASPART 100 UNIT/ML ~~LOC~~ SOLN
0.0000 [IU] | Freq: Three times a day (TID) | SUBCUTANEOUS | Status: DC
  Administered 2019-07-07: 3 [IU] via SUBCUTANEOUS
  Administered 2019-07-07 – 2019-07-08 (×2): 1 [IU] via SUBCUTANEOUS
  Administered 2019-07-08: 2 [IU] via SUBCUTANEOUS

## 2019-07-06 MED ORDER — ENOXAPARIN SODIUM 30 MG/0.3ML ~~LOC~~ SOLN
30.0000 mg | SUBCUTANEOUS | Status: DC
  Administered 2019-07-06 – 2019-07-07 (×2): 30 mg via SUBCUTANEOUS
  Filled 2019-07-06 (×2): qty 0.3

## 2019-07-06 MED ORDER — CLINDAMYCIN PHOSPHATE 600 MG/50ML IV SOLN
600.0000 mg | Freq: Once | INTRAVENOUS | Status: AC
  Administered 2019-07-06: 600 mg via INTRAVENOUS
  Filled 2019-07-06: qty 50

## 2019-07-06 MED ORDER — ACETAMINOPHEN 325 MG PO TABS
650.0000 mg | ORAL_TABLET | Freq: Four times a day (QID) | ORAL | Status: DC | PRN
  Filled 2019-07-06: qty 2

## 2019-07-06 MED ORDER — SODIUM CHLORIDE 0.9% FLUSH
3.0000 mL | Freq: Once | INTRAVENOUS | Status: AC
  Administered 2019-07-06: 3 mL via INTRAVENOUS

## 2019-07-06 MED ORDER — CLINDAMYCIN PHOSPHATE 600 MG/50ML IV SOLN
600.0000 mg | Freq: Three times a day (TID) | INTRAVENOUS | Status: DC
  Administered 2019-07-06 – 2019-07-08 (×5): 600 mg via INTRAVENOUS
  Filled 2019-07-06 (×5): qty 50

## 2019-07-06 MED ORDER — ADULT MULTIVITAMIN W/MINERALS CH
1.0000 | ORAL_TABLET | Freq: Every day | ORAL | Status: DC
  Administered 2019-07-07 – 2019-07-08 (×2): 1 via ORAL
  Filled 2019-07-06 (×3): qty 1

## 2019-07-06 MED ORDER — ASPIRIN EC 81 MG PO TBEC
81.0000 mg | DELAYED_RELEASE_TABLET | Freq: Every day | ORAL | Status: DC
  Administered 2019-07-07 – 2019-07-08 (×2): 81 mg via ORAL
  Filled 2019-07-06 (×3): qty 1

## 2019-07-06 MED ORDER — ACETAMINOPHEN 650 MG RE SUPP: 650.0000 mg | Freq: Four times a day (QID) | RECTAL | Status: DC | PRN

## 2019-07-06 MED ORDER — METOPROLOL TARTRATE 25 MG PO TABS
25.0000 mg | ORAL_TABLET | Freq: Two times a day (BID) | ORAL | Status: DC
  Administered 2019-07-06 – 2019-07-08 (×4): 25 mg via ORAL
  Filled 2019-07-06 (×4): qty 1

## 2019-07-06 MED ORDER — PRAVASTATIN SODIUM 40 MG PO TABS
40.0000 mg | ORAL_TABLET | Freq: Every day | ORAL | Status: DC
  Administered 2019-07-06 – 2019-07-08 (×3): 40 mg via ORAL
  Filled 2019-07-06 (×4): qty 1

## 2019-07-06 NOTE — ED Provider Notes (Signed)
[facility] EMERGENCY DEPARTMENT Provider Note   CSN: [id number] Arrival date & time: 07/06/19  1213     History Chief Complaint  Patient presents with  . Arm Swelling    [name] [name] is a 69 y.o. male.  Patient is a 69 year old gentleman with a history of chronic kidney disease, diabetes, hypertension who is presenting today with 4 days of worsening left elbow pain and swelling.  Patient states maybe 1 day before the symptoms started he was doing weight lifting but denies any pain or injury during the event.  Over the last 4 days he has noticed worsening pain, mild redness, heat and significant swelling over the left elbow into the upper forearm.  He is now unable to completely extend his left elbow due to the pain and swelling.  He denies any generalized malaise, fever, myalgias or other ill symptoms.  He has no chest pain, shortness of breath and feels that his other medical problems are currently at baseline.  He has not taken any medication for the pain and denies any injury or prolonged pressure on the area.  No prior history of similar.  The history is provided by the patient.       Past Medical History:  Diagnosis Date  . Anemia   . Cancer ([place]) 2016   lymphoma  . Chronic kidney disease (CKD), stage III (moderate)   . Chronic kidney disease, stage III (moderate)   . Cramps, extremity    hx of in legs bilat; currently having issues in toes bilat   . Diabetes ([place])   . Erectile dysfunction   . Essential hypertension, benign   . History of ETOH abuse    quit 1999  . History of tobacco abuse    quit 12/2008  . Hyperparathyroidism ([place])    secondary / renal per H&P with [facility] 07/17/2014   . Pulmonary infiltrate present on computed tomography 02/20/2015  . Shortness of breath dyspnea    exertion  . Type II or unspecified type diabetes mellitus with renal manifestations, not stated as uncontrolled(250.40)     Patient Active Problem List   Diagnosis Date Noted  . Hypertensive renal disease, stage 1-4 or unspecified chronic kidney disease 04/29/2017  . COPD (chronic obstructive pulmonary disease) with emphysema ([place]) 10/26/2016  . Incisional pain 07/02/2016  . Adenocarcinoma of lung, stage 1, left ([place]) 04/15/2016  . Essential hypertension, benign 03/02/2016  . Port catheter in place 10/31/2015  . Nodule of left lung 09/11/2015  . Restrictive lung disease 04/23/2015  . Reactive thrombocytosis 01/09/2015  . Elevated liver enzymes 01/09/2015  . Leukocytosis 12/19/2014  . Thrombocytopenia due to drugs 12/11/2014  . Diffuse large B-cell lymphoma of lymph nodes of multiple regions ([place]) 11/04/2014  . Anemia in neoplastic disease 11/04/2014  . Hyperlipidemia 05/11/2013  . DM (diabetes mellitus), type 2 with renal complications ([place])   . Chronic kidney disease, stage III (moderate)   . History of tobacco abuse   . Erectile dysfunction     Past Surgical History:  Procedure Laterality Date  . ARM WOUND REPAIR / CLOSURE     chainsaw; left forearm/wrist  . AXILLARY LYMPH NODE BIOPSY Right 11/19/2014   Procedure: EXCISIONAL BIOPSY DEEP RIGHT AXILLARY LYMPH NODE ;  Surgeon: Fanny Skates, MD;  Location: [facility];  Service: General;  Laterality: Right;  . colonscopy     . PORTACATH PLACEMENT N/A 11/19/2014   Procedure: INSERTION PORT-A-CATH; rt chest Surgeon: Fanny Skates, MD;  Location: [facility];  Service: General;  Laterality: N/A;  . VIDEO ASSISTED THORACOSCOPY (VATS)/ LOBECTOMY Left 04/14/2016   Procedure: VIDEO ASSISTED THORACOSCOPY (VATS)/LEFT UPPER LOBECTOMY;  Surgeon: Melrose Nakayama, MD;  Location: [facility];  Service: Thoracic;  Laterality: Left;  Marland Kitchen VIDEO BRONCHOSCOPY WITH ENDOBRONCHIAL NAVIGATION Left 03/24/2016   Procedure: VIDEO BRONCHOSCOPY WITH ENDOBRONCHIAL NAVIGATION;  Surgeon: Melrose Nakayama, MD;  Location: [facility];  Service: Thoracic;  Laterality: Left;       Family History  Problem Relation Age of Onset    . Cancer Father        colon  . Cancer Sister        Breast  . Stroke Brother   . Stroke Brother   . Lung disease Neg Hx   . Rheumatologic disease Neg Hx     Social History   Tobacco Use  . Smoking status: Former Smoker    Packs/day: 1.00    Years: 50.00    Pack years: 50.00    Types: Cigarettes    Quit date: 07/05/2005    Years since quitting: 14.0  . Smokeless tobacco: Never Used  Substance Use Topics  . Alcohol use: No    Alcohol/week: 0.0 standard drinks    Comment: hx of ETOH use; quit drinking approx 2008   . Drug use: No    Home Medications Prior to Admission medications   Medication Sig Start Date End Date Taking? Authorizing Provider  amLODipine (NORVASC) 10 MG tablet TAKE ONE TABLET BY MOUTH ONCE DAILY 11/02/17   Harrison Mons, PA  aspirin 81 MG tablet Take 81 mg by mouth daily.    [provider]  fish oil-omega-3 fatty acids 1000 MG capsule Take 1 g by mouth daily.    [provider]  glipiZIDE (GLUCOTROL XL) 10 MG 24 hr tablet TAKE ONE TABLET BY MOUTH ONCE DAILY 11/02/17   Harrison Mons, PA  glucose blood (GLUCOSE METER TEST) test strip Use as instructed 11/02/17   Harrison Mons, PA  hydrochlorothiazide (HYDRODIURIL) 25 MG tablet Take 1 tablet (25 mg total) by mouth daily. 11/02/17   Harrison Mons, PA  Insulin Glargine (LANTUS) 100 UNIT/ML Solostar Pen Inject 10 Units into the skin daily at 10 pm. 11/02/17   Harrison Mons, PA  Insulin Pen Needle 32G X 4 MM MISC Use to check home glucose as directed 11/02/17   Harrison Mons, PA  Lancets (ACCU-CHEK SOFT TOUCH) lancets Use as instructed 11/17/17   Harrison Mons, PA  losartan (COZAAR) 100 MG tablet Take 1 tablet (100 mg total) by mouth daily. 11/02/17   Harrison Mons, PA  metoprolol tartrate (LOPRESSOR) 25 MG tablet Take 1 tablet (25 mg total) by mouth 2 (two) times daily. 11/02/17   Harrison Mons, PA  pravastatin (PRAVACHOL) 40 MG tablet TAKE ONE TABLET BY MOUTH ONCE DAILY 11/02/17   Harrison Mons, PA  sitaGLIPtin (JANUVIA) 50 MG tablet Take 1 tablet (50 mg total) by mouth daily. 11/02/17   Harrison Mons, PA    Allergies    Ace inhibitors and Rituximab  Review of Systems   Review of Systems  All other systems reviewed and are negative.   Physical Exam Updated Vital Signs BP 118/81 (BP Location: Right Arm)   Pulse (!) 40   Temp 98.6 F (37 C) (Oral)   Resp 14   SpO2 98%   Physical Exam Vitals and nursing note reviewed.  Constitutional:      General: He is not in acute distress.    Appearance:  He is well-developed and normal weight.  HENT:     Head: Normocephalic and atraumatic.  Eyes:     Conjunctiva/sclera: Conjunctivae normal.     Pupils: Pupils are equal, round, and reactive to light.  Cardiovascular:     Rate and Rhythm: Regular rhythm. Bradycardia present.     Heart sounds: No murmur.  Pulmonary:     Effort: Pulmonary effort is normal. No respiratory distress.     Breath sounds: Normal breath sounds. No wheezing or rales.  Abdominal:     General: There is no distension.     Palpations: Abdomen is soft.     Tenderness: There is no abdominal tenderness. There is no guarding or rebound.  Musculoskeletal:        General: No tenderness. Normal range of motion.     Cervical back: Normal range of motion and neck supple.  Skin:    General: Skin is warm and dry.     Findings: No erythema or rash.  Neurological:     General: No focal deficit present.     Mental Status: He is alert and oriented to person, place, and time. Mental status is at baseline.  Psychiatric:        Mood and Affect: Mood normal.        Behavior: Behavior normal.        Thought Content: Thought content normal.     ED Results / Procedures / Treatments   Labs (all labs ordered are listed, but only abnormal results are displayed) Labs Reviewed  BASIC METABOLIC PANEL - Abnormal; Notable for the following components:      Result Value   Glucose, Bld 189 (*)    BUN 35 (*)     Creatinine, Ser 2.43 (*)    Calcium 8.8 (*)    GFR calc non Af Amer 26 (*)    GFR calc Af Amer 30 (*)    All other components within normal limits  CBC - Abnormal; Notable for the following components:   WBC 17.7 (*)    All other components within normal limits  CBG MONITORING, ED - Abnormal; Notable for the following components:   Glucose-Capillary 169 (*)    All other components within normal limits  MAGNESIUM  TROPONIN I (HIGH SENSITIVITY)  TROPONIN I (HIGH SENSITIVITY)    EKG EKG Interpretation  Date/Time:  Friday July 06 2019 12:21:03 EST Ventricular Rate:  83 PR Interval:  144 QRS Duration: 76 QT Interval:  376 QTC Calculation: 441 R Axis:   -24 Text Interpretation: Sinus rhythm with frequent Premature ventricular complexes and Premature atrial complexes Nonspecific ST abnormality new frequent pvcs Confirmed by Blanchie Dessert [phone]) on 07/06/2019 1:09:11 PM   Radiology DG Elbow Complete Left  Result Date: 07/06/2019 CLINICAL DATA:  Posterior elbow pain for several days following weight lifting, initial encounter EXAM: LEFT ELBOW - COMPLETE 3+ VIEW COMPARISON:  None. FINDINGS: Mild spurring is noted along the distal humerus both medially and laterally. Olecranon spurring is seen. No acute fracture or dislocation is noted. No soft tissue abnormality is seen. IMPRESSION: Mild degenerative change without acute abnormality gross Electronically Signed   By: Inez Catalina M.D.   On: 07/06/2019 14:17    Procedures Procedures (including critical care time)  Medications Ordered in ED Medications  sodium chloride flush (NS) 0.9 % injection 3 mL (has no administration in time range)    ED Course  I have reviewed the triage vital signs and the nursing notes.  Pertinent labs &  imaging results that were available during my care of the patient were reviewed by me and considered in my medical decision making (see chart for details).    MDM Rules/Calculators/A&P                      Elderly male presenting with left elbow pain and swelling with warmth, erythema, tenderness and fluctuance just proximal to the olecranon.  Concern for possible septic bursitis versus cellulitis.  Patient is not able to completely extend his elbow but feel that that is more related to swelling.  He does have a leukocytosis today of 17,000 but otherwise baseline labs.  Spoke with Dr. Amedeo Plenty who requested that patient have a rapid Covid test, IV antibiotics and admission for cellulitis to medicine but he will come see the patient to ensure he does not need an I&D.  Final Clinical Impression(s) / ED Diagnoses Final diagnoses:  Cellulitis of left upper extremity  Left arm swelling    Rx / DC Orders ED Discharge Orders    None       Blanchie Dessert, MD 07/06/19 1550

## 2019-07-06 NOTE — Consult Note (Signed)
Reason for Consult: Left elbow pain Referring Physician: ER staff  [name] [name] is an 69 y.o. male.  HPI: Patient is a 69-year-old male with history of diabetes who presents with the primary complaint of left elbow pain.  The patient has swelling in his left elbow that has been present for a few days.  I discussed this with he and his daughter Rosary Lively.  The patient notes that he has pain in the posterior aspect of his elbow.  He notes that he did do some frequent lifting of weights recently but does not describe having immediate pain with the exercise or any unusual events occurred during this.  At present juncture he has a white count of 17,000.  He is a diabetic.  He and I discussed these issues at length.  He denies fever chills nausea or vomiting.  Patient denies lower extremity pain.  The right and left arms are available for review and he certainly display swelling and pain in the left elbow about the posterior aspect at the musculotendinous juncture of his triceps.  Past Medical History:  Diagnosis Date  . Anemia   . Cancer ([place]) 2016   lymphoma  . Chronic kidney disease (CKD), stage III (moderate)   . Chronic kidney disease, stage III (moderate)   . Cramps, extremity    hx of in legs bilat; currently having issues in toes bilat   . Diabetes ([place])   . Erectile dysfunction   . Essential hypertension, benign   . History of ETOH abuse    quit 1999  . History of tobacco abuse    quit 12/2008  . Hyperparathyroidism ([place])    secondary / renal per H&P with [facility] 07/17/2014   . Pulmonary infiltrate present on computed tomography 02/20/2015  . Shortness of breath dyspnea    exertion  . Type II or unspecified type diabetes mellitus with renal manifestations, not stated as uncontrolled(250.40)     Past Surgical History:  Procedure Laterality Date  . ARM WOUND REPAIR / CLOSURE     chainsaw; left forearm/wrist  . AXILLARY LYMPH NODE BIOPSY Right 11/19/2014   Procedure:  EXCISIONAL BIOPSY DEEP RIGHT AXILLARY LYMPH NODE ;  Surgeon: Fanny Skates, MD;  [facility];  Service: General;  Laterality: Right;  . colonscopy     . PORTACATH PLACEMENT N/A 11/19/2014   Procedure: INSERTION PORT-A-CATH; rt chest Surgeon: Fanny Skates, MD;  [facility];  Service: General;  Laterality: N/A;  . VIDEO ASSISTED THORACOSCOPY (VATS)/ LOBECTOMY Left 04/14/2016   Procedure: VIDEO ASSISTED THORACOSCOPY (VATS)/LEFT UPPER LOBECTOMY;  Surgeon: Melrose Nakayama, MD;  Location: [facility];  Service: Thoracic;  Laterality: Left;  Marland Kitchen VIDEO BRONCHOSCOPY WITH ENDOBRONCHIAL NAVIGATION Left 03/24/2016   Procedure: VIDEO BRONCHOSCOPY WITH ENDOBRONCHIAL NAVIGATION;  Surgeon: Melrose Nakayama, MD;  Location: [facility];  Service: Thoracic;  Laterality: Left;    Family History  Problem Relation Age of Onset  . Cancer Father        colon  . Cancer Sister        Breast  . Stroke Brother   . Stroke Brother   . Lung disease Neg Hx   . Rheumatologic disease Neg Hx     Social History:  reports that he quit smoking about 14 years ago. His smoking use included cigarettes. He has a 50.00 pack-year smoking history. He has never used smokeless tobacco. He reports that he does not drink alcohol or use drugs.  Allergies:  Allergies  Allergen Reactions  .  Ace Inhibitors Hives  . Rituximab Other (See Comments)    This medication caused him to sweat & shake. Patient is able to tolerate non-RIR rituximab     Medications: I have reviewed the patient's current medications.  Results for orders placed or performed during the hospital encounter of 07/06/19 (from the past 48 hour(s))  Basic metabolic panel     Status: Abnormal   Collection Time: 07/06/19 12:50 PM  Result Value Ref Range   Sodium 136 135 - 145 mmol/L   Potassium 4.1 3.5 - 5.1 mmol/L   Chloride 102 98 - 111 mmol/L   CO2 22 22 - 32 mmol/L   Glucose, Bld 189 (H) 70 - 99 mg/dL   BUN 35 (H) 8 - 23 mg/dL   Creatinine, Ser 2.43 (H)  0.61 - 1.24 mg/dL   Calcium 8.8 (L) 8.9 - 10.3 mg/dL   GFR calc non Af Amer 26 (L) >60 mL/min   GFR calc Af Amer 30 (L) >60 mL/min   Anion gap 12 5 - 15    Comment: Performed at [facility], [address], [place], [place] [address]  CBC     Status: Abnormal   Collection Time: 07/06/19 12:50 PM  Result Value Ref Range   WBC 17.7 (H) 4.0 - 10.5 K/uL   RBC 4.98 4.22 - 5.81 MIL/uL   Hemoglobin 14.1 13.0 - 17.0 g/dL   HCT 42.9 39.0 - 52.0 %   MCV 86.1 80.0 - 100.0 fL   MCH 28.3 26.0 - 34.0 pg   MCHC 32.9 30.0 - 36.0 g/dL   RDW 13.2 11.5 - 15.5 %   Platelets 277 150 - 400 K/uL   nRBC 0.0 0.0 - 0.2 %    Comment: Performed at [facility], [place] [address] [place], [place] [address]  Troponin I (High Sensitivity)     Status: None   Collection Time: 07/06/19 12:50 PM  Result Value Ref Range   Troponin I (High Sensitivity) 7 <18 ng/L    Comment: (NOTE) Elevated high sensitivity troponin I (hsTnI) values and significant  changes across serial measurements may suggest ACS but many other  chronic and acute conditions are known to elevate hsTnI results.  Refer to the "Links" section for chest pain algorithms and additional  guidance. Performed at [facility], [place] [address] [place], [place] [address]   Magnesium     Status: None   Collection Time: 07/06/19 12:50 PM  Result Value Ref Range   Magnesium 2.1 1.7 - 2.4 mg/dL    Comment: Performed at [facility], [place] [address], [place], [place] [address]  CBG monitoring, ED     Status: Abnormal   Collection Time: 07/06/19  1:19 PM  Result Value Ref Range   Glucose-Capillary 169 (H) 70 - 99 mg/dL    DG Elbow Complete Left  Result Date: 07/06/2019 CLINICAL DATA:  Posterior elbow pain for several days following weight lifting, initial encounter EXAM: LEFT ELBOW - COMPLETE 3+ VIEW COMPARISON:  None. FINDINGS: Mild spurring is noted along the distal humerus both medially and laterally. Olecranon spurring is seen. No  acute fracture or dislocation is noted. No soft tissue abnormality is seen. IMPRESSION: Mild degenerative change without acute abnormality gross Electronically Signed   By: Inez Catalina M.D.   On: 07/06/2019 14:17    Review of Systems  Respiratory: Negative.   Cardiovascular: Negative.    Blood pressure 118/81, pulse (!) 40, temperature 98.6 F (  37 C), temperature source Oral, resp. rate 14, SpO2 98 %. Physical Exam Patient has intact passive range of motion to the elbow with in a 50% total active range of motion parameter.  He does not have pain to indicate a septic joint.  There is no indication of a gouty arthropathy.  He has pain over the distal portion of his triceps muscle posteriorly and there is some firmness here but no fluctuance.  He has intact pulse and sensation.  He has no evidence of compartment syndrome or dystrophic reaction.  There is no evidence of necrotizing fasciitis  Right upper extremity has an IV in place he has normal range of motion and normal skin contours.  I personally reviewed the x-rays which are negative for fracture dislocation or space-occupying lesion.  I do feel the most interesting issue is the pain distally at the musculotendinous juncture of his triceps.  I discussed this with him at length but do not feel a firm area that needs any type of aspiration at this time.  Chest is clear.  Abdomen is nontender.  Lower extremity examination is benign with  normal neurovascular exam Assessment/Plan: Left elbow pain.  I feel the patient has a early cellulitic phenomenon.  Certainly the firmness in the distal portion of the musculotendinous juncture of his triceps could indicate sequelae from injury or simply evolving cellulitic and deep muscular changes.  I do not feel a fluctuant or area that is pointing that would require surgical I&D at this moment.  I would recommend scanning the elbow with an MRI to rule out abnormality.  I would highly anticipate  musculotendinous edema in this region but specifically in looking for fluid collection.  I do not feel he has a septic joint as he is nontender with my passive motion exam today.  Given the white count, diabetes, age and general health I would recommend IV antibiotics elevation and hospital admission so that we can track this and make sure he evolves into an improving state to transition to outpatient antibiotics.  If in fact he evolves in a worsening state or the MRI shows more advanced abnormality we can certainly intervene accordingly.  I discussed this with the patient and his daughter and all questions have been encouraged and answered.  Amedeo Plenty MD  Cell phone [place] [facility] 07/06/2019, 4:15 PM

## 2019-07-06 NOTE — H&P (Signed)
[facility] Admission History and Physical Service Pager: [phone]  Patient name: [name] [name] Medical record number: [id number] Date of birth: [birth year] Age: 69 y.o. Gender: male  Primary Care Provider: Harrison Mons, [place] Consultants: Ortho Code Status: Full  Chief Complaint: Left arm pain and swelling  Assessment and Plan: [name] [name] is a 69 y.o. male presenting with approximately 5 days of left upper arm posterior swelling and pain, most likely consistent with cellulitis.  PMH significant for DM 2, CKD 3B/4, HTN, HLD, adenocarcinoma of the lung, B-cell lymphoma,.    left upper arm cellulitis-patient has approximately 5 days of left upper arm pain just proximal to the olecranon in 4 days of swelling.  Patient able to have active range of motion in the elbow wrist and shoulder bilaterally.  Swelling is proximal to the joint space, therefore less likely gout or septic arthritis.  Swelling does not extend further up the upper arm and does not appear to be DVT.  Patient denies respiratory complaints or chest pain.  Spoke with Dr. Amedeo Plenty in the ED, who states he will hold off on I&D and reevaluate tomorrow.  In the meantime we will pursue IV antibiotics.  Given the patient's lack of fever and other systemic symptoms, will likely be able to transition to oral antibiotics quickly. -Admit to observation, Dr. Erin Hearing attending -Ortho consulted, Dr. Amedeo Plenty following.  Will see again in a.m. for evaluation of possible I&D. -IV clindamycin, can possibly transition to oral in the a.m. -F/you MRI of the arm -Vitals per routine -Heart healthy/carb modified diet -I&O every shift -Follow-up a.m. BMP ,CBC, and PT/INR  Diabetes mellitus type 2-patient takes Lantus 10 units nightly, Januvia 50 mg, glipizide ER 10 mg at home. -Continue Lantus nightly, with sliding scale sensitive insulin -Follow-up A1c  Hypertension-patient initially normotensive, also had an  elevated reading.  Is on HCTZ 25 mg losartan 100 mg, metoprolol tartrate 25 mg, amlodipine 10 mg at home. -Continue home medications  CKD stage IIIb/IV-GFR currently 26, recently has been low 30s.  Doubtful that this patient has recurrent AKI as he has not been dehydrated, is not currently having a systemic infection, or hypotension. -Renally dose medications -Daily BMPs  Hyperlipidemia-patient on pravastatin 40 mg at home.  Takes daily fish oil and aspirin 81 mg daily. -Continue pravastatin and aspirin  History of diffuse large B cell lymphoma-currently in remission.  Follows with Dr. Elson Areas at the cancer center.  Last visit was January 2020.  Patient states he had another appointment coming up in the next month.  Currently not on any treatment.  History of lung cancer s/p resection of upper left lobe-lungs sound grossly normal.  Patient not having any respiratory complaints.   FEN/GI: Heart healthy/carb modified, Prophylaxis: Lovenox  Disposition: MedSurg  History of Present Illness:  [name] [name] is a 69 y.o. male presenting with 5-day history of left elbow pain and 4-day history of swelling above the left elbow.  Patient states that on Monday he started to have pain in his elbow that was tender to touch and hurt and he did extension movements.  States on Tuesday he felt some swelling in the same region on his left arm.  States the swelling has gotten worse since then.  Patient states he has been doing bicep curls recently.  Rest his left tricep against his knee when doing these curls.  Does not do tricep extension exercises.  Patient has no history of gout.  He denies any  other symptoms including fever/chills, headache, chest pain, abdominal pain, N/V/D/C, dizziness, weakness/fatigue.   Review Of Systems: Per HPI with the following additions:   ROS  Patient Active Problem List   Diagnosis Date Noted  . Cellulitis of left arm 07/06/2019  . 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 COPD (chronic obstructive pulmonary disease) with emphysema ([place]) 10/26/2016  . Incisional pain 07/02/2016  . Adenocarcinoma of lung, stage 1, left ([place]) 04/15/2016  . Essential hypertension, benign 03/02/2016  . Port catheter in place 10/31/2015  . Nodule of left lung 09/11/2015  . Restrictive lung disease 04/23/2015  . Reactive thrombocytosis 01/09/2015  . Elevated liver enzymes 01/09/2015  . Leukocytosis 12/19/2014  . Thrombocytopenia due to drugs 12/11/2014  . Diffuse large B-cell lymphoma of lymph nodes of multiple regions ([place]) 11/04/2014  . Anemia in neoplastic disease 11/04/2014  . Hyperlipidemia 05/11/2013  . DM (diabetes mellitus), type 2 with renal complications ([place])   . Chronic kidney disease, stage III (moderate)   . History of tobacco abuse   . Erectile dysfunction     Past Medical History: Past Medical History:  Diagnosis Date  . Anemia   . Cancer ([place]) 2016   lymphoma  . Chronic kidney disease (CKD), stage III (moderate)   . Chronic kidney disease, stage III (moderate)   . Cramps, extremity    hx of in legs bilat; currently having issues in toes bilat   . Diabetes ([place])   . Erectile dysfunction   . Essential hypertension, benign   . History of ETOH abuse    quit 1999  . History of tobacco abuse    quit 12/2008  . Hyperparathyroidism ([place])    secondary / renal per H&P with [facility] 07/17/2014   . Pulmonary infiltrate present on computed tomography 02/20/2015  . Shortness of breath dyspnea    exertion  . Type II or unspecified type diabetes mellitus with renal manifestations, not stated as uncontrolled(250.40)     Past Surgical History: Past Surgical History:  Procedure Laterality Date  . ARM WOUND REPAIR / CLOSURE     chainsaw; left forearm/wrist  . AXILLARY LYMPH NODE BIOPSY Right 11/19/2014   Procedure: EXCISIONAL BIOPSY DEEP RIGHT AXILLARY LYMPH NODE ;  Surgeon: Fanny Skates, MD;  Location:  [facility];  Service: General;  Laterality: Right;  . colonscopy     . PORTACATH PLACEMENT N/A 11/19/2014   Procedure: INSERTION PORT-A-CATH; rt chest Surgeon: Fanny Skates, MD;  Location: [facility];  Service: General;  Laterality: N/A;  . VIDEO ASSISTED THORACOSCOPY (VATS)/ LOBECTOMY Left 04/14/2016   Procedure: VIDEO ASSISTED THORACOSCOPY (VATS)/LEFT UPPER LOBECTOMY;  Surgeon: Melrose Nakayama, MD;  Location: [facility];  Service: Thoracic;  Laterality: Left;  Marland Kitchen VIDEO BRONCHOSCOPY WITH ENDOBRONCHIAL NAVIGATION Left 03/24/2016   Procedure: VIDEO BRONCHOSCOPY WITH ENDOBRONCHIAL NAVIGATION;  Surgeon: Melrose Nakayama, MD;  Location: [facility];  Service: Thoracic;  Laterality: Left;    Social History: Social History   Tobacco Use  . Smoking status: Former Smoker    Packs/day: 1.00    Years: 50.00    Pack years: 50.00    Types: Cigarettes    Quit date: 07/05/2005    Years since quitting: 14.0  . Smokeless tobacco: Never Used  Substance Use Topics  . Alcohol use: No    Alcohol/week: 0.0 standard drinks    Comment: hx of ETOH use; quit drinking approx 2008   . Drug use: No   Additional social history:  Patient lives alone.  Denies current tobacco use, alcohol use, drug use. Please also refer to relevant sections of EMR.  Family History: Family History  Problem Relation Age of Onset  . Cancer Father        colon  . Cancer Sister        Breast  . Stroke Brother   . Stroke Brother   . Lung disease Neg Hx   . Rheumatologic disease Neg Hx      Allergies and Medications: Allergies  Allergen Reactions  . Ace Inhibitors Hives  . Rituximab Other (See Comments)    This medication caused him to sweat & shake. Patient is able to tolerate non-RIR rituximab    No current facility-administered medications on file prior to encounter.   Current Outpatient Medications on File Prior to Encounter  Medication Sig Dispense Refill  . amLODipine (NORVASC) 10 MG tablet TAKE ONE TABLET BY MOUTH ONCE  DAILY (Patient taking differently: Take 10 mg by mouth daily. ) 90 tablet 3  . aspirin 81 MG tablet Take 81 mg by mouth daily.    . fish oil-omega-3 fatty acids 1000 MG capsule Take 1 g by mouth daily.    Marland Kitchen glipiZIDE (GLUCOTROL XL) 10 MG 24 hr tablet TAKE ONE TABLET BY MOUTH ONCE DAILY (Patient taking differently: Take 10 mg by mouth daily. ) 90 tablet 3  . hydrochlorothiazide (HYDRODIURIL) 25 MG tablet Take 1 tablet (25 mg total) by mouth daily. 90 tablet 3  . Insulin Glargine (LANTUS) 100 UNIT/ML Solostar Pen Inject 10 Units into the skin daily at 10 pm. 15 mL 11  . losartan (COZAAR) 100 MG tablet Take 1 tablet (100 mg total) by mouth daily. 90 tablet 3  . metoprolol tartrate (LOPRESSOR) 25 MG tablet Take 1 tablet (25 mg total) by mouth 2 (two) times daily. 180 tablet 3  . pravastatin (PRAVACHOL) 40 MG tablet TAKE ONE TABLET BY MOUTH ONCE DAILY (Patient taking differently: Take 40 mg by mouth daily. ) 90 tablet 3  . sitaGLIPtin (JANUVIA) 50 MG tablet Take 1 tablet (50 mg total) by mouth daily. 30 tablet 12  . glucose blood (GLUCOSE METER TEST) test strip Use as instructed 100 each 3  . Insulin Pen Needle 32G X 4 MM MISC Use to check home glucose as directed 100 each 3  . Lancets (ACCU-CHEK SOFT TOUCH) lancets Use as instructed 100 each 3    Objective: BP (!) 174/73 (BP Location: Right Arm)   Pulse 85   Temp 98.6 F (37 C) (Oral)   Resp 20   SpO2 98%  Exam: General: Alert and oriented.  No acute distress.  Elderly African-American male, appears stated age. Eyes: PERRLA.  EOMI.  Some slight darkening of the sclera. ENTM: Moist oral mucosa, poor dentition Neck: No mass, no thyromegaly. Cardiovascular: Regular rhythm with occasional PVCs.  Normal rate.  No murmurs.  1+ radial pulse bilaterally Respiratory: Lungs clear to auscultation bilaterally.  No wheezes, no crackles.  No tachypnea, no increased work of breathing Gastrointestinal: Soft, nontender.  Normal bowel sounds.  Liver edge  palpated approximately 1 to 2 cm below the costal margin. MSK: Range of motion in the left elbow present but restricted due to pain.  5/5 strength upper and lower extremities bilaterally. Derm: No rashes.  Skin in the posterior left arm above the elbow appears warm to touch and slightly firm.  Nonfluctuant Neuro: Cranial nerves II through XII grossly intact.  Sensation grossly intact. Psych: Pleasant and joking affect.  Labs and Imaging: CBC BMET  Recent Labs  Lab 07/06/19 1250  WBC 17.7*  HGB 14.1  HCT 42.9  PLT 277   Recent Labs  Lab 07/06/19 1250  NA 136  K 4.1  CL 102  CO2 22  BUN 35*  CREATININE 2.43*  GLUCOSE 189*  CALCIUM 8.8*     DG elbow-mild spurring is noted along the distal humerus both medially and laterally.  Olecranon spurring is seen.  No acute fracture or dislocation is noted.  No soft tissue abnormality is seen.  Benay Pike, MD 07/06/2019, 5:32 PM PGY-2, [place] Intern pager: [phone], text pages welcome

## 2019-07-06 NOTE — ED Notes (Signed)
ED TO INPATIENT HANDOFF REPORT  ED Nurse Name and Phone #: Eliam Snapp [phone]  S Name/Age/Gender [name] [name] 69 y.o. male Room/Bed: 031C/031C  Code Status   Code Status: Prior  Home/SNF/Other Home Patient oriented to: self, place, time and situation Is this baseline? Yes   Triage Complete: Triage complete  Chief Complaint Cellulitis of left arm [J47.829]  Triage Note Pt to ER for evaluation of left elbow and forearm swelling. Elbow is warm to touch with swelling present. Reports started 3-4 days ago. Denies fever, chills. NT noticed HR of 40 in triage and did an EKG, MD Tegeler would like cardiac labs + magnesium ordered. Pt denies chest pain.     Allergies Allergies  Allergen Reactions  . Ace Inhibitors Hives  . Rituximab Other (See Comments)    This medication caused him to sweat & shake. Patient is able to tolerate non-RIR rituximab     Level of Care/Admitting Diagnosis ED Disposition    ED Disposition Condition Comment   Admit  Hospital Area: [facility] [[id number]]  Level of Care: Med-Surg [16]  Covid Evaluation: Asymptomatic Screening Protocol (No Symptoms)  Diagnosis: Cellulitis of left arm [562130]  Admitting Physician: Benay Pike [[id number]]  Attending Physician: Erin Hearing, MARSHALL L [[id number]]  PT Class (Do Not Modify): Observation [104]  PT Acc Code (Do Not Modify): Observation [10022]       B Medical/Surgery History Past Medical History:  Diagnosis Date  . Anemia   . Cancer ([place]) 2016   lymphoma  . Chronic kidney disease (CKD), stage III (moderate)   . Chronic kidney disease, stage III (moderate)   . Cramps, extremity    hx of in legs bilat; currently having issues in toes bilat   . Diabetes ([place])   . Erectile dysfunction   . Essential hypertension, benign   . History of ETOH abuse    quit 1999  . History of tobacco abuse    quit 12/2008  . Hyperparathyroidism ([place])    secondary / renal per H&P with [facility]  07/17/2014   . Pulmonary infiltrate present on computed tomography 02/20/2015  . Shortness of breath dyspnea    exertion  . Type II or unspecified type diabetes mellitus with renal manifestations, not stated as uncontrolled(250.40)    Past Surgical History:  Procedure Laterality Date  . ARM WOUND REPAIR / CLOSURE     chainsaw; left forearm/wrist  . AXILLARY LYMPH NODE BIOPSY Right 11/19/2014   Procedure: EXCISIONAL BIOPSY DEEP RIGHT AXILLARY LYMPH NODE ;  Surgeon: Fanny Skates, MD;  [facility];  Service: General;  Laterality: Right;  . colonscopy     . PORTACATH PLACEMENT N/A 11/19/2014   Procedure: INSERTION PORT-A-CATH; rt chest Surgeon: Fanny Skates, MD;  [facility];  Service: General;  Laterality: N/A;  . VIDEO ASSISTED THORACOSCOPY (VATS)/ LOBECTOMY Left 04/14/2016   Procedure: VIDEO ASSISTED THORACOSCOPY (VATS)/LEFT UPPER LOBECTOMY;  Surgeon: Melrose Nakayama, MD;  Location: [facility];  Service: Thoracic;  Laterality: Left;  Marland Kitchen VIDEO BRONCHOSCOPY WITH ENDOBRONCHIAL NAVIGATION Left 03/24/2016   Procedure: VIDEO BRONCHOSCOPY WITH ENDOBRONCHIAL NAVIGATION;  Surgeon: Melrose Nakayama, MD;  Location: [facility];  Service: Thoracic;  Laterality: Left;     A IV Location/Drains/Wounds Patient Lines/Drains/Airways Status   Active Line/Drains/Airways    Name:   Placement date:   Placement time:   Site:   Days:   Implanted Port 11/19/14 Right Chest   11/19/14    0118    Chest  1690   Peripheral IV 07/06/19 Right Antecubital   07/06/19    1537    Antecubital   less than 1   Incision (Closed) 11/19/14 Chest Right   11/19/14    1433     1690   Incision (Closed) 04/14/16 Chest Left   04/14/16    1200     1178          Intake/Output Last 24 hours No intake or output data in the 24 hours ending 07/06/19 1741  Labs/Imaging Results for orders placed or performed during the hospital encounter of 07/06/19 (from the past 48 hour(s))  Basic metabolic panel     Status: Abnormal    Collection Time: 07/06/19 12:50 PM  Result Value Ref Range   Sodium 136 135 - 145 mmol/L   Potassium 4.1 3.5 - 5.1 mmol/L   Chloride 102 98 - 111 mmol/L   CO2 22 22 - 32 mmol/L   Glucose, Bld 189 (H) 70 - 99 mg/dL   BUN 35 (H) 8 - 23 mg/dL   Creatinine, Ser 2.43 (H) 0.61 - 1.24 mg/dL   Calcium 8.8 (L) 8.9 - 10.3 mg/dL   GFR calc non Af Amer 26 (L) >60 mL/min   GFR calc Af Amer 30 (L) >60 mL/min   Anion gap 12 5 - 15    Comment: Performed at [facility], [address] [place] [address], [place], [place] [address]  CBC     Status: Abnormal   Collection Time: 07/06/19 12:50 PM  Result Value Ref Range   WBC 17.7 (H) 4.0 - 10.5 K/uL   RBC 4.98 4.22 - 5.81 MIL/uL   Hemoglobin 14.1 13.0 - 17.0 g/dL   HCT 42.9 39.0 - 52.0 %   MCV 86.1 80.0 - 100.0 fL   MCH 28.3 26.0 - 34.0 pg   MCHC 32.9 30.0 - 36.0 g/dL   RDW 13.2 11.5 - 15.5 %   Platelets 277 150 - 400 K/uL   nRBC 0.0 0.0 - 0.2 %    Comment: Performed at [facility], [place] [address], [place], [place] [address]  Troponin I (High Sensitivity)     Status: None   Collection Time: 07/06/19 12:50 PM  Result Value Ref Range   Troponin I (High Sensitivity) 7 <18 ng/L    Comment: (NOTE) Elevated high sensitivity troponin I (hsTnI) values and significant  changes across serial measurements may suggest ACS but many other  chronic and acute conditions are known to elevate hsTnI results.  Refer to the "Links" section for chest pain algorithms and additional  guidance. Performed at [facility], [place] [address] [place], [place] [address]   Magnesium     Status: None   Collection Time: 07/06/19 12:50 PM  Result Value Ref Range   Magnesium 2.1 1.7 - 2.4 mg/dL    Comment: Performed at [facility], [place] [address], [place], [place] [address]  CBG monitoring, ED     Status: Abnormal   Collection Time: 07/06/19  1:19 PM  Result Value Ref Range   Glucose-Capillary 169 (H) 70 - 99 mg/dL  Troponin I (High Sensitivity)     Status: None    Collection Time: 07/06/19  3:43 PM  Result Value Ref Range   Troponin I (High Sensitivity) 11 <18 ng/L    Comment: (NOTE) Elevated high sensitivity troponin I (hsTnI) values and significant  changes across serial measurements may suggest ACS but many other  chronic and acute conditions  are known to elevate hsTnI results.  Refer to the "Links" section for chest pain algorithms and additional  guidance. Performed at [facility], [place] [address], [place], [place] [address]    DG Elbow Complete Left  Result Date: 07/06/2019 CLINICAL DATA:  Posterior elbow pain for several days following weight lifting, initial encounter EXAM: LEFT ELBOW - COMPLETE 3+ VIEW COMPARISON:  None. FINDINGS: Mild spurring is noted along the distal humerus both medially and laterally. Olecranon spurring is seen. No acute fracture or dislocation is noted. No soft tissue abnormality is seen. IMPRESSION: Mild degenerative change without acute abnormality gross Electronically Signed   By: Inez Catalina M.D.   On: 07/06/2019 14:17    Pending Labs Unresulted Labs (From admission, onward)    Start     Ordered   07/06/19 1510  Respiratory Panel by RT PCR (Flu A&B, Covid) - Nasopharyngeal Swab  (Tier 2 Respiratory Panel by RT PCR (Flu A&B, Covid) (TAT 2 hrs))  Once,   STAT    Question Answer Comment  Is this test for diagnosis or screening Screening   Symptomatic for COVID-19 as defined by CDC No   Hospitalized for COVID-19 No   Admitted to ICU for COVID-19 No   Previously tested for COVID-19 No   Resident in a congregate (group) care setting No   Employed in healthcare setting No      07/06/19 1510   Signed and Held  HIV Antibody (routine testing w rflx)  (HIV Antibody (Routine testing w reflex) panel)  Once,   R     Signed and Held   Signed and Held  Hemoglobin A1c  Tomorrow morning,   R     Signed and Held   Signed and Held  Basic metabolic panel  Tomorrow morning,   R     Signed and Held   Signed and Held   CBC  Tomorrow morning,   R     Signed and Held   Signed and Held  Protime-INR  Tomorrow morning,   R     Signed and Held   Signed and Held  APTT  Tomorrow morning,   R     Signed and Held          Vitals/Pain Today's Vitals   07/06/19 1241 07/06/19 1557 07/06/19 1617 07/06/19 1620  BP:   (!) 174/73   Pulse:   89 85  Resp:   18 20  Temp:      TempSrc:      SpO2:   98% 98%  PainSc: 0-No pain 0-No pain      Isolation Precautions No active isolations  Medications Medications  sodium chloride flush (NS) 0.9 % injection 3 mL (3 mLs Intravenous Given 07/06/19 1619)  clindamycin (CLEOCIN) IVPB 600 mg (0 mg Intravenous Stopped 07/06/19 1710)    Mobility walks Low fall risk   Focused Assessments muscu   R Recommendations: See Admitting Provider Note  Report given to:   Additional Notes:  Left arm pain/swelling

## 2019-07-06 NOTE — ED Triage Notes (Signed)
Pt to ER for evaluation of left elbow and forearm swelling. Elbow is warm to touch with swelling present. Reports started 3-4 days ago. Denies fever, chills. NT noticed HR of 40 in triage and did an EKG, MD Tegeler would like cardiac labs + magnesium ordered. Pt denies chest pain.

## 2019-07-07 ENCOUNTER — Observation Stay (HOSPITAL_COMMUNITY): Payer: Medicare Other

## 2019-07-07 DIAGNOSIS — L03114 Cellulitis of left upper limb: Secondary | ICD-10-CM | POA: Diagnosis present

## 2019-07-07 DIAGNOSIS — M778 Other enthesopathies, not elsewhere classified: Secondary | ICD-10-CM | POA: Diagnosis present

## 2019-07-07 DIAGNOSIS — Z7982 Long term (current) use of aspirin: Secondary | ICD-10-CM | POA: Diagnosis not present

## 2019-07-07 DIAGNOSIS — E1100 Type 2 diabetes mellitus with hyperosmolarity without nonketotic hyperglycemic-hyperosmolar coma (NKHHC): Secondary | ICD-10-CM

## 2019-07-07 DIAGNOSIS — Z87891 Personal history of nicotine dependence: Secondary | ICD-10-CM | POA: Diagnosis not present

## 2019-07-07 DIAGNOSIS — Z823 Family history of stroke: Secondary | ICD-10-CM | POA: Diagnosis not present

## 2019-07-07 DIAGNOSIS — Z902 Acquired absence of lung [part of]: Secondary | ICD-10-CM | POA: Diagnosis not present

## 2019-07-07 DIAGNOSIS — Z800 Family history of malignant neoplasm of digestive organs: Secondary | ICD-10-CM | POA: Diagnosis not present

## 2019-07-07 DIAGNOSIS — I129 Hypertensive chronic kidney disease with stage 1 through stage 4 chronic kidney disease, or unspecified chronic kidney disease: Secondary | ICD-10-CM | POA: Diagnosis present

## 2019-07-07 DIAGNOSIS — M19022 Primary osteoarthritis, left elbow: Secondary | ICD-10-CM | POA: Diagnosis present

## 2019-07-07 DIAGNOSIS — Z794 Long term (current) use of insulin: Secondary | ICD-10-CM | POA: Diagnosis not present

## 2019-07-07 DIAGNOSIS — E785 Hyperlipidemia, unspecified: Secondary | ICD-10-CM | POA: Diagnosis present

## 2019-07-07 DIAGNOSIS — N184 Chronic kidney disease, stage 4 (severe): Secondary | ICD-10-CM | POA: Diagnosis present

## 2019-07-07 DIAGNOSIS — Z20822 Contact with and (suspected) exposure to covid-19: Secondary | ICD-10-CM | POA: Diagnosis present

## 2019-07-07 DIAGNOSIS — Z8572 Personal history of non-Hodgkin lymphomas: Secondary | ICD-10-CM | POA: Diagnosis not present

## 2019-07-07 DIAGNOSIS — E1122 Type 2 diabetes mellitus with diabetic chronic kidney disease: Secondary | ICD-10-CM | POA: Diagnosis present

## 2019-07-07 DIAGNOSIS — Z803 Family history of malignant neoplasm of breast: Secondary | ICD-10-CM | POA: Diagnosis not present

## 2019-07-07 DIAGNOSIS — Z85118 Personal history of other malignant neoplasm of bronchus and lung: Secondary | ICD-10-CM | POA: Diagnosis not present

## 2019-07-07 LAB — APTT: aPTT: 38 seconds — ABNORMAL HIGH (ref 24–36)

## 2019-07-07 LAB — GLUCOSE, CAPILLARY
Glucose-Capillary: 108 mg/dL — ABNORMAL HIGH (ref 70–99)
Glucose-Capillary: 132 mg/dL — ABNORMAL HIGH (ref 70–99)
Glucose-Capillary: 234 mg/dL — ABNORMAL HIGH (ref 70–99)
Glucose-Capillary: 80 mg/dL (ref 70–99)

## 2019-07-07 LAB — BASIC METABOLIC PANEL
Anion gap: 11 (ref 5–15)
BUN: 38 mg/dL — ABNORMAL HIGH (ref 8–23)
CO2: 21 mmol/L — ABNORMAL LOW (ref 22–32)
Calcium: 8.3 mg/dL — ABNORMAL LOW (ref 8.9–10.3)
Chloride: 105 mmol/L (ref 98–111)
Creatinine, Ser: 2.21 mg/dL — ABNORMAL HIGH (ref 0.61–1.24)
GFR calc Af Amer: 34 mL/min — ABNORMAL LOW (ref >60)
GFR calc non Af Amer: 29 mL/min — ABNORMAL LOW (ref >60)
Glucose, Bld: 109 mg/dL — ABNORMAL HIGH (ref 70–99)
Potassium: 3.8 mmol/L (ref 3.5–5.1)
Sodium: 137 mmol/L (ref 135–145)

## 2019-07-07 LAB — HEMOGLOBIN A1C
Hgb A1c MFr Bld: 6.6 % — ABNORMAL HIGH (ref 4.8–5.6)
Mean Plasma Glucose: 142.72 mg/dL

## 2019-07-07 LAB — CBC
HCT: 37.9 % — ABNORMAL LOW (ref 39.0–52.0)
Hemoglobin: 13.2 g/dL (ref 13.0–17.0)
MCH: 28.6 pg (ref 26.0–34.0)
MCHC: 34.8 g/dL (ref 30.0–36.0)
MCV: 82.2 fL (ref 80.0–100.0)
Platelets: 230 10*3/uL (ref 150–400)
RBC: 4.61 MIL/uL (ref 4.22–5.81)
RDW: 12.7 % (ref 11.5–15.5)
WBC: 12.7 10*3/uL — ABNORMAL HIGH (ref 4.0–10.5)
nRBC: 0 % (ref 0.0–0.2)

## 2019-07-07 LAB — PROTIME-INR
INR: 1.1 (ref 0.8–1.2)
Prothrombin Time: 14.4 seconds (ref 11.4–15.2)

## 2019-07-07 LAB — HIV ANTIBODY (ROUTINE TESTING W REFLEX): HIV Screen 4th Generation wRfx: NONREACTIVE

## 2019-07-07 NOTE — Discharge Summary (Addendum)
[facility] Discharge Summary  Patient name: [name] [name] Medical record number: [id number] Date of birth: Jan 11, 1951 Age: 69 y.o. Gender: male Date of Admission: 07/06/2019  Date of Discharge: 07/07/2018 Admitting Physician: Benay Pike, MD  Primary Care Provider: Harrison Mons, [place] Consultants: Ortho  Indication for Hospitalization: Left arm pain and swelling  Discharge Diagnoses/Problem List:  Left arm cellulitis Diabetes type 2 Hypertension CKD stage IIIb/IV Hyperlipidemia History of diffuse large B-cell lymphoma History of lung cancer status post resection of upper left lung lobe  Disposition: home  Discharge Condition: medically stable  Discharge Exam:   Gen: Alert and Oriented x 3, NAD CV: RRR, no murmurs, normal S1, S2 split Resp: CTAB, no wheezing, rales, or rhonchi, comfortable work of breathing Abd: non-distended, non-tender, soft, +bs in all four quadrants MSK: Slightly swollen elbow and slightly warm to touch. Mildly tender with palpation along the triceps and posterior elbow. Limited ROM in flexion actively. Limited strength of elbow extension as he cannot push against resisitance but 5/5 for flexion, pronation, and supination. 5/5 grip strength. Gross sensation intact Ext: no clubbing, cyanosis, or edema Skin: warm, dry, intact, no rashes  Brief Hospital Course:  Patient admitted with pain in his left elbow that was tender to touch and tender with extension movements.  X-ray of elbow showed mild degenerative changes without acute gross abnormalities.  Patient was noted to have swelling proximal to the joint space of his left elbow.  Patient was started on IV clindamycin.  Patient was evaluated by Ortho in the emergency department and reevaluated on the morning of 1/2 for potential I&D. An MRI of the left arm was ordered which showed no drainable fluid abscess.  Due to overall appearance of the patient's arm as well as his MRI findings  orthopedics determine there was no need for procedure on 07/07/19.  Per Ortho surgery patient was recommended to continue IV antibiotics for an additional night until 1/3 at which point patient was reevaluated and had improved in swelling and pain in the left elbow. He was switched to oral Clindamycin and was given 10 days total of antibiotics and instructed to follow up with Dr. Amedeo Plenty.   Issues for Follow Up:  1. PCP consider discontinuing glipizide 2. Follow up with Dr. Amedeo Plenty to ensure resolution of soft tissue cellulitis/swelling of left elbow. 3. Ensure patient has Clindamycin 450mg  and is taking TID for 10 days total of ABX treatment  Significant Procedures:   None  Significant Labs and Imaging:  Recent Labs  Lab 07/06/19 1250 07/07/19 0457  WBC 17.7* 12.7*  HGB 14.1 13.2  HCT 42.9 37.9*  PLT 277 230   Recent Labs  Lab 07/06/19 1250 07/07/19 0457  NA 136 137  K 4.1 3.8  CL 102 105  CO2 22 21*  GLUCOSE 189* 109*  BUN 35* 38*  CREATININE 2.43* 2.21*  CALCIUM 8.8* 8.3*  MG 2.1  --      Results/Tests Pending at Time of Discharge:   None  Discharge Medications:  Allergies as of 07/08/2019      Reactions   Ace Inhibitors Hives   Rituximab Other (See Comments)   This medication caused him to sweat & shake. Patient is able to tolerate non-RIR rituximab       Medication List    TAKE these medications   accu-chek soft touch lancets Use as instructed   amLODipine 10 MG tablet Commonly known as: NORVASC TAKE ONE TABLET BY MOUTH ONCE DAILY What  changed:   how much to take  how to take this  when to take this  additional instructions   aspirin 81 MG tablet Take 81 mg by mouth daily.   clindamycin 150 MG capsule Commonly known as: CLEOCIN Take 3 capsules (450 mg total) by mouth 3 (three) times daily for 9 days.   fish oil-omega-3 fatty acids 1000 MG capsule Take 1 g by mouth daily.   glipiZIDE 10 MG 24 hr tablet Commonly known as: GLUCOTROL XL TAKE  ONE TABLET BY MOUTH ONCE DAILY What changed:   how much to take  how to take this  when to take this  additional instructions   glucose blood test strip Commonly known as: Glucose Meter Test Use as instructed   hydrochlorothiazide 25 MG tablet Commonly known as: HYDRODIURIL Take 1 tablet (25 mg total) by mouth daily.   Insulin Glargine 100 UNIT/ML Solostar Pen Commonly known as: LANTUS Inject 10 Units into the skin daily at 10 pm.   Insulin Pen Needle 32G X 4 MM Misc Use to check home glucose as directed   losartan 100 MG tablet Commonly known as: COZAAR Take 1 tablet (100 mg total) by mouth daily.   metoprolol tartrate 25 MG tablet Commonly known as: LOPRESSOR Take 1 tablet (25 mg total) by mouth 2 (two) times daily.   pravastatin 40 MG tablet Commonly known as: PRAVACHOL TAKE ONE TABLET BY MOUTH ONCE DAILY What changed:   how much to take  how to take this  when to take this  additional instructions   sitaGLIPtin 50 MG tablet Commonly known as: JANUVIA Take 1 tablet (50 mg total) by mouth daily.       Discharge Instructions: Please refer to Patient Instructions section of EMR for full details.  Patient was counseled important signs and symptoms that should prompt return to medical care, changes in medications, dietary instructions, activity restrictions, and follow up appointments.   Follow-Up Appointments:   Nuala Alpha, DO 07/08/2019, 10:26 AM PGY-3, [place]

## 2019-07-07 NOTE — Consult Note (Signed)
Patient is looking well at bedside.  His white blood cell count has come down from 17,000-12,000.  The arm feels better and is less angry in terms of the cellulitic findings.  The triceps feels better as well.  MRI showed what I expected in terms of myositis in the distal musculotendinous area without abscess or focal fluid collection.  I would recommend continued IV antibiotics and then transition to home tomorrow for continued antibiotics for 10 to 14 days and I will be happy to follow him up in my office in the later part of the week to make sure things have resolved nicely.  It was a pleasure to see him today I discussed with the patient his care plan.  Once again he should be ready for discharge tomorrow in my estimation.  I feel that additional overnight antibiotics would be very important to prevent a resurgence of the issue.  Farra Nikolic MD cellular phone [phone]

## 2019-07-07 NOTE — Progress Notes (Signed)
Family Medicine Teaching Service Daily Progress Note Intern Pager: [phone]  Patient name: LOXLEY SCHMALE Medical record number: [id number] Date of birth: Oct 03, 1950 Age: 69 y.o. Gender: male  Primary Care Provider: Harrison Mons, PA Consultants: Ortho Code Status: Full  Pt Overview and Major Events to Date:  07/06/2019-admitted IV clindamycin (1/1- )  Assessment and Plan: LETCHER SCHWEIKERT is a 69 y.o. male presenting with approximately 5 days of left upper arm posterior swelling and pain, most likely consistent with cellulitis.  PMH significant for DM 2, CKD 3B/4, HTN, HLD, adenocarcinoma of the lung, B-cell lymphoma.    Left upper arm cellulitis: Patient with 5-6 days of left upper arm pain just proximal to the olecranon with 4 days of swelling.  Improved today per patient.  Currently on IV antibiotics.  Can likely transition to oral antibiotics later today as patient remains afebrile, pending outcome of Ortho seeing patient this morning and whether or not a procedure will be done. -Ortho consulted, Dr. Amedeo Plenty following.  Plans to see this morning for evaluation for possible I&D. -IV clindamycin currently, will likely transition to oral antibiotics later today -MRI of arm ending  Diabetes type 2 Current A1c 6.6.  Patient home meds include Lantus 10 units nightly, Januvia 50 mg, glipizide ER 10 mg.  Due to n.p.o. status for potential procedure this morning patient was given 5 units of Lantus last night.  Most recent blood glucose 108. -Continue home Lantus 10 units nightly with sliding scale insulin  Hypertension Current BP 113/83.  Home meds include hydrochlorothiazide 25 mg, losartan 100 mg, metoprolol tartrate 25 mg, amlodipine 10 mg -Continue home meds   CKD stage IIIb/IV GFR currently 29. -Renally dose medications -Daily BMP  Hyperlipidemia Home meds include pravastatin 40 mg, patient also takes daily fish oil aspirin 81 mg daily. -Continue pravastatin and aspirin  History  of diffuse large B-cell lymphoma Currently in remission, follows with Dr.Gorusch at cancer center.  Last saw in January 2020.  Patient states he has another appointment coming up next month.  No treatment currently.  History of lung cancer status post resection of upper left lung lobe Patient without respiratory complaints  FEN/GI: N.p.o. PPx: Lovenox  Disposition: MedSurg  Subjective:  Patient states he feels his elbow swelling has decreased.  Still endorsing pain just proximal to the olecranon on the posterior aspect of the left arm as well as the lateral aspect of the elbow.  No erythema.  Patient denies feelings of fevers or other pains at this time. Patient states his main concern is getting a diet as soon as possible if no procedure is to be done.  Objective: Temp:  [98 F (36.7 C)-99.5 F (37.5 C)] 99.5 F (37.5 C) (01/02 0549) Pulse Rate:  [40-90] 80 (01/02 0549) Resp:  [14-20] 18 (01/02 0549) BP: (113-174)/(52-83) 113/83 (01/02 0549) SpO2:  [97 %-99 %] 99 % (01/02 0549) Weight:  [78 kg] 78 kg (01/01 1820) Physical Exam:  General: Alert and oriented in no apparent distress Heart: S1, S2 with no murmurs appreciated Lungs: CTA bilaterally Abdomen: Bowel sounds present, no abdominal pain Skin: Warm and dry Extremities: Left upper extremity with some mild swelling around region of the olecranon compared to the right.  No erythema noted on exam.  Unable to determine presence of any fluid collection on physical exam.  Patient with an area of tenderness just proximal to the olecranon on the posterior aspect of the left upper arm, tenderness also noted on the lateral aspect of  the left elbow.  Patient without tenderness in his left forearm or in the bulk of his left triceps muscle.  Laboratory: Recent Labs  Lab 07/06/19 1250 07/07/19 0457  WBC 17.7* 12.7*  HGB 14.1 13.2  HCT 42.9 37.9*  PLT 277 230   Recent Labs  Lab 07/06/19 1250 07/07/19 0457  NA 136 137  K 4.1 3.8   CL 102 105  CO2 22 21*  BUN 35* 38*  CREATININE 2.43* 2.21*  CALCIUM 8.8* 8.3*  GLUCOSE 189* 109*      Imaging/Diagnostic Tests: DG Chest 2 View  Result Date: 06/25/2019 CLINICAL DATA:  Lung cancer history, left lung adenocarcinoma EXAM: CHEST - 2 VIEW COMPARISON:  Radiograph 04/05/2017, CT 07/13/2018 FINDINGS: Postsurgical changes from prior left upper lobectomy. There are atelectatic changes noted in the right lung base. Surgical clips noted in the right axilla, visualized previously. No consolidation, features of edema, pneumothorax, or effusion. The cardiomediastinal contours are unremarkable. No acute osseous abnormality. Mild gaseous distention of the stomach, nonspecific. IMPRESSION: 1. Postsurgical changes from prior left upper lobectomy. 2. Right basilar atelectatic changes. 3. No acute cardiopulmonary abnormality. 4. Mild gaseous distention of the stomach, nonspecific. Electronically Signed   By: Lovena Le M.D.   On: 06/25/2019 19:56   DG Elbow Complete Left  Result Date: 07/06/2019 CLINICAL DATA:  Posterior elbow pain for several days following weight lifting, initial encounter EXAM: LEFT ELBOW - COMPLETE 3+ VIEW COMPARISON:  None. FINDINGS: Mild spurring is noted along the distal humerus both medially and laterally. Olecranon spurring is seen. No acute fracture or dislocation is noted. No soft tissue abnormality is seen. IMPRESSION: Mild degenerative change without acute abnormality gross Electronically Signed   By: Inez Catalina M.D.   On: 07/06/2019 14:17     Lurline Del, DO 07/07/2019, 6:46 AM PGY-1, [place] Intern pager: [phone], text pages welcome

## 2019-07-08 LAB — GLUCOSE, CAPILLARY
Glucose-Capillary: 137 mg/dL — ABNORMAL HIGH (ref 70–99)
Glucose-Capillary: 167 mg/dL — ABNORMAL HIGH (ref 70–99)

## 2019-07-08 MED ORDER — CLINDAMYCIN HCL 300 MG PO CAPS
450.0000 mg | ORAL_CAPSULE | Freq: Three times a day (TID) | ORAL | Status: DC
  Administered 2019-07-08: 450 mg via ORAL
  Filled 2019-07-08 (×3): qty 1

## 2019-07-08 MED ORDER — CLINDAMYCIN HCL 150 MG PO CAPS: 450.0000 mg | ORAL_CAPSULE | Freq: Three times a day (TID) | ORAL | 0 refills | Status: AC

## 2019-07-08 MED ORDER — CLINDAMYCIN HCL 150 MG PO CAPS: 450.0000 mg | ORAL_CAPSULE | Freq: Three times a day (TID) | ORAL | 0 refills | Status: DC

## 2019-07-08 NOTE — Progress Notes (Signed)
AVS reviewed with patient. All questions answered at this time, Transportation provided by family.  Ave Filter, RN

## 2019-07-08 NOTE — Consult Note (Signed)
Reason for Consult: Left arm pain and swelling Referring Physician: ER staff and hospitalist service  [name] [name] is an 69 y.o. male.  HPI: Patient seen at bedside.  The patient was seen Friday Saturday and today.  He has improved each day.  He has less swelling minimal tenderness no pain on passive range of motion to the elbow although the elbow does have some loss of motion at the terminal extremes of flexion and extension.  He feels well.  He is hoping to transition to home today.  He denies other issues regarding his extremities.  Shoulder wrist and finger examination is benign  Past Medical History:  Diagnosis Date  . Anemia   . Cancer ([place]) 2016   lymphoma  . Chronic kidney disease (CKD), stage III (moderate)   . Chronic kidney disease, stage III (moderate)   . Cramps, extremity    hx of in legs bilat; currently having issues in toes bilat   . Diabetes ([place])   . Erectile dysfunction   . Essential hypertension, benign   . History of ETOH abuse    quit 1999  . History of tobacco abuse    quit 12/2008  . Hyperparathyroidism ([place])    secondary / renal per H&P with [facility] 07/17/2014   . Pulmonary infiltrate present on computed tomography 02/20/2015  . Shortness of breath dyspnea    exertion  . Type II or unspecified type diabetes mellitus with renal manifestations, not stated as uncontrolled(250.40)     Past Surgical History:  Procedure Laterality Date  . ARM WOUND REPAIR / CLOSURE     chainsaw; left forearm/wrist  . AXILLARY LYMPH NODE BIOPSY Right 11/19/2014   Procedure: EXCISIONAL BIOPSY DEEP RIGHT AXILLARY LYMPH NODE ;  Surgeon: Fanny Skates, MD;  Location: [facility];  Service: General;  Laterality: Right;  . colonscopy     . PORTACATH PLACEMENT N/A 11/19/2014   Procedure: INSERTION PORT-A-CATH; rt chest Surgeon: Fanny Skates, MD;  Location: [facility];  Service: General;  Laterality: N/A;  . VIDEO ASSISTED THORACOSCOPY (VATS)/ LOBECTOMY Left 04/14/2016    Procedure: VIDEO ASSISTED THORACOSCOPY (VATS)/LEFT UPPER LOBECTOMY;  Surgeon: Melrose Nakayama, MD;  Location: [facility];  Service: Thoracic;  Laterality: Left;  Marland Kitchen VIDEO BRONCHOSCOPY WITH ENDOBRONCHIAL NAVIGATION Left 03/24/2016   Procedure: VIDEO BRONCHOSCOPY WITH ENDOBRONCHIAL NAVIGATION;  Surgeon: Melrose Nakayama, MD;  Location: [facility];  Service: Thoracic;  Laterality: Left;    Family History  Problem Relation Age of Onset  . Cancer Father        colon  . Cancer Sister        Breast  . Stroke Brother   . Stroke Brother   . Lung disease Neg Hx   . Rheumatologic disease Neg Hx     Social History:  reports that he quit smoking about 14 years ago. His smoking use included cigarettes. He has a 50.00 pack-year smoking history. He has never used smokeless tobacco. He reports that he does not drink alcohol or use drugs.  Allergies:  Allergies  Allergen Reactions  . Ace Inhibitors Hives  . Rituximab Other (See Comments)    This medication caused him to sweat & shake. Patient is able to tolerate non-RIR rituximab     Medications: I have reviewed the patient's current medications.  Results for orders placed or performed during the hospital encounter of 07/06/19 (from the past 48 hour(s))  Basic metabolic panel     Status: Abnormal   Collection Time: 07/06/19 12:50  PM  Result Value Ref Range   Sodium 136 135 - 145 mmol/L   Potassium 4.1 3.5 - 5.1 mmol/L   Chloride 102 98 - 111 mmol/L   CO2 22 22 - 32 mmol/L   Glucose, Bld 189 (H) 70 - 99 mg/dL   BUN 35 (H) 8 - 23 mg/dL   Creatinine, Ser 2.43 (H) 0.61 - 1.24 mg/dL   Calcium 8.8 (L) 8.9 - 10.3 mg/dL   GFR calc non Af Amer 26 (L) >60 mL/min   GFR calc Af Amer 30 (L) >60 mL/min   Anion gap 12 5 - 15    Comment: Performed at [facility] [address], [place], [place] [address]  CBC     Status: Abnormal   Collection Time: 07/06/19 12:50 PM  Result Value Ref Range   WBC 17.7 (H) 4.0 - 10.5 K/uL   RBC 4.98 4.22 - 5.81  MIL/uL   Hemoglobin 14.1 13.0 - 17.0 g/dL   HCT 42.9 39.0 - 52.0 %   MCV 86.1 80.0 - 100.0 fL   MCH 28.3 26.0 - 34.0 pg   MCHC 32.9 30.0 - 36.0 g/dL   RDW 13.2 11.5 - 15.5 %   Platelets 277 150 - 400 K/uL   nRBC 0.0 0.0 - 0.2 %    Comment: Performed at [facility], [place] [address] [place], [place] [address]  Troponin I (High Sensitivity)     Status: None   Collection Time: 07/06/19 12:50 PM  Result Value Ref Range   Troponin I (High Sensitivity) 7 <18 ng/L    Comment: (NOTE) Elevated high sensitivity troponin I (hsTnI) values and significant  changes across serial measurements may suggest ACS but many other  chronic and acute conditions are known to elevate hsTnI results.  Refer to the "Links" section for chest pain algorithms and additional  guidance. Performed at [facility], [place] [address], [place], [place] [address]   Magnesium     Status: None   Collection Time: 07/06/19 12:50 PM  Result Value Ref Range   Magnesium 2.1 1.7 - 2.4 mg/dL    Comment: Performed at [facility], [place] [address], [place], [place] [address]  CBG monitoring, ED     Status: Abnormal   Collection Time: 07/06/19  1:19 PM  Result Value Ref Range   Glucose-Capillary 169 (H) 70 - 99 mg/dL  Troponin I (High Sensitivity)     Status: None   Collection Time: 07/06/19  3:43 PM  Result Value Ref Range   Troponin I (High Sensitivity) 11 <18 ng/L    Comment: (NOTE) Elevated high sensitivity troponin I (hsTnI) values and significant  changes across serial measurements may suggest ACS but many other  chronic and acute conditions are known to elevate hsTnI results.  Refer to the "Links" section for chest pain algorithms and additional  guidance. Performed at [facility], [place] [address], [place], [place] [address]   Respiratory Panel by RT PCR (Flu A&B, Covid) - Nasopharyngeal Swab     Status: None   Collection Time: 07/06/19  4:21 PM   Specimen: Nasopharyngeal Swab  Result Value  Ref Range   SARS Coronavirus 2 by RT PCR NEGATIVE NEGATIVE    Comment: (NOTE) SARS-CoV-2 target nucleic acids are NOT DETECTED. The SARS-CoV-2 RNA is generally detectable in upper respiratoy specimens during the acute phase of infection. The lowest concentration of SARS-CoV-2 viral copies this assay can detect is 131 copies/mL.  A negative result does not preclude SARS-Cov-2 infection and should not be used as the sole basis for treatment or other patient management decisions. A negative result may occur with  improper specimen collection/handling, submission of specimen other than nasopharyngeal swab, presence of viral mutation(s) within the areas targeted by this assay, and inadequate number of viral copies (<131 copies/mL). A negative result must be combined with clinical observations, patient history, and epidemiological information. The expected result is Negative. Fact Sheet for Patients:  PinkCheek.be Fact Sheet for Healthcare Providers:  GravelBags.it This test is not yet ap proved or cleared by the [place] FDA and  has been authorized for detection and/or diagnosis of SARS-CoV-2 by FDA under an Emergency Use Authorization (EUA). This EUA will remain  in effect (meaning this test can be used) for the duration of the COVID-19 declaration under Section 564(b)(1) of the Act, 21 U.S.C. section 360bbb-3(b)(1), unless the authorization is terminated or revoked sooner.    Influenza A by PCR NEGATIVE NEGATIVE   Influenza B by PCR NEGATIVE NEGATIVE    Comment: (NOTE) The Xpert Xpress SARS-CoV-2/FLU/RSV assay is intended as an aid in  the diagnosis of influenza from Nasopharyngeal swab specimens and  should not be used as a sole basis for treatment. Nasal washings and  aspirates are unacceptable for Xpert Xpress SARS-CoV-2/FLU/RSV  testing. Fact Sheet for Patients: PinkCheek.be Fact Sheet  for Healthcare Providers: GravelBags.it This test is not yet approved or cleared by the [place] FDA and  has been authorized for detection and/or diagnosis of SARS-CoV-2 by  FDA under an Emergency Use Authorization (EUA). This EUA will remain  in effect (meaning this test can be used) for the duration of the  Covid-19 declaration under Section 564(b)(1) of the Act, 21  U.S.C. section 360bbb-3(b)(1), unless the authorization is  terminated or revoked. Performed at [facility], [place] [address], [place], [place] [address]   Glucose, capillary     Status: Abnormal   Collection Time: 07/06/19  9:28 PM  Result Value Ref Range   Glucose-Capillary 292 (H) 70 - 99 mg/dL   Comment 1 Procedure Error   HIV Antibody (routine testing w rflx)     Status: None   Collection Time: 07/07/19  4:57 AM  Result Value Ref Range   HIV Screen 4th Generation wRfx NON REACTIVE NON REACTIVE    Comment: Performed at [facility], [place] [address], [place], [place] [address]  Hemoglobin A1c     Status: Abnormal   Collection Time: 07/07/19  4:57 AM  Result Value Ref Range   Hgb A1c MFr Bld 6.6 (H) 4.8 - 5.6 %    Comment: (NOTE) Pre diabetes:          5.7%-6.4% Diabetes:              >6.4% Glycemic control for   <7.0% adults with diabetes    Mean Plasma Glucose 142.72 mg/dL    Comment: Performed at [facility] [address] [place], [place] [address]  Basic metabolic panel     Status: Abnormal   Collection Time: 07/07/19  4:57 AM  Result Value Ref Range   Sodium 137 135 - 145 mmol/L   Potassium 3.8 3.5 - 5.1 mmol/L   Chloride 105 98 - 111 mmol/L   CO2 21 (L) 22 - 32 mmol/L   Glucose, Bld 109 (H) 70 - 99 mg/dL   BUN 38 (H) 8 - 23 mg/dL   Creatinine, Ser  2.21 (H) 0.61 - 1.24 mg/dL   Calcium 8.3 (L) 8.9 - 10.3 mg/dL   GFR calc non Af Amer 29 (L) >60 mL/min   GFR calc Af Amer 34 (L) >60 mL/min   Anion gap 11 5 - 15    Comment: Performed at [facility] [address], [place], [place] [address]  CBC     Status: Abnormal   Collection Time: 07/07/19  4:57 AM  Result Value Ref Range   WBC 12.7 (H) 4.0 - 10.5 K/uL   RBC 4.61 4.22 - 5.81 MIL/uL   Hemoglobin 13.2 13.0 - 17.0 g/dL   HCT 37.9 (L) 39.0 - 52.0 %   MCV 82.2 80.0 - 100.0 fL   MCH 28.6 26.0 - 34.0 pg   MCHC 34.8 30.0 - 36.0 g/dL   RDW 12.7 11.5 - 15.5 %   Platelets 230 150 - 400 K/uL   nRBC 0.0 0.0 - 0.2 %    Comment: Performed at [facility], [place] [address] [place], [place] [address]  Protime-INR     Status: None   Collection Time: 07/07/19  4:57 AM  Result Value Ref Range   Prothrombin Time 14.4 11.4 - 15.2 seconds   INR 1.1 0.8 - 1.2    Comment: (NOTE) INR goal varies based on device and disease states. Performed at [facility], [place] [address] [place], [place] [address]   APTT     Status: Abnormal   Collection Time: 07/07/19  4:57 AM  Result Value Ref Range   aPTT 38 (H) 24 - 36 seconds    Comment:        IF BASELINE aPTT IS ELEVATED, SUGGEST PATIENT RISK ASSESSMENT BE USED TO DETERMINE APPROPRIATE ANTICOAGULANT THERAPY. Performed at [facility], [place] [address], [place],  [address]   Glucose, capillary     Status: Abnormal   Collection Time: 07/07/19  6:27 AM  Result Value Ref Range   Glucose-Capillary 108 (H) 70 - 99 mg/dL  Glucose, capillary     Status: Abnormal   Collection Time: 07/07/19 10:03 AM  Result Value Ref Range   Glucose-Capillary 132 (H) 70 - 99 mg/dL   Comment 1 Notify RN   Glucose, capillary     Status: Abnormal   Collection Time: 07/07/19  4:08 PM  Result Value Ref Range   Glucose-Capillary 234 (H) 70 - 99 mg/dL   Comment 1 Notify RN   Glucose, capillary     Status: None   Collection Time: 07/07/19  9:10 PM  Result Value Ref Range   Glucose-Capillary 80 70 - 99 mg/dL  Glucose, capillary     Status: Abnormal   Collection Time: 07/08/19  5:58 AM  Result Value Ref Range   Glucose-Capillary 137 (H)  70 - 99 mg/dL    DG Elbow Complete Left  Result Date: 07/06/2019 CLINICAL DATA:  Posterior elbow pain for several days following weight lifting, initial encounter EXAM: LEFT ELBOW - COMPLETE 3+ VIEW COMPARISON:  None. FINDINGS: Mild spurring is noted along the distal humerus both medially and laterally. Olecranon spurring is seen. No acute fracture or dislocation is noted. No soft tissue abnormality is seen. IMPRESSION: Mild degenerative change without acute abnormality gross Electronically Signed   By: Inez Catalina M.D.   On: 07/06/2019 14:17   MR ELBOW LEFT WO CONTRAST  Result Date: 07/07/2019 CLINICAL DATA:  Four days of elbow pain and swelling EXAM: MRI OF THE LEFT ELBOW  WITHOUT CONTRAST TECHNIQUE: Multiplanar, multisequence MR imaging of the elbow was performed. No intravenous contrast was administered. COMPARISON:  Left elbow x-ray 07/06/2019 FINDINGS: Technical note: Examination is degraded by motion artifact. TENDONS Common forearm flexor origin: Intact with mild tendinosis and enthesopathic changes. Common forearm extensor origin: Intact with enthesopathic changes. Biceps: Intact. Triceps: Intact with marked tendinosis and enthesopathic changes at the olecranon process. LIGAMENTS Medial stabilizers: Poorly evaluated, grossly intact. Lateral stabilizers:  Poorly evaluated, grossly intact. Cartilage: Diffuse cartilage thinning and surface irregularity. Joint: Small joint effusion. No dislocation. Cubital tunnel: Unremarkable. Bones: No acute fracture or bone marrow signal abnormality. Joint space narrowing with marginal osteophyte formation. Other: There is moderate soft tissue swelling with subcutaneous edema at the posterior and medial aspects of the elbow. No well-defined or drainable fluid collection. Intramuscular edema within the visualized distal triceps muscle belly. IMPRESSION: 1. Moderate soft tissue swelling with subcutaneous edema at the posterior and medial aspects of the elbow. No  well-defined or drainable fluid collection. 2. Small elbow joint effusion. This is a nonspecific finding and can be secondary to degenerative changes, inflammation, as well as infection. If there is clinical concern for septic arthropathy, arthrocentesis would be recommended. 3. Triceps tendinosis with intramuscular edema within the visualized distal triceps muscle. Findings could be due to muscle strain or myositis. 4. Moderate elbow osteoarthritis. 5. Mild tendinosis of the common flexor and extensor tendons with chronic enthesopathic changes. Electronically Signed   By: Davina Poke D.O.   On: 07/07/2019 11:20    Review of Systems Blood pressure (!) 129/100, pulse 86, temperature 98.4 F (36.9 C), temperature source Oral, resp. rate 18, height 5\' 10"  (1.778 m), weight 78 kg, SpO2 97 %. Physical Exam Left arm is stable.  He has no pain with pronation or supination no signs of dystrophy and no signs of vascular compromise He does have some terminal loss of flexion and extension but this is not particularly tender for him.  I reviewed this with him at length and the findings.  At present juncture the tenderness at the distal musculotendinous insertion of his triceps is markedly improved.  The tissue is stable and without significant problematic feature today.  His left elbow still a bit more swollen in general than the right side with slight loss of motion.  He does have some pre-existing arthritis in the region which is noteworthy.  I do not see any signs of pain on passive motion to suggest intra-articular sepsis and do feel his condition has continued to improve.  Shoulder and hand examination is benign and stable today.  There is no signs of infection or dystrophy.  There is no signs of vascular compromise.  He denies lower extremity complaints as well. Assessment/Plan: Left arm cellulitis at the elbow resolving nicely with minimal pain today.  I discussed with patient I do not see a  surgical indication.  There is no evidence of a septic joint and the MRI did bear out findings consistent with what we felt was at play in terms of cellulitis and distal triceps edema.  I have asked the patient to not work out with weights or other measures and to be careful with the arm over the ensuing week.  I had be happy to see him back in the office if he has any worsening or problems.  He could certainly be followed up by his primary care physician for the cellulitic event as there is no gross surgical indications although I am happy to see him  towards the end of the week as well.  We had a long detailed discussion.  I wrote down my name for him so that if there are any problems he can call me.  We had a detailed conversation at his bedside today.  I would certainly endorse continued observation and an outpatient antibiotic program at this juncture.  As my prior note mentioned a antibiotic regime for 10 to 14 days in the diabetic population at his age would be warranted.  Satira Anis Karstyn Birkey III 07/08/2019, 9:35 AM

## 2019-07-08 NOTE — Discharge Instructions (Signed)

## 2019-07-08 NOTE — Progress Notes (Addendum)
Family Medicine Teaching Service Daily Progress Note Intern Pager: [phone]  Patient name: [name] [name] Medical record number: [id number] Date of birth: 1951-03-03 Age: 69 y.o. Gender: male  Primary Care Provider: Harrison Mons, PA Consultants: Ortho Code Status: Full  Pt Overview and Major Events to Date:  07/06/2019-admitted IV clindamycin (1/1- )  Assessment and Plan: [name] [name] is a 69 y.o. male presenting with approximately 5 days of left upper arm posterior swelling and pain, most likely consistent with cellulitis.  PMH significant for DM 2, CKD 3B/4, HTN, HLD, adenocarcinoma of the lung, B-cell lymphoma.    Left upper arm cellulitis: Acute, improving. Patient with 5-6 days of left upper arm pain just proximal to the olecranon with 4 days of swelling. Finishing 24 hours of IV antibiotics and will transition to oral today. MRI showed soft tissue swelling and subcutaneous edema with small elbow joint effusion, triceps tendonitis with moderate elbow osteoarthritis and mild tendinosis of the flexor and extensor tendons (likely chronic). -Ortho consulted, Dr. Amedeo Plenty following who recommended IV antibiotics for 24 hours and transition to oral today done today with likely discharge home. Clindamycin 450 TID for 10 days - Outpatient follow up with Dr. Amedeo Plenty ortho  Diabetes type 2 Current A1c 6.6.  Patient home meds include Lantus 10 units nightly, Januvia 50 mg, glipizide ER 10 mg. Glucose well controlled on admission with range of 80-137 in past 24 hours. -Cont CBGs -Continue home Lantus 10 units nightly with sliding scale insulin  Hypertension Past 24 hour SBP 109-129 and DBP 43-100.  Home meds include hydrochlorothiazide 25 mg, losartan 100 mg, metoprolol tartrate 25 mg, amlodipine 10 mg -Continue home meds   CKD stage IIIb/IV GFR currently 29. -Renally dose medications -Daily BMP  Hyperlipidemia Home meds include pravastatin 40 mg, patient also takes daily fish oil  aspirin 81 mg daily. -Continue pravastatin and aspirin  History of diffuse large B-cell lymphoma Currently in remission, follows with Dr.Gorusch at cancer center.  Last saw in January 2020.  Patient states he has another appointment coming up next month.  No treatment currently.  History of lung cancer status post resection of upper left lung lobe Patient without respiratory complaints  FEN/GI: N.p.o. PPx: Lovenox  Disposition: MedSurg  Subjective:  Patient states he is feeling well. His elbow swelling has improved much. It is still mildly tender and he continues to have limited range of motion but denies fever, chills, nausea, or vomiting. Overall, he states he is ready to go home.  Objective: Temp:  [98.4 F (36.9 C)-99.7 F (37.6 C)] 98.4 F (36.9 C) (01/03 0559) Pulse Rate:  [79-86] 86 (01/03 0559) Resp:  [16-18] 18 (01/02 2024) BP: (109-129)/(43-100) 129/100 (01/03 0559) SpO2:  [95 %-97 %] 97 % (01/03 0559) Physical Exam: Gen: Alert and Oriented x 3, NAD CV: RRR, no murmurs, normal S1, S2 split Resp: CTAB, no wheezing, rales, or rhonchi, comfortable work of breathing Abd: non-distended, non-tender, soft, +bs in all four quadrants MSK: Slightly swollen elbow and slightly warm to touch. Mildly tender with palpation along the triceps and posterior elbow. Limited ROM in flexion actively. Limited strength of elbow extension as he cannot push against resisitance but 5/5 for flexion, pronation, and supination. 5/5 grip strength. Gross sensation intact Ext: no clubbing, cyanosis, or edema Skin: warm, dry, intact, no rashes  Laboratory: Recent Labs  Lab 07/06/19 1250 07/07/19 0457  WBC 17.7* 12.7*  HGB 14.1 13.2  HCT 42.9 37.9*  PLT 277 230   Recent Labs  Lab 07/06/19 1250 07/07/19 0457  NA 136 137  K 4.1 3.8  CL 102 105  CO2 22 21*  BUN 35* 38*  CREATININE 2.43* 2.21*  CALCIUM 8.8* 8.3*  GLUCOSE 189* 109*      Imaging/Diagnostic Tests: DG Chest 2  View  Result Date: 06/25/2019 CLINICAL DATA:  Lung cancer history, left lung adenocarcinoma EXAM: CHEST - 2 VIEW COMPARISON:  Radiograph 04/05/2017, CT 07/13/2018 FINDINGS: Postsurgical changes from prior left upper lobectomy. There are atelectatic changes noted in the right lung base. Surgical clips noted in the right axilla, visualized previously. No consolidation, features of edema, pneumothorax, or effusion. The cardiomediastinal contours are unremarkable. No acute osseous abnormality. Mild gaseous distention of the stomach, nonspecific. IMPRESSION: 1. Postsurgical changes from prior left upper lobectomy. 2. Right basilar atelectatic changes. 3. No acute cardiopulmonary abnormality. 4. Mild gaseous distention of the stomach, nonspecific. Electronically Signed   By: Lovena Le M.D.   On: 06/25/2019 19:56   DG Elbow Complete Left  Result Date: 07/06/2019 CLINICAL DATA:  Posterior elbow pain for several days following weight lifting, initial encounter EXAM: LEFT ELBOW - COMPLETE 3+ VIEW COMPARISON:  None. FINDINGS: Mild spurring is noted along the distal humerus both medially and laterally. Olecranon spurring is seen. No acute fracture or dislocation is noted. No soft tissue abnormality is seen. IMPRESSION: Mild degenerative change without acute abnormality gross Electronically Signed   By: Inez Catalina M.D.   On: 07/06/2019 14:17   MR ELBOW LEFT WO CONTRAST  Result Date: 07/07/2019 CLINICAL DATA:  Four days of elbow pain and swelling EXAM: MRI OF THE LEFT ELBOW WITHOUT CONTRAST TECHNIQUE: Multiplanar, multisequence MR imaging of the elbow was performed. No intravenous contrast was administered. COMPARISON:  Left elbow x-ray 07/06/2019 FINDINGS: Technical note: Examination is degraded by motion artifact. TENDONS Common forearm flexor origin: Intact with mild tendinosis and enthesopathic changes. Common forearm extensor origin: Intact with enthesopathic changes. Biceps: Intact. Triceps: Intact with marked  tendinosis and enthesopathic changes at the olecranon process. LIGAMENTS Medial stabilizers: Poorly evaluated, grossly intact. Lateral stabilizers:  Poorly evaluated, grossly intact. Cartilage: Diffuse cartilage thinning and surface irregularity. Joint: Small joint effusion. No dislocation. Cubital tunnel: Unremarkable. Bones: No acute fracture or bone marrow signal abnormality. Joint space narrowing with marginal osteophyte formation. Other: There is moderate soft tissue swelling with subcutaneous edema at the posterior and medial aspects of the elbow. No well-defined or drainable fluid collection. Intramuscular edema within the visualized distal triceps muscle belly. IMPRESSION: 1. Moderate soft tissue swelling with subcutaneous edema at the posterior and medial aspects of the elbow. No well-defined or drainable fluid collection. 2. Small elbow joint effusion. This is a nonspecific finding and can be secondary to degenerative changes, inflammation, as well as infection. If there is clinical concern for septic arthropathy, arthrocentesis would be recommended. 3. Triceps tendinosis with intramuscular edema within the visualized distal triceps muscle. Findings could be due to muscle strain or myositis. 4. Moderate elbow osteoarthritis. 5. Mild tendinosis of the common flexor and extensor tendons with chronic enthesopathic changes. Electronically Signed   By: Davina Poke D.O.   On: 07/07/2019 11:20     Nuala Alpha, DO 07/08/2019, 8:49 AM PGY-3, [facility] Intern pager: [phone], text pages welcome

## 2019-07-19 ENCOUNTER — Other Ambulatory Visit: Payer: Self-pay

## 2019-07-19 ENCOUNTER — Telehealth: Payer: Self-pay | Admitting: Hematology and Oncology

## 2019-07-19 ENCOUNTER — Encounter: Payer: Self-pay | Admitting: Hematology and Oncology

## 2019-07-19 ENCOUNTER — Inpatient Hospital Stay: Payer: Medicare Other | Admitting: Hematology and Oncology

## 2019-07-19 ENCOUNTER — Inpatient Hospital Stay: Payer: Medicare Other | Attending: Hematology and Oncology

## 2019-07-19 VITALS — BP 137/87 | HR 71 | Temp 97.8°F | Resp 18 | Ht 70.0 in | Wt 170.0 lb

## 2019-07-19 DIAGNOSIS — Z79899 Other long term (current) drug therapy: Secondary | ICD-10-CM | POA: Insufficient documentation

## 2019-07-19 DIAGNOSIS — N183 Chronic kidney disease, stage 3 unspecified: Secondary | ICD-10-CM

## 2019-07-19 DIAGNOSIS — E1122 Type 2 diabetes mellitus with diabetic chronic kidney disease: Secondary | ICD-10-CM | POA: Insufficient documentation

## 2019-07-19 DIAGNOSIS — Z794 Long term (current) use of insulin: Secondary | ICD-10-CM | POA: Diagnosis not present

## 2019-07-19 DIAGNOSIS — Z9221 Personal history of antineoplastic chemotherapy: Secondary | ICD-10-CM | POA: Insufficient documentation

## 2019-07-19 DIAGNOSIS — C3492 Malignant neoplasm of unspecified part of left bronchus or lung: Secondary | ICD-10-CM

## 2019-07-19 DIAGNOSIS — Z7982 Long term (current) use of aspirin: Secondary | ICD-10-CM | POA: Insufficient documentation

## 2019-07-19 DIAGNOSIS — C8338 Diffuse large B-cell lymphoma, lymph nodes of multiple sites: Secondary | ICD-10-CM | POA: Diagnosis not present

## 2019-07-19 LAB — COMPREHENSIVE METABOLIC PANEL
ALT: 64 U/L — ABNORMAL HIGH (ref 0–44)
AST: 37 U/L (ref 15–41)
Albumin: 3.8 g/dL (ref 3.5–5.0)
Alkaline Phosphatase: 66 U/L (ref 38–126)
Anion gap: 10 (ref 5–15)
BUN: 35 mg/dL — ABNORMAL HIGH (ref 8–23)
CO2: 25 mmol/L (ref 22–32)
Calcium: 8.7 mg/dL — ABNORMAL LOW (ref 8.9–10.3)
Chloride: 107 mmol/L (ref 98–111)
Creatinine, Ser: 2.18 mg/dL — ABNORMAL HIGH (ref 0.61–1.24)
GFR calc Af Amer: 34 mL/min — ABNORMAL LOW (ref >60)
GFR calc non Af Amer: 30 mL/min — ABNORMAL LOW (ref >60)
Glucose, Bld: 190 mg/dL — ABNORMAL HIGH (ref 70–99)
Potassium: 4.2 mmol/L (ref 3.5–5.1)
Sodium: 142 mmol/L (ref 135–145)
Total Bilirubin: 0.5 mg/dL (ref 0.3–1.2)
Total Protein: 6.6 g/dL (ref 6.5–8.1)

## 2019-07-19 LAB — CBC WITH DIFFERENTIAL/PLATELET
Abs Immature Granulocytes: 0.08 10*3/uL — ABNORMAL HIGH (ref 0.00–0.07)
Basophils Absolute: 0.1 10*3/uL (ref 0.0–0.1)
Basophils Relative: 1 %
Eosinophils Absolute: 0.1 10*3/uL (ref 0.0–0.5)
Eosinophils Relative: 1 %
HCT: 42.8 % (ref 39.0–52.0)
Hemoglobin: 14.2 g/dL (ref 13.0–17.0)
Immature Granulocytes: 1 %
Lymphocytes Relative: 14 %
Lymphs Abs: 1.9 10*3/uL (ref 0.7–4.0)
MCH: 27.8 pg (ref 26.0–34.0)
MCHC: 33.2 g/dL (ref 30.0–36.0)
MCV: 83.8 fL (ref 80.0–100.0)
Monocytes Absolute: 0.7 10*3/uL (ref 0.1–1.0)
Monocytes Relative: 5 %
Neutro Abs: 10.4 10*3/uL — ABNORMAL HIGH (ref 1.7–7.7)
Neutrophils Relative %: 78 %
Platelets: 350 10*3/uL (ref 150–400)
RBC: 5.11 MIL/uL (ref 4.22–5.81)
RDW: 13.3 % (ref 11.5–15.5)
WBC: 13.3 10*3/uL — ABNORMAL HIGH (ref 4.0–10.5)
nRBC: 0 % (ref 0.0–0.2)

## 2019-07-19 NOTE — Telephone Encounter (Signed)
Scheduled per 1/14 sch msg. Called and spoke with pt, confirmed 1/13 appt

## 2019-07-19 NOTE — Assessment & Plan Note (Signed)
He has chronic kidney disease and this is unrelated to the lymphoma.  Continue close monitoring. he will continue current medical management. We discussed the importance of risk factor modification including weight loss and dietary change to control his diabetes better

## 2019-07-19 NOTE — Assessment & Plan Note (Signed)
Clinically, he has no evidence of disease recurrence. The patient is more than 4 years out from his prior chemo I do not recommend routine surveillance imaging study anymore moving forward I will see him back in a year with history, physical examination and blood work

## 2019-07-19 NOTE — Assessment & Plan Note (Signed)
His last CT imaging which showed no evidence of disease He does not need adjuvant treatment I recommend discontinuation of routine surveillance imaging study with CT imaging I reviewed his recent chest x-ray done when he presented to the emergency department and it was unremarkable I will continue chest x-ray once a year

## 2019-07-19 NOTE — Progress Notes (Signed)
[facility] OFFICE PROGRESS NOTE  Patient Care Team: Harrison Mons, [place] as PCP - General (Family Medicine) Corliss Parish, MD as Consulting Physician (Nephrology) Fanny Skates, MD as Consulting Physician (General Surgery) Heath Lark, MD as Consulting Physician (Hematology and Oncology) Javier Glazier, MD as Consulting Physician (Pulmonary Disease) Juanita Craver, MD as Consulting Physician (Gastroenterology)  ASSESSMENT & PLAN:  Adenocarcinoma of lung, stage 1, left ([place]) His last CT imaging which showed no evidence of disease He does not need adjuvant treatment I recommend discontinuation of routine surveillance imaging study with CT imaging I reviewed his recent chest x-ray done when he presented to the emergency department and it was unremarkable I will continue chest x-ray once a year  Diffuse large B-cell lymphoma of lymph nodes of multiple regions [facility]) Clinically, he has no evidence of disease recurrence. The patient is more than 4 years out from his prior chemo I do not recommend routine surveillance imaging study anymore moving forward I will see him back in a year with history, physical examination and blood work  Chronic kidney disease, stage III (moderate) He has chronic kidney disease and this is unrelated to the lymphoma.  Continue close monitoring. he will continue current medical management. We discussed the importance of risk factor modification including weight loss and dietary change to control his diabetes better   Orders Placed This Encounter  Procedures  . DG Chest 2 View    Standing Status:   Future    Standing Expiration Date:   08/22/2020    Order Specific Question:   Reason for exam:    Answer:   hx lung cancer and lymphoma    Order Specific Question:   Preferred imaging location?    Answer:   [facility]  . Comprehensive metabolic panel    Standing Status:   Future    Standing Expiration Date:   08/22/2020  . CBC  with Differential    Standing Status:   Future    Standing Expiration Date:   08/22/2020    All questions were answered. The patient knows to call the clinic with any problems, questions or concerns. The total time spent in the appointment was 20 minutes encounter with patients including review of chart and various tests results, discussions about plan of care and coordination of care plan   Heath Lark, MD 07/19/2019 10:39 AM  INTERVAL HISTORY: Please see below for problem oriented charting. He returns for further follow-up He was recently in the hospital briefly for cellulitis affecting his left upper arm He had extensive evaluation which rule out osteomyelitis He responded well to antibiotics and his arm looks fine today He had a sebaceous cyst on the top of his skull waiting to be drained by his primary care doctor No new lymphadenopathy otherwise Denies recent cough, chest pain or shortness of breath  SUMMARY OF ONCOLOGIC HISTORY: Oncology History Overview Note  Lymphoma   Staging form: Lymphoid Neoplasms, AJCC 6th Edition     Clinical stage from 11/21/2014: Stage IV - Signed by Heath Lark, MD on 11/21/2014     Diffuse large B-cell lymphoma of lymph nodes of multiple regions ([place])  11/13/2014 Imaging   PEt scan showed widespread disease including possible splenic involvement   11/19/2014 Surgery   He has excisional LN biopsy and port placement   11/19/2014 Pathology Results   Biopsy of lymph nodes showed DLBCL   11/29/2014 - 01/31/2015 Chemotherapy   He received 4 cycles of RCHOP chemotherapy complicated by  severe infusion reaction.   12/19/2014 - 01/09/2015 Chemotherapy   He received 2 cycles of prophylactic IT chemotherapy.   01/31/2015 Adverse Reaction   I reduce the dose of his chemotherapy, prednisone and dexamethasone due to poorly controlled diabetes and hyperglycemia   02/12/2015 Imaging    repeat PET CT scan show complete response to chemotherapy but new lung  infiltrates   09/09/2015 Imaging   PET CT showed complete response except for new inflammatory change in the lung   03/01/2016 Imaging   CT chest showed mild interval growth of subsolid left upper lobe pulmonary nodule, which was hypermetabolic on 36/64/4034 PET-CT. A primary bronchogenic adenocarcinoma cannot be excluded. Thoracic surgical consultation is advised. No lymphadenopathy or other findings of metastatic disease in the chest. Moderate emphysema with mild diffuse bronchial wall thickening, suggesting COPD. Aortic atherosclerosis. Stable small pericardial effusion/thickening.   08/30/2016 PET scan   No evidence of hypermetabolic residual or recurrent lymphoma. 2. Interval left upper lobe wedge resection for adenocarcinoma. Healing left rib fractures which are likely postsurgical. 3. Aortic atherosclerosis.   02/25/2017 PET scan   1. No abnormal nodal enlargement or hypermetabolic activity to suggest recurrent lymphoma. Postoperative findings in the left chest without findings of recurrent left lung cancer. 2. Aortic Atherosclerosis (ICD10-I70.0) and Emphysema (ICD10-J43.9).   Adenocarcinoma of lung, stage 1, left ([place])  03/24/2016 Procedure   He underwent electromagnetic navigational bronchoscopy with needle aspirations, brushings, and biopsies.   03/24/2016 Pathology Results   Accession: [id number] biopsy was positive for adenocarcinoma   04/15/2016 Initial Diagnosis   Adenocarcinoma of lung, stage 1, left ([place])   04/15/2016 Surgery   He underwent left video-assisted thoracoscopy, thoracoscopic left upper lobectomy, mediastinal lymph node dissection, on-Q local anesthetic catheter placement   04/15/2016 Pathology Results   Accession: [id number] surgical specimen revealed adenocarcinoma, 2.2 cm with negative margin and negative LN involvement   07/08/2017 Imaging   1. No evidence of local tumor recurrence in the left hemithorax status post left upper lobectomy. 2. No evidence of  recurrent lymphoma. No adenopathy. Normal size spleen. 3. Solitary 5 mm solid pulmonary nodule in the superior segment right lower lobe, for which 16 month stability has been demonstrated, probably benign. 4. Subcentimeter low-attenuation lateral segment left liver lobe lesion, for which 16 month stability has been demonstrated, probably benign. 5. No evidence of bowel obstruction or acute bowel inflammation. 6. Chronic findings include: Aortic Atherosclerosis (ICD10-I70.0) and Emphysema (ICD10-J43.9). Mild-to-moderate prostatomegaly. Small fat containing left inguinal hernia.   07/13/2018 Imaging   1. Status post left upper lobectomy, without recurrent or metastatic disease. 2. Right axillary node dissection, without evidence of active lymphoma. 3. Aortic atherosclerosis (ICD10-I70.0) and emphysema (ICD10-J43.9). 4. Right lower lobe pulmonary nodule is similar and can be presumed benign.   06/25/2019 Imaging   CXR 1. Postsurgical changes from prior left upper lobectomy. 2. Right basilar atelectatic changes. 3. No acute cardiopulmonary abnormality. 4. Mild gaseous distention of the stomach, nonspecific.     REVIEW OF SYSTEMS:   Constitutional: Denies fevers, chills or abnormal weight loss Eyes: Denies blurriness of vision Ears, nose, mouth, throat, and face: Denies mucositis or sore throat Respiratory: Denies cough, dyspnea or wheezes Cardiovascular: Denies palpitation, chest discomfort or lower extremity swelling Gastrointestinal:  Denies nausea, heartburn or change in bowel habits Lymphatics: Denies new lymphadenopathy or easy bruising Neurological:Denies numbness, tingling or new weaknesses Behavioral/Psych: Mood is stable, no new changes  All other systems were reviewed with the patient and are negative.  I have reviewed the  past medical history, past surgical history, social history and family history with the patient and they are unchanged from previous note.  ALLERGIES:  is  allergic to ace inhibitors and rituximab.  MEDICATIONS:  Current Outpatient Medications  Medication Sig Dispense Refill  . amLODipine (NORVASC) 10 MG tablet TAKE ONE TABLET BY MOUTH ONCE DAILY (Patient taking differently: Take 10 mg by mouth daily. ) 90 tablet 3  . aspirin 81 MG tablet Take 81 mg by mouth daily.    . fish oil-omega-3 fatty acids 1000 MG capsule Take 1 g by mouth daily.    Marland Kitchen glipiZIDE (GLUCOTROL XL) 10 MG 24 hr tablet TAKE ONE TABLET BY MOUTH ONCE DAILY (Patient taking differently: Take 10 mg by mouth daily. ) 90 tablet 3  . glucose blood (GLUCOSE METER TEST) test strip Use as instructed 100 each 3  . hydrochlorothiazide (HYDRODIURIL) 25 MG tablet Take 1 tablet (25 mg total) by mouth daily. 90 tablet 3  . Insulin Glargine (LANTUS) 100 UNIT/ML Solostar Pen Inject 10 Units into the skin daily at 10 pm. 15 mL 11  . Insulin Pen Needle 32G X 4 MM MISC Use to check home glucose as directed 100 each 3  . Lancets (ACCU-CHEK SOFT TOUCH) lancets Use as instructed 100 each 3  . losartan (COZAAR) 100 MG tablet Take 1 tablet (100 mg total) by mouth daily. 90 tablet 3  . metoprolol tartrate (LOPRESSOR) 25 MG tablet Take 1 tablet (25 mg total) by mouth 2 (two) times daily. 180 tablet 3  . pravastatin (PRAVACHOL) 40 MG tablet TAKE ONE TABLET BY MOUTH ONCE DAILY (Patient taking differently: Take 40 mg by mouth daily. ) 90 tablet 3  . sitaGLIPtin (JANUVIA) 50 MG tablet Take 1 tablet (50 mg total) by mouth daily. 30 tablet 12   No current facility-administered medications for this visit.    PHYSICAL EXAMINATION: ECOG PERFORMANCE STATUS: 0 - Asymptomatic  Vitals:   07/19/19 1033  BP: 137/87  Pulse: 71  Resp: 18  Temp: 97.8 F (36.6 C)  SpO2: 98%   Filed Weights   07/19/19 1033  Weight: 170 lb (77.1 kg)    GENERAL:alert, no distress and comfortable SKIN: skin color, texture, turgor are normal, no rashes or significant lesions.  No evidence of cellulitis.  He had a large sebaceous  cyst on the top of his skull EYES: normal, Conjunctiva are pink and non-injected, sclera clear OROPHARYNX:no exudate, no erythema and lips, buccal mucosa, and tongue normal  NECK: supple, thyroid normal size, non-tender, without nodularity LYMPH:  no palpable lymphadenopathy in the cervical, axillary or inguinal LUNGS: clear to auscultation and percussion with normal breathing effort HEART: regular rate & rhythm and no murmurs and no lower extremity edema ABDOMEN:abdomen soft, non-tender and normal bowel sounds Musculoskeletal:no cyanosis of digits and no clubbing  NEURO: alert & oriented x 3 with fluent speech, no focal motor/sensory deficits.  He has hoarseness of his voice, unchanged compared to previous exam  LABORATORY DATA:  I have reviewed the data as listed    Component Value Date/Time   NA 137 07/07/2019 0457   NA 142 11/02/2017 0833   NA 141 06/17/2017 0938   K 3.8 07/07/2019 0457   K 4.1 06/17/2017 0938   CL 105 07/07/2019 0457   CO2 21 (L) 07/07/2019 0457   CO2 25 06/17/2017 0938   GLUCOSE 109 (H) 07/07/2019 0457   GLUCOSE 144 (H) 06/17/2017 0938   BUN 38 (H) 07/07/2019 0457   BUN 31 (H)  11/02/2017 0833   BUN 30.5 (H) 06/17/2017 0938   CREATININE 2.21 (H) 07/07/2019 0457   CREATININE 2.2 (H) 06/17/2017 0938   CALCIUM 8.3 (L) 07/07/2019 0457   CALCIUM 9.3 06/17/2017 0938   PROT 6.2 (L) 07/17/2018 0800   PROT 6.4 11/02/2017 0833   PROT 7.1 06/17/2017 0938   ALBUMIN 3.7 07/17/2018 0800   ALBUMIN 4.2 11/02/2017 0833   ALBUMIN 4.1 06/17/2017 0938   AST 22 07/17/2018 0800   AST 25 06/17/2017 0938   ALT 29 07/17/2018 0800   ALT 31 06/17/2017 0938   ALKPHOS 69 07/17/2018 0800   ALKPHOS 76 06/17/2017 0938   BILITOT 0.7 07/17/2018 0800   BILITOT 0.4 11/02/2017 0833   BILITOT 0.68 06/17/2017 0938   GFRNONAA 29 (L) 07/07/2019 0457   GFRNONAA 33 (L) 09/11/2013 0920   GFRAA 34 (L) 07/07/2019 0457   GFRAA 38 (L) 09/11/2013 0920    No results found for: SPEP,  UPEP  Lab Results  Component Value Date   WBC 13.3 (H) 07/19/2019   NEUTROABS 10.4 (H) 07/19/2019   HGB 14.2 07/19/2019   HCT 42.8 07/19/2019   MCV 83.8 07/19/2019   PLT 350 07/19/2019      Chemistry      Component Value Date/Time   NA 137 07/07/2019 0457   NA 142 11/02/2017 0833   NA 141 06/17/2017 0938   K 3.8 07/07/2019 0457   K 4.1 06/17/2017 0938   CL 105 07/07/2019 0457   CO2 21 (L) 07/07/2019 0457   CO2 25 06/17/2017 0938   BUN 38 (H) 07/07/2019 0457   BUN 31 (H) 11/02/2017 0833   BUN 30.5 (H) 06/17/2017 0938   CREATININE 2.21 (H) 07/07/2019 0457   CREATININE 2.2 (H) 06/17/2017 0938   GLU 382 (H) 01/31/2015 1551      Component Value Date/Time   CALCIUM 8.3 (L) 07/07/2019 0457   CALCIUM 9.3 06/17/2017 0938   ALKPHOS 69 07/17/2018 0800   ALKPHOS 76 06/17/2017 0938   AST 22 07/17/2018 0800   AST 25 06/17/2017 0938   ALT 29 07/17/2018 0800   ALT 31 06/17/2017 0938   BILITOT 0.7 07/17/2018 0800   BILITOT 0.4 11/02/2017 0833   BILITOT 0.68 06/17/2017 0938       RADIOGRAPHIC STUDIES: I have personally reviewed the radiological images as listed and agreed with the findings in the report. DG Chest 2 View  Result Date: 06/25/2019 CLINICAL DATA:  Lung cancer history, left lung adenocarcinoma EXAM: CHEST - 2 VIEW COMPARISON:  Radiograph 04/05/2017, CT 07/13/2018 FINDINGS: Postsurgical changes from prior left upper lobectomy. There are atelectatic changes noted in the right lung base. Surgical clips noted in the right axilla, visualized previously. No consolidation, features of edema, pneumothorax, or effusion. The cardiomediastinal contours are unremarkable. No acute osseous abnormality. Mild gaseous distention of the stomach, nonspecific. IMPRESSION: 1. Postsurgical changes from prior left upper lobectomy. 2. Right basilar atelectatic changes. 3. No acute cardiopulmonary abnormality. 4. Mild gaseous distention of the stomach, nonspecific. Electronically Signed   By:  Lovena Le M.D.   On: 06/25/2019 19:56   DG Elbow Complete Left  Result Date: 07/06/2019 CLINICAL DATA:  Posterior elbow pain for several days following weight lifting, initial encounter EXAM: LEFT ELBOW - COMPLETE 3+ VIEW COMPARISON:  None. FINDINGS: Mild spurring is noted along the distal humerus both medially and laterally. Olecranon spurring is seen. No acute fracture or dislocation is noted. No soft tissue abnormality is seen. IMPRESSION: Mild degenerative change without acute  abnormality gross Electronically Signed   By: Inez Catalina M.D.   On: 07/06/2019 14:17   MR ELBOW LEFT WO CONTRAST  Result Date: 07/07/2019 CLINICAL DATA:  Four days of elbow pain and swelling EXAM: MRI OF THE LEFT ELBOW WITHOUT CONTRAST TECHNIQUE: Multiplanar, multisequence MR imaging of the elbow was performed. No intravenous contrast was administered. COMPARISON:  Left elbow x-ray 07/06/2019 FINDINGS: Technical note: Examination is degraded by motion artifact. TENDONS Common forearm flexor origin: Intact with mild tendinosis and enthesopathic changes. Common forearm extensor origin: Intact with enthesopathic changes. Biceps: Intact. Triceps: Intact with marked tendinosis and enthesopathic changes at the olecranon process. LIGAMENTS Medial stabilizers: Poorly evaluated, grossly intact. Lateral stabilizers:  Poorly evaluated, grossly intact. Cartilage: Diffuse cartilage thinning and surface irregularity. Joint: Small joint effusion. No dislocation. Cubital tunnel: Unremarkable. Bones: No acute fracture or bone marrow signal abnormality. Joint space narrowing with marginal osteophyte formation. Other: There is moderate soft tissue swelling with subcutaneous edema at the posterior and medial aspects of the elbow. No well-defined or drainable fluid collection. Intramuscular edema within the visualized distal triceps muscle belly. IMPRESSION: 1. Moderate soft tissue swelling with subcutaneous edema at the posterior and medial aspects  of the elbow. No well-defined or drainable fluid collection. 2. Small elbow joint effusion. This is a nonspecific finding and can be secondary to degenerative changes, inflammation, as well as infection. If there is clinical concern for septic arthropathy, arthrocentesis would be recommended. 3. Triceps tendinosis with intramuscular edema within the visualized distal triceps muscle. Findings could be due to muscle strain or myositis. 4. Moderate elbow osteoarthritis. 5. Mild tendinosis of the common flexor and extensor tendons with chronic enthesopathic changes. Electronically Signed   By: Davina Poke D.O.   On: 07/07/2019 11:20

## 2019-11-28 ENCOUNTER — Other Ambulatory Visit: Payer: Self-pay | Admitting: Physician Assistant

## 2019-11-28 DIAGNOSIS — R109 Unspecified abdominal pain: Secondary | ICD-10-CM

## 2019-12-29 ENCOUNTER — Ambulatory Visit
Admission: RE | Admit: 2019-12-29 | Discharge: 2019-12-29 | Disposition: A | Discharge status: Home / Self Care | Payer: Medicare Other | Source: Ambulatory Visit | Attending: Physician Assistant | Admitting: Physician Assistant

## 2019-12-29 ENCOUNTER — Other Ambulatory Visit: Payer: Self-pay | Admitting: Physician Assistant

## 2019-12-29 ENCOUNTER — Other Ambulatory Visit: Payer: Self-pay

## 2019-12-29 DIAGNOSIS — R109 Unspecified abdominal pain: Secondary | ICD-10-CM

## 2020-01-02 ENCOUNTER — Telehealth: Payer: Self-pay | Admitting: *Deleted

## 2020-01-02 NOTE — Telephone Encounter (Signed)
Per Dr.Gorsuch, called to schedule pt for upcoming appt on 7/6 at 1240 pm. Advised to arrive 15 min early for appt. Pt verbalized understanding

## 2020-01-08 ENCOUNTER — Other Ambulatory Visit: Payer: Self-pay

## 2020-01-08 ENCOUNTER — Inpatient Hospital Stay: Payer: Medicare Other | Attending: Hematology and Oncology | Admitting: Hematology and Oncology

## 2020-01-08 ENCOUNTER — Telehealth: Payer: Self-pay

## 2020-01-08 ENCOUNTER — Encounter: Payer: Self-pay | Admitting: Hematology and Oncology

## 2020-01-08 DIAGNOSIS — N2889 Other specified disorders of kidney and ureter: Secondary | ICD-10-CM | POA: Insufficient documentation

## 2020-01-08 DIAGNOSIS — Z85118 Personal history of other malignant neoplasm of bronchus and lung: Secondary | ICD-10-CM | POA: Insufficient documentation

## 2020-01-08 DIAGNOSIS — Z79899 Other long term (current) drug therapy: Secondary | ICD-10-CM | POA: Insufficient documentation

## 2020-01-08 DIAGNOSIS — Z7982 Long term (current) use of aspirin: Secondary | ICD-10-CM | POA: Diagnosis not present

## 2020-01-08 DIAGNOSIS — C8338 Diffuse large B-cell lymphoma, lymph nodes of multiple sites: Secondary | ICD-10-CM | POA: Diagnosis not present

## 2020-01-08 DIAGNOSIS — C3492 Malignant neoplasm of unspecified part of left bronchus or lung: Secondary | ICD-10-CM

## 2020-01-08 DIAGNOSIS — Z8572 Personal history of non-Hodgkin lymphomas: Secondary | ICD-10-CM | POA: Insufficient documentation

## 2020-01-08 DIAGNOSIS — Z9221 Personal history of antineoplastic chemotherapy: Secondary | ICD-10-CM | POA: Diagnosis not present

## 2020-01-08 NOTE — Assessment & Plan Note (Signed)
This is an unrelated diagnosis The MRI showed no evidence of lymphadenopathy I will see him in 6 months as scheduled

## 2020-01-08 NOTE — Assessment & Plan Note (Signed)
His last CT imaging which showed no evidence of disease He does not need adjuvant treatment I recommend discontinuation of routine surveillance imaging study with CT imaging I will continue chest x-ray once a year

## 2020-01-08 NOTE — Telephone Encounter (Signed)
-----   Message from Heath Lark, MD sent at 01/08/2020  1:00 PM EDT ----- Regarding: urgent referral to [facility] Notes are signed Pls call for referral

## 2020-01-08 NOTE — Assessment & Plan Note (Signed)
I have reviewed multiple imaging studies with the patient and his daughter Looking back retrospectively, the kidney mass might have been there before in January 2020 but not clearly identified due to lack of contrast Due to his history of lymphoma and lung cancer, he could be at risk of kidney cancer Benign lesions cannot be excluded I recommend urology consult He agrees He is educated to watch out for signs of flank pain and hematuria

## 2020-01-08 NOTE — Telephone Encounter (Signed)
Called and given urgent referral to [facility]. Info given to intake person and faxed referral information to [phone]. Received confirmation. They will call patient for appt.

## 2020-01-08 NOTE — Progress Notes (Signed)
[place] OFFICE PROGRESS NOTE  Patient Care Team: Harrison Mons, [place] as PCP - General (Family Medicine) Corliss Parish, MD as Consulting Physician (Nephrology) Fanny Skates, MD as Consulting Physician (General Surgery) Heath Lark, MD as Consulting Physician (Hematology and Oncology) Javier Glazier, MD as Consulting Physician (Pulmonary Disease) Juanita Craver, MD as Consulting Physician (Gastroenterology)  ASSESSMENT & PLAN:  Left kidney mass I have reviewed multiple imaging studies with the patient and his daughter Looking back retrospectively, the kidney mass might have been there before in January 2020 but not clearly identified due to lack of contrast Due to his history of lymphoma and lung cancer, he could be at risk of kidney cancer Benign lesions cannot be excluded I recommend urology consult He agrees He is educated to watch out for signs of flank pain and hematuria  Diffuse large B-cell lymphoma of lymph nodes of multiple regions [facility]) This is an unrelated diagnosis The MRI showed no evidence of lymphadenopathy I will see him in 6 months as scheduled  Adenocarcinoma of lung, stage 1, left ([place]) His last CT imaging which showed no evidence of disease He does not need adjuvant treatment I recommend discontinuation of routine surveillance imaging study with CT imaging I will continue chest x-ray once a year   Orders Placed This Encounter  Procedures  . Ambulatory referral to Urology    Referral Priority:   Urgent    Referral Type:   Consultation    Referral Reason:   Specialty Services Required    Requested Specialty:   Urology    Number of Visits Requested:   1    All questions were answered. The patient knows to call the clinic with any problems, questions or concerns. The total time spent in the appointment was 20 minutes encounter with patients including review of chart and various tests results, discussions about plan of care and  coordination of care plan   Heath Lark, MD 01/08/2020 12:58 PM  INTERVAL HISTORY: Please see below for problem oriented charting. He is here accompanied by his daughter He is seen because of recent abnormal MRI study He had MRI of the abdomen done for evaluation of abdominal bloating Since then, his symptoms have resolved MRI show abnormal kidney lesions He denies flank pain or hematuria  SUMMARY OF ONCOLOGIC HISTORY: Oncology History Overview Note  Lymphoma   Staging form: Lymphoid Neoplasms, AJCC 6th Edition     Clinical stage from 11/21/2014: Stage IV - Signed by Heath Lark, MD on 11/21/2014     Diffuse large B-cell lymphoma of lymph nodes of multiple regions ([place])  11/13/2014 Imaging   PEt scan showed widespread disease including possible splenic involvement   11/19/2014 Surgery   He has excisional LN biopsy and port placement   11/19/2014 Pathology Results   Biopsy of lymph nodes showed DLBCL   11/29/2014 - 01/31/2015 Chemotherapy   He received 4 cycles of RCHOP chemotherapy complicated by severe infusion reaction.   12/19/2014 - 01/09/2015 Chemotherapy   He received 2 cycles of prophylactic IT chemotherapy.   01/31/2015 Adverse Reaction   I reduce the dose of his chemotherapy, prednisone and dexamethasone due to poorly controlled diabetes and hyperglycemia   02/12/2015 Imaging    repeat PET CT scan show complete response to chemotherapy but new lung infiltrates   09/09/2015 Imaging   PET CT showed complete response except for new inflammatory change in the lung   03/01/2016 Imaging   CT chest showed mild interval growth of subsolid  left upper lobe pulmonary nodule, which was hypermetabolic on 16/01/3709 PET-CT. A primary bronchogenic adenocarcinoma cannot be excluded. Thoracic surgical consultation is advised. No lymphadenopathy or other findings of metastatic disease in the chest. Moderate emphysema with mild diffuse bronchial wall thickening, suggesting COPD. Aortic  atherosclerosis. Stable small pericardial effusion/thickening.   08/30/2016 PET scan   No evidence of hypermetabolic residual or recurrent lymphoma. 2. Interval left upper lobe wedge resection for adenocarcinoma. Healing left rib fractures which are likely postsurgical. 3. Aortic atherosclerosis.   02/25/2017 PET scan   1. No abnormal nodal enlargement or hypermetabolic activity to suggest recurrent lymphoma. Postoperative findings in the left chest without findings of recurrent left lung cancer. 2. Aortic Atherosclerosis (ICD10-I70.0) and Emphysema (ICD10-J43.9).   12/31/2019 Imaging   1. No noncontrast MR abnormality of the abdomen to explain abdominal swelling or bloating.    2. There is a mixed solid and cystic mass of the inferior pole of the left kidney measuring approximately 3.3 x 2.6 x 2.5 cm. This was possibly present on prior CT dated 07/13/2018 although very difficult to appreciate given lack of IV contrast. Findings are highly suspicious for renal cell carcinoma; assessment is generally limited by lack of intravenous contrast.   Adenocarcinoma of lung, stage 1, left ([place])  03/24/2016 Procedure   He underwent electromagnetic navigational bronchoscopy with needle aspirations, brushings, and biopsies.   03/24/2016 Pathology Results   Accession: [id number] biopsy was positive for adenocarcinoma   04/15/2016 Initial Diagnosis   Adenocarcinoma of lung, stage 1, left ([place])   04/15/2016 Surgery   He underwent left video-assisted thoracoscopy, thoracoscopic left upper lobectomy, mediastinal lymph node dissection, on-Q local anesthetic catheter placement   04/15/2016 Pathology Results   Accession: [id number] surgical specimen revealed adenocarcinoma, 2.2 cm with negative margin and negative LN involvement   07/08/2017 Imaging   1. No evidence of local tumor recurrence in the left hemithorax status post left upper lobectomy. 2. No evidence of recurrent lymphoma. No adenopathy. Normal  size spleen. 3. Solitary 5 mm solid pulmonary nodule in the superior segment right lower lobe, for which 16 month stability has been demonstrated, probably benign. 4. Subcentimeter low-attenuation lateral segment left liver lobe lesion, for which 16 month stability has been demonstrated, probably benign. 5. No evidence of bowel obstruction or acute bowel inflammation. 6. Chronic findings include: Aortic Atherosclerosis (ICD10-I70.0) and Emphysema (ICD10-J43.9). Mild-to-moderate prostatomegaly. Small fat containing left inguinal hernia.   07/13/2018 Imaging   1. Status post left upper lobectomy, without recurrent or metastatic disease. 2. Right axillary node dissection, without evidence of active lymphoma. 3. Aortic atherosclerosis (ICD10-I70.0) and emphysema (ICD10-J43.9). 4. Right lower lobe pulmonary nodule is similar and can be presumed benign.   06/25/2019 Imaging   CXR 1. Postsurgical changes from prior left upper lobectomy. 2. Right basilar atelectatic changes. 3. No acute cardiopulmonary abnormality. 4. Mild gaseous distention of the stomach, nonspecific.     REVIEW OF SYSTEMS:   Constitutional: Denies fevers, chills or abnormal weight loss Eyes: Denies blurriness of vision Ears, nose, mouth, throat, and face: Denies mucositis or sore throat Respiratory: Denies cough, dyspnea or wheezes Cardiovascular: Denies palpitation, chest discomfort or lower extremity swelling Gastrointestinal:  Denies nausea, heartburn or change in bowel habits Skin: Denies abnormal skin rashes Lymphatics: Denies new lymphadenopathy or easy bruising Neurological:Denies numbness, tingling or new weaknesses Behavioral/Psych: Mood is stable, no new changes  All other systems were reviewed with the patient and are negative.  I have reviewed the past medical history, past surgical history, social  history and family history with the patient and they are unchanged from previous note.  ALLERGIES:  is allergic  to ace inhibitors and rituximab.  MEDICATIONS:  Current Outpatient Medications  Medication Sig Dispense Refill  . amLODipine (NORVASC) 10 MG tablet TAKE ONE TABLET BY MOUTH ONCE DAILY (Patient taking differently: Take 10 mg by mouth daily. ) 90 tablet 3  . aspirin 81 MG tablet Take 81 mg by mouth daily.    . fish oil-omega-3 fatty acids 1000 MG capsule Take 1 g by mouth daily.    Marland Kitchen glipiZIDE (GLUCOTROL XL) 10 MG 24 hr tablet TAKE ONE TABLET BY MOUTH ONCE DAILY (Patient taking differently: Take 10 mg by mouth daily. ) 90 tablet 3  . glucose blood (GLUCOSE METER TEST) test strip Use as instructed 100 each 3  . hydrochlorothiazide (HYDRODIURIL) 25 MG tablet Take 1 tablet (25 mg total) by mouth daily. 90 tablet 3  . Insulin Glargine (LANTUS) 100 UNIT/ML Solostar Pen Inject 10 Units into the skin daily at 10 pm. 15 mL 11  . Insulin Pen Needle 32G X 4 MM MISC Use to check home glucose as directed 100 each 3  . Lancets (ACCU-CHEK SOFT TOUCH) lancets Use as instructed 100 each 3  . losartan (COZAAR) 100 MG tablet Take 1 tablet (100 mg total) by mouth daily. 90 tablet 3  . metoprolol tartrate (LOPRESSOR) 25 MG tablet Take 1 tablet (25 mg total) by mouth 2 (two) times daily. 180 tablet 3  . pravastatin (PRAVACHOL) 40 MG tablet TAKE ONE TABLET BY MOUTH ONCE DAILY (Patient taking differently: Take 40 mg by mouth daily. ) 90 tablet 3  . sitaGLIPtin (JANUVIA) 50 MG tablet Take 1 tablet (50 mg total) by mouth daily. 30 tablet 12   No current facility-administered medications for this visit.    PHYSICAL EXAMINATION: ECOG PERFORMANCE STATUS: 0 - Asymptomatic  Vitals:   01/08/20 1241  BP: (!) 163/62  Pulse: 65  Resp: 18  Temp: 97.8 F (36.6 C)  SpO2: 98%   Filed Weights   01/08/20 1241  Weight: 170 lb 9.6 oz (77.4 kg)    GENERAL:alert, no distress and comfortable Musculoskeletal:no cyanosis of digits and no clubbing  NEURO: alert & oriented x 3 with fluent speech, no focal motor/sensory  deficits  LABORATORY DATA:  I have reviewed the data as listed    Component Value Date/Time   NA 142 07/19/2019 1011   NA 142 11/02/2017 0833   NA 141 06/17/2017 0938   K 4.2 07/19/2019 1011   K 4.1 06/17/2017 0938   CL 107 07/19/2019 1011   CO2 25 07/19/2019 1011   CO2 25 06/17/2017 0938   GLUCOSE 190 (H) 07/19/2019 1011   GLUCOSE 144 (H) 06/17/2017 0938   BUN 35 (H) 07/19/2019 1011   BUN 31 (H) 11/02/2017 0833   BUN 30.5 (H) 06/17/2017 0938   CREATININE 2.18 (H) 07/19/2019 1011   CREATININE 2.2 (H) 06/17/2017 0938   CALCIUM 8.7 (L) 07/19/2019 1011   CALCIUM 9.3 06/17/2017 0938   PROT 6.6 07/19/2019 1011   PROT 6.4 11/02/2017 0833   PROT 7.1 06/17/2017 0938   ALBUMIN 3.8 07/19/2019 1011   ALBUMIN 4.2 11/02/2017 0833   ALBUMIN 4.1 06/17/2017 0938   AST 37 07/19/2019 1011   AST 25 06/17/2017 0938   ALT 64 (H) 07/19/2019 1011   ALT 31 06/17/2017 0938   ALKPHOS 66 07/19/2019 1011   ALKPHOS 76 06/17/2017 0938   BILITOT 0.5 07/19/2019 1011  BILITOT 0.4 11/02/2017 0833   BILITOT 0.68 06/17/2017 0938   GFRNONAA 30 (L) 07/19/2019 1011   GFRNONAA 33 (L) 09/11/2013 0920   GFRAA 34 (L) 07/19/2019 1011   GFRAA 38 (L) 09/11/2013 0920    No results found for: SPEP, UPEP  Lab Results  Component Value Date   WBC 13.3 (H) 07/19/2019   NEUTROABS 10.4 (H) 07/19/2019   HGB 14.2 07/19/2019   HCT 42.8 07/19/2019   MCV 83.8 07/19/2019   PLT 350 07/19/2019      Chemistry      Component Value Date/Time   NA 142 07/19/2019 1011   NA 142 11/02/2017 0833   NA 141 06/17/2017 0938   K 4.2 07/19/2019 1011   K 4.1 06/17/2017 0938   CL 107 07/19/2019 1011   CO2 25 07/19/2019 1011   CO2 25 06/17/2017 0938   BUN 35 (H) 07/19/2019 1011   BUN 31 (H) 11/02/2017 0833   BUN 30.5 (H) 06/17/2017 0938   CREATININE 2.18 (H) 07/19/2019 1011   CREATININE 2.2 (H) 06/17/2017 0938   GLU 382 (H) 01/31/2015 1551      Component Value Date/Time   CALCIUM 8.7 (L) 07/19/2019 1011   CALCIUM 9.3  06/17/2017 0938   ALKPHOS 66 07/19/2019 1011   ALKPHOS 76 06/17/2017 0938   AST 37 07/19/2019 1011   AST 25 06/17/2017 0938   ALT 64 (H) 07/19/2019 1011   ALT 31 06/17/2017 0938   BILITOT 0.5 07/19/2019 1011   BILITOT 0.4 11/02/2017 0833   BILITOT 0.68 06/17/2017 0938       RADIOGRAPHIC STUDIES: I have reviewed multiple imaging studies with the patient and his daughter I have personally reviewed the radiological images as listed and agreed with the findings in the report. MR ABDOMEN MRCP WO CONTRAST  Result Date: 12/29/2019 CLINICAL DATA:  Abdominal swelling and bloating for 6 weeks, history of non-small cell lung cancer and left upper lobectomy, history of lymphoma EXAM: MRI ABDOMEN WITHOUT CONTRAST  (INCLUDING MRCP) TECHNIQUE: Multiplanar multisequence MR imaging of the abdomen was performed. Heavily T2-weighted images of the biliary and pancreatic ducts were obtained, and three-dimensional MRCP images were rendered by post processing. COMPARISON:  CT chest abdomen pelvis, 07/13/2018 FINDINGS: Lower chest: No acute findings. Hepatobiliary: No mass or other parenchymal abnormality identified. No biliary ductal dilatation or filling defect Pancreas: No mass, inflammatory changes, or other parenchymal abnormality identified. No pancreatic ductal dilatation Spleen:  Within normal limits in size and appearance. Adrenals/Urinary Tract: There is a T1 and T2 hypointense mixed solid and cystic mass of the inferior pole of the left kidney measuring approximately 3.3 x 2.6 x 2.5 cm (series 6, image 27). This was possibly present on prior CT dated 07/13/2018 although very difficult to appreciate given lack of IV contrast. No evidence of hydronephrosis. Stomach/Bowel: Visualized portions within the abdomen are unremarkable. Vascular/Lymphatic: No pathologically enlarged lymph nodes identified. No abdominal aortic aneurysm demonstrated. Other:  None. Musculoskeletal: No suspicious bone lesions identified.  IMPRESSION: 1. No noncontrast MR abnormality of the abdomen to explain abdominal swelling or bloating. 2. There is a mixed solid and cystic mass of the inferior pole of the left kidney measuring approximately 3.3 x 2.6 x 2.5 cm. This was possibly present on prior CT dated 07/13/2018 although very difficult to appreciate given lack of IV contrast. Findings are highly suspicious for renal cell carcinoma; assessment is generally limited by lack of intravenous contrast. These results will be called to the ordering clinician or representative by  the Psychologist, clinical, and communication documented in the PACS or [employer]. Electronically Signed   By: Eddie Candle M.D.   On: 12/29/2019 10:54

## 2020-01-30 ENCOUNTER — Other Ambulatory Visit: Payer: Self-pay | Admitting: Urology

## 2020-01-30 DIAGNOSIS — D49512 Neoplasm of unspecified behavior of left kidney: Secondary | ICD-10-CM

## 2020-02-07 ENCOUNTER — Other Ambulatory Visit: Payer: Self-pay

## 2020-02-07 ENCOUNTER — Ambulatory Visit (HOSPITAL_COMMUNITY)
Admission: RE | Admit: 2020-02-07 | Discharge: 2020-02-07 | Disposition: A | Discharge status: Home / Self Care | Payer: Medicare Other | Source: Ambulatory Visit | Attending: Urology | Admitting: Urology

## 2020-02-07 DIAGNOSIS — D49512 Neoplasm of unspecified behavior of left kidney: Secondary | ICD-10-CM

## 2020-02-07 MED ORDER — GADOBUTROL 1 MMOL/ML IV SOLN
7.5000 mL | Freq: Once | INTRAVENOUS | Status: AC | PRN
  Administered 2020-02-07: 7.5 mL via INTRAVENOUS

## 2020-02-20 ENCOUNTER — Other Ambulatory Visit: Payer: Self-pay | Admitting: Urology

## 2020-02-28 ENCOUNTER — Telehealth: Payer: Self-pay | Admitting: Hematology and Oncology

## 2020-02-28 NOTE — Telephone Encounter (Signed)
Scheduled per 8/25 sch msg. Called and spoke with pt, confirmed 9/22 appt

## 2020-03-12 NOTE — Patient Instructions (Addendum)
DUE TO COVID-19 ONLY ONE VISITOR IS ALLOWED TO COME WITH YOU AND STAY IN THE WAITING ROOM ONLY DURING PRE OP AND PROCEDURE DAY OF SURGERY. THE 1 VISITOR  MAY VISIT WITH YOU AFTER SURGERY IN YOUR PRIVATE ROOM DURING VISITING HOURS ONLY!  YOU NEED TO HAVE A COVID 19 TEST ON_9/14______ @_9 :00______, THIS TEST MUST BE DONE BEFORE SURGERY,  COVID TESTING SITE [place] [id number], IT IS ON THE RIGHT GOING OUT [address] APPROXIMATELY  2 MINUTES PAST [employer] ON THE RIGHT. ONCE YOUR COVID TEST IS COMPLETED,  PLEASE BEGIN THE QUARANTINE INSTRUCTIONS AS OUTLINED IN YOUR HANDOUT.                [name] [name]    Your procedure is scheduled on: 03/21/20   Report to [facility] Main  Entrance   Report to Short Stay at 5:30 AM     Call this number if you have problems the morning of surgery [phone]    Remember: Do not eat food or drink liquids :After Midnight.   BRUSH YOUR TEETH MORNING OF SURGERY AND RINSE YOUR MOUTH OUT, NO CHEWING GUM CANDY OR MINTS.     Take these medicines the morning of surgery with A SIP OF WATER: Metoprolol, Amlodipine, Pepcid                                 You may not have any metal on your body including              piercings  Do not wear jewelry,  lotions, powders or deodorant                       Men may shave face and neck.   Do not bring valuables to the hospital. [place].  Contacts, dentures or bridgework may not be worn into surgery.       Patients discharged the day of surgery will not be allowed to drive home . IF YOU ARE HAVING SURGERY AND GOING HOME THE SAME DAY, YOU MUST HAVE AN ADULT TO DRIVE YOU HOME AND BE WITH YOU FOR 24 HOURS.  YOU MAY GO HOME BY TAXI OR UBER OR ORTHERWISE, BUT AN ADULT MUST ACCOMPANY YOU HOME AND STAY WITH YOU FOR 24 HOURS.  Name and phone number of your driver:  Special Instructions: N/A              Please read over  the following fact sheets you were given: _____________________________________________________________________             [facility] - Preparing for Surgery  Before surgery, you can play an important role.   Because skin is not sterile, your skin needs to be as free of germs as possible.   You can reduce the number of germs on your skin by washing with CHG (chlorahexidine gluconate) soap before surgery.   CHG is an antiseptic cleaner which kills germs and bonds with the skin to continue killing germs even after washing. Please DO NOT use if you have an allergy to CHG or antibacterial soaps .  If your skin becomes reddened/irritated stop using the CHG and inform your nurse when you arrive at Short Stay.  You may shave your face/neck.  Please follow  these instructions carefully:  1.  Shower with CHG Soap the night before surgery and the  morning of Surgery.  2.  If you choose to wash your hair, wash your hair first as usual with your  normal  shampoo.  3.  After you shampoo, rinse your hair and body thoroughly to remove the  shampoo.                                       4.  Use CHG as you would any other liquid soap.  You can apply chg directly  to the skin and wash                       Gently with a scrungie or clean washcloth.  5.  Apply the CHG Soap to your body ONLY FROM THE NECK DOWN.   Do not use on face/ open                           Wound or open sores. Avoid contact with eyes, ears mouth and genitals (private parts).                       Wash face,  Genitals (private parts) with your normal soap.             6.  Wash thoroughly, paying special attention to the area where your surgery  will be performed.  7.  Thoroughly rinse your body with warm water from the neck down.  8.  DO NOT shower/wash with your normal soap after using and rinsing off  the CHG Soap.             9.  Pat yourself dry with a clean towel.            10.  Wear clean pajamas.            11.  Place clean  sheets on your bed the night of your first shower and do not  sleep with pets. Day of Surgery : Do not apply any lotions/deodorants the morning of surgery.  Please wear clean clothes to the hospital/surgery center.  FAILURE TO FOLLOW THESE INSTRUCTIONS MAY RESULT IN THE CANCELLATION OF YOUR SURGERY PATIENT SIGNATURE_________________________________  NURSE SIGNATURE__________________________________  ________________________________________________________________________  WHAT IS A BLOOD TRANSFUSION? Blood Transfusion Information  A transfusion is the replacement of blood or some of its parts. Blood is made up of multiple cells which provide different functions.  Red blood cells carry oxygen and are used for blood loss replacement.  White blood cells fight against infection.  Platelets control bleeding.  Plasma helps clot blood.  Other blood products are available for specialized needs, such as hemophilia or other clotting disorders. BEFORE THE TRANSFUSION  Who gives blood for transfusions?   Healthy volunteers who are fully evaluated to make sure their blood is safe. This is blood bank blood. Transfusion therapy is the safest it has ever been in the practice of medicine. Before blood is taken from a donor, a complete history is taken to make sure that person has no history of diseases nor engages in risky social behavior (examples are intravenous drug use or sexual activity with multiple partners). The donor's travel history is screened to minimize risk of transmitting infections, such as malaria. The donated blood is tested for  signs of infectious diseases, such as HIV and hepatitis. The blood is then tested to be sure it is compatible with you in order to minimize the chance of a transfusion reaction. If you or a relative donates blood, this is often done in anticipation of surgery and is not appropriate for emergency situations. It takes many days to process the donated blood. RISKS  AND COMPLICATIONS Although transfusion therapy is very safe and saves many lives, the main dangers of transfusion include:   Getting an infectious disease.  Developing a transfusion reaction. This is an allergic reaction to something in the blood you were given. Every precaution is taken to prevent this. The decision to have a blood transfusion has been considered carefully by your caregiver before blood is given. Blood is not given unless the benefits outweigh the risks. AFTER THE TRANSFUSION  Right after receiving a blood transfusion, you will usually feel much better and more energetic. This is especially true if your red blood cells have gotten low (anemic). The transfusion raises the level of the red blood cells which carry oxygen, and this usually causes an energy increase.  The nurse administering the transfusion will monitor you carefully for complications. HOME CARE INSTRUCTIONS  No special instructions are needed after a transfusion. You may find your energy is better. Speak with your caregiver about any limitations on activity for underlying diseases you may have. SEEK MEDICAL CARE IF:   Your condition is not improving after your transfusion.  You develop redness or irritation at the intravenous (IV) site. SEEK IMMEDIATE MEDICAL CARE IF:  Any of the following symptoms occur over the next 12 hours:  Shaking chills.  You have a temperature by mouth above 102 F (38.9 C), not controlled by medicine.  Chest, back, or muscle pain.  People around you feel you are not acting correctly or are confused.  Shortness of breath or difficulty breathing.  Dizziness and fainting.  You get a rash or develop hives.  You have a decrease in urine output.  Your urine turns a dark color or changes to pink, red, or brown. Any of the following symptoms occur over the next 10 days:  You have a temperature by mouth above 102 F (38.9 C), not controlled by medicine.  Shortness of  breath.  Weakness after normal activity.  The white part of the eye turns yellow (jaundice).  You have a decrease in the amount of urine or are urinating less often.  Your urine turns a dark color or changes to pink, red, or brown. Document Released: 06/18/2000 Document Revised: 09/13/2011 Document Reviewed: 02/05/2008 ExitCare Patient Information 2014 [place], [place].  _____  Incentive Spirometer  An incentive spirometer is a tool that can help keep your lungs clear and active. This tool measures how well you are filling your lungs with each breath. Taking long deep breaths may help reverse or decrease the chance of developing breathing (pulmonary) problems (especially infection) following:  A long period of time when you are unable to move or be active. BEFORE THE PROCEDURE   If the spirometer includes an indicator to show your best effort, your nurse or respiratory therapist will set it to a desired goal.  If possible, sit up straight or lean slightly forward. Try not to slouch.  Hold the incentive spirometer in an upright position. INSTRUCTIONS FOR USE  1. Sit on the edge of your bed if possible, or sit up as far as you can in bed or on a chair. 2.  Hold the incentive spirometer in an upright position. 3. Breathe out normally. 4. Place the mouthpiece in your mouth and seal your lips tightly around it. 5. Breathe in slowly and as deeply as possible, raising the piston or the ball toward the top of the column. 6. Hold your breath for 3-5 seconds or for as long as possible. Allow the piston or ball to fall to the bottom of the column. 7. Remove the mouthpiece from your mouth and breathe out normally. 8. Rest for a few seconds and repeat Steps 1 through 7 at least 10 times every 1-2 hours when you are awake. Take your time and take a few normal breaths between deep breaths. 9. The spirometer may include an indicator to show your best effort. Use the indicator as a goal to work  toward during each repetition. 10. After each set of 10 deep breaths, practice coughing to be sure your lungs are clear. If you have an incision (the cut made at the time of surgery), support your incision when coughing by placing a pillow or rolled up towels firmly against it. Once you are able to get out of bed, walk around indoors and cough well. You may stop using the incentive spirometer when instructed by your caregiver.  RISKS AND COMPLICATIONS  Take your time so you do not get dizzy or light-headed.  If you are in pain, you may need to take or ask for pain medication before doing incentive spirometry. It is harder to take a deep breath if you are having pain. AFTER USE  Rest and breathe slowly and easily.  It can be helpful to keep track of a log of your progress. Your caregiver can provide you with a simple table to help with this. If you are using the spirometer at home, follow these instructions: [place] IF:   You are having difficultly using the spirometer.  You have trouble using the spirometer as often as instructed.  Your pain medication is not giving enough relief while using the spirometer.  You develop fever of 100.5 F (38.1 C) or higher. SEEK IMMEDIATE MEDICAL CARE IF:   You cough up bloody sputum that had not been present before.  You develop fever of 102 F (38.9 C) or greater.  You develop worsening pain at or near the incision site. MAKE SURE YOU:   Understand these instructions.  Will watch your condition.  Will get help right away if you are not doing well or get worse. Document Released: 11/01/2006 Document Revised: 09/13/2011 Document Reviewed: 01/02/2007 [facility] Patient Information 2014 [place], [place].  How to Manage Your Diabetes Before and After Surgery  Why is it important to control my blood sugar before and after surgery? . Improving blood sugar levels before and after surgery helps healing and can limit problems. . A way of  improving blood sugar control is eating a healthy diet by: o  Eating less sugar and carbohydrates o  Increasing activity/exercise o  Talking with your doctor about reaching your blood sugar goals . High blood sugars (greater than 180 mg/dL) can raise your risk of infections and slow your recovery, so you will need to focus on controlling your diabetes during the weeks before surgery. . Make sure that the doctor who takes care of your diabetes knows about your planned surgery including the date and location.  How do I manage my blood sugar before surgery? . Check your blood sugar at least 4 times a day, starting 2 days  before surgery, to make sure that the level is not too high or low. o Check your blood sugar the morning of your surgery when you wake up and every 2 hours until you get to the Short Stay unit. . If your blood sugar is less than 70 mg/dL, you will need to treat for low blood sugar: o Do not take insulin. o Treat a low blood sugar (less than 70 mg/dL) with  cup of clear juice (cranberry or apple), 4 glucose tablets, OR glucose gel. o Recheck blood sugar in 15 minutes after treatment (to make sure it is greater than 70 mg/dL). If your blood sugar is not greater than 70 mg/dL on recheck, call [phone] for further instructions. . Report your blood sugar to the short stay nurse when you get to Short Stay.  . If you are admitted to the hospital after surgery: o Your blood sugar will be checked by the staff and you will probably be given insulin after surgery (instead of oral diabetes medicines) to make sure you have good blood sugar levels. o The goal for blood sugar control after surgery is 80-180 mg/dL.   WHAT DO I DO ABOUT MY DIABETES MEDICATION?  Marland Kitchen Do not take oral diabetes medicines (pills) the morning of surgery.  . THE NIGHT BEFORE SURGERY, take   5  units of       insulin.       . THE MORNING OF SURGERY, take  0 units of         insulin.  . The day of surgery, do  not take other diabetes injectables, including Byetta (exenatide), Bydureon (exenatide ER), Victoza (liraglutide), or Trulicity (dulaglutide).  .      ________________________________________________________________________ __________________________________________________________________

## 2020-03-13 ENCOUNTER — Other Ambulatory Visit: Payer: Self-pay

## 2020-03-13 ENCOUNTER — Encounter (HOSPITAL_COMMUNITY)
Admission: RE | Admit: 2020-03-13 | Discharge: 2020-03-13 | Disposition: A | Discharge status: Home / Self Care | Payer: Medicare Other | Source: Ambulatory Visit | Attending: Urology | Admitting: Urology

## 2020-03-13 ENCOUNTER — Encounter (HOSPITAL_COMMUNITY): Payer: Self-pay

## 2020-03-13 DIAGNOSIS — N2889 Other specified disorders of kidney and ureter: Secondary | ICD-10-CM | POA: Diagnosis not present

## 2020-03-13 DIAGNOSIS — Z87891 Personal history of nicotine dependence: Secondary | ICD-10-CM | POA: Insufficient documentation

## 2020-03-13 DIAGNOSIS — Z01812 Encounter for preprocedural laboratory examination: Secondary | ICD-10-CM | POA: Diagnosis not present

## 2020-03-13 DIAGNOSIS — Z8572 Personal history of non-Hodgkin lymphomas: Secondary | ICD-10-CM | POA: Insufficient documentation

## 2020-03-13 DIAGNOSIS — Z85118 Personal history of other malignant neoplasm of bronchus and lung: Secondary | ICD-10-CM | POA: Insufficient documentation

## 2020-03-13 DIAGNOSIS — Z7982 Long term (current) use of aspirin: Secondary | ICD-10-CM | POA: Insufficient documentation

## 2020-03-13 DIAGNOSIS — I129 Hypertensive chronic kidney disease with stage 1 through stage 4 chronic kidney disease, or unspecified chronic kidney disease: Secondary | ICD-10-CM | POA: Diagnosis not present

## 2020-03-13 DIAGNOSIS — E1122 Type 2 diabetes mellitus with diabetic chronic kidney disease: Secondary | ICD-10-CM | POA: Insufficient documentation

## 2020-03-13 DIAGNOSIS — Z79899 Other long term (current) drug therapy: Secondary | ICD-10-CM | POA: Insufficient documentation

## 2020-03-13 DIAGNOSIS — N183 Chronic kidney disease, stage 3 unspecified: Secondary | ICD-10-CM | POA: Diagnosis not present

## 2020-03-13 DIAGNOSIS — Z794 Long term (current) use of insulin: Secondary | ICD-10-CM | POA: Insufficient documentation

## 2020-03-13 HISTORY — DX: Cardiac arrhythmia, unspecified: I49.9

## 2020-03-13 LAB — CBC
HCT: 44.9 % (ref 39.0–52.0)
Hemoglobin: 14.9 g/dL (ref 13.0–17.0)
MCH: 28.5 pg (ref 26.0–34.0)
MCHC: 33.2 g/dL (ref 30.0–36.0)
MCV: 85.9 fL (ref 80.0–100.0)
Platelets: 202 10*3/uL (ref 150–400)
RBC: 5.23 MIL/uL (ref 4.22–5.81)
RDW: 13.1 % (ref 11.5–15.5)
WBC: 10.3 10*3/uL (ref 4.0–10.5)
nRBC: 0 % (ref 0.0–0.2)

## 2020-03-13 LAB — COMPREHENSIVE METABOLIC PANEL
ALT: 22 U/L (ref 0–44)
AST: 25 U/L (ref 15–41)
Albumin: 4 g/dL (ref 3.5–5.0)
Alkaline Phosphatase: 56 U/L (ref 38–126)
Anion gap: 9 (ref 5–15)
BUN: 33 mg/dL — ABNORMAL HIGH (ref 8–23)
CO2: 24 mmol/L (ref 22–32)
Calcium: 9.1 mg/dL (ref 8.9–10.3)
Chloride: 106 mmol/L (ref 98–111)
Creatinine, Ser: 2.06 mg/dL — ABNORMAL HIGH (ref 0.61–1.24)
GFR calc Af Amer: 36 mL/min — ABNORMAL LOW (ref >60)
GFR calc non Af Amer: 31 mL/min — ABNORMAL LOW (ref >60)
Glucose, Bld: 124 mg/dL — ABNORMAL HIGH (ref 70–99)
Potassium: 3.7 mmol/L (ref 3.5–5.1)
Sodium: 139 mmol/L (ref 135–145)
Total Bilirubin: 0.8 mg/dL (ref 0.3–1.2)
Total Protein: 6.8 g/dL (ref 6.5–8.1)

## 2020-03-13 LAB — GLUCOSE, CAPILLARY: Glucose-Capillary: 130 mg/dL — ABNORMAL HIGH (ref 70–99)

## 2020-03-13 NOTE — Progress Notes (Signed)
COVID Vaccine Completed:Yes Date COVID Vaccine completed:09/17/19 COVID vaccine manufacturer: [place]      PCP - [facility] Cardiologist - no  Chest x-ray - 06/25/19 EKG - 07/10/19 Stress Test - 2009 [facility] - 21016 Cardiac Cath - no  Sleep Study - no CPAP -   Fasting Blood Sugar - 60-150 Checks Blood Sugar _QD____ times a day  Blood Thinner Instructions:ASA/[place] Aspirin Instructions:Stop 5 days prior to DOS/ Louis Meckel Last Dose:03/15/20  Anesthesia review:   Patient denies shortness of breath, fever, cough and chest pain at PAT appointment yes   Patient verbalized understanding of instructions that were given to them at the PAT appointment. Patient was also instructed that they will need to review over the PAT instructions again at home before surgery.yes  Pt had upper lt lobectomy in 2017. He reports having SOB while working hard in the yard but none climbing stairs or with ADLs.

## 2020-03-14 LAB — URINE CULTURE: Culture: NO GROWTH

## 2020-03-18 ENCOUNTER — Other Ambulatory Visit (HOSPITAL_COMMUNITY)
Admission: RE | Admit: 2020-03-18 | Discharge: 2020-03-18 | Disposition: A | Discharge status: Home / Self Care | Payer: Medicare Other | Source: Ambulatory Visit | Attending: Urology | Admitting: Urology

## 2020-03-18 DIAGNOSIS — Z20822 Contact with and (suspected) exposure to covid-19: Secondary | ICD-10-CM | POA: Diagnosis not present

## 2020-03-18 DIAGNOSIS — Z01812 Encounter for preprocedural laboratory examination: Secondary | ICD-10-CM | POA: Diagnosis present

## 2020-03-18 LAB — SARS CORONAVIRUS 2 (TAT 6-24 HRS): SARS Coronavirus 2: NEGATIVE

## 2020-03-18 NOTE — Progress Notes (Signed)
Anesthesia Chart Review   Case: [id number] Date/Time: 03/21/20 0700   Procedure: XI ROBOTIC ASSITED LEFT PARTIAL NEPHRECTOMY (Left )   Anesthesia type: General   Pre-op diagnosis: LEFT LOWER POLE RENAL MASS   Location: [facility] ROOM 03 / [facility]   Surgeons: Ardis Hughs, MD      DISCUSSION:69 y.o. former smoker (50 pack years, quit 07/05/05) with h/o HTN, CKD Stage III, DM II, diffuse large B-cell lymphoma, left lung cancer s/p upper lobectomy, left lower pole renal mass scheduled for above procedure 03/21/2020 with Dr. Louis Meckel.   Last seen by PCP 02/15/2020, stable at this visit.  PCP aware of upcoming procedure.   Anticipate pt can proceed with planned procedure barring acute status change.   VS: BP (!) 162/77   Pulse 63   Temp 36.8 C (Oral)   Resp 16   Ht 5\' 10"  (1.778 m)   Wt 78.9 kg   SpO2 99%   BMI 24.97 kg/m   PROVIDERS: Harrison Mons, PA is PCP    LABS: Labs reviewed: Acceptable for surgery. (all labs ordered are listed, but only abnormal results are displayed)  Labs Reviewed  COMPREHENSIVE METABOLIC PANEL - Abnormal; Notable for the following components:      Result Value   Glucose, Bld 124 (*)    BUN 33 (*)    Creatinine, Ser 2.06 (*)    GFR calc non Af Amer 31 (*)    GFR calc Af Amer 36 (*)    All other components within normal limits  GLUCOSE, CAPILLARY - Abnormal; Notable for the following components:   Glucose-Capillary 130 (*)    All other components within normal limits  URINE CULTURE  CBC  TYPE AND SCREEN     IMAGES:   EKG: 07/10/2019 Rate 83 bpm  Sinus rhythm with frequent premature ventricular complexes and premature atrial complexes    CV: [place] 11/27/2014 Study Conclusions   - Left ventricle: Strain parameters are normal:  Global longitudinal strain: - 18.8%  Lateral S prime: 12 cm/sec. The cavity size was normal. Systolic  function was normal. The estimated ejection fraction was in the  range of 55% to 60%. Wall  motion was normal; there were no  regional wall motion abnormalities. Doppler parameters are  consistent with abnormal left ventricular relaxation (grade 1  diastolic dysfunction). There was no evidence of elevated  ventricular filling pressure by Doppler parameters.  - Aortic valve: There was trivial regurgitation.  - Aortic root: The aortic root was normal in size.  - Mitral valve: Structurally normal valve. There was mild  regurgitation.  - Right ventricle: Systolic function was normal.  - Right atrium: The atrium was normal in size.  - Tricuspid valve: There was mild regurgitation.  - Pulmonic valve: There was no regurgitation.  - Pericardium, extracardiac: Features were not consistent with  tamponade physiology.   Impressions:   - Normal biventricular size and function. Impaired relaxation.  Normal strain parameters.  Past Medical History:  Diagnosis Date  . Anemia    with chemo  . Cancer ([place]) 2016   lymphoma  . Chronic kidney disease (CKD), stage III (moderate)   . Chronic kidney disease, stage III (moderate)   . Cramps, extremity    hx of in legs bilat; currently having issues in toes bilat   . Diabetes ([place])   . Dysrhythmia    PVC  . Erectile dysfunction   . Essential hypertension, benign   . History of ETOH abuse  quit 1999  . History of tobacco abuse    quit 12/2008  . Hyperparathyroidism ([place])    secondary / renal per H&P with [facility] 07/17/2014   . Pulmonary infiltrate present on computed tomography 02/20/2015  . Shortness of breath dyspnea    exertion  . Type II or unspecified type diabetes mellitus with renal manifestations, not stated as uncontrolled(250.40)     Past Surgical History:  Procedure Laterality Date  . ARM WOUND REPAIR / CLOSURE     chainsaw; left forearm/wrist  . AXILLARY LYMPH NODE BIOPSY Right 11/19/2014   Procedure: EXCISIONAL BIOPSY DEEP RIGHT AXILLARY LYMPH NODE ;  Surgeon: Fanny Skates, MD;  Location: [facility];   Service: General;  Laterality: Right;  . colonscopy     . PORTACATH PLACEMENT N/A 11/19/2014   Procedure: INSERTION PORT-A-CATH; rt chest Surgeon: Fanny Skates, MD;  Location: [facility];  Service: General;  Laterality: N/A;  . VIDEO ASSISTED THORACOSCOPY (VATS)/ LOBECTOMY Left 04/14/2016   Procedure: VIDEO ASSISTED THORACOSCOPY (VATS)/LEFT UPPER LOBECTOMY;  Surgeon: Melrose Nakayama, MD;  Location: [facility];  Service: Thoracic;  Laterality: Left;  Marland Kitchen VIDEO BRONCHOSCOPY WITH ENDOBRONCHIAL NAVIGATION Left 03/24/2016   Procedure: VIDEO BRONCHOSCOPY WITH ENDOBRONCHIAL NAVIGATION;  Surgeon: Melrose Nakayama, MD;  Location: [place];  Service: Thoracic;  Laterality: Left;    MEDICATIONS: . amLODipine (NORVASC) 10 MG tablet  . aspirin 81 MG tablet  . famotidine (PEPCID) 20 MG tablet  . fish oil-omega-3 fatty acids 1000 MG capsule  . glipiZIDE (GLUCOTROL XL) 10 MG 24 hr tablet  . glucose blood (GLUCOSE METER TEST) test strip  . hydrochlorothiazide (HYDRODIURIL) 25 MG tablet  . Insulin Glargine (LANTUS) 100 UNIT/ML Solostar Pen  . Insulin Pen Needle 32G X 4 MM MISC  . Lancets (ACCU-CHEK SOFT TOUCH) lancets  . losartan (COZAAR) 100 MG tablet  . metoprolol tartrate (LOPRESSOR) 25 MG tablet  . pravastatin (PRAVACHOL) 40 MG tablet  . sitaGLIPtin (JANUVIA) 50 MG tablet   No current facility-administered medications for this encounter.    Konrad Felix, PA-C [facility] [phone]

## 2020-03-20 NOTE — Anesthesia Preprocedure Evaluation (Addendum)
Anesthesia Evaluation  Patient identified by MRN, date of birth, ID band Patient awake    Reviewed: Allergy & Precautions, NPO status , Patient's Chart, lab work & pertinent test results  History of Anesthesia Complications Negative for: history of anesthetic complications  Airway Mallampati: II  TM Distance: >3 FB Neck ROM: Full    Dental  (+) Poor Dentition, Missing, Dental Advisory Given, Loose   Pulmonary COPD, former smoker,  Hx of Lung Cancer   Pulmonary exam normal        Cardiovascular hypertension, Pt. on medications and Pt. on home beta blockers Normal cardiovascular exam  Impressions:   - Normal biventricular size and function. Impaired relaxation.  Normal strain parameters.    Neuro/Psych negative neurological ROS     GI/Hepatic negative GI ROS, (+)     substance abuse  alcohol use,   Endo/Other  diabetes  Renal/GU Renal InsufficiencyRenal disease     Musculoskeletal   Abdominal   Peds  Hematology negative hematology ROS (+) Hx Lymphona   Anesthesia Other Findings   Reproductive/Obstetrics                           Anesthesia Physical Anesthesia Plan  ASA: III  Anesthesia Plan: General   Post-op Pain Management:    Induction: Intravenous  PONV Risk Score and Plan: 4 or greater and Ondansetron, Dexamethasone, Midazolam and Diphenhydramine  Airway Management Planned: Oral ETT  Additional Equipment:   Intra-op Plan:   Post-operative Plan: Extubation in OR  Informed Consent: I have reviewed the patients History and Physical, chart, labs and discussed the procedure including the risks, benefits and alternatives for the proposed anesthesia with the patient or authorized representative who has indicated his/her understanding and acceptance.     Dental advisory given  Plan Discussed with: CRNA and Anesthesiologist  Anesthesia Plan Comments:         Anesthesia Quick Evaluation

## 2020-03-21 ENCOUNTER — Encounter (HOSPITAL_COMMUNITY)
Admission: RE | Disposition: A | Discharge status: Home / Self Care | Payer: Self-pay | Source: Other Acute Inpatient Hospital | Attending: Urology

## 2020-03-21 ENCOUNTER — Other Ambulatory Visit: Payer: Self-pay

## 2020-03-21 ENCOUNTER — Encounter (HOSPITAL_COMMUNITY): Payer: Self-pay | Admitting: Urology

## 2020-03-21 ENCOUNTER — Ambulatory Visit (HOSPITAL_COMMUNITY): Payer: Medicare Other | Admitting: Anesthesiology

## 2020-03-21 ENCOUNTER — Ambulatory Visit (HOSPITAL_COMMUNITY): Payer: Medicare Other | Admitting: Physician Assistant

## 2020-03-21 ENCOUNTER — Observation Stay (HOSPITAL_COMMUNITY)
Admission: RE | Admit: 2020-03-21 | Discharge: 2020-03-23 | Disposition: A | Discharge status: Home / Self Care | Payer: Medicare Other | Source: Other Acute Inpatient Hospital | Attending: Urology | Admitting: Urology

## 2020-03-21 DIAGNOSIS — Z794 Long term (current) use of insulin: Secondary | ICD-10-CM | POA: Diagnosis not present

## 2020-03-21 DIAGNOSIS — E119 Type 2 diabetes mellitus without complications: Secondary | ICD-10-CM | POA: Insufficient documentation

## 2020-03-21 DIAGNOSIS — Z7982 Long term (current) use of aspirin: Secondary | ICD-10-CM | POA: Insufficient documentation

## 2020-03-21 DIAGNOSIS — N2889 Other specified disorders of kidney and ureter: Secondary | ICD-10-CM | POA: Diagnosis not present

## 2020-03-21 DIAGNOSIS — I10 Essential (primary) hypertension: Secondary | ICD-10-CM | POA: Diagnosis not present

## 2020-03-21 DIAGNOSIS — Z79899 Other long term (current) drug therapy: Secondary | ICD-10-CM | POA: Insufficient documentation

## 2020-03-21 DIAGNOSIS — Z87891 Personal history of nicotine dependence: Secondary | ICD-10-CM | POA: Diagnosis not present

## 2020-03-21 DIAGNOSIS — C642 Malignant neoplasm of left kidney, except renal pelvis: Principal | ICD-10-CM | POA: Insufficient documentation

## 2020-03-21 HISTORY — PX: ROBOTIC ASSITED PARTIAL NEPHRECTOMY: SHX6087

## 2020-03-21 LAB — GLUCOSE, CAPILLARY
Glucose-Capillary: 124 mg/dL — ABNORMAL HIGH (ref 70–99)
Glucose-Capillary: 179 mg/dL — ABNORMAL HIGH (ref 70–99)
Glucose-Capillary: 256 mg/dL — ABNORMAL HIGH (ref 70–99)
Glucose-Capillary: 154 mg/dL — ABNORMAL HIGH (ref 70–99)

## 2020-03-21 LAB — HEMOGLOBIN A1C
Hgb A1c MFr Bld: 6.7 % — ABNORMAL HIGH (ref 4.8–5.6)
Mean Plasma Glucose: 145.59 mg/dL

## 2020-03-21 LAB — TYPE AND SCREEN
ABO/RH(D): O POS
Antibody Screen: NEGATIVE

## 2020-03-21 LAB — HEMOGLOBIN AND HEMATOCRIT, BLOOD
HCT: 40.8 % (ref 39.0–52.0)
Hemoglobin: 13.7 g/dL (ref 13.0–17.0)

## 2020-03-21 SURGERY — NEPHRECTOMY, PARTIAL, ROBOT-ASSISTED
Anesthesia: General | Laterality: Left

## 2020-03-21 MED ORDER — ONDANSETRON HCL 4 MG/2ML IJ SOLN: 4.0000 mg | INTRAMUSCULAR | Status: DC | PRN

## 2020-03-21 MED ORDER — ROCURONIUM BROMIDE 10 MG/ML (PF) SYRINGE
PREFILLED_SYRINGE | INTRAVENOUS | Status: AC
  Filled 2020-03-21: qty 10

## 2020-03-21 MED ORDER — TRAMADOL HCL 50 MG PO TABS: 50.0000 mg | ORAL_TABLET | Freq: Four times a day (QID) | ORAL | 0 refills | Status: AC | PRN

## 2020-03-21 MED ORDER — CEFAZOLIN SODIUM-DEXTROSE 2-4 GM/100ML-% IV SOLN
2.0000 g | INTRAVENOUS | Status: AC
  Administered 2020-03-21: 2 g via INTRAVENOUS
  Filled 2020-03-21: qty 100

## 2020-03-21 MED ORDER — LIDOCAINE 2% (20 MG/ML) 5 ML SYRINGE
INTRAMUSCULAR | Status: AC
  Filled 2020-03-21: qty 5

## 2020-03-21 MED ORDER — FENTANYL CITRATE (PF) 250 MCG/5ML IJ SOLN
INTRAMUSCULAR | Status: DC | PRN
Start: 2020-03-21 — End: 2020-03-21
  Administered 2020-03-21: 50 ug via INTRAVENOUS
  Administered 2020-03-21: 100 ug via INTRAVENOUS
  Administered 2020-03-21 (×2): 50 ug via INTRAVENOUS

## 2020-03-21 MED ORDER — DEXAMETHASONE SODIUM PHOSPHATE 10 MG/ML IJ SOLN
INTRAMUSCULAR | Status: AC
  Filled 2020-03-21: qty 1

## 2020-03-21 MED ORDER — ALBUMIN HUMAN 5 % IV SOLN
INTRAVENOUS | Status: AC
  Filled 2020-03-21: qty 500

## 2020-03-21 MED ORDER — ALBUMIN HUMAN 5 % IV SOLN: INTRAVENOUS | Status: DC | PRN

## 2020-03-21 MED ORDER — DIPHENHYDRAMINE HCL 50 MG/ML IJ SOLN: 12.5000 mg | Freq: Four times a day (QID) | INTRAMUSCULAR | Status: DC | PRN

## 2020-03-21 MED ORDER — ORAL CARE MOUTH RINSE: 15.0000 mL | Freq: Once | OROMUCOSAL | Status: AC

## 2020-03-21 MED ORDER — DIPHENHYDRAMINE HCL 50 MG/ML IJ SOLN
INTRAMUSCULAR | Status: DC | PRN
  Administered 2020-03-21: 25 mg via INTRAVENOUS

## 2020-03-21 MED ORDER — AMLODIPINE BESYLATE 10 MG PO TABS
10.0000 mg | ORAL_TABLET | Freq: Every day | ORAL | Status: DC
  Administered 2020-03-21 – 2020-03-22 (×2): 10 mg via ORAL
  Filled 2020-03-21 (×2): qty 1

## 2020-03-21 MED ORDER — BELLADONNA ALKALOIDS-OPIUM 16.2-60 MG RE SUPP: 1.0000 | Freq: Four times a day (QID) | RECTAL | Status: DC | PRN

## 2020-03-21 MED ORDER — FENTANYL CITRATE (PF) 100 MCG/2ML IJ SOLN: 25.0000 ug | INTRAMUSCULAR | Status: DC | PRN

## 2020-03-21 MED ORDER — DIPHENHYDRAMINE HCL 50 MG/ML IJ SOLN
INTRAMUSCULAR | Status: AC
  Filled 2020-03-21: qty 1

## 2020-03-21 MED ORDER — ONDANSETRON HCL 4 MG/2ML IJ SOLN
INTRAMUSCULAR | Status: DC | PRN
  Administered 2020-03-21: 4 mg via INTRAVENOUS

## 2020-03-21 MED ORDER — BACITRACIN-NEOMYCIN-POLYMYXIN 400-5-5000 EX OINT: 1.0000 "application " | TOPICAL_OINTMENT | Freq: Three times a day (TID) | CUTANEOUS | Status: DC | PRN

## 2020-03-21 MED ORDER — FAMOTIDINE 20 MG PO TABS: 20.0000 mg | ORAL_TABLET | Freq: Every day | ORAL | Status: DC | PRN

## 2020-03-21 MED ORDER — LIDOCAINE HCL (CARDIAC) PF 100 MG/5ML IV SOSY
PREFILLED_SYRINGE | INTRAVENOUS | Status: DC | PRN
  Administered 2020-03-21: 100 mg via INTRAVENOUS

## 2020-03-21 MED ORDER — LACTATED RINGERS IV SOLN: INTRAVENOUS | Status: DC

## 2020-03-21 MED ORDER — PROMETHAZINE HCL 25 MG/ML IJ SOLN: 6.2500 mg | INTRAMUSCULAR | Status: DC | PRN

## 2020-03-21 MED ORDER — SODIUM CHLORIDE 0.45 % IV SOLN: INTRAVENOUS | Status: DC

## 2020-03-21 MED ORDER — BUPIVACAINE-EPINEPHRINE (PF) 0.5% -1:200000 IJ SOLN
INTRAMUSCULAR | Status: AC
  Filled 2020-03-21: qty 30

## 2020-03-21 MED ORDER — CHLORHEXIDINE GLUCONATE CLOTH 2 % EX PADS: 6.0000 | MEDICATED_PAD | Freq: Every day | CUTANEOUS | Status: DC

## 2020-03-21 MED ORDER — MIDAZOLAM HCL 2 MG/2ML IJ SOLN
INTRAMUSCULAR | Status: AC
  Filled 2020-03-21: qty 2

## 2020-03-21 MED ORDER — MIDAZOLAM HCL 2 MG/2ML IJ SOLN
INTRAMUSCULAR | Status: DC | PRN
  Administered 2020-03-21: 2 mg via INTRAVENOUS

## 2020-03-21 MED ORDER — SODIUM CHLORIDE (PF) 0.9 % IJ SOLN
INTRAMUSCULAR | Status: AC
  Filled 2020-03-21: qty 20

## 2020-03-21 MED ORDER — DOCUSATE SODIUM 100 MG PO CAPS
100.0000 mg | ORAL_CAPSULE | Freq: Two times a day (BID) | ORAL | Status: DC
  Administered 2020-03-21 – 2020-03-22 (×3): 100 mg via ORAL
  Filled 2020-03-21 (×3): qty 1

## 2020-03-21 MED ORDER — STERILE WATER FOR IRRIGATION IR SOLN
Status: DC | PRN
  Administered 2020-03-21: 1000 mL

## 2020-03-21 MED ORDER — ACETAMINOPHEN 10 MG/ML IV SOLN
1000.0000 mg | Freq: Four times a day (QID) | INTRAVENOUS | Status: AC
  Administered 2020-03-21 – 2020-03-22 (×2): 1000 mg via INTRAVENOUS
  Filled 2020-03-21 (×2): qty 100

## 2020-03-21 MED ORDER — PRAVASTATIN SODIUM 40 MG PO TABS
40.0000 mg | ORAL_TABLET | Freq: Every day | ORAL | Status: DC
  Administered 2020-03-21 – 2020-03-22 (×2): 40 mg via ORAL
  Filled 2020-03-21 (×2): qty 1

## 2020-03-21 MED ORDER — BUPIVACAINE LIPOSOME 1.3 % IJ SUSP
20.0000 mL | Freq: Once | INTRAMUSCULAR | Status: AC
  Administered 2020-03-21: 20 mL
  Filled 2020-03-21: qty 20

## 2020-03-21 MED ORDER — ROCURONIUM BROMIDE 10 MG/ML (PF) SYRINGE
PREFILLED_SYRINGE | INTRAVENOUS | Status: DC | PRN
  Administered 2020-03-21: 100 mg via INTRAVENOUS
  Administered 2020-03-21: 10 mg via INTRAVENOUS

## 2020-03-21 MED ORDER — CHLORHEXIDINE GLUCONATE 0.12 % MT SOLN
15.0000 mL | Freq: Once | OROMUCOSAL | Status: AC
  Administered 2020-03-21: 15 mL via OROMUCOSAL

## 2020-03-21 MED ORDER — LACTATED RINGERS IV SOLN: INTRAVENOUS | Status: DC | PRN

## 2020-03-21 MED ORDER — SUGAMMADEX SODIUM 200 MG/2ML IV SOLN
INTRAVENOUS | Status: DC | PRN
  Administered 2020-03-21: 200 mg via INTRAVENOUS

## 2020-03-21 MED ORDER — METOPROLOL TARTRATE 25 MG PO TABS
25.0000 mg | ORAL_TABLET | Freq: Two times a day (BID) | ORAL | Status: DC
  Administered 2020-03-21 – 2020-03-22 (×3): 25 mg via ORAL
  Filled 2020-03-21 (×4): qty 1

## 2020-03-21 MED ORDER — DIPHENHYDRAMINE HCL 12.5 MG/5ML PO ELIX: 12.5000 mg | ORAL_SOLUTION | Freq: Four times a day (QID) | ORAL | Status: DC | PRN

## 2020-03-21 MED ORDER — INSULIN ASPART 100 UNIT/ML ~~LOC~~ SOLN
0.0000 [IU] | Freq: Three times a day (TID) | SUBCUTANEOUS | Status: DC
  Administered 2020-03-21: 8 [IU] via SUBCUTANEOUS
  Administered 2020-03-22 (×2): 2 [IU] via SUBCUTANEOUS
  Administered 2020-03-23: 3 [IU] via SUBCUTANEOUS

## 2020-03-21 MED ORDER — DEXAMETHASONE SODIUM PHOSPHATE 10 MG/ML IJ SOLN
INTRAMUSCULAR | Status: DC | PRN
  Administered 2020-03-21: 8 mg via INTRAVENOUS

## 2020-03-21 MED ORDER — ACETAMINOPHEN 500 MG PO TABS
1000.0000 mg | ORAL_TABLET | Freq: Once | ORAL | Status: AC
  Administered 2020-03-21: 1000 mg via ORAL
  Filled 2020-03-21: qty 2

## 2020-03-21 MED ORDER — METOPROLOL TARTRATE 25 MG PO TABS
25.0000 mg | ORAL_TABLET | Freq: Once | ORAL | Status: AC
  Administered 2020-03-21: 25 mg via ORAL
  Filled 2020-03-21: qty 1

## 2020-03-21 MED ORDER — BUPIVACAINE-EPINEPHRINE 0.5% -1:200000 IJ SOLN
INTRAMUSCULAR | Status: DC | PRN
  Administered 2020-03-21: 30 mL

## 2020-03-21 MED ORDER — PROPOFOL 10 MG/ML IV BOLUS
INTRAVENOUS | Status: DC | PRN
  Administered 2020-03-21: 160 mg via INTRAVENOUS

## 2020-03-21 MED ORDER — LACTATED RINGERS IR SOLN
Status: DC | PRN
  Administered 2020-03-21: 1000 mL

## 2020-03-21 MED ORDER — HYDROMORPHONE HCL 1 MG/ML IJ SOLN: 0.5000 mg | INTRAMUSCULAR | Status: DC | PRN

## 2020-03-21 MED ORDER — TRAMADOL HCL 50 MG PO TABS: 50.0000 mg | ORAL_TABLET | Freq: Four times a day (QID) | ORAL | Status: DC | PRN

## 2020-03-21 MED ORDER — PROPOFOL 10 MG/ML IV BOLUS
INTRAVENOUS | Status: AC
  Filled 2020-03-21: qty 20

## 2020-03-21 MED ORDER — FENTANYL CITRATE (PF) 250 MCG/5ML IJ SOLN
INTRAMUSCULAR | Status: AC
  Filled 2020-03-21: qty 5

## 2020-03-21 SURGICAL SUPPLY — 75 items
ADH SKN CLS APL DERMABOND .7 (GAUZE/BANDAGES/DRESSINGS) ×1
AGENT HMST KT MTR STRL THRMB (HEMOSTASIS) ×2
APL ESCP 34 STRL LF DISP (HEMOSTASIS) ×1
APL PRP STRL LF DISP 70% ISPRP (MISCELLANEOUS) ×1
APL SRG 38 LTWT LNG FL B (MISCELLANEOUS)
APPLICATOR ARISTA FLEXITIP XL (MISCELLANEOUS) IMPLANT
APPLICATOR SURGIFLO ENDO (HEMOSTASIS) ×2 IMPLANT
BAG SPEC RTRVL LRG 6X4 10 (ENDOMECHANICALS) ×1
CHLORAPREP W/TINT 26 (MISCELLANEOUS) ×2 IMPLANT
CLIP VESOLOCK LG 6/CT PURPLE (CLIP) ×2 IMPLANT
CLIP VESOLOCK MED LG 6/CT (CLIP) ×5 IMPLANT
CLIP VESOLOCK XL 6/CT (CLIP) ×1 IMPLANT
COVER SURGICAL LIGHT HANDLE (MISCELLANEOUS) ×2 IMPLANT
COVER TIP SHEARS 8 DVNC (MISCELLANEOUS) ×1 IMPLANT
COVER TIP SHEARS 8MM DA VINCI (MISCELLANEOUS) ×2
COVER WAND RF STERILE (DRAPES) IMPLANT
CUTTER ECHEON FLEX ENDO 45 340 (ENDOMECHANICALS) IMPLANT
DECANTER SPIKE VIAL GLASS SM (MISCELLANEOUS) ×2 IMPLANT
DERMABOND ADVANCED (GAUZE/BANDAGES/DRESSINGS) ×1
DERMABOND ADVANCED .7 DNX12 (GAUZE/BANDAGES/DRESSINGS) ×1 IMPLANT
DRAIN CHANNEL 15F RND FF 3/16 (WOUND CARE) ×2 IMPLANT
DRAPE ARM DVNC X/XI (DISPOSABLE) ×4 IMPLANT
DRAPE COLUMN DVNC XI (DISPOSABLE) ×1 IMPLANT
DRAPE DA VINCI XI ARM (DISPOSABLE) ×8
DRAPE DA VINCI XI COLUMN (DISPOSABLE) ×2
DRAPE INCISE IOBAN 66X45 STRL (DRAPES) ×2 IMPLANT
DRAPE SHEET LG 3/4 BI-LAMINATE (DRAPES) ×2 IMPLANT
DRSG TEGADERM 4X4.75 (GAUZE/BANDAGES/DRESSINGS) ×1 IMPLANT
ELECT PENCIL ROCKER SW 15FT (MISCELLANEOUS) ×2 IMPLANT
ELECT REM PT RETURN 15FT ADLT (MISCELLANEOUS) ×3 IMPLANT
EVACUATOR SILICONE 100CC (DRAIN) ×2 IMPLANT
GAUZE SPONGE 2X2 8PLY STRL LF (GAUZE/BANDAGES/DRESSINGS) IMPLANT
GLOVE BIO SURGEON STRL SZ 6.5 (GLOVE) ×2 IMPLANT
GLOVE BIOGEL M STRL SZ7.5 (GLOVE) ×4 IMPLANT
GOWN STRL REUS W/TWL LRG LVL3 (GOWN DISPOSABLE) ×5 IMPLANT
GOWN STRL REUS W/TWL XL LVL3 (GOWN DISPOSABLE) ×4 IMPLANT
HEMOSTAT ARISTA ABSORB 3G PWDR (HEMOSTASIS) IMPLANT
IRRIG SUCT STRYKERFLOW 2 WTIP (MISCELLANEOUS) ×2
IRRIGATION SUCT STRKRFLW 2 WTP (MISCELLANEOUS) ×1 IMPLANT
KIT BASIN OR (CUSTOM PROCEDURE TRAY) ×2 IMPLANT
KIT TURNOVER KIT A (KITS) IMPLANT
LOOP VESSEL MAXI BLUE (MISCELLANEOUS) ×1 IMPLANT
MARKER SKIN DUAL TIP RULER LAB (MISCELLANEOUS) ×2 IMPLANT
NDL INSUFFLATION 14GA 120MM (NEEDLE) IMPLANT
NEEDLE INSUFFLATION 14GA 120MM (NEEDLE) IMPLANT
NS IRRIG 1000ML POUR BTL (IV SOLUTION) ×2 IMPLANT
PAD POSITIONING PINK XL (MISCELLANEOUS) ×2 IMPLANT
POUCH SPECIMEN RETRIEVAL 10MM (ENDOMECHANICALS) ×2 IMPLANT
PROTECTOR NERVE ULNAR (MISCELLANEOUS) ×4 IMPLANT
RELOAD STAPLE 45 2.6 WHT THIN (STAPLE) IMPLANT
SEAL CANN UNIV 5-8 DVNC XI (MISCELLANEOUS) ×4 IMPLANT
SEAL XI 5MM-8MM UNIVERSAL (MISCELLANEOUS) ×8
SET TUBE SMOKE EVAC HIGH FLOW (TUBING) ×2 IMPLANT
SOLUTION ELECTROLUBE (MISCELLANEOUS) ×2 IMPLANT
SPONGE GAUZE 2X2 STER 10/PKG (GAUZE/BANDAGES/DRESSINGS) ×1
STAPLE RELOAD 45 WHT (STAPLE) IMPLANT
STAPLE RELOAD 45MM WHITE (STAPLE)
SURGIFLO W/THROMBIN 8M KIT (HEMOSTASIS) ×3 IMPLANT
SUT ETHILON 3 0 PS 1 (SUTURE) ×1 IMPLANT
SUT MNCRL AB 4-0 PS2 18 (SUTURE) ×4 IMPLANT
SUT V-LOC BARB 180 2/0GR6 GS22 (SUTURE) ×4
SUT VIC AB 0 CT1 27 (SUTURE) ×2
SUT VIC AB 0 CT1 27XBRD ANTBC (SUTURE) ×1 IMPLANT
SUT VICRYL 0 UR6 27IN ABS (SUTURE) ×1 IMPLANT
SUT VLOC BARB 180 ABS3/0GR12 (SUTURE) ×4
SUTURE V-LC BRB 180 2/0GR6GS22 (SUTURE) ×1 IMPLANT
SUTURE VLOC BRB 180 ABS3/0GR12 (SUTURE) ×1 IMPLANT
TOWEL OR 17X26 10 PK STRL BLUE (TOWEL DISPOSABLE) ×2 IMPLANT
TOWEL OR NON WOVEN STRL DISP B (DISPOSABLE) ×2 IMPLANT
TRAY FOLEY MTR SLVR 16FR STAT (SET/KITS/TRAYS/PACK) ×2 IMPLANT
TRAY LAPAROSCOPIC (CUSTOM PROCEDURE TRAY) ×2 IMPLANT
TROCAR BLADELESS OPT 5 100 (ENDOMECHANICALS) IMPLANT
TROCAR ENDOPATH XCEL 12X100 BL (ENDOMECHANICALS) IMPLANT
TROCAR XCEL 12X100 BLDLESS (ENDOMECHANICALS) ×2 IMPLANT
WATER STERILE IRR 1000ML POUR (IV SOLUTION) ×2 IMPLANT

## 2020-03-21 NOTE — H&P (Signed)
Patient is a 69 year old African American male seen today for evaluation of left renal mass. Patient has somewhat complex medical history with history of B-cell lymphoma diagnosed in 2016 and currently in remission status post chop therapy. 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 Patient recently had some abdominal discomfort and was seen by his oncologist. MRI of the abdomen was ordered as he has baseline creatinine of 2.8. MRI from 12/29/2019 shows a 3.3 x 2.6 x 2.5 complex cystic mass involving the lower pole of the left kidney suspicious for renal cell carcinoma. Patient had had a CT scan back in January 2020 which was a noncontrast study and on review perhaps there was a hint that the mass was there but was difficult to tell without contrast. Patient also has history of diabetes. Patient denies any flank pain or history of gross hematuria. Here for evaluation management of left renal mass.   Intv: The patient is referred to me for further evaluation and discussion of his cystic left renal mass. Had a follow-up MRI that demonstrated a 3.6 x 3.1 x 2.7 cm cystic exophytic heterogeneous enhancing renal mass. It appears to be abutting the collecting system, but there was no evidence of renal vein invasion. The patient denies any associated symptoms including flank pain or hematuria.   The patient does have an extensive history of lymphoma as well as lung cancer. However he is otherwise in fairly good shape. He is able to easily climb a flight of steps or walk a block. The patient has never had any intra-abdominal surgeries.   The patient does have some chronic renal insufficiency. His most recent creatinine was 2.4.   Interval: Today the patient follows up for discussion the management of the cystic renal mass on the lower pole of his left kidney. He was presented in tumor Board and the consensus  was that this was a primary kidney lesion concerning for renal cell carcinoma and not metastatic disease. The patient had his creatinine read checked at his last office visit here which was 1.8.     ALLERGIES: Ace Inhibitors rituximab    MEDICATIONS: Aspirin 81 mg tablet,chewable  Hydrochlorothiazide 25 mg tablet  Metoprolol Succinate 25 mg tablet, extended release 24 hr  Amlodipine Besylate 10 mg tablet  Glipizide 10 mg tablet  Januvia 50 mg tablet  Lantus 100 unit/ml vial  Losartan Potassium 100 mg tablet  Omega 3  Pravastatin Sodium 40 mg tablet     GU PSH: None   NON-GU PSH: None   GU PMH: Left renal neoplasm - 01/23/2020    NON-GU PMH: Diabetes Type 2 Hypercholesterolemia Hypertension Lymphoma, History    FAMILY HISTORY: 1 Daughter - Runs in Family 1 son - Runs in Family Breast Cancer - Sister stroke - Brother   SOCIAL HISTORY: Marital Status: Widowed Preferred Language: English; Ethnicity: Not Hispanic Or Latino; Race: Black or African American Current Smoking Status: Patient does not smoke anymore. Has not smoked since 01/03/2008. Smoked for 40 years. Smoked 1 pack per day.   Tobacco Use Assessment Completed: Used Tobacco in last 30 days? Does not drink anymore.  Does not drink caffeine.    REVIEW OF SYSTEMS:    GU Review Male:   Patient denies frequent urination, hard to postpone urination, burning/ pain with urination, get up at night to urinate, leakage of urine, stream starts and stops, trouble starting your stream, have to  strain to urinate , erection problems, and penile pain.  Gastrointestinal (Upper):   Patient denies nausea, vomiting, and indigestion/ heartburn.  Gastrointestinal (Lower):   Patient denies diarrhea and constipation.  Constitutional:   Patient denies fever, night sweats, weight loss, and fatigue.  Skin:   Patient denies skin rash/ lesion and itching.  Eyes:   Patient denies blurred vision and double vision.  Ears/ Nose/ Throat:    Patient denies sore throat and sinus problems.  Hematologic/Lymphatic:   Patient denies swollen glands and easy bruising.  Cardiovascular:   Patient denies leg swelling and chest pains.  Respiratory:   Patient denies cough and shortness of breath.  Endocrine:   Patient denies excessive thirst.  Musculoskeletal:   Patient denies back pain and joint pain.  Neurological:   Patient denies headaches and dizziness.  Psychologic:   Patient denies depression and anxiety.   VITAL SIGNS: None  Notes: No vitals taken today   MULTI-SYSTEM PHYSICAL EXAMINATION:    Constitutional: Well-nourished. No physical deformities. Normally developed. Good grooming.  Respiratory: Normal breath sounds. No labored breathing, no use of accessory muscles.   Cardiovascular: Regular rate and rhythm. No murmur, no gallop. Normal temperature, normal extremity pulses, no swelling, no varicosities.   Gastrointestinal: No mass, no tenderness, no rigidity, non obese abdomen.   Musculoskeletal: Normal gait and station of head and neck.     Complexity of Data:  Source Of History:  Patient, Healthcare Provider, Outside Source  Records Review:   Previous Doctor Records, Previous Patient Records, POC Tool  Urine Test Review:   Urinalysis  X-Ray Review: MRI Abdomen: Reviewed Films. Discussed With Patient.     PROCEDURES: None   ASSESSMENT:      ICD-10 Details  1 GU:   Left renal neoplasm - D49.512    PLAN:           Document Letter(s):  Created for Patient: Clinical Summary         Notes:   The patient I along with his family discussed his situation. He has a cystic enhancing renal mass in the left lower pole of his kidney that is endophytic in largely abuts the renal sinus and a branch of lower pole renal vein. He also has fairly significant renal dysfunction. The tumor is certainly concerning for a primary renal cell carcinoma and should be treated based on its size. Ideally we would be able to perform a robotic  assisted partial nephrectomy and preserve the majority of his left kidney. However I do have concerns given the location and its proximity to the renal hilum and the renal vasculature. Fortunately he does have some improvement in his kidney function.   I spoke about risk of partial nephrectomy including the risk of bleeding, a urine leak, or damage to any of the surrounding neurovascular structures. We also discussed the damage to the surrounding areas. I also spent a significant amount of time discussing the possibility requiring a nephrectomy because I was un able to safely remove the tumor without significant risk to the patient. The patient understands all of this and is willing to proceed. Will try to get this scheduled for the patient in the near future.

## 2020-03-21 NOTE — Anesthesia Procedure Notes (Signed)
Procedure Name: Intubation Date/Time: 03/21/2020 7:41 AM Performed by: Raenette Rover, CRNA Pre-anesthesia Checklist: Patient identified, Emergency Drugs available, Suction available and Patient being monitored Patient Re-evaluated:Patient Re-evaluated prior to induction Oxygen Delivery Method: Circle system utilized Preoxygenation: Pre-oxygenation with 100% oxygen Induction Type: IV induction Ventilation: Mask ventilation without difficulty Laryngoscope Size: Mac and 4 Grade View: Grade I Tube type: Oral Tube size: 7.5 mm Number of attempts: 1 Airway Equipment and Method: Stylet Placement Confirmation: ETT inserted through vocal cords under direct vision,  positive ETCO2 and breath sounds checked- equal and bilateral Secured at: 22 cm Tube secured with: Tape Dental Injury: Teeth and Oropharynx as per pre-operative assessment

## 2020-03-21 NOTE — Interval H&P Note (Signed)
History and Physical Interval Note:  03/21/2020 7:27 AM  [name] [name]  has presented today for surgery, with the diagnosis of LEFT LOWER POLE RENAL MASS.  The various methods of treatment have been discussed with the patient and family. After consideration of risks, benefits and other options for treatment, the patient has consented to  Procedure(s): XI ROBOTIC ASSITED LEFT PARTIAL NEPHRECTOMY (Left) as a surgical intervention.  The patient's history has been reviewed, patient examined, no change in status, stable for surgery.  I have reviewed the patient's chart and labs.  Questions were answered to the patient's satisfaction.     Ardis Hughs

## 2020-03-21 NOTE — Transfer of Care (Signed)
Immediate Anesthesia Transfer of Care Note  Patient: [name] [name]  Procedure(s) Performed: XI ROBOTIC ASSITED LEFT PARTIAL NEPHRECTOMY (Left )  Patient Location: PACU  Anesthesia Type:General  Level of Consciousness: drowsy  Airway & Oxygen Therapy: Patient Spontanous Breathing and Patient connected to face mask oxygen  Post-op Assessment: Report given to RN and Post -op Vital signs reviewed and stable  Post vital signs: Reviewed and stable  Last Vitals:  Vitals Value Taken Time  BP 144/65 03/21/20 1200  Temp    Pulse 65 03/21/20 1202  Resp 16 03/21/20 1202  SpO2 100 % 03/21/20 1202  Vitals shown include unvalidated device data.  Last Pain:  Vitals:   03/21/20 0604  TempSrc: Oral         Complications: No complications documented.

## 2020-03-21 NOTE — Op Note (Signed)
Preoperative diagnosis:  1. Left renal mass   Postoperative diagnosis:  1. same   Procedure: 1. Robotic assisted laparoscopic left partial nephrectomy  Surgeon: Ardis Hughs, MD 1st assistant: Debbrah Alar, PA-C Resident: Celene Squibb, MD  Anesthesia: General  Complications: None  Intraoperative findings:  #1. Left endophytic lower pole mass - large resection  - left hemi-nephrectomy into collecting system #2. Warm ischemia time 37 min  EBL: 11mL  Specimens: left renal mass   Indication: [name] [name] is a 69 y.o. patient with left renal mass.  After reviewing the management options for treatment, he elected to proceed with the above surgical procedure(s). We have discussed the potential benefits and risks of the procedure, side effects of the proposed treatment, the likelihood of the patient achieving the goals of the procedure, and any potential problems that might occur during the procedure or recuperation. Informed consent has been obtained.  Description of procedure:  The patient was taken to the operating room and a general anesthetic was administered. The patient was given preoperative antibiotics, placed in the right modified flank position with care to pad all potential pressure points, and prepped and draped in the usual sterile fashion. Next a preoperative timeout was performed.  A site was selected on the left side of the umbilicus for placement of the camera port. This was placed using a standard modified Hassan technique with entry into the peritoneum with a  8 mm tocar. We entered the peritoneum without incident and established pneumoperitoneum. The camera was then used to inspect the abdomen and there was no evidence of any intra-abdominal injuries or other abnormalities. The remaining abdominal ports were then placed. 8 mm robotic ports were placed in the left upper quadrant, left lower quadrant, and left lateral abdominal wall, making a soft J. A 12 mm  port was placed in the upper midline for laparoscopic assistance. All ports were placed under direct vision without difficulty. The surgical cart was then docked.   Utilizing the cautery scissors, the white line of Toldt was incised allowing the colon to be mobilized medially and the plane between the mesocolon and the anterior layer of Gerota's fascia to be developed and the kidney to be exposed. The ureter and gonadal vein were identified inferiorly and the ureter was lifted anteriorly off the psoas muscle. Dissection proceeded superiorly along the gonadal vein until the renal vein was identified. The renal hilum was then carefully isolated with a combination of blunt and sharp dissection allowing the renal arterial and venous structures to be separated and isolated in preparation for renal hilar vessel clamping.  Attention turned to the kidney and the perinephric fat surrounding the renal mass was removed and the kidney was mobilized sufficiently for exposure and resection of the renal mass.   Once the renal mass was properly isolated, preparations were made for resection of the tumor. The renal artery was then clamped with a bulldog clamp. The tumor was then excised with cold scissor dissection along with an adequate visible gross margin of normal renal parenchyma.  Several large branches from the hilum were secured with medium sized clamps.  The tumor appeared to be excised without any gross violation of the tumor. The renal collecting system was entered during removal of the tumor.Two unning 3-0 V-lock suture was then brought through the capsule of the kidney and run along the base of the renal defect to provide hemostasis and close any entry into the renal collecting system if present.  Three calyx were  closed. Weck clips were used to secure this suture outside the renal capsule at the proximal and distal ends. The bulldog clamps were then removed from the renal hilar vessel. A running 2-0 V  lock suture was then used to close the capsule of the kidney using a sliding clip technique which resulted in excellent hemostasis. An additional hemostatic agent (Surgiflo) was then placed into the renal defect.   Total warm renal ischemia time was 37 minutes. The renal tumor resection site was examined. Hemostasis appeared adequate.   The kidney was placed back into its normal anatomic position and covered with perinephric fat as needed. A # 72 Blake drain was then brought through the lateral lower port site and positioned in the perinephric space. It was secured to the skin with a nylon suture. The surgical robotic cart was undocked. The renal tumor specimen was removed intact within an endopouch retrieval bag via the camera port sites. The camera port site and the other 12 mm port site were then closed at the fascial level with 0-vicryl suture. All other laparoscopic/robotic ports were removed under direct vision and the pneumoperitoneum let down with inspection of the operative field performed and hemostasis again confirmed. All incision sites were then injected with local anesthetic and reapproximated at the skin level with 4-0 monocryl subcuticular closures. Dermabond was applied to the skin. The patient tolerated the procedure well and without complications. The patient was able to be extubated and transferred to the recovery unit in satisfactory condition.  Ardis Hughs, M.D.

## 2020-03-21 NOTE — Anesthesia Postprocedure Evaluation (Signed)
Anesthesia Post Note  Patient: JAMES SENN  Procedure(s) Performed: XI ROBOTIC ASSITED LEFT PARTIAL NEPHRECTOMY (Left )     Patient location during evaluation: PACU Anesthesia Type: General Level of consciousness: awake and alert Pain management: pain level controlled Vital Signs Assessment: post-procedure vital signs reviewed and stable Respiratory status: spontaneous breathing, nonlabored ventilation, respiratory function stable and patient connected to nasal cannula oxygen Cardiovascular status: blood pressure returned to baseline and stable Postop Assessment: no apparent nausea or vomiting Anesthetic complications: no   No complications documented.  Last Vitals:  Vitals:   03/21/20 1215 03/21/20 1230  BP: (!) 152/70 140/70  Pulse: 60 62  Resp: 14 18  Temp: (!) 35.7 C (!) 35.9 C  SpO2: 100% 93%    Last Pain:  Vitals:   03/21/20 1230  TempSrc:   PainSc: 0-No pain                 Mailynn Everly S

## 2020-03-21 NOTE — Discharge Instructions (Signed)

## 2020-03-22 ENCOUNTER — Encounter (HOSPITAL_COMMUNITY): Payer: Self-pay | Admitting: Urology

## 2020-03-22 DIAGNOSIS — C642 Malignant neoplasm of left kidney, except renal pelvis: Secondary | ICD-10-CM | POA: Diagnosis not present

## 2020-03-22 LAB — BASIC METABOLIC PANEL
Anion gap: 9 (ref 5–15)
BUN: 24 mg/dL — ABNORMAL HIGH (ref 8–23)
CO2: 24 mmol/L (ref 22–32)
Calcium: 8.4 mg/dL — ABNORMAL LOW (ref 8.9–10.3)
Chloride: 103 mmol/L (ref 98–111)
Creatinine, Ser: 2.34 mg/dL — ABNORMAL HIGH (ref 0.61–1.24)
GFR calc Af Amer: 31 mL/min — ABNORMAL LOW (ref >60)
GFR calc non Af Amer: 27 mL/min — ABNORMAL LOW (ref >60)
Glucose, Bld: 117 mg/dL — ABNORMAL HIGH (ref 70–99)
Potassium: 4.1 mmol/L (ref 3.5–5.1)
Sodium: 136 mmol/L (ref 135–145)

## 2020-03-22 LAB — GLUCOSE, CAPILLARY
Glucose-Capillary: 98 mg/dL (ref 70–99)
Glucose-Capillary: 125 mg/dL — ABNORMAL HIGH (ref 70–99)
Glucose-Capillary: 133 mg/dL — ABNORMAL HIGH (ref 70–99)
Glucose-Capillary: 176 mg/dL — ABNORMAL HIGH (ref 70–99)

## 2020-03-22 LAB — HEMOGLOBIN AND HEMATOCRIT, BLOOD
HCT: 39.9 % (ref 39.0–52.0)
Hemoglobin: 13.4 g/dL (ref 13.0–17.0)

## 2020-03-22 NOTE — Progress Notes (Signed)
1 Day Post-Op   Subjective/Chief Complaint:  1 - LEFT Renal Mass - s/p LEFT robotic partial nephrectomy 03/21/20 by Louis Meckel. Path pending.   2 - Stage 3b Renal Insufficiency - Cr 2s GFR low 30s at baseline.  Today "[name] [name]" is stable. Hgb 13, Cr 2.3. JP output acceptable.    Objective: Vital signs in last 24 hours: Temp:  [96 F (35.6 C)-98.8 F (37.1 C)] 98.6 F (37 C) (09/18 0513) Pulse Rate:  [60-76] 61 (09/18 0845) Resp:  [12-18] 16 (09/18 0513) BP: (131-152)/(62-83) 141/70 (09/18 0845) SpO2:  [93 %-100 %] 98 % (09/18 0513) Weight:  [81.7 kg] 81.7 kg (09/17 1422) Last BM Date: 03/20/20  Intake/Output from previous day: 09/17 0701 - 09/18 0700 In: 4795.5 [P.O.:600; I.V.:3495.2; IV Piggyback:700.3] Out: 2512 [Urine:2257; Drains:205; Blood:50] Intake/Output this shift: Total I/O In: 360 [P.O.:360] Out: 20 [Drains:20]  NAD, AOx3, very pleasant NCAT Non-labored breathign on RA Reg HR SNTND, recent surgical sites c/d/i.  JP output scant non-foul serosanguinous. No c/c/e  Lab Results:  Recent Labs    03/21/20 1208 03/22/20 0545  HGB 13.7 13.4  HCT 40.8 39.9   BMET Recent Labs    03/22/20 0545  NA 136  K 4.1  CL 103  CO2 24  GLUCOSE 117*  BUN 24*  CREATININE 2.34*  CALCIUM 8.4*   PT/INR No results for input(s): LABPROT, INR in the last 72 hours. ABG No results for input(s): PHART, HCO3 in the last 72 hours.  Invalid input(s): PCO2, PO2  Studies/Results: No results found.  Anti-infectives: Anti-infectives (From admission, onward)   Start     Dose/Rate Route Frequency Ordered Stop   03/21/20 0545  ceFAZolin (ANCEF) IVPB 2g/100 mL premix        2 g 200 mL/hr over 30 Minutes Intravenous 30 min pre-op 03/21/20 0528 03/21/20 0743      Assessment/Plan:  1 - LEFT Renal Mass - doing well POD 1. DC foley, CC diet. Saline Lock, ambulate. Check BMp, H/H AM tomorrow and likely DC home tomorrow based on current progress.   2 - Stage 3b Renal  Insufficiency - GFR acceptable.   Alexis Frock 03/22/2020

## 2020-03-22 NOTE — Plan of Care (Signed)
  Problem: Education: Goal: Knowledge of General Education information will improve Description: Including pain rating scale, medication(s)/side effects and non-pharmacologic comfort measures Outcome: Progressing   Problem: Health Behavior/Discharge Planning: Goal: Ability to manage health-related needs will improve Outcome: Progressing   Problem: Clinical Measurements: Goal: Ability to maintain clinical measurements within normal limits will improve Outcome: Progressing Goal: Will remain free from infection Outcome: Progressing Goal: Diagnostic test results will improve Outcome: Progressing Goal: Respiratory complications will improve Outcome: Progressing Goal: Cardiovascular complication will be avoided Outcome: Progressing   Problem: Activity: Goal: Risk for activity intolerance will decrease Outcome: Progressing   Problem: Nutrition: Goal: Adequate nutrition will be maintained Outcome: Progressing   Problem: Coping: Goal: Level of anxiety will decrease Outcome: Progressing   Problem: Elimination: Goal: Will not experience complications related to bowel motility Outcome: Progressing Goal: Will not experience complications related to urinary retention Outcome: Progressing   Problem: Pain Managment: Goal: General experience of comfort will improve Outcome: Progressing   Problem: Safety: Goal: Ability to remain free from injury will improve Outcome: Progressing   Problem: Skin Integrity: Goal: Risk for impaired skin integrity will decrease Outcome: Progressing   Problem: Education: Goal: Knowledge of the prescribed therapeutic regimen will improve Outcome: Progressing   Problem: Bowel/Gastric: Goal: Gastrointestinal status for postoperative course will improve Outcome: Progressing   Problem: Clinical Measurements: Goal: Postoperative complications will be avoided or minimized Outcome: Progressing   Problem: Respiratory: Goal: Ability to achieve and  maintain a regular respiratory rate will improve Outcome: Progressing   Problem: Skin Integrity: Goal: Demonstration of wound healing without infection will improve Outcome: Progressing   Problem: Urinary Elimination: Goal: Ability to avoid or minimize complications of infection will improve Outcome: Progressing Goal: Ability to achieve and maintain urine output will improve Outcome: Progressing   

## 2020-03-23 DIAGNOSIS — C642 Malignant neoplasm of left kidney, except renal pelvis: Secondary | ICD-10-CM | POA: Diagnosis not present

## 2020-03-23 LAB — BASIC METABOLIC PANEL
Anion gap: 12 (ref 5–15)
BUN: 29 mg/dL — ABNORMAL HIGH (ref 8–23)
CO2: 20 mmol/L — ABNORMAL LOW (ref 22–32)
Calcium: 8.4 mg/dL — ABNORMAL LOW (ref 8.9–10.3)
Chloride: 102 mmol/L (ref 98–111)
Creatinine, Ser: 2.61 mg/dL — ABNORMAL HIGH (ref 0.61–1.24)
GFR calc Af Amer: 27 mL/min — ABNORMAL LOW (ref >60)
GFR calc non Af Amer: 24 mL/min — ABNORMAL LOW (ref >60)
Glucose, Bld: 140 mg/dL — ABNORMAL HIGH (ref 70–99)
Potassium: 3.8 mmol/L (ref 3.5–5.1)
Sodium: 134 mmol/L — ABNORMAL LOW (ref 135–145)

## 2020-03-23 LAB — GLUCOSE, CAPILLARY
Glucose-Capillary: 145 mg/dL — ABNORMAL HIGH (ref 70–99)
Glucose-Capillary: 155 mg/dL — ABNORMAL HIGH (ref 70–99)

## 2020-03-23 LAB — HEMOGLOBIN AND HEMATOCRIT, BLOOD
HCT: 40.9 % (ref 39.0–52.0)
Hemoglobin: 14 g/dL (ref 13.0–17.0)

## 2020-03-23 NOTE — Progress Notes (Signed)
Patient discharged home with family, discharge instructions given and explained to patient, he verbalized understanding, patient denies any pain/distress, Surgical incision clean/intact, no signs of infection noted. No pressure injury noted. accompanied home by [facility]

## 2020-03-23 NOTE — Discharge Summary (Signed)
Physician Discharge Summary  [name] MRN: [id number] DOB/AGE: 1950-10-01 69 y.o.  Admit date: 03/21/2020 Discharge date: 03/23/2020  Admission Diagnoses: Left Renal Mass  Discharge Diagnoses:  Active Problems:   Renal mass   Discharged Condition: good  Hospital Course: Pt underwent LEFT robotic partial nephrectomy 03/21/20. He has baseline renal insuficiency with Cr about 2.0. POD 1 foley removed and JP output remained scant. By the AM of POD 2, the day of discharge, he is ambulatory, pain controlled on PO meds, maintaining PO nutrition, JP removed as output minimal, and felt to be adequtae for discharge. Hgb 14, Cr 2.6, path pending.   Consults: None  Significant Diagnostic Studies: labs: as per above  Treatments: surgery: as per above  Discharge Exam: Blood pressure (!) 142/67, pulse 81, temperature 98.1 F (36.7 C), temperature source Oral, resp. rate (!) 22, height 5\' 10"  (1.017 m), weight 81.7 kg, SpO2 93 %.  NAD, AOx3, very pleasant NCAT Non-labored breathign on RA Reg HR SNTND, recent surgical sites c/d/i.  JP output scant non-foul serosanguinous, removed and dry dressing applied No c/c/e  Disposition: HOME   Allergies as of 03/23/2020      Reactions   Ace Inhibitors Hives   Rituximab Other (See Comments)   This medication caused him to sweat & shake. Patient is able to tolerate non-RIR rituximab       Medication List    STOP taking these medications   aspirin 81 MG tablet   fish oil-omega-3 fatty acids 1000 MG capsule     TAKE these medications   accu-chek soft touch lancets Use as instructed   amLODipine 10 MG tablet Commonly known as: NORVASC TAKE ONE TABLET BY MOUTH ONCE DAILY What changed:   how much to take  how to take this  when to take this  additional instructions   famotidine 20 MG tablet Commonly known as: PEPCID Take 20 mg by mouth daily as needed for heartburn or indigestion.   glipiZIDE 10 MG 24 hr  tablet Commonly known as: GLUCOTROL XL TAKE ONE TABLET BY MOUTH ONCE DAILY What changed:   how much to take  how to take this  when to take this  additional instructions   glucose blood test strip Commonly known as: Glucose Meter Test Use as instructed   hydrochlorothiazide 25 MG tablet Commonly known as: HYDRODIURIL Take 1 tablet (25 mg total) by mouth daily.   insulin glargine 100 UNIT/ML Solostar Pen Commonly known as: LANTUS Inject 10 Units into the skin daily at 10 pm.   Insulin Pen Needle 32G X 4 MM Misc Use to check home glucose as directed   losartan 100 MG tablet Commonly known as: COZAAR Take 1 tablet (100 mg total) by mouth daily.   metoprolol tartrate 25 MG tablet Commonly known as: LOPRESSOR Take 1 tablet (25 mg total) by mouth 2 (two) times daily.   pravastatin 40 MG tablet Commonly known as: PRAVACHOL TAKE ONE TABLET BY MOUTH ONCE DAILY What changed:   how much to take  how to take this  when to take this  additional instructions   sitaGLIPtin 50 MG tablet Commonly known as: JANUVIA Take 1 tablet (50 mg total) by mouth daily.   traMADol 50 MG tablet Commonly known as: Ultram Take 1-2 tablets (50-100 mg total) by mouth every 6 (six) hours as needed for moderate pain or severe pain.       Follow-up Information    Karen Kays, NP On 04/04/2020.  Specialty: Nurse Practitioner Why: at 11:00 Contact information: [address] [place] [address] [phone]               Signed: Alexis Frock 03/23/2020, 7:56 AM

## 2020-03-25 LAB — SURGICAL PATHOLOGY

## 2020-03-26 ENCOUNTER — Encounter: Payer: Self-pay | Admitting: Hematology and Oncology

## 2020-03-26 ENCOUNTER — Inpatient Hospital Stay: Payer: Medicare Other | Attending: Hematology and Oncology | Admitting: Hematology and Oncology

## 2020-03-26 ENCOUNTER — Other Ambulatory Visit: Payer: Self-pay

## 2020-03-26 DIAGNOSIS — Z8572 Personal history of non-Hodgkin lymphomas: Secondary | ICD-10-CM | POA: Diagnosis present

## 2020-03-26 DIAGNOSIS — Z905 Acquired absence of kidney: Secondary | ICD-10-CM | POA: Insufficient documentation

## 2020-03-26 DIAGNOSIS — C8338 Diffuse large B-cell lymphoma, lymph nodes of multiple sites: Secondary | ICD-10-CM

## 2020-03-26 DIAGNOSIS — Z79899 Other long term (current) drug therapy: Secondary | ICD-10-CM | POA: Insufficient documentation

## 2020-03-26 DIAGNOSIS — C3492 Malignant neoplasm of unspecified part of left bronchus or lung: Secondary | ICD-10-CM | POA: Diagnosis not present

## 2020-03-26 DIAGNOSIS — Z85118 Personal history of other malignant neoplasm of bronchus and lung: Secondary | ICD-10-CM | POA: Insufficient documentation

## 2020-03-26 DIAGNOSIS — Z9221 Personal history of antineoplastic chemotherapy: Secondary | ICD-10-CM | POA: Diagnosis not present

## 2020-03-26 DIAGNOSIS — Z85528 Personal history of other malignant neoplasm of kidney: Secondary | ICD-10-CM | POA: Insufficient documentation

## 2020-03-26 NOTE — Assessment & Plan Note (Signed)
I have reviewed final pathology with the patient and his daughter He has clear margins of resection, early stage I disease He is considered cured and does not need adjuvant treatment I would defer to urologist for further follow-up

## 2020-03-26 NOTE — Assessment & Plan Note (Signed)
His last CT imaging was in January 2020 I plan to repeat CT imaging when I see him next year He is not symptomatic

## 2020-03-26 NOTE — Progress Notes (Signed)
[place] OFFICE PROGRESS NOTE  Patient Care Team: Harrison Mons, [place] as PCP - General (Family Medicine) Corliss Parish, MD as Consulting Physician (Nephrology) Fanny Skates, MD as Consulting Physician (General Surgery) Heath Lark, MD as Consulting Physician (Hematology and Oncology) Javier Glazier, MD as Consulting Physician (Pulmonary Disease) Juanita Craver, MD as Consulting Physician (Gastroenterology)  ASSESSMENT & PLAN:  History of kidney cancer I have reviewed final pathology with the patient and his daughter He has clear margins of resection, early stage I disease He is considered cured and does not need adjuvant treatment I would defer to urologist for further follow-up  Adenocarcinoma of lung, stage 1, left [facility]) His last CT imaging was in January 2020 I plan to repeat CT imaging when I see him next year He is not symptomatic  Diffuse large B-cell lymphoma of lymph nodes of multiple regions [facility]) He has no evidence of lymphoma recurrence I will see him again in January as scheduled    Orders Placed This Encounter  Procedures  . CT CHEST WO CONTRAST    Standing Status:   Future    Standing Expiration Date:   03/26/2021    Order Specific Question:   Preferred imaging location?    Answer:   [facility]    Order Specific Question:   Radiology Contrast Protocol - do NOT remove file path    Answer:   [email or web address]    All questions were answered. The patient knows to call the clinic with any problems, questions or concerns. The total time spent in the appointment was 20 minutes encounter with patients including review of chart and various tests results, discussions about plan of care and coordination of care plan   Heath Lark, MD 03/26/2020 1:30 PM  INTERVAL HISTORY: Please see below for problem oriented charting. He returns with his daughter for further follow-up and review of plan of  care He recovered well from recent partial nephrectomy No complications from surgery He denies pain Appetite is fair Denies recent cough, chest pain or shortness of breath. SUMMARY OF ONCOLOGIC HISTORY: Oncology History Overview Note  Lymphoma   Staging form: Lymphoid Neoplasms, AJCC 6th Edition     Clinical stage from 11/21/2014: Stage IV - Signed by Heath Lark, MD on 11/21/2014     Diffuse large B-cell lymphoma of lymph nodes of multiple regions ([place])  11/13/2014 Imaging   PEt scan showed widespread disease including possible splenic involvement   11/19/2014 Surgery   He has excisional LN biopsy and port placement   11/19/2014 Pathology Results   Biopsy of lymph nodes showed DLBCL   11/29/2014 - 01/31/2015 Chemotherapy   He received 4 cycles of RCHOP chemotherapy complicated by severe infusion reaction.   12/19/2014 - 01/09/2015 Chemotherapy   He received 2 cycles of prophylactic IT chemotherapy.   01/31/2015 Adverse Reaction   I reduce the dose of his chemotherapy, prednisone and dexamethasone due to poorly controlled diabetes and hyperglycemia   02/12/2015 Imaging    repeat PET CT scan show complete response to chemotherapy but new lung infiltrates   09/09/2015 Imaging   PET CT showed complete response except for new inflammatory change in the lung   03/01/2016 Imaging   CT chest showed mild interval growth of subsolid left upper lobe pulmonary nodule, which was hypermetabolic on 81/27/5170 PET-CT. A primary bronchogenic adenocarcinoma cannot be excluded. Thoracic surgical consultation is advised. No lymphadenopathy or other findings of metastatic disease in the chest. Moderate emphysema with mild diffuse bronchial  wall thickening, suggesting COPD. Aortic atherosclerosis. Stable small pericardial effusion/thickening.   08/30/2016 PET scan   No evidence of hypermetabolic residual or recurrent lymphoma. 2. Interval left upper lobe wedge resection for adenocarcinoma. Healing left rib  fractures which are likely postsurgical. 3. Aortic atherosclerosis.   02/25/2017 PET scan   1. No abnormal nodal enlargement or hypermetabolic activity to suggest recurrent lymphoma. Postoperative findings in the left chest without findings of recurrent left lung cancer. 2. Aortic Atherosclerosis (ICD10-I70.0) and Emphysema (ICD10-J43.9).   12/31/2019 Imaging   1. No noncontrast MR abnormality of the abdomen to explain abdominal swelling or bloating.    2. There is a mixed solid and cystic mass of the inferior pole of the left kidney measuring approximately 3.3 x 2.6 x 2.5 cm. This was possibly present on prior CT dated 07/13/2018 although very difficult to appreciate given lack of IV contrast. Findings are highly suspicious for renal cell carcinoma; assessment is generally limited by lack of intravenous contrast.   Adenocarcinoma of lung, stage 1, left ([place])  03/24/2016 Procedure   He underwent electromagnetic navigational bronchoscopy with needle aspirations, brushings, and biopsies.   03/24/2016 Pathology Results   Accession: [id number] biopsy was positive for adenocarcinoma   04/15/2016 Initial Diagnosis   Adenocarcinoma of lung, stage 1, left ([place])   04/15/2016 Surgery   He underwent left video-assisted thoracoscopy, thoracoscopic left upper lobectomy, mediastinal lymph node dissection, on-Q local anesthetic catheter placement   04/15/2016 Pathology Results   Accession: [id number] surgical specimen revealed adenocarcinoma, 2.2 cm with negative margin and negative LN involvement   07/08/2017 Imaging   1. No evidence of local tumor recurrence in the left hemithorax status post left upper lobectomy. 2. No evidence of recurrent lymphoma. No adenopathy. Normal size spleen. 3. Solitary 5 mm solid pulmonary nodule in the superior segment right lower lobe, for which 16 month stability has been demonstrated, probably benign. 4. Subcentimeter low-attenuation lateral segment left liver lobe  lesion, for which 16 month stability has been demonstrated, probably benign. 5. No evidence of bowel obstruction or acute bowel inflammation. 6. Chronic findings include: Aortic Atherosclerosis (ICD10-I70.0) and Emphysema (ICD10-J43.9). Mild-to-moderate prostatomegaly. Small fat containing left inguinal hernia.   07/13/2018 Imaging   1. Status post left upper lobectomy, without recurrent or metastatic disease. 2. Right axillary node dissection, without evidence of active lymphoma. 3. Aortic atherosclerosis (ICD10-I70.0) and emphysema (ICD10-J43.9). 4. Right lower lobe pulmonary nodule is similar and can be presumed benign.   06/25/2019 Imaging   CXR 1. Postsurgical changes from prior left upper lobectomy. 2. Right basilar atelectatic changes. 3. No acute cardiopulmonary abnormality. 4. Mild gaseous distention of the stomach, nonspecific.     REVIEW OF SYSTEMS:   Constitutional: Denies fevers, chills or abnormal weight loss Eyes: Denies blurriness of vision Ears, nose, mouth, throat, and face: Denies mucositis or sore throat Respiratory: Denies cough, dyspnea or wheezes Cardiovascular: Denies palpitation, chest discomfort or lower extremity swelling Gastrointestinal:  Denies nausea, heartburn or change in bowel habits Skin: Denies abnormal skin rashes Lymphatics: Denies new lymphadenopathy or easy bruising Neurological:Denies numbness, tingling or new weaknesses Behavioral/Psych: Mood is stable, no new changes  All other systems were reviewed with the patient and are negative.  I have reviewed the past medical history, past surgical history, social history and family history with the patient and they are unchanged from previous note.  ALLERGIES:  is allergic to ace inhibitors and rituximab.  MEDICATIONS:  Current Outpatient Medications  Medication Sig Dispense Refill  . amLODipine (NORVASC) 10  MG tablet TAKE ONE TABLET BY MOUTH ONCE DAILY (Patient taking differently: Take 10 mg by  mouth daily. ) 90 tablet 3  . famotidine (PEPCID) 20 MG tablet Take 20 mg by mouth daily as needed for heartburn or indigestion.     Marland Kitchen glipiZIDE (GLUCOTROL XL) 10 MG 24 hr tablet TAKE ONE TABLET BY MOUTH ONCE DAILY (Patient taking differently: Take 10 mg by mouth daily. ) 90 tablet 3  . glucose blood (GLUCOSE METER TEST) test strip Use as instructed 100 each 3  . hydrochlorothiazide (HYDRODIURIL) 25 MG tablet Take 1 tablet (25 mg total) by mouth daily. 90 tablet 3  . Insulin Glargine (LANTUS) 100 UNIT/ML Solostar Pen Inject 10 Units into the skin daily at 10 pm. 15 mL 11  . Insulin Pen Needle 32G X 4 MM MISC Use to check home glucose as directed 100 each 3  . Lancets (ACCU-CHEK SOFT TOUCH) lancets Use as instructed 100 each 3  . losartan (COZAAR) 100 MG tablet Take 1 tablet (100 mg total) by mouth daily. 90 tablet 3  . metoprolol tartrate (LOPRESSOR) 25 MG tablet Take 1 tablet (25 mg total) by mouth 2 (two) times daily. 180 tablet 3  . pravastatin (PRAVACHOL) 40 MG tablet TAKE ONE TABLET BY MOUTH ONCE DAILY (Patient taking differently: Take 40 mg by mouth daily. ) 90 tablet 3  . sitaGLIPtin (JANUVIA) 50 MG tablet Take 1 tablet (50 mg total) by mouth daily. 30 tablet 12  . traMADol (ULTRAM) 50 MG tablet Take 1-2 tablets (50-100 mg total) by mouth every 6 (six) hours as needed for moderate pain or severe pain. 20 tablet 0   No current facility-administered medications for this visit.    PHYSICAL EXAMINATION: ECOG PERFORMANCE STATUS: 1 - Symptomatic but completely ambulatory  Vitals:   03/26/20 1003  BP: (!) 151/73  Pulse: 75  Resp: 18  Temp: 98.9 F (37.2 C)  SpO2: 100%   Filed Weights   03/26/20 1003  Weight: 166 lb 6.4 oz (75.5 kg)    GENERAL:alert, no distress and comfortable  NEURO: alert & oriented x 3 with fluent speech, no focal motor/sensory deficits  LABORATORY DATA:  I have reviewed the data as listed    Component Value Date/Time   NA 134 (L) 03/23/2020 0609   NA  142 11/02/2017 0833   NA 141 06/17/2017 0938   K 3.8 03/23/2020 0609   K 4.1 06/17/2017 0938   CL 102 03/23/2020 0609   CO2 20 (L) 03/23/2020 0609   CO2 25 06/17/2017 0938   GLUCOSE 140 (H) 03/23/2020 0609   GLUCOSE 144 (H) 06/17/2017 0938   BUN 29 (H) 03/23/2020 0609   BUN 31 (H) 11/02/2017 0833   BUN 30.5 (H) 06/17/2017 0938   CREATININE 2.61 (H) 03/23/2020 0609   CREATININE 2.2 (H) 06/17/2017 0938   CALCIUM 8.4 (L) 03/23/2020 0609   CALCIUM 9.3 06/17/2017 0938   PROT 6.8 03/13/2020 0822   PROT 6.4 11/02/2017 0833   PROT 7.1 06/17/2017 0938   ALBUMIN 4.0 03/13/2020 0822   ALBUMIN 4.2 11/02/2017 0833   ALBUMIN 4.1 06/17/2017 0938   AST 25 03/13/2020 0822   AST 25 06/17/2017 0938   ALT 22 03/13/2020 0822   ALT 31 06/17/2017 0938   ALKPHOS 56 03/13/2020 0822   ALKPHOS 76 06/17/2017 0938   BILITOT 0.8 03/13/2020 0822   BILITOT 0.4 11/02/2017 0833   BILITOT 0.68 06/17/2017 0938   GFRNONAA 24 (L) 03/23/2020 0609   GFRNONAA 33 (L)  09/11/2013 0920   GFRAA 27 (L) 03/23/2020 0609   GFRAA 38 (L) 09/11/2013 0920    No results found for: SPEP, UPEP  Lab Results  Component Value Date   WBC 10.3 03/13/2020   NEUTROABS 10.4 (H) 07/19/2019   HGB 14.0 03/23/2020   HCT 40.9 03/23/2020   MCV 85.9 03/13/2020   PLT 202 03/13/2020      Chemistry      Component Value Date/Time   NA 134 (L) 03/23/2020 0609   NA 142 11/02/2017 0833   NA 141 06/17/2017 0938   K 3.8 03/23/2020 0609   K 4.1 06/17/2017 0938   CL 102 03/23/2020 0609   CO2 20 (L) 03/23/2020 0609   CO2 25 06/17/2017 0938   BUN 29 (H) 03/23/2020 0609   BUN 31 (H) 11/02/2017 0833   BUN 30.5 (H) 06/17/2017 0938   CREATININE 2.61 (H) 03/23/2020 0609   CREATININE 2.2 (H) 06/17/2017 0938   GLU 382 (H) 01/31/2015 1551      Component Value Date/Time   CALCIUM 8.4 (L) 03/23/2020 0609   CALCIUM 9.3 06/17/2017 0938   ALKPHOS 56 03/13/2020 0822   ALKPHOS 76 06/17/2017 0938   AST 25 03/13/2020 0822   AST 25 06/17/2017  0938   ALT 22 03/13/2020 0822   ALT 31 06/17/2017 0938   BILITOT 0.8 03/13/2020 0822   BILITOT 0.4 11/02/2017 0833   BILITOT 0.68 06/17/2017 7893

## 2020-03-26 NOTE — Assessment & Plan Note (Signed)
He has no evidence of lymphoma recurrence I will see him again in January as scheduled

## 2020-07-16 ENCOUNTER — Encounter (HOSPITAL_COMMUNITY): Payer: Self-pay

## 2020-07-16 ENCOUNTER — Ambulatory Visit (HOSPITAL_COMMUNITY)
Admission: RE | Admit: 2020-07-16 | Discharge: 2020-07-16 | Disposition: A | Discharge status: Home / Self Care | Payer: Medicare Other | Source: Ambulatory Visit | Attending: Hematology and Oncology | Admitting: Hematology and Oncology

## 2020-07-16 ENCOUNTER — Other Ambulatory Visit: Payer: Self-pay

## 2020-07-16 DIAGNOSIS — C3492 Malignant neoplasm of unspecified part of left bronchus or lung: Secondary | ICD-10-CM | POA: Diagnosis present

## 2020-07-16 HISTORY — DX: Malignant neoplasm of left kidney, except renal pelvis: C64.2

## 2020-07-16 HISTORY — DX: Malignant neoplasm of unspecified part of unspecified bronchus or lung: C34.90

## 2020-07-17 ENCOUNTER — Inpatient Hospital Stay: Payer: Medicare Other

## 2020-07-17 ENCOUNTER — Other Ambulatory Visit: Payer: Self-pay

## 2020-07-17 ENCOUNTER — Encounter: Payer: Self-pay | Admitting: Hematology and Oncology

## 2020-07-17 ENCOUNTER — Inpatient Hospital Stay: Payer: Medicare Other | Attending: Hematology and Oncology | Admitting: Hematology and Oncology

## 2020-07-17 DIAGNOSIS — C3492 Malignant neoplasm of unspecified part of left bronchus or lung: Secondary | ICD-10-CM

## 2020-07-17 DIAGNOSIS — Z85528 Personal history of other malignant neoplasm of kidney: Secondary | ICD-10-CM | POA: Diagnosis not present

## 2020-07-17 DIAGNOSIS — Z79899 Other long term (current) drug therapy: Secondary | ICD-10-CM | POA: Diagnosis not present

## 2020-07-17 DIAGNOSIS — C8338 Diffuse large B-cell lymphoma, lymph nodes of multiple sites: Secondary | ICD-10-CM | POA: Diagnosis not present

## 2020-07-17 DIAGNOSIS — Z7984 Long term (current) use of oral hypoglycemic drugs: Secondary | ICD-10-CM | POA: Diagnosis not present

## 2020-07-17 DIAGNOSIS — Z9221 Personal history of antineoplastic chemotherapy: Secondary | ICD-10-CM | POA: Diagnosis not present

## 2020-07-17 DIAGNOSIS — E1122 Type 2 diabetes mellitus with diabetic chronic kidney disease: Secondary | ICD-10-CM | POA: Diagnosis not present

## 2020-07-17 DIAGNOSIS — N183 Chronic kidney disease, stage 3 unspecified: Secondary | ICD-10-CM | POA: Insufficient documentation

## 2020-07-17 DIAGNOSIS — R030 Elevated blood-pressure reading, without diagnosis of hypertension: Secondary | ICD-10-CM | POA: Insufficient documentation

## 2020-07-17 DIAGNOSIS — Z85118 Personal history of other malignant neoplasm of bronchus and lung: Secondary | ICD-10-CM | POA: Diagnosis not present

## 2020-07-17 DIAGNOSIS — Z794 Long term (current) use of insulin: Secondary | ICD-10-CM | POA: Diagnosis not present

## 2020-07-17 LAB — COMPREHENSIVE METABOLIC PANEL
ALT: 21 U/L (ref 0–44)
AST: 23 U/L (ref 15–41)
Albumin: 3.9 g/dL (ref 3.5–5.0)
Alkaline Phosphatase: 77 U/L (ref 38–126)
Anion gap: 8 (ref 5–15)
BUN: 26 mg/dL — ABNORMAL HIGH (ref 8–23)
CO2: 29 mmol/L (ref 22–32)
Calcium: 9.4 mg/dL (ref 8.9–10.3)
Chloride: 102 mmol/L (ref 98–111)
Creatinine, Ser: 2.31 mg/dL — ABNORMAL HIGH (ref 0.61–1.24)
GFR, Estimated: 29 mL/min — ABNORMAL LOW (ref >60)
Glucose, Bld: 111 mg/dL — ABNORMAL HIGH (ref 70–99)
Potassium: 4 mmol/L (ref 3.5–5.1)
Sodium: 139 mmol/L (ref 135–145)
Total Bilirubin: 0.7 mg/dL (ref 0.3–1.2)
Total Protein: 7.2 g/dL (ref 6.5–8.1)

## 2020-07-17 LAB — CBC WITH DIFFERENTIAL/PLATELET
Abs Immature Granulocytes: 0.03 10*3/uL (ref 0.00–0.07)
Basophils Absolute: 0.1 10*3/uL (ref 0.0–0.1)
Basophils Relative: 1 %
Eosinophils Absolute: 0.1 10*3/uL (ref 0.0–0.5)
Eosinophils Relative: 1 %
HCT: 44.1 % (ref 39.0–52.0)
Hemoglobin: 14.9 g/dL (ref 13.0–17.0)
Immature Granulocytes: 0 %
Lymphocytes Relative: 17 %
Lymphs Abs: 1.8 10*3/uL (ref 0.7–4.0)
MCH: 28.2 pg (ref 26.0–34.0)
MCHC: 33.8 g/dL (ref 30.0–36.0)
MCV: 83.5 fL (ref 80.0–100.0)
Monocytes Absolute: 0.7 10*3/uL (ref 0.1–1.0)
Monocytes Relative: 6 %
Neutro Abs: 8.3 10*3/uL — ABNORMAL HIGH (ref 1.7–7.7)
Neutrophils Relative %: 75 %
Platelets: 240 10*3/uL (ref 150–400)
RBC: 5.28 MIL/uL (ref 4.22–5.81)
RDW: 13.3 % (ref 11.5–15.5)
WBC: 11 10*3/uL — ABNORMAL HIGH (ref 4.0–10.5)
nRBC: 0 % (ref 0.0–0.2)

## 2020-07-17 NOTE — Assessment & Plan Note (Signed)
I have reviewed CT imaging with the patient The patient is almost 5 years out from his early stage I lung cancer He does not need further imaging study I will see him once a year

## 2020-07-17 NOTE — Assessment & Plan Note (Signed)
He has chronic kidney disease and this is unrelated to the lymphoma.  Continue close monitoring. he will continue current medical management. We discussed the importance of risk factor modification including weight loss and dietary change to control his diabetes better

## 2020-07-17 NOTE — Progress Notes (Signed)
[place] OFFICE PROGRESS NOTE  Patient Care Team: Harrison Mons, [place] as PCP - General (Family Medicine) Corliss Parish, MD as Consulting Physician (Nephrology) Fanny Skates, MD as Consulting Physician (General Surgery) Heath Lark, MD as Consulting Physician (Hematology and Oncology) Javier Glazier, MD as Consulting Physician (Pulmonary Disease) Juanita Craver, MD as Consulting Physician (Gastroenterology)  ASSESSMENT & PLAN:  Adenocarcinoma of lung, stage 1, left ([place]) I have reviewed CT imaging with the patient The patient is almost 5 years out from his early stage I lung cancer He does not need further imaging study I will see him once a year  Diffuse large B-cell lymphoma of lymph nodes of multiple regions [facility]) The patient is more than 5 years out from treatment for diffuse large B-cell lymphoma He does not need future imaging study I will see him once a year The patient is educated to watch out for signs and symptoms of cancer recurrence  History of kidney cancer He has early stage I kidney cancer status post resection I will defer to urologist for further follow-up  Chronic kidney disease, stage III (moderate) He has chronic kidney disease and this is unrelated to the [place].  Continue close monitoring. he will continue current medical management. We discussed the importance of risk factor modification including weight loss and dietary change to control his diabetes better   No orders of the defined types were placed in this encounter.   All questions were answered. The patient knows to call the clinic with any problems, questions or concerns. The total time spent in the appointment was 20 minutes encounter with patients including review of chart and various tests results, discussions about plan of care and coordination of care plan   Heath Lark, MD 07/17/2020 12:04 PM  INTERVAL HISTORY: Please see below for problem oriented  charting. He returns for further follow-up on history of lymphoma, kidney cancer and lung cancer He is doing well He denies recent smoking His blood sugar control is satisfactory but he has mild elevated blood pressure  SUMMARY OF ONCOLOGIC HISTORY: Oncology History Overview Note  Lymphoma   Staging form: Lymphoid Neoplasms, AJCC 6th Edition     Clinical stage from 11/21/2014: Stage IV - Signed by Heath Lark, MD on 11/21/2014     Diffuse large B-cell lymphoma of lymph nodes of multiple regions ([place])  11/13/2014 Imaging   PEt scan showed widespread disease including possible splenic involvement   11/19/2014 Surgery   He has excisional LN biopsy and port placement   11/19/2014 Pathology Results   Biopsy of lymph nodes showed DLBCL   11/29/2014 - 01/31/2015 Chemotherapy   He received 4 cycles of RCHOP chemotherapy complicated by severe infusion reaction.   12/19/2014 - 01/09/2015 Chemotherapy   He received 2 cycles of prophylactic IT chemotherapy.   01/31/2015 Adverse Reaction   I reduce the dose of his chemotherapy, prednisone and dexamethasone due to poorly controlled diabetes and hyperglycemia   02/12/2015 Imaging    repeat PET CT scan show complete response to chemotherapy but new lung infiltrates   09/09/2015 Imaging   PET CT showed complete response except for new inflammatory change in the lung   03/01/2016 Imaging   CT chest showed mild interval growth of subsolid left upper lobe pulmonary nodule, which was hypermetabolic on 82/42/3536 PET-CT. A primary bronchogenic adenocarcinoma cannot be excluded. Thoracic surgical consultation is advised. No lymphadenopathy or other findings of metastatic disease in the chest. Moderate emphysema with mild diffuse bronchial wall thickening,  suggesting COPD. Aortic atherosclerosis. Stable small pericardial effusion/thickening.   08/30/2016 PET scan   No evidence of hypermetabolic residual or recurrent lymphoma. 2. Interval left upper lobe wedge  resection for adenocarcinoma. Healing left rib fractures which are likely postsurgical. 3. Aortic atherosclerosis.   02/25/2017 PET scan   1. No abnormal nodal enlargement or hypermetabolic activity to suggest recurrent lymphoma. Postoperative findings in the left chest without findings of recurrent left lung cancer. 2. Aortic Atherosclerosis (ICD10-I70.0) and Emphysema (ICD10-J43.9).   12/31/2019 Imaging   1. No noncontrast MR abnormality of the abdomen to explain abdominal swelling or bloating.    2. There is a mixed solid and cystic mass of the inferior pole of the left kidney measuring approximately 3.3 x 2.6 x 2.5 cm. This was possibly present on prior CT dated 07/13/2018 although very difficult to appreciate given lack of IV contrast. Findings are highly suspicious for renal cell carcinoma; assessment is generally limited by lack of intravenous contrast.   Adenocarcinoma of lung, stage 1, left ([place])  03/24/2016 Procedure   He underwent electromagnetic navigational bronchoscopy with needle aspirations, brushings, and biopsies.   03/24/2016 Pathology Results   Accession: [id number] biopsy was positive for adenocarcinoma   04/15/2016 Initial Diagnosis   Adenocarcinoma of lung, stage 1, left ([place])   04/15/2016 Surgery   He underwent left video-assisted thoracoscopy, thoracoscopic left upper lobectomy, mediastinal lymph node dissection, on-Q local anesthetic catheter placement   04/15/2016 Pathology Results   Accession: [id number] surgical specimen revealed adenocarcinoma, 2.2 cm with negative margin and negative LN involvement   07/08/2017 Imaging   1. No evidence of local tumor recurrence in the left hemithorax status post left upper lobectomy. 2. No evidence of recurrent lymphoma. No adenopathy. Normal size spleen. 3. Solitary 5 mm solid pulmonary nodule in the superior segment right lower lobe, for which 16 month stability has been demonstrated, probably benign. 4. Subcentimeter  low-attenuation lateral segment left liver lobe lesion, for which 16 month stability has been demonstrated, probably benign. 5. No evidence of bowel obstruction or acute bowel inflammation. 6. Chronic findings include: Aortic Atherosclerosis (ICD10-I70.0) and Emphysema (ICD10-J43.9). Mild-to-moderate prostatomegaly. Small fat containing left inguinal hernia.   07/13/2018 Imaging   1. Status post left upper lobectomy, without recurrent or metastatic disease. 2. Right axillary node dissection, without evidence of active lymphoma. 3. Aortic atherosclerosis (ICD10-I70.0) and emphysema (ICD10-J43.9). 4. Right lower lobe pulmonary nodule is similar and can be presumed benign.   06/25/2019 Imaging   CXR 1. Postsurgical changes from prior left upper lobectomy. 2. Right basilar atelectatic changes. 3. No acute cardiopulmonary abnormality. 4. Mild gaseous distention of the stomach, nonspecific.   07/16/2020 Imaging   1. Status post left upper lobectomy. Stable examination. No findings to suggest local recurrence of disease or metastatic disease in the thorax. 2. Aortic atherosclerosis, in addition to right coronary artery disease. Assessment for potential risk factor modification, dietary therapy or pharmacologic therapy may be warranted, if clinically indicated. 3. Diffuse bronchial wall thickening with moderate centrilobular and mild paraseptal emphysema; imaging findings suggestive of underlying COPD.     REVIEW OF SYSTEMS:   Constitutional: Denies fevers, chills or abnormal weight loss Eyes: Denies blurriness of vision Ears, nose, mouth, throat, and face: Denies mucositis or sore throat Respiratory: Denies cough, dyspnea or wheezes Cardiovascular: Denies palpitation, chest discomfort or lower extremity swelling Gastrointestinal:  Denies nausea, heartburn or change in bowel habits Skin: Denies abnormal skin rashes Lymphatics: Denies new lymphadenopathy or easy bruising Neurological:Denies  numbness, tingling or new  weaknesses Behavioral/Psych: Mood is stable, no new changes  All other systems were reviewed with the patient and are negative.  I have reviewed the past medical history, past surgical history, social history and family history with the patient and they are unchanged from previous note.  ALLERGIES:  is allergic to ace inhibitors and rituximab.  MEDICATIONS:  Current Outpatient Medications  Medication Sig Dispense Refill  . amLODipine (NORVASC) 10 MG tablet TAKE ONE TABLET BY MOUTH ONCE DAILY (Patient taking differently: Take 10 mg by mouth daily. ) 90 tablet 3  . famotidine (PEPCID) 20 MG tablet Take 20 mg by mouth daily as needed for heartburn or indigestion.     Marland Kitchen glipiZIDE (GLUCOTROL XL) 10 MG 24 hr tablet TAKE ONE TABLET BY MOUTH ONCE DAILY (Patient taking differently: Take 10 mg by mouth daily. ) 90 tablet 3  . glucose blood (GLUCOSE METER TEST) test strip Use as instructed 100 each 3  . hydrochlorothiazide (HYDRODIURIL) 25 MG tablet Take 1 tablet (25 mg total) by mouth daily. 90 tablet 3  . Insulin Glargine (LANTUS) 100 UNIT/ML Solostar Pen Inject 10 Units into the skin daily at 10 pm. 15 mL 11  . Insulin Pen Needle 32G X 4 MM MISC Use to check home glucose as directed 100 each 3  . Lancets (ACCU-CHEK SOFT TOUCH) lancets Use as instructed 100 each 3  . losartan (COZAAR) 100 MG tablet Take 1 tablet (100 mg total) by mouth daily. 90 tablet 3  . metoprolol tartrate (LOPRESSOR) 25 MG tablet Take 1 tablet (25 mg total) by mouth 2 (two) times daily. 180 tablet 3  . pravastatin (PRAVACHOL) 40 MG tablet TAKE ONE TABLET BY MOUTH ONCE DAILY (Patient taking differently: Take 40 mg by mouth daily. ) 90 tablet 3  . sitaGLIPtin (JANUVIA) 50 MG tablet Take 1 tablet (50 mg total) by mouth daily. 30 tablet 12  . traMADol (ULTRAM) 50 MG tablet Take 1-2 tablets (50-100 mg total) by mouth every 6 (six) hours as needed for moderate pain or severe pain. 20 tablet 0   No current  facility-administered medications for this visit.    PHYSICAL EXAMINATION: ECOG PERFORMANCE STATUS: 0 - Asymptomatic  Vitals:   07/17/20 1057  BP: (!) 150/66  Pulse: 65  Resp: 18  Temp: 97.7 F (36.5 C)  SpO2: 100%   Filed Weights   07/17/20 1057  Weight: 168 lb 12.8 oz (76.6 kg)    GENERAL:alert, no distress and comfortable NEURO: alert & oriented x 3 with fluent speech, no focal motor/sensory deficits  LABORATORY DATA:  I have reviewed the data as listed    Component Value Date/Time   NA 139 07/17/2020 1033   NA 142 11/02/2017 0833   NA 141 06/17/2017 0938   K 4.0 07/17/2020 1033   K 4.1 06/17/2017 0938   CL 102 07/17/2020 1033   CO2 29 07/17/2020 1033   CO2 25 06/17/2017 0938   GLUCOSE 111 (H) 07/17/2020 1033   GLUCOSE 144 (H) 06/17/2017 0938   BUN 26 (H) 07/17/2020 1033   BUN 31 (H) 11/02/2017 0833   BUN 30.5 (H) 06/17/2017 0938   CREATININE 2.31 (H) 07/17/2020 1033   CREATININE 2.2 (H) 06/17/2017 0938   CALCIUM 9.4 07/17/2020 1033   CALCIUM 9.3 06/17/2017 0938   PROT 7.2 07/17/2020 1033   PROT 6.4 11/02/2017 0833   PROT 7.1 06/17/2017 0938   ALBUMIN 3.9 07/17/2020 1033   ALBUMIN 4.2 11/02/2017 0833   ALBUMIN 4.1 06/17/2017 1740  AST 23 07/17/2020 1033   AST 25 06/17/2017 0938   ALT 21 07/17/2020 1033   ALT 31 06/17/2017 0938   ALKPHOS 77 07/17/2020 1033   ALKPHOS 76 06/17/2017 0938   BILITOT 0.7 07/17/2020 1033   BILITOT 0.4 11/02/2017 0833   BILITOT 0.68 06/17/2017 0938   GFRNONAA 29 (L) 07/17/2020 1033   GFRNONAA 33 (L) 09/11/2013 0920   GFRAA 27 (L) 03/23/2020 0609   GFRAA 38 (L) 09/11/2013 0920    No results found for: SPEP, UPEP  Lab Results  Component Value Date   WBC 11.0 (H) 07/17/2020   NEUTROABS 8.3 (H) 07/17/2020   HGB 14.9 07/17/2020   HCT 44.1 07/17/2020   MCV 83.5 07/17/2020   PLT 240 07/17/2020      Chemistry      Component Value Date/Time   NA 139 07/17/2020 1033   NA 142 11/02/2017 0833   NA 141 06/17/2017 0938    K 4.0 07/17/2020 1033   K 4.1 06/17/2017 0938   CL 102 07/17/2020 1033   CO2 29 07/17/2020 1033   CO2 25 06/17/2017 0938   BUN 26 (H) 07/17/2020 1033   BUN 31 (H) 11/02/2017 0833   BUN 30.5 (H) 06/17/2017 0938   CREATININE 2.31 (H) 07/17/2020 1033   CREATININE 2.2 (H) 06/17/2017 0938   GLU 382 (H) 01/31/2015 1551      Component Value Date/Time   CALCIUM 9.4 07/17/2020 1033   CALCIUM 9.3 06/17/2017 0938   ALKPHOS 77 07/17/2020 1033   ALKPHOS 76 06/17/2017 0938   AST 23 07/17/2020 1033   AST 25 06/17/2017 0938   ALT 21 07/17/2020 1033   ALT 31 06/17/2017 0938   BILITOT 0.7 07/17/2020 1033   BILITOT 0.4 11/02/2017 0833   BILITOT 0.68 06/17/2017 0938       RADIOGRAPHIC STUDIES: I have reviewed multiple imaging studies with the patient I have personally reviewed the radiological images as listed and agreed with the findings in the report. CT CHEST WO CONTRAST  Result Date: 07/16/2020 CLINICAL DATA:  70 year old male with history of non-small cell lung cancer. Additional history of lymphoma. Chemotherapy complete July 2016. Follow-up study. EXAM: CT CHEST WITHOUT CONTRAST TECHNIQUE: Multidetector CT imaging of the chest was performed following the standard protocol without IV contrast. COMPARISON:  Chest CT 07/13/2018. FINDINGS: Cardiovascular: Heart size is normal. There is no significant pericardial fluid, thickening or pericardial calcification. There is aortic atherosclerosis, as well as atherosclerosis of the great vessels of the mediastinum and the coronary arteries, including calcified atherosclerotic plaque in the right coronary artery. Mediastinum/Nodes: No pathologically enlarged mediastinal or hilar lymph nodes. Please note that accurate exclusion of hilar adenopathy is limited on noncontrast CT scans. Esophagus is unremarkable in appearance. No axillary lymphadenopathy. Lungs/Pleura: Status post left upper lobectomy with compensatory hyperexpansion of the left lower lobe. No  suspicious appearing pulmonary nodules or masses are noted. No acute consolidative airspace disease. No pleural effusions. Diffuse bronchial wall thickening with moderate centrilobular and mild paraseptal emphysema. Upper Abdomen: Aortic atherosclerosis. Musculoskeletal: There are no aggressive appearing lytic or blastic lesions noted in the visualized portions of the skeleton. IMPRESSION: 1. Status post left upper lobectomy. Stable examination. No findings to suggest local recurrence of disease or metastatic disease in the thorax. 2. Aortic atherosclerosis, in addition to right coronary artery disease. Assessment for potential risk factor modification, dietary therapy or pharmacologic therapy may be warranted, if clinically indicated. 3. Diffuse bronchial wall thickening with moderate centrilobular and mild paraseptal emphysema; imaging  findings suggestive of underlying COPD. Aortic Atherosclerosis (ICD10-I70.0) and Emphysema (ICD10-J43.9). Electronically Signed   By: Vinnie Langton M.D.   On: 07/16/2020 09:29

## 2020-07-17 NOTE — Assessment & Plan Note (Signed)
He has early stage I kidney cancer status post resection I will defer to urologist for further follow-up

## 2020-07-17 NOTE — Assessment & Plan Note (Addendum)
The patient is more than 5 years out from treatment for diffuse large B-cell lymphoma He does not need future imaging study I will see him once a year The patient is educated to watch out for signs and symptoms of cancer recurrence

## 2020-12-10 ENCOUNTER — Other Ambulatory Visit: Payer: Self-pay | Admitting: Urology

## 2020-12-10 ENCOUNTER — Other Ambulatory Visit (HOSPITAL_COMMUNITY): Payer: Self-pay | Admitting: Urology

## 2020-12-10 DIAGNOSIS — D49512 Neoplasm of unspecified behavior of left kidney: Secondary | ICD-10-CM

## 2020-12-17 ENCOUNTER — Ambulatory Visit (HOSPITAL_COMMUNITY)
Admission: RE | Admit: 2020-12-17 | Discharge: 2020-12-17 | Disposition: A | Discharge status: Home / Self Care | Payer: Medicare Other | Source: Ambulatory Visit | Attending: Urology | Admitting: Urology

## 2020-12-17 ENCOUNTER — Other Ambulatory Visit: Payer: Self-pay

## 2020-12-17 DIAGNOSIS — D49512 Neoplasm of unspecified behavior of left kidney: Secondary | ICD-10-CM

## 2020-12-17 MED ORDER — GADOBUTROL 1 MMOL/ML IV SOLN
10.0000 mL | Freq: Once | INTRAVENOUS | Status: AC | PRN
  Administered 2020-12-17: 8 mL via INTRAVENOUS

## 2021-07-17 ENCOUNTER — Other Ambulatory Visit: Payer: Self-pay

## 2021-07-17 ENCOUNTER — Encounter: Payer: Self-pay | Admitting: Hematology and Oncology

## 2021-07-17 ENCOUNTER — Inpatient Hospital Stay: Payer: Medicare Other

## 2021-07-17 ENCOUNTER — Inpatient Hospital Stay: Payer: Medicare Other | Attending: Hematology and Oncology | Admitting: Hematology and Oncology

## 2021-07-17 DIAGNOSIS — Z85118 Personal history of other malignant neoplasm of bronchus and lung: Secondary | ICD-10-CM | POA: Diagnosis present

## 2021-07-17 DIAGNOSIS — C8338 Diffuse large B-cell lymphoma, lymph nodes of multiple sites: Secondary | ICD-10-CM

## 2021-07-17 DIAGNOSIS — C3492 Malignant neoplasm of unspecified part of left bronchus or lung: Secondary | ICD-10-CM

## 2021-07-17 DIAGNOSIS — Z8572 Personal history of non-Hodgkin lymphomas: Secondary | ICD-10-CM | POA: Insufficient documentation

## 2021-07-17 DIAGNOSIS — Z85528 Personal history of other malignant neoplasm of kidney: Secondary | ICD-10-CM | POA: Insufficient documentation

## 2021-07-17 LAB — CBC WITH DIFFERENTIAL (CANCER CENTER ONLY)
Abs Immature Granulocytes: 0.05 10*3/uL (ref 0.00–0.07)
Basophils Absolute: 0.1 10*3/uL (ref 0.0–0.1)
Basophils Relative: 1 %
Eosinophils Absolute: 0.1 10*3/uL (ref 0.0–0.5)
Eosinophils Relative: 1 %
HCT: 44.3 % (ref 39.0–52.0)
Hemoglobin: 14.9 g/dL (ref 13.0–17.0)
Immature Granulocytes: 1 %
Lymphocytes Relative: 19 %
Lymphs Abs: 2 10*3/uL (ref 0.7–4.0)
MCH: 28 pg (ref 26.0–34.0)
MCHC: 33.6 g/dL (ref 30.0–36.0)
MCV: 83.3 fL (ref 80.0–100.0)
Monocytes Absolute: 0.7 10*3/uL (ref 0.1–1.0)
Monocytes Relative: 6 %
Neutro Abs: 7.6 10*3/uL (ref 1.7–7.7)
Neutrophils Relative %: 72 %
Platelet Count: 237 10*3/uL (ref 150–400)
RBC: 5.32 MIL/uL (ref 4.22–5.81)
RDW: 13 % (ref 11.5–15.5)
WBC Count: 10.5 10*3/uL (ref 4.0–10.5)
nRBC: 0 % (ref 0.0–0.2)

## 2021-07-17 LAB — CMP (CANCER CENTER ONLY)
ALT: 21 U/L (ref 0–44)
AST: 23 U/L (ref 15–41)
Albumin: 4.4 g/dL (ref 3.5–5.0)
Alkaline Phosphatase: 81 U/L (ref 38–126)
Anion gap: 7 (ref 5–15)
BUN: 32 mg/dL — ABNORMAL HIGH (ref 8–23)
CO2: 31 mmol/L (ref 22–32)
Calcium: 9.5 mg/dL (ref 8.9–10.3)
Chloride: 101 mmol/L (ref 98–111)
Creatinine: 2.42 mg/dL — ABNORMAL HIGH (ref 0.61–1.24)
GFR, Estimated: 28 mL/min — ABNORMAL LOW (ref >60)
Glucose, Bld: 162 mg/dL — ABNORMAL HIGH (ref 70–99)
Potassium: 4 mmol/L (ref 3.5–5.1)
Sodium: 139 mmol/L (ref 135–145)
Total Bilirubin: 0.7 mg/dL (ref 0.3–1.2)
Total Protein: 7.3 g/dL (ref 6.5–8.1)

## 2021-07-17 NOTE — Assessment & Plan Note (Signed)
He has no clinical signs of cancer recurrence He does not need surveillance imaging study

## 2021-07-17 NOTE — Assessment & Plan Note (Signed)
He has early stage I kidney cancer status post resection I will defer to urologist for further follow-up

## 2021-07-17 NOTE — Assessment & Plan Note (Signed)
The patient is more than 5 years out from treatment for diffuse large B-cell lymphoma He does not need future imaging study I will see him once a year The patient is educated to watch out for signs and symptoms of cancer recurrence

## 2021-07-17 NOTE — Progress Notes (Signed)
[place] OFFICE PROGRESS NOTE  Patient Care Team: Harrison Mons, [place] as PCP - General (Family Medicine) Corliss Parish, MD as Consulting Physician (Nephrology) Fanny Skates, MD as Consulting Physician (General Surgery) Heath Lark, MD as Consulting Physician (Hematology and Oncology) Javier Glazier, MD as Consulting Physician (Pulmonary Disease) Juanita Craver, MD as Consulting Physician (Gastroenterology)  ASSESSMENT & PLAN:  Adenocarcinoma of lung, stage 1, left [facility]) He has no clinical signs of cancer recurrence He does not need surveillance imaging study  Diffuse large B-cell lymphoma of lymph nodes of multiple regions [facility]) The patient is more than 5 years out from treatment for diffuse large B-cell lymphoma He does not need future imaging study I will see him once a year The patient is educated to watch out for signs and symptoms of cancer recurrence  History of kidney cancer He has early stage I kidney cancer status post resection I will defer to urologist for further follow-up  Orders Placed This Encounter  Procedures   CBC with Differential ([place] Only)    Standing Status:   Future    Standing Expiration Date:   07/17/2022   CMP ([place] only)    Standing Status:   Future    Standing Expiration Date:   07/17/2022    All questions were answered. The patient knows to call the clinic with any problems, questions or concerns. The total time spent in the appointment was 20 minutes encounter with patients including review of chart and various tests results, discussions about plan of care and coordination of care plan   Heath Lark, MD 07/17/2021 12:51 PM  INTERVAL HISTORY: Please see below for problem oriented charting. he returns for surveillance follow-up for history of lymphoma, lung cancer and kidney cancer He is doing well No new lymphadenopathy Denies recent infection, fever or chills No recent cough, chest pain or  shortness of breath  REVIEW OF SYSTEMS:   Constitutional: Denies fevers, chills or abnormal weight loss Eyes: Denies blurriness of vision Ears, nose, mouth, throat, and face: Denies mucositis or sore throat Respiratory: Denies cough, dyspnea or wheezes Cardiovascular: Denies palpitation, chest discomfort or lower extremity swelling Gastrointestinal:  Denies nausea, heartburn or change in bowel habits Skin: Denies abnormal skin rashes Lymphatics: Denies new lymphadenopathy or easy bruising Neurological:Denies numbness, tingling or new weaknesses Behavioral/Psych: Mood is stable, no new changes  All other systems were reviewed with the patient and are negative.  I have reviewed the past medical history, past surgical history, social history and family history with the patient and they are unchanged from previous note.  ALLERGIES:  is allergic to ace inhibitors and rituximab.  MEDICATIONS:  Current Outpatient Medications  Medication Sig Dispense Refill   amLODipine (NORVASC) 10 MG tablet TAKE ONE TABLET BY MOUTH ONCE DAILY (Patient taking differently: Take 10 mg by mouth daily. ) 90 tablet 3   famotidine (PEPCID) 20 MG tablet Take 20 mg by mouth daily as needed for heartburn or indigestion.      glipiZIDE (GLUCOTROL XL) 10 MG 24 hr tablet TAKE ONE TABLET BY MOUTH ONCE DAILY (Patient taking differently: Take 10 mg by mouth daily. ) 90 tablet 3   glucose blood (GLUCOSE METER TEST) test strip Use as instructed 100 each 3   hydrochlorothiazide (HYDRODIURIL) 25 MG tablet Take 1 tablet (25 mg total) by mouth daily. 90 tablet 3   Insulin Glargine (LANTUS) 100 UNIT/ML Solostar Pen Inject 10 Units into the skin daily at 10 pm. 15 mL 11  Insulin Pen Needle 32G X 4 MM MISC Use to check home glucose as directed 100 each 3   Lancets (ACCU-CHEK SOFT TOUCH) lancets Use as instructed 100 each 3   losartan (COZAAR) 100 MG tablet Take 1 tablet (100 mg total) by mouth daily. 90 tablet 3   metoprolol  tartrate (LOPRESSOR) 25 MG tablet Take 1 tablet (25 mg total) by mouth 2 (two) times daily. 180 tablet 3   pravastatin (PRAVACHOL) 40 MG tablet TAKE ONE TABLET BY MOUTH ONCE DAILY (Patient taking differently: Take 40 mg by mouth daily. ) 90 tablet 3   sitaGLIPtin (JANUVIA) 50 MG tablet Take 1 tablet (50 mg total) by mouth daily. 30 tablet 12   traMADol (ULTRAM) 50 MG tablet Take 1-2 tablets (50-100 mg total) by mouth every 6 (six) hours as needed for moderate pain or severe pain. 20 tablet 0   No current facility-administered medications for this visit.    SUMMARY OF ONCOLOGIC HISTORY: Oncology History Overview Note  Lymphoma   Staging form: Lymphoid Neoplasms, AJCC 6th Edition     Clinical stage from 11/21/2014: Stage IV - Signed by Heath Lark, MD on 11/21/2014     Diffuse large B-cell lymphoma of lymph nodes of multiple regions ([place])  11/13/2014 Imaging   PEt scan showed widespread disease including possible splenic involvement   11/19/2014 Surgery   He has excisional LN biopsy and port placement   11/19/2014 Pathology Results   Biopsy of lymph nodes showed DLBCL   11/29/2014 - 01/31/2015 Chemotherapy   He received 4 cycles of RCHOP chemotherapy complicated by severe infusion reaction.   12/19/2014 - 01/09/2015 Chemotherapy   He received 2 cycles of prophylactic IT chemotherapy.   01/31/2015 Adverse Reaction   I reduce the dose of his chemotherapy, prednisone and dexamethasone due to poorly controlled diabetes and hyperglycemia   02/12/2015 Imaging    repeat PET CT scan show complete response to chemotherapy but new lung infiltrates   09/09/2015 Imaging   PET CT showed complete response except for new inflammatory change in the lung   03/01/2016 Imaging   CT chest showed mild interval growth of subsolid left upper lobe pulmonary nodule, which was hypermetabolic on 86/76/1950 PET-CT. A primary bronchogenic adenocarcinoma cannot be excluded. Thoracic surgical consultation is advised. No  lymphadenopathy or other findings of metastatic disease in the chest. Moderate emphysema with mild diffuse bronchial wall thickening, suggesting COPD. Aortic atherosclerosis. Stable small pericardial effusion/thickening.   08/30/2016 PET scan   No evidence of hypermetabolic residual or recurrent lymphoma. 2. Interval left upper lobe wedge resection for adenocarcinoma. Healing left rib fractures which are likely postsurgical. 3.  Aortic atherosclerosis.   02/25/2017 PET scan   1. No abnormal nodal enlargement or hypermetabolic activity to suggest recurrent lymphoma. Postoperative findings in the left chest without findings of recurrent left lung cancer. 2. Aortic Atherosclerosis (ICD10-I70.0) and Emphysema (ICD10-J43.9).   12/31/2019 Imaging   1. No noncontrast MR abnormality of the abdomen to explain abdominal swelling or bloating.    2. There is a mixed solid and cystic mass of the inferior pole of the left kidney measuring approximately 3.3 x 2.6 x 2.5 cm. This was possibly present on prior CT dated 07/13/2018 although very difficult to appreciate given lack of IV contrast. Findings are highly suspicious for renal cell carcinoma; assessment is generally limited by lack of intravenous contrast.   Adenocarcinoma of lung, stage 1, left ([place])  03/24/2016 Procedure   He underwent electromagnetic navigational bronchoscopy with needle aspirations,  brushings, and biopsies.   03/24/2016 Pathology Results   Accession: [id number] biopsy was positive for adenocarcinoma   04/15/2016 Initial Diagnosis   Adenocarcinoma of lung, stage 1, left ([facility])   04/15/2016 Surgery   He underwent left video-assisted thoracoscopy, thoracoscopic left upper lobectomy, mediastinal lymph node dissection, on-Q local anesthetic catheter placement   04/15/2016 Pathology Results   Accession: [id number] surgical specimen revealed adenocarcinoma, 2.2 cm with negative margin and negative LN involvement   07/08/2017 Imaging   1.  No evidence of local tumor recurrence in the left hemithorax status post left upper lobectomy. 2. No evidence of recurrent lymphoma. No adenopathy. Normal size spleen. 3. Solitary 5 mm solid pulmonary nodule in the superior segment right lower lobe, for which 16 month stability has been demonstrated, probably benign. 4. Subcentimeter low-attenuation lateral segment left liver lobe lesion, for which 16 month stability has been demonstrated, probably benign. 5. No evidence of bowel obstruction or acute bowel inflammation. 6. Chronic findings include: Aortic Atherosclerosis (ICD10-I70.0) and Emphysema (ICD10-J43.9). Mild-to-moderate prostatomegaly. Small fat containing left inguinal hernia.   07/13/2018 Imaging   1. Status post left upper lobectomy, without recurrent or metastatic disease. 2. Right axillary node dissection, without evidence of active lymphoma. 3. Aortic atherosclerosis (ICD10-I70.0) and emphysema (ICD10-J43.9). 4. Right lower lobe pulmonary nodule is similar and can be presumed benign.   06/25/2019 Imaging   CXR 1. Postsurgical changes from prior left upper lobectomy. 2. Right basilar atelectatic changes. 3. No acute cardiopulmonary abnormality. 4. Mild gaseous distention of the stomach, nonspecific.   07/16/2020 Imaging   1. Status post left upper lobectomy. Stable examination. No findings to suggest local recurrence of disease or metastatic disease in the thorax. 2. Aortic atherosclerosis, in addition to right coronary artery disease. Assessment for potential risk factor modification, dietary therapy or pharmacologic therapy may be warranted, if clinically indicated. 3. Diffuse bronchial wall thickening with moderate centrilobular and mild paraseptal emphysema; imaging findings suggestive of underlying COPD.     PHYSICAL EXAMINATION: ECOG PERFORMANCE STATUS: 0 - Asymptomatic  Vitals:   07/17/21 0934  BP: (!) 151/70  Pulse: 63  Resp: 18  Temp: 98.4 F (36.9 C)  SpO2:  100%   Filed Weights   07/17/21 0934  Weight: 166 lb 6.4 oz (75.5 kg)    GENERAL:alert, no distress and comfortable SKIN: skin color, texture, turgor are normal, no rashes or significant lesions EYES: normal, Conjunctiva are pink and non-injected, sclera clear OROPHARYNX:no exudate, no erythema and lips, buccal mucosa, and tongue normal  NECK: supple, thyroid normal size, non-tender, without nodularity LYMPH:  no palpable lymphadenopathy in the cervical, axillary or inguinal LUNGS: clear to auscultation and percussion with normal breathing effort HEART: regular rate & rhythm and no murmurs and no lower extremity edema ABDOMEN:abdomen soft, non-tender and normal bowel sounds Musculoskeletal:no cyanosis of digits and no clubbing  NEURO: alert & oriented x 3 with fluent speech, no focal motor/sensory deficits  LABORATORY DATA:  I have reviewed the data as listed    Component Value Date/Time   NA 139 07/17/2021 0915   NA 142 11/02/2017 0833   NA 141 06/17/2017 0938   K 4.0 07/17/2021 0915   K 4.1 06/17/2017 0938   CL 101 07/17/2021 0915   CO2 31 07/17/2021 0915   CO2 25 06/17/2017 0938   GLUCOSE 162 (H) 07/17/2021 0915   GLUCOSE 144 (H) 06/17/2017 0938   BUN 32 (H) 07/17/2021 0915   BUN 31 (H) 11/02/2017 0833   BUN 30.5 (H) 06/17/2017 0623  CREATININE 2.42 (H) 07/17/2021 0915   CREATININE 2.2 (H) 06/17/2017 0938   CALCIUM 9.5 07/17/2021 0915   CALCIUM 9.3 06/17/2017 0938   PROT 7.3 07/17/2021 0915   PROT 6.4 11/02/2017 0833   PROT 7.1 06/17/2017 0938   ALBUMIN 4.4 07/17/2021 0915   ALBUMIN 4.2 11/02/2017 0833   ALBUMIN 4.1 06/17/2017 0938   AST 23 07/17/2021 0915   AST 25 06/17/2017 0938   ALT 21 07/17/2021 0915   ALT 31 06/17/2017 0938   ALKPHOS 81 07/17/2021 0915   ALKPHOS 76 06/17/2017 0938   BILITOT 0.7 07/17/2021 0915   BILITOT 0.68 06/17/2017 0938   GFRNONAA 28 (L) 07/17/2021 0915   GFRNONAA 33 (L) 09/11/2013 0920   GFRAA 27 (L) 03/23/2020 0609   GFRAA 38  (L) 09/11/2013 0920    No results found for: SPEP, UPEP  Lab Results  Component Value Date   WBC 10.5 07/17/2021   NEUTROABS 7.6 07/17/2021   HGB 14.9 07/17/2021   HCT 44.3 07/17/2021   MCV 83.3 07/17/2021   PLT 237 07/17/2021      Chemistry      Component Value Date/Time   NA 139 07/17/2021 0915   NA 142 11/02/2017 0833   NA 141 06/17/2017 0938   K 4.0 07/17/2021 0915   K 4.1 06/17/2017 0938   CL 101 07/17/2021 0915   CO2 31 07/17/2021 0915   CO2 25 06/17/2017 0938   BUN 32 (H) 07/17/2021 0915   BUN 31 (H) 11/02/2017 0833   BUN 30.5 (H) 06/17/2017 0938   CREATININE 2.42 (H) 07/17/2021 0915   CREATININE 2.2 (H) 06/17/2017 0938   GLU 382 (H) 01/31/2015 1551      Component Value Date/Time   CALCIUM 9.5 07/17/2021 0915   CALCIUM 9.3 06/17/2017 0938   ALKPHOS 81 07/17/2021 0915   ALKPHOS 76 06/17/2017 0938   AST 23 07/17/2021 0915   AST 25 06/17/2017 0938   ALT 21 07/17/2021 0915   ALT 31 06/17/2017 0938   BILITOT 0.7 07/17/2021 0915   BILITOT 0.68 06/17/2017 5449

## 2021-12-15 ENCOUNTER — Other Ambulatory Visit: Payer: Self-pay | Admitting: Urology

## 2021-12-15 DIAGNOSIS — D49512 Neoplasm of unspecified behavior of left kidney: Secondary | ICD-10-CM

## 2021-12-25 ENCOUNTER — Ambulatory Visit
Admission: RE | Admit: 2021-12-25 | Discharge: 2021-12-25 | Disposition: A | Discharge status: Home / Self Care | Payer: Medicare Other | Source: Ambulatory Visit | Attending: Urology | Admitting: Urology

## 2021-12-25 DIAGNOSIS — D49512 Neoplasm of unspecified behavior of left kidney: Secondary | ICD-10-CM

## 2021-12-25 MED ORDER — GADOBENATE DIMEGLUMINE 529 MG/ML IV SOLN
15.0000 mL | Freq: Once | INTRAVENOUS | Status: AC | PRN
  Administered 2021-12-25: 15 mL via INTRAVENOUS

## 2022-03-29 ENCOUNTER — Encounter: Payer: Self-pay | Admitting: Hematology and Oncology

## 2022-07-02 ENCOUNTER — Telehealth: Payer: Self-pay | Admitting: Hematology and Oncology

## 2022-07-02 NOTE — Telephone Encounter (Signed)
Rescheduled appointment per provider PAL. Patient is aware of the changes made to her upcoming appointment. 

## 2022-07-19 ENCOUNTER — Ambulatory Visit: Payer: Medicare Other | Admitting: Hematology and Oncology

## 2022-07-19 ENCOUNTER — Other Ambulatory Visit: Payer: Medicare Other

## 2022-07-27 ENCOUNTER — Inpatient Hospital Stay: Payer: Medicare Other | Attending: Hematology and Oncology

## 2022-07-27 ENCOUNTER — Inpatient Hospital Stay: Payer: Medicare Other | Admitting: Hematology and Oncology

## 2022-07-27 ENCOUNTER — Telehealth: Payer: Self-pay

## 2022-07-27 NOTE — Telephone Encounter (Signed)
Called regarding missed appt today. He had appt with PCP at the same time.  He said that he is feeling okay and does not want appt at this time. He will call the office back if needed.  FYI

## 2022-11-15 ENCOUNTER — Other Ambulatory Visit: Payer: Self-pay | Admitting: Urology

## 2022-11-15 ENCOUNTER — Encounter: Payer: Self-pay | Admitting: Hematology and Oncology

## 2022-11-15 DIAGNOSIS — C642 Malignant neoplasm of left kidney, except renal pelvis: Secondary | ICD-10-CM

## 2022-12-25 ENCOUNTER — Ambulatory Visit
Admission: RE | Admit: 2022-12-25 | Discharge: 2022-12-25 | Disposition: A | Discharge status: Home / Self Care | Payer: Medicare Other | Source: Ambulatory Visit | Attending: Urology | Admitting: Urology

## 2022-12-25 DIAGNOSIS — C642 Malignant neoplasm of left kidney, except renal pelvis: Secondary | ICD-10-CM

## 2022-12-25 MED ORDER — GADOPICLENOL 0.5 MMOL/ML IV SOLN
7.5000 mL | Freq: Once | INTRAVENOUS | Status: AC | PRN
  Administered 2022-12-25: 7.5 mL via INTRAVENOUS

## 2023-03-10 ENCOUNTER — Encounter: Payer: Self-pay | Admitting: Nephrology

## 2023-12-16 ENCOUNTER — Other Ambulatory Visit: Payer: Self-pay | Admitting: Urology

## 2023-12-16 DIAGNOSIS — D49512 Neoplasm of unspecified behavior of left kidney: Secondary | ICD-10-CM
